# Patient Record
Sex: Female | Born: 1935 | Race: Black or African American | Hispanic: No | Marital: Married | Smoking: Former smoker
Health system: Southern US, Community
[De-identification: ages and names within clinical notes are randomized; demographics above are authoritative.]

## PROBLEM LIST (undated history)

## (undated) DIAGNOSIS — Z9289 Personal history of other medical treatment: Secondary | ICD-10-CM

## (undated) DIAGNOSIS — E119 Type 2 diabetes mellitus without complications: Secondary | ICD-10-CM

## (undated) DIAGNOSIS — T7841XA Arthus phenomenon, initial encounter: Secondary | ICD-10-CM

## (undated) DIAGNOSIS — E039 Hypothyroidism, unspecified: Secondary | ICD-10-CM

## (undated) DIAGNOSIS — H9190 Unspecified hearing loss, unspecified ear: Secondary | ICD-10-CM

## (undated) DIAGNOSIS — M179 Osteoarthritis of knee, unspecified: Secondary | ICD-10-CM

## (undated) DIAGNOSIS — J45909 Unspecified asthma, uncomplicated: Secondary | ICD-10-CM

## (undated) DIAGNOSIS — I10 Essential (primary) hypertension: Secondary | ICD-10-CM

## (undated) DIAGNOSIS — I8390 Asymptomatic varicose veins of unspecified lower extremity: Secondary | ICD-10-CM

## (undated) DIAGNOSIS — M1990 Unspecified osteoarthritis, unspecified site: Secondary | ICD-10-CM

## (undated) DIAGNOSIS — N200 Calculus of kidney: Secondary | ICD-10-CM

## (undated) DIAGNOSIS — M1710 Unilateral primary osteoarthritis, unspecified knee: Secondary | ICD-10-CM

## (undated) DIAGNOSIS — E785 Hyperlipidemia, unspecified: Secondary | ICD-10-CM

## (undated) DIAGNOSIS — J309 Allergic rhinitis, unspecified: Secondary | ICD-10-CM

## (undated) HISTORY — PX: WISDOM TOOTH EXTRACTION: SHX21

## (undated) HISTORY — PX: CATARACT EXTRACTION: SUR2

## (undated) HISTORY — DX: Hypothyroidism, unspecified: E03.9

## (undated) HISTORY — PX: KIDNEY STONE SURGERY: SHX686

## (undated) HISTORY — DX: Hyperlipidemia, unspecified: E78.5

## (undated) HISTORY — DX: Unilateral primary osteoarthritis, unspecified knee: M17.10

## (undated) HISTORY — PX: COLONOSCOPY: SHX174

## (undated) HISTORY — PX: REPLACEMENT TOTAL KNEE: SUR1224

## (undated) HISTORY — DX: Unspecified asthma, uncomplicated: J45.909

## (undated) HISTORY — PX: TONSILLECTOMY: SUR1361

## (undated) HISTORY — PX: ABDOMINAL HYSTERECTOMY: SHX81

## (undated) HISTORY — DX: Essential (primary) hypertension: I10

## (undated) HISTORY — DX: Unspecified osteoarthritis, unspecified site: M19.90

## (undated) HISTORY — DX: Type 2 diabetes mellitus without complications: E11.9

## (undated) HISTORY — DX: Allergic rhinitis, unspecified: J30.9

## (undated) HISTORY — PX: APPENDECTOMY: SHX54

## (undated) HISTORY — DX: Osteoarthritis of knee, unspecified: M17.9

## (undated) HISTORY — DX: Calculus of kidney: N20.0

## (undated) HISTORY — DX: Arthus phenomenon, initial encounter: T78.41XA

---

## 1999-09-20 ENCOUNTER — Encounter: Payer: Self-pay | Admitting: Emergency Medicine

## 1999-09-20 ENCOUNTER — Emergency Department (HOSPITAL_COMMUNITY): Admission: EM | Admit: 1999-09-20 | Discharge: 1999-09-20 | Payer: Self-pay | Admitting: Emergency Medicine

## 2002-09-24 ENCOUNTER — Other Ambulatory Visit: Admission: RE | Admit: 2002-09-24 | Discharge: 2002-09-24 | Payer: Self-pay | Admitting: Internal Medicine

## 2002-11-05 ENCOUNTER — Ambulatory Visit (HOSPITAL_COMMUNITY): Admission: RE | Admit: 2002-11-05 | Discharge: 2002-11-05 | Payer: Self-pay | Admitting: Internal Medicine

## 2002-11-05 ENCOUNTER — Encounter: Payer: Self-pay | Admitting: Internal Medicine

## 2005-01-11 ENCOUNTER — Ambulatory Visit: Payer: Self-pay | Admitting: Internal Medicine

## 2005-02-26 ENCOUNTER — Ambulatory Visit: Payer: Self-pay | Admitting: Internal Medicine

## 2005-03-04 ENCOUNTER — Ambulatory Visit: Payer: Self-pay | Admitting: Internal Medicine

## 2005-04-01 ENCOUNTER — Ambulatory Visit: Payer: Self-pay | Admitting: Pulmonary Disease

## 2005-04-01 ENCOUNTER — Ambulatory Visit: Payer: Self-pay | Admitting: Internal Medicine

## 2005-05-04 ENCOUNTER — Ambulatory Visit: Payer: Self-pay | Admitting: Physical Medicine & Rehabilitation

## 2005-05-04 ENCOUNTER — Inpatient Hospital Stay (HOSPITAL_COMMUNITY): Admission: RE | Admit: 2005-05-04 | Discharge: 2005-05-07 | Payer: Self-pay | Admitting: Orthopedic Surgery

## 2005-09-17 ENCOUNTER — Ambulatory Visit: Payer: Self-pay | Admitting: Internal Medicine

## 2005-11-06 ENCOUNTER — Ambulatory Visit: Payer: Self-pay | Admitting: Family Medicine

## 2006-01-14 ENCOUNTER — Ambulatory Visit: Payer: Self-pay | Admitting: Internal Medicine

## 2006-01-21 ENCOUNTER — Ambulatory Visit: Payer: Self-pay | Admitting: Internal Medicine

## 2006-04-14 ENCOUNTER — Ambulatory Visit: Payer: Self-pay | Admitting: Internal Medicine

## 2006-07-21 ENCOUNTER — Ambulatory Visit: Payer: Self-pay | Admitting: Internal Medicine

## 2007-02-13 ENCOUNTER — Ambulatory Visit: Payer: Self-pay | Admitting: Internal Medicine

## 2007-02-28 ENCOUNTER — Encounter: Payer: Self-pay | Admitting: *Deleted

## 2007-02-28 DIAGNOSIS — E785 Hyperlipidemia, unspecified: Secondary | ICD-10-CM | POA: Insufficient documentation

## 2007-02-28 DIAGNOSIS — M1710 Unilateral primary osteoarthritis, unspecified knee: Secondary | ICD-10-CM | POA: Insufficient documentation

## 2007-02-28 DIAGNOSIS — E039 Hypothyroidism, unspecified: Secondary | ICD-10-CM | POA: Insufficient documentation

## 2007-02-28 DIAGNOSIS — IMO0002 Reserved for concepts with insufficient information to code with codable children: Secondary | ICD-10-CM | POA: Insufficient documentation

## 2007-02-28 DIAGNOSIS — E119 Type 2 diabetes mellitus without complications: Secondary | ICD-10-CM | POA: Insufficient documentation

## 2007-05-05 ENCOUNTER — Ambulatory Visit: Payer: Self-pay | Admitting: Internal Medicine

## 2007-05-05 DIAGNOSIS — T7841XA Arthus phenomenon, initial encounter: Secondary | ICD-10-CM | POA: Insufficient documentation

## 2007-05-05 DIAGNOSIS — I10 Essential (primary) hypertension: Secondary | ICD-10-CM

## 2007-05-05 LAB — CONVERTED CEMR LAB
ALT: 11 units/L (ref 0–35)
AST: 14 units/L (ref 0–37)
BUN: 21 mg/dL (ref 6–23)
CO2: 28 meq/L (ref 19–32)
Calcium: 9.6 mg/dL (ref 8.4–10.5)
Chloride: 103 meq/L (ref 96–112)
Cholesterol: 163 mg/dL (ref 0–200)
Creatinine, Ser: 0.9 mg/dL (ref 0.4–1.2)
GFR calc Af Amer: 79 mL/min
GFR calc non Af Amer: 65 mL/min
Glucose, Bld: 123 mg/dL — ABNORMAL HIGH (ref 70–99)
HDL: 44.4 mg/dL (ref >39.0)
Hgb A1c MFr Bld: 7 % — ABNORMAL HIGH (ref 4.6–6.0)
LDL Cholesterol: 100 mg/dL — ABNORMAL HIGH (ref 0–99)
Potassium: 4.8 meq/L (ref 3.5–5.1)
Sodium: 140 meq/L (ref 135–145)
TSH: 0.54 microintl units/mL (ref 0.35–5.50)
Total CHOL/HDL Ratio: 3.7
Triglycerides: 92 mg/dL (ref 0–149)
VLDL: 18 mg/dL (ref 0–40)

## 2007-05-08 ENCOUNTER — Encounter: Payer: Self-pay | Admitting: Internal Medicine

## 2008-02-20 ENCOUNTER — Encounter: Payer: Self-pay | Admitting: Internal Medicine

## 2008-02-21 ENCOUNTER — Ambulatory Visit: Payer: Self-pay | Admitting: Internal Medicine

## 2008-02-21 LAB — CONVERTED CEMR LAB
ALT: 9 units/L (ref 0–35)
AST: 16 units/L (ref 0–37)
Albumin: 3.8 g/dL (ref 3.5–5.2)
Alkaline Phosphatase: 62 units/L (ref 39–117)
BUN: 14 mg/dL (ref 6–23)
Bilirubin, Direct: 0.2 mg/dL (ref 0.0–0.3)
CO2: 28 meq/L (ref 19–32)
Calcium: 9.3 mg/dL (ref 8.4–10.5)
Chloride: 110 meq/L (ref 96–112)
Cholesterol: 151 mg/dL (ref 0–200)
Creatinine, Ser: 0.8 mg/dL (ref 0.4–1.2)
GFR calc Af Amer: 90 mL/min
GFR calc non Af Amer: 75 mL/min
Glucose, Bld: 186 mg/dL — ABNORMAL HIGH (ref 70–99)
HDL: 45.2 mg/dL (ref >39.0)
Hgb A1c MFr Bld: 6.9 % — ABNORMAL HIGH (ref 4.6–6.0)
LDL Cholesterol: 88 mg/dL (ref 0–99)
Potassium: 4.7 meq/L (ref 3.5–5.1)
Sodium: 142 meq/L (ref 135–145)
TSH: 0.37 microintl units/mL (ref 0.35–5.50)
Total Bilirubin: 0.8 mg/dL (ref 0.3–1.2)
Total CHOL/HDL Ratio: 3.3
Total Protein: 7.1 g/dL (ref 6.0–8.3)
Triglycerides: 91 mg/dL (ref 0–149)
VLDL: 18 mg/dL (ref 0–40)

## 2008-02-22 ENCOUNTER — Encounter: Payer: Self-pay | Admitting: Internal Medicine

## 2008-05-07 ENCOUNTER — Inpatient Hospital Stay (HOSPITAL_COMMUNITY): Admission: RE | Admit: 2008-05-07 | Discharge: 2008-05-11 | Payer: Self-pay | Admitting: Orthopedic Surgery

## 2009-01-07 ENCOUNTER — Telehealth: Payer: Self-pay | Admitting: Internal Medicine

## 2009-01-09 ENCOUNTER — Ambulatory Visit: Payer: Self-pay | Admitting: Internal Medicine

## 2009-01-09 DIAGNOSIS — H9319 Tinnitus, unspecified ear: Secondary | ICD-10-CM | POA: Insufficient documentation

## 2009-01-10 ENCOUNTER — Telehealth: Payer: Self-pay | Admitting: Internal Medicine

## 2009-01-30 ENCOUNTER — Encounter: Payer: Self-pay | Admitting: Internal Medicine

## 2009-02-25 ENCOUNTER — Ambulatory Visit: Payer: Self-pay | Admitting: Internal Medicine

## 2009-02-25 LAB — CONVERTED CEMR LAB
ALT: 9 units/L (ref 0–35)
AST: 13 units/L (ref 0–37)
Albumin: 3.8 g/dL (ref 3.5–5.2)
Alkaline Phosphatase: 62 units/L (ref 39–117)
BUN: 18 mg/dL (ref 6–23)
Basophils Absolute: 0 10*3/uL (ref 0.0–0.1)
Basophils Relative: 0.4 % (ref 0.0–3.0)
Bilirubin, Direct: 0.1 mg/dL (ref 0.0–0.3)
CO2: 30 meq/L (ref 19–32)
Calcium: 9.3 mg/dL (ref 8.4–10.5)
Chloride: 107 meq/L (ref 96–112)
Cholesterol: 151 mg/dL (ref 0–200)
Creatinine, Ser: 0.8 mg/dL (ref 0.4–1.2)
Eosinophils Absolute: 0.3 10*3/uL (ref 0.0–0.7)
Eosinophils Relative: 3.6 % (ref 0.0–5.0)
GFR calc non Af Amer: 89.92 mL/min (ref >60)
Glucose, Bld: 131 mg/dL — ABNORMAL HIGH (ref 70–99)
HCT: 38.7 % (ref 36.0–46.0)
HDL: 49.9 mg/dL (ref >39.00)
Hemoglobin: 13 g/dL (ref 12.0–15.0)
Hgb A1c MFr Bld: 6.9 % — ABNORMAL HIGH (ref 4.6–6.5)
LDL Cholesterol: 88 mg/dL (ref 0–99)
Lymphocytes Relative: 36.1 % (ref 12.0–46.0)
Lymphs Abs: 2.8 10*3/uL (ref 0.7–4.0)
MCHC: 33.5 g/dL (ref 30.0–36.0)
MCV: 90.1 fL (ref 78.0–100.0)
Monocytes Absolute: 0.4 10*3/uL (ref 0.1–1.0)
Monocytes Relative: 5.6 % (ref 3.0–12.0)
Neutro Abs: 4.3 10*3/uL (ref 1.4–7.7)
Neutrophils Relative %: 54.3 % (ref 43.0–77.0)
Platelets: 279 10*3/uL (ref 150.0–400.0)
Potassium: 4.4 meq/L (ref 3.5–5.1)
RBC: 4.29 M/uL (ref 3.87–5.11)
RDW: 13.2 % (ref 11.5–14.6)
Sodium: 142 meq/L (ref 135–145)
TSH: 0.67 microintl units/mL (ref 0.35–5.50)
Total Bilirubin: 0.8 mg/dL (ref 0.3–1.2)
Total CHOL/HDL Ratio: 3
Total Protein: 7.5 g/dL (ref 6.0–8.3)
Triglycerides: 66 mg/dL (ref 0.0–149.0)
VLDL: 13.2 mg/dL (ref 0.0–40.0)
WBC: 7.8 10*3/uL (ref 4.5–10.5)

## 2009-02-26 ENCOUNTER — Encounter: Payer: Self-pay | Admitting: Internal Medicine

## 2009-05-12 ENCOUNTER — Encounter: Payer: Self-pay | Admitting: Internal Medicine

## 2009-06-02 ENCOUNTER — Encounter: Payer: Self-pay | Admitting: Internal Medicine

## 2009-09-03 ENCOUNTER — Encounter: Payer: Self-pay | Admitting: Internal Medicine

## 2010-03-02 ENCOUNTER — Ambulatory Visit: Payer: Self-pay | Admitting: Internal Medicine

## 2010-03-02 ENCOUNTER — Encounter: Payer: Self-pay | Admitting: Internal Medicine

## 2010-04-07 ENCOUNTER — Telehealth: Payer: Self-pay | Admitting: Internal Medicine

## 2010-04-09 ENCOUNTER — Ambulatory Visit: Payer: Self-pay | Admitting: Internal Medicine

## 2010-06-17 ENCOUNTER — Encounter: Payer: Self-pay | Admitting: Internal Medicine

## 2010-06-23 ENCOUNTER — Telehealth: Payer: Self-pay | Admitting: Internal Medicine

## 2010-06-28 LAB — CONVERTED CEMR LAB
ALT: 8 units/L (ref 0–35)
AST: 16 units/L (ref 0–37)
Albumin: 4.1 g/dL (ref 3.5–5.2)
Alkaline Phosphatase: 69 units/L (ref 39–117)
BUN: 16 mg/dL (ref 6–23)
Bilirubin, Direct: 0.1 mg/dL (ref 0.0–0.3)
CO2: 31 meq/L (ref 19–32)
Calcium: 9.6 mg/dL (ref 8.4–10.5)
Chloride: 101 meq/L (ref 96–112)
Cholesterol: 186 mg/dL (ref 0–200)
Creatinine, Ser: 0.8 mg/dL (ref 0.4–1.2)
GFR calc non Af Amer: 85.95 mL/min (ref >60)
Glucose, Bld: 113 mg/dL — ABNORMAL HIGH (ref 70–99)
HDL: 54.8 mg/dL (ref >39.00)
Hgb A1c MFr Bld: 7.3 % — ABNORMAL HIGH (ref 4.6–6.5)
LDL Cholesterol: 111 mg/dL — ABNORMAL HIGH (ref 0–99)
Potassium: 5.8 meq/L — ABNORMAL HIGH (ref 3.5–5.1)
Sodium: 140 meq/L (ref 135–145)
TSH: 0.39 microintl units/mL (ref 0.35–5.50)
Total Bilirubin: 0.9 mg/dL (ref 0.3–1.2)
Total CHOL/HDL Ratio: 3
Total Protein: 7.3 g/dL (ref 6.0–8.3)
Triglycerides: 100 mg/dL (ref 0.0–149.0)
VLDL: 20 mg/dL (ref 0.0–40.0)

## 2010-07-02 NOTE — Assessment & Plan Note (Signed)
Summary: YEARLY MEDICARE FU / LABS AFTER/NWS  #   Vital Signs:  Patient profile:   77 year old female Height:      66.5 inches Weight:      213 pounds BMI:     33.99 O2 Sat:      97 % on Room air Temp:     97.9 degrees F oral Pulse rate:   72 / minute BP sitting:   144 / 84  (left arm) Cuff size:   regular  Vitals Entered By: Bill Salinas CMA (March 02, 2010 11:31 AM)  O2 Flow:  Room air  Primary Care Provider:  Jacques Navy MD   History of Present Illness: Patient presents for annual wellness exam. she is feeling great with acceptance of the mild aches and pains that come with the territory. Not going to let this slow her down.   She had her mammogram this AM. she is do for labs.   She is very active currently spending a great deal of time with her new great-grandson. She remains totally independent in all ADLs. She manages all her own affairs including check writing and balancing her accounts as well as keeping up with all the grandchildren and two great-grandchildren.   Preventive Screening-Counseling & Management  Alcohol-Tobacco     Alcohol drinks/day: 0     Smoking Status: never  Caffeine-Diet-Exercise     Caffeine use/day: 1 cup per day     Does Patient Exercise: no  Hep-HIV-STD-Contraception     Dental Visit-last 6 months no     Sun Exposure-Excessive: no  Safety-Violence-Falls     Seat Belt Use: yes     Helmet Use: n/a     Firearms in the Home: firearms in the home     Smoke Detectors: yes     Violence in the Home: no risk noted     Sexual Abuse: no     Fall Risk: Low fall risk  Current Medications (verified): 1)  Glyburide 5 Mg  Tabs (Glyburide) .... Once Daily After Meal 2)  Synthroid 100 Mcg  Tabs (Levothyroxine Sodium) .... Once Daily 3)  Glucophage 850 Mg  Tabs (Metformin Hcl) .... Two Times A Day 4)  Pravastatin Sodium 40 Mg  Tabs (Pravastatin Sodium) .... At Bedtime 5)  Tylenol 325 Mg  Tabs (Acetaminophen) .... As Needed 6)  Tums 500 Mg   Chew (Calcium Carbonate Antacid) .... Four Times Daily  Allergies (verified): 1)  ! Pcn  Past History:  Past Medical History: Last updated: March 14, 2008 UNSPECIFIED ESSENTIAL HYPERTENSION (ICD-401.9) ARTHUS PHENOMENON (ICD-995.21) OSTEOARTHRITIS, KNEE (ICD-715.96) HYPERLIPIDEMIA (ICD-272.4) DIABETES MELLITUS, TYPE II (ICD-250.00) HYPOTHYROIDISM (ICD-244.9)      Physician roster:                  Ortho - Dr. 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 marriage in good health end-of-life: No cardiac resuscitation, no mechanical ventilation, no futile or heroic measures.   Risk Factors: Alcohol Use: 0 (03/02/2010) Caffeine Use: 1 cup per day (03/02/2010) Diet: heart healthy diet (02/25/2009) Exercise: no (03/02/2010)  Risk Factors: Smoking Status: never (03/02/2010)  Social History: Caffeine use/day:  1 cup per day Does Patient Exercise:  no Dental Care w/in 6 mos.:  no Sun Exposure-Excessive:  no Seat Belt Use:  yes Fall Risk:  Low fall risk  Review of Systems  The patient denies anorexia, fever, weight loss, weight gain, vision loss, decreased hearing, hoarseness, chest pain, dyspnea on exertion, peripheral edema, prolonged cough, hemoptysis, abdominal pain, hematochezia, severe indigestion/heartburn, incontinence, muscle weakness, difficulty walking, depression, abnormal bleeding, enlarged lymph nodes, and breast masses.    Physical Exam  General:  Overweight AA woman in no distress Head:  Normocephalic and atraumatic without  obvious abnormalities. No apparent alopecia or balding. Eyes:  vision grossly intact, pupils equal, pupils round, and corneas and lenses clear.  Fundiscopic exam deferred to Opthal. Ears:  External ear exam shows no significant lesions or deformities.  Otoscopic examination reveals clear canals, tympanic membranes are intact bilaterally without bulging, retraction, inflammation or discharge. Hearing is grossly normal bilaterally. Nose:  no external deformity and no external erythema.   Mouth:  missing many molars, premolars maxilla. No buccal lesions. Tongue normal Posterior pharynx normal Neck:  supple, full ROM, no thyromegaly, and no carotid bruits.   Chest Wall:  No deformities, masses, or tenderness noted. Breasts:  deferred to todays mammogram Lungs:  Normal respiratory effort, chest expands symmetrically. Lungs are clear to auscultation, no crackles or wheezes. Heart:  Normal rate and regular rhythm. S1 and S2 normal without gallop, murmur, click, rub or other extra sounds. Abdomen:  soft, non-tender, normal bowel sounds, no guarding, and no hepatomegaly.   Genitalia:  deferred Msk:  normal ROM, no joint tenderness, no joint swelling, no joint warmth, and no joint deformities.   Pulses:  2+ radial and DP pulses Extremities:  No clubbing, cyanosis, edema, or deformity noted with normal full range of motion of all joints.   Neurologic:  alert & oriented X3, cranial nerves II-XII intact, strength normal in all extremities, gait normal, and DTRs symmetrical and normal.   Skin:  turgor normal, color normal, no suspicious lesions, and no ulcerations.   Cervical Nodes:  no anterior cervical adenopathy and no posterior cervical adenopathy.   Psych:  Oriented X3, normally interactive, good eye contact, and not anxious appearing.    Diabetes Management Exam:    Eye Exam:       Eye Exam done elsewhere          Date: 07/16/2009          Results: normal          Done by: Dr.  Nelle Don   Impression & Recommendations:  Problem # 1:  UNSPECIFIED ESSENTIAL HYPERTENSION (ICD-401.9) Adequate control. For routine lab today.  Plan - continue preent regimen  BP today: 144/84 Prior BP: 132/72 (02/25/2009  Problem # 2:  HYPERLIPIDEMIA (ICD-272.4) For lab today with recommendations to follow.  Her updated medication list for this problem includes:    Pravastatin Sodium 40 Mg Tabs (Pravastatin sodium) .Marland Kitchen... At bedtime  Orders: TLB-Lipid Panel (80061-LIPID) TLB-Hepatic/Liver Function Pnl (80076-HEPATIC)  addendum - LDL 111, above goal of 100 or less. Plan - better dietary adherence to low fat diet  Problem # 3:  DIABETES MELLITUS, TYPE II (ICD-250.00)  Her updated medication list for this problem includes:    Glyburide 5 Mg Tabs (Glyburide) ..... Once daily after meal    Glucophage 850 Mg Tabs (Metformin hcl) .Marland Kitchen..Marland Kitchen Two times a day  Orders: TLB-BMP (Basic Metabolic Panel-BMET) (80048-METABOL)  TLB-A1C / Hgb A1C (Glycohemoglobin) (83036-A1C)  Labs Reviewed: Creat: 0.8 (02/25/2009)     Last Eye Exam: normal (07/16/2009) Reviewed HgBA1c results: 6.9 (02/25/2009)  6.9 (02/21/2008)  For follow up lab with recommendations to follow. She has been previously well controlled.  addendum - A1C 7.3% - OK   Problem # 4:  OSTEOARTHRITIS, KNEE (ICD-715.96) Has had orthopedic intervention left knee and remains tender. She is not limited in her activities.   The following medications were removed from the medication list:    Celebrex 100 Mg Caps (Celecoxib) .Marland Kitchen... Take one pill once each day Her updated medication list for this problem includes:    Tylenol 325 Mg Tabs (Acetaminophen) .Marland Kitchen... As needed  Problem # 5:  Preventive Health Care (ICD-V70.0)  Unremarkable history. Normal physical exam. Lab      Current with mammography, lost colonoscopy '04. Immunizations - Tetnus '10, Pneumonia vaccine '10, flu today.   Ms. Rudden has no signs of symptoms of depression. She  has normal cognition. She has minimal increased risk of fall due to OA knee but does exercise caution. She is counseled on weight management and continued adherence to medical regimen.  In summary -a very nice woman who is medically stable. She will return in 1 year or as needed.  Orders: Medicare -1st Annual Wellness Visit [phone])  Complete Medication List: 1)  Glyburide 5 Mg Tabs (Glyburide) .... Once daily after meal 2)  Synthroid 100 Mcg Tabs (Levothyroxine sodium) .... Once daily 3)  Glucophage 850 Mg Tabs (Metformin hcl) .... Two times a day 4)  Pravastatin Sodium 40 Mg Tabs (Pravastatin sodium) .... At bedtime 5)  Tylenol 325 Mg Tabs (Acetaminophen) .... As needed 6)  Tums 500 Mg Chew (Calcium carbonate antacid) .... Four times daily  Other Orders: TLB-TSH (Thyroid Stimulating Hormone) (60454-UJW)  Patient: Michelle Morton Note: All result statuses are Final unless otherwise noted.  Tests: (1) Lipid Panel (LIPID)   Cholesterol               186 mg/dL                   1-191     ATP III Classification            Desirable:  < 200 mg/dL                    Borderline High:  200 - 239 mg/dL               High:  > = 240 mg/dL   Triglycerides             100.0 mg/dL                 4.7-829.5     Normal:  <150 mg/dL     Borderline High:  621 - 199 mg/dL   HDL                       30.86 mg/dL                 >57.84   VLDL Cholesterol          20.0 mg/dL                  6.9-62.9   LDL Cholesterol      [H]  528 mg/dL                   4-13  CHO/HDL Ratio:  CHD Risk                             3                    Men          Women     1/2 Average Risk     3.4          3.3     Average Risk          5.0          4.4     2X Average Risk          9.6          7.1     3X Average Risk          15.0          11.0                           Tests: (2) Hepatic/Liver Function Panel (HEPATIC)   Total Bilirubin           0.9 mg/dL                   1.6-1.0   Direct Bilirubin          0.1  mg/dL                   9.6-0.4   Alkaline Phosphatase      69 U/L                      39-117   AST                       16 U/L                      0-37   ALT                       8 U/L                       0-35   Total Protein             7.3 g/dL                    5.4-0.9   Albumin                   4.1 g/dL                    8.1-1.9  Tests: (3) BMP (METABOL)   Sodium                    140 mEq/L                   135-145   Potassium            [H]  5.8 mEq/L                   3.5-5.1   Chloride                  101 mEq/L  96-112   Carbon Dioxide            31 mEq/L                    19-32   Glucose              [H]  113 mg/dL                   16-10   BUN                       16 mg/dL                    9-60   Creatinine                0.8 mg/dL                   4.5-4.0   Calcium                   9.6 mg/dL                   9.8-11.9   GFR                       85.95 mL/min                >60  Tests: (4) Hemoglobin A1C (A1C)   Hemoglobin A1C       [H]  7.3 %                       4.6-6.5     Glycemic Control Guidelines for People with Diabetes:     Non Diabetic:  <6%     Goal of Therapy: <7%     Additional Action Suggested:  >8%   Tests: (5) TSH (TSH)   FastTSH                   0.39 uIU/mL                 0.35-5.50

## 2010-07-02 NOTE — Progress Notes (Signed)
Summary: FOOT INJURY  Phone Note Call from Patient   Summary of Call: 2 wks ago pt hurt middle toe on right foot. C/o swelling and she soaked foot in epsom salts. She went to [facility] when she hit foot again. Xray was normal and they advised pt to continue to soak foot. She is worried b/c she is diabetic. Pt continues to c/o pain. She wants to know if she should see foot specialist? or Dr Debby Bud? Initial call taken by: Lamar Sprinkles, CMA,  April 07, 2010 4:08 PM  Follow-up for Phone Call        OV with me Follow-up by: Jacques Navy MD,  April 07, 2010 5:54 PM  Additional Follow-up for Phone Call Additional follow up Details #1::        appointment scheduled for 04/09/10 9:00am Additional Follow-up by: Brenton Grills CMA Duncan Dull),  April 08, 2010 10:06 AM

## 2010-07-02 NOTE — Assessment & Plan Note (Signed)
Summary: PER Michelle Morton SCHED PER SARAH-DATE--SWOLLEN TOE--STC   Vital Signs:  Patient profile:   77 year old female Height:      66.5 inches Weight:      216 pounds BMI:     34.47 O2 Sat:      97 % on Room air Temp:     97.9 degrees F oral Pulse rate:   90 / minute BP sitting:   130 / 72  (left arm) Cuff size:   regular  Vitals Entered By: Bill Salinas CMA (April 09, 2010 9:13 AM)  O2 Flow:  Room air CC: pt here with c/o soreness after hitting toe on her right foot/ ab   Primary Care Provider:  Jacques Navy MD  CC:  pt here with c/o soreness after hitting toe on her right foot/ ab.  History of Present Illness: Michelle Morton today for pain in her right middle toes after hitting it two times.  She states that about 3 weeks ago she hit her middle right toe when getting up from bed.  She was in severe pain and took some tylenol which eased the pain enough to go to sleep.  In the morning she awoke and noticed her toe was very swollen.  She soaked it in Epsom Salts whiched help decrease some of the swelling.  However, 4 days later she hit the same toe and again was in severe pain.  She went to Urgent Care that day.  X-ray was obtained and showed no break.  SHe was advised to buddy tape her toes and continue pain management with tylenol.  She said the buddy tape was very uncomfortable so she only occasionally did that at night.  Today the pain has almost subsided with tylenol but she still notices swelling and some pain when she walks.  She denies any redness around the toe, fevers, or chills.  She is worried that with her history of diabetes and how long the toe has been swollen that she could lose her foot.  Current Medications (verified): 1)  Glyburide 5 Mg  Tabs (Glyburide) .... Once Daily After Meal 2)  Synthroid 100 Mcg  Tabs (Levothyroxine Sodium) .... Once Daily 3)  Glucophage 850 Mg  Tabs (Metformin Hcl) .... Two Times A Day 4)  Pravastatin Sodium 40 Mg  Tabs (Pravastatin  Sodium) .... At Bedtime 5)  Tylenol 325 Mg  Tabs (Acetaminophen) .... As Needed 6)  Tums 500 Mg  Chew (Calcium Carbonate Antacid) .... Four Times Daily  Allergies (verified): 1)  ! Pcn PMH-FH-SH reviewed-no changes except otherwise noted  Review of Systems  The patient denies fever, chest pain, syncope, headaches, muscle weakness, difficulty walking, abnormal bleeding, and enlarged lymph nodes.    Physical Exam  General:  alert, well-developed, and well-nourished.   Head:  normocephalic and atraumatic.   Lungs:  normal respiratory effort, normal breath sounds, no crackles, and no wheezes.   Heart:  normal rate, regular rhythm, no murmur, no gallop, and no rub.   Msk:  normal ROM, no joint warmth, no redness over joints, no joint deformities, no joint instability, and no crepitation.  Tenderness and swelling around the middle toe on the right foot.  Pain with passive extension of toe. Pulses:  2+ DP and PT pulses bilaterally Extremities:  Swelling of the right middle toe.  Good capilary refill.  Normal sensation.  No open wound noted.  No ulcers. Neurologic:  alert & oriented X3, sensation intact to light touch, sensation intact  to pinprick, and gait normal.   Skin:  turgor normal and color normal.   Psych:  Oriented X3, memory intact for recent and remote, normally interactive, good eye contact, not anxious appearing, and not depressed appearing.     Impression & Recommendations:  Problem # 1:  TOE PAIN (ICD-729.5) With [name]'s history of trauma to the toe and the physical exam it seems as though she just stubbed her toe and has some swelling.  SHe does not have any signs of infection or gangreen that would cause her to have to loose the toe.    Plan:  Continue Tylenol for pain           Ice the toe for to help decrease swelling           Wear Rocker shoe when walking     Complete Medication List: 1)  Glyburide 5 Mg Tabs (Glyburide) .... Once daily after meal 2)   Synthroid 100 Mcg Tabs (Levothyroxine sodium) .... Once daily 3)  Glucophage 850 Mg Tabs (Metformin hcl) .... Two times a day 4)  Pravastatin Sodium 40 Mg Tabs (Pravastatin sodium) .... At bedtime 5)  Tylenol 325 Mg Tabs (Acetaminophen) .... As needed 6)  Tums 500 Mg Chew (Calcium carbonate antacid) .... Four times daily   Orders Added: 1)  Est. Patient Level III [16109]   Immunization History:  Influenza Immunization History:    Influenza:  historical (03/28/2010)   Immunization History:  Influenza Immunization History:    Influenza:  Historical (03/28/2010)

## 2010-07-02 NOTE — Letter (Signed)
Summary: [facility]   Imported By: Sherian Rein 06/25/2010 12:50:05  _____________________________________________________________________  External Attachment:    Type:   Image     Comment:   External Document

## 2010-07-02 NOTE — Letter (Signed)
Summary: [facility]  [facility]   Imported By: Sherian Rein 06/04/2009 14:19:23  _____________________________________________________________________  External Attachment:    Type:   Image     Comment:   External Document

## 2010-07-02 NOTE — Consult Note (Signed)
Summary: [facility]  [facility]   Imported By: Lester [place] 06/09/2009 09:28:57  _____________________________________________________________________  External Attachment:    Type:   Image     Comment:   External Document

## 2010-07-02 NOTE — Progress Notes (Signed)
       Diabetes Management Exam:    Eye Exam:       Eye Exam done elsewhere          Date: 06/17/2010          Results: normal          Done by: Dr Jearld Adjutant

## 2010-07-03 NOTE — Medication Information (Signed)
Summary: Pernell Dupre MD/[facility]  [place] MD/[facility]   Imported By: Lester [place] 09/05/2009 09:26:31  _____________________________________________________________________  External Attachment:    Type:   Image     Comment:   External Document

## 2010-10-13 NOTE — Op Note (Signed)
NAMEALECE, KOPPEL                  ACCOUNT NO.:  [id number]   MEDICAL RECORD NO.:  [id number]          PATIENT TYPE:  INP   LOCATION:  NA                           FACILITY:  [facility]   PHYSICIAN:  Deidre Ala, M.D.    DATE OF BIRTH:  [birth year]   DATE OF PROCEDURE:  05/07/2008  DATE OF DISCHARGE:                               OPERATIVE REPORT   PREOPERATIVE DIAGNOSIS:  End-stage degenerative joint disease, right  knee bone on bone with varus.   POSTOPERATIVE DIAGNOSIS:  End-stage degenerative joint disease, right  knee bone on bone with varus, with intraoperative compromise of the  medial collateral ligament.   PROCEDURE:  1. Right total knee arthroplasty using the DePuy TC3 femoral      component, femoral MBT stem cemented with rotating platform with      post.  2. Repair of medial collateral ligament.   SURGEON:  1. Charlesetta Shanks, M.D.   ASSISTANT:  Phineas Semen, PA-C.   ANESTHESIA:  General with endotracheal and femoral nerve block.   CULTURES:  None.   DRAINS:  2 medium Hemovacs to self suction.   TOURNIQUET TIME:  93 minutes.   ESTIMATED BLOOD LOSS:  Minimal.   COMPLICATIONS:  Intraoperative compromise of the MCL with primary repair  and change to TC3 femoral component to assure stability with result  maximal stability obtained.  TC3 without stem with full extension with  rock stable medial collateral ligament throughout range of motion.   PATHOLOGIC FINDINGS AND HISTORY:  Mirha underwent a successful left  total knee arthroplasty on May 04, 2005, approximately 3 years ago.  She had DePuy components LCS type with a short stem and had no problems  with it.  She is now with bone on bone with varus.  At surgery she had  marked tricompartmental disease.  We tried to match her components with  a standard right femur, a 12.5 rotating platform and #3 tibial tray, but  we did end up going with tibial MBT revision long stem because of her  relative high body mass  index and her varus producing a tendency to fall  into varus on the tibial component.  Granted the other side was doing  well, but the long tibial stem in my hands, has worked better for this.  In any case when checking the flexion gap, we fit a 10 tightly and I  wanted to go to a 12.5 to match the other side, the femur being equal.  When I made this cut, I realized I had come across the medial collateral  ligament.  I did tear repair with a series of #1 Ethibond sutures which  did stabilize it well; however, I felt her rehab would be compromised  and that she would do better getting the restraint of a TC3 for the  collateral ligaments and so went to that on the femoral side and  achieved rock-solid stability with both the repair and the TC3 with full  extension, flexion to 100 degrees with stability of the prosthesis  throughout with a 15-mm tibial component with  post.  I also used a size  4 MBT revision tray, a Sigma RP TC3  size 3 with the 15 mm poly and a 75  x 16 universal cemented stem.  Oval dome patella was three peg 35 mm.   PROCEDURE:  With adequate anesthesia obtained using femoral nerve block  and endotracheal technique, the patient was placed in the supine  position.  Right lower extremity was prepped from the toes to the  tourniquet in standard fashion.  After standard prepping and draping,  Esmarch examination was used.  The tourniquet was let up to 375 mmHg.  Median parapatellar skin incision was followed by median parapatellar  retinacular incision.  Incision was deepened sharply with a knife and  hemostasis obtained using Bovie electrocoagulator.  The patella was  everted, fat pad excised as well as both cruciates and the menisci.  She  was tighter laterally than medially and I did some medial release.  I  then exposed the proximal tibia and amputated the tibial spine.  I then  placed an intermedullary reaming guide down the canal with slight varus  to effect leg valgus  and reamed up to a 16.  I then placed the tibial  cutting jig in place and made the cut on the 6 level.  I then sized the  femur to a standard, placed intermedullary guide, placed the 10-mm C  clamp and set the rotation.  We then moved it up 2.5 mm so as not to  notch, made the anterior and posterior cuts and sized to a 10 which was  a bit tight.  I wanted to go up to 12.5, so I put the tibial cutting jig  in place 0 degrees, 2.5 mm down, made that cut, then trialed the 12.5  and felt the medial side to be loose.  I then looked and felt that the  medial collateral ligament had been compromised.  I placed 4 figure-of-  eight sutures in it and it balanced it back and tightened it for 15.  At  this point, the other side stretching out a bit so I ended up then  deciding to go to with a TC3 for extra stability and for quickness of  rehab instead of letting the MCL heal.  At this point we placed the  intermedullary guide reaming up to 16 for the femoral cutting jig distal  femoral cut, placed that off and cut the minimum distal femoral cut for  5 degrees of valgus.  We then placed the box cut for the anterior-  posterior cuts.  After we had recut the anterior-posterior cuts in  slight different rotation which also aided in flexion to loosen up  lateral and leave the medial posterior cut the same.  We then had made  the anterior-posterior chamfer cuts and the box cut on the femur.  I  then exposed the proximal tibia sized to 4, reamed the proximal reamer  and then the stem reamer and then placed the trial in place with the 15,  placed the femoral component on, placed the 15 mm insert with post and  the knee was in full extension with good varus-valgus stability  throughout the range of motion.  I tried the 12.5 felt it that to be  slightly loose so ended up with the 15 ultimately.  Even with that the  ligament stretched a bit more to have it be full extension at closure.  This was with the 15 mm  post with  insert.  All trial components removed.  While thorough jet lavage was carried out, components were brought on  the field to be checked for sizing.  We then cemented the tibial  component after assembling it, impacted it, removed excess cement.  We  then placed the femoral component, impacted it, removed excess cement.  We had previously with the patellar cutting jig,cut the patella down to  accommodate an 35 mm button with a rotating saw and placed those drill  holes.  We then placed the patella component and the 15 mm post.  The  knee was then articulated through a range of motion.  Excessive cement  was removed, the knee was held in full extension while the cement cured  on the patella,  femur and tibia.  When the cement had cured, the  tourniquet was let down.  Bleeding points were cauterized.  Hemovac  drains were placed in the medial and lateral gutter and brought out the  superior lateral portal.  The wound was then closed in layers with #1  Vicryl on the retinaculum with a running locking oversew of #1 PDS, 0  and 2-0 Vicryl in the subcu and skin staples.  We placed gentamicin in  the knee 40 mg in 20 mL of saline into the Hemovac and then clamped it  to let it sit in the knee until the Hemovac is charged in 2 or 3 hours  in the PACU.  Bulky sterile compressive dressing was applied with Ace.  The skin had  been closed with staples.  The patient having tolerated the procedure  well, was awakened, taken to recovery room in satisfactory condition for  routine postoperative care and CPM.      Deidre Ala, M.D.  Electronically Signed     VEP/MEDQ  D:  05/07/2008  T:  05/07/2008  Job:  [id number]   cc:   Rosalyn Gess. Norins, MD  [address]  [place]  [place] [address]

## 2010-10-13 NOTE — Assessment & Plan Note (Signed)
[facility]                           PRIMARY CARE OFFICE NOTE   NAME:Morton, Michelle LEGER                         MRN:          [id number]  DATE:02/13/2007                            DOB:          April 15, 1932    Michelle Morton is a 77 year old African-American woman who presents for  followup evaluation and exam.  She was last seen in the office July 21, 2006.  Last physical exam was January 21, 2006.   The patient reports she is feeling well with no active complaints at  this time.   PAST MEDICAL HISTORY:  1. Surgical tonsillectomy.  2. Appendectomy.  3. Hysterectomy in the 1980's.  4. Breast biopsy in 1988.  5. Vocal cord nodule excised in the 1970's.  6. Left total knee arthroplasty.   MEDICAL HISTORY:  1. Usual childhood disease.  2. Hayfever.  3. Hypothyroid disease.  4. History of nephrolithiasis with stent placement and passage of      stone.  5. History of breast abscesses treated with incision and drainage and      wound care.  6. Diabetes.  7. Hypothyroid disease.  8. Hyperlipidemia.   CURRENT MEDICATIONS:  1. Glyburide 5 mg daily.  2. Synthroid 100 mcg daily.  3. Glucophage 850 mg b.i.d.  4. Pravastatin 40 mg q.p.m.  5. Etodolac 400 mg b.i.d.  6. Advil p.r.n.   FAMILY HISTORY:  Significant for longevity, but no significant  inheritable disease.   SOCIAL HISTORY:  1. The patient is retired.  2. A public Geneticist, molecular.  3. She remains very active.  4. She is involved in the care of her grandchildren.  5. She exercises on a regular basis.  6. She recently took a cruise with her husband which was a Psychologist, sport and exercise.   CHART REVIEW:  1. Last colonoscopy was December 07, 2002, with a repeat study recommended      in 10 years.  2. Last hospitalization was May 04, 2005, for end-stage joint      disease of the left knee with a left total knee arthroplasty.  3. Last mammogram December 16, 2006, was unremarkable.   REVIEW OF  SYSTEMS:  Negative for any constitutional, cardiovascular,  respiratory, GI or GU complaints.   EXAMINATION:  Temperature was 97.4, blood pressure 148/79, pulse 92,  weight 221.  GENERAL APPEARANCE:  This is a heavyset African-American woman who looks  her stated age, in no acute distress.  HEENT EXAM:  Normocephalic, atraumatic, EACs and TMs were normal.  OROPHARYNX:  Without lesions.  CONJUNCTIVA AND SCLERA:  Clear.  PUPILS:  Equal, round and reactive.  NECK:  Supple without thyromegaly.  No S1 adenopathy was noted in the  cervical or supraclavicular regions.  CHEST:  No CVA tenderness.  LUNGS:  Clear to auscultation and percussion.  BREAST EXAM:  Skin was normal, nipples without discharge.  No fixed mass  lesions or abnormality was noted.  She has mild fibrocystic type  changes.  CARDIOVASCULAR:  Radial pulse 2+, no JVD or carotid bruit.  She had a  quiet precordium with a regular rate and rhythm without murmurs, rubs or  gallops.  ABDOMEN:  Soft, no guarding or rebound.  No organosplenomegaly was  noted.  PELVIC AND RECTAL EXAM:  Deferred to age and hysterectomy.  EXTREMITIES:  Without clubbing, cyanosis, edema or deformity.  NEUROLOGIC EXAM:  Nonfocal.   LABORATORY:  Labs were ordered and pending including a lipid panel, A1c,  metabolic panel, ALT, AST and TSH.   ASSESSMENT AND PLAN:  1. Diabetes.  The patient has generally been well controlled.  Last      hemoglobin A1c dates from 2006 and was 5.7%.  Laboratories ordered      and pending.  Will adjust her medications as indicated.  2. Hyperlipidemia.  Patient's last lipid panel from January 14, 2006,      showed an LDL of 102.  Laboratories were ordered and pending.  Will      adjust her medications as indicated.  3. Hypothyroid disease.  Last TSH was 0.98 January 14, 2006.      Laboratories were ordered and pending.  Will adjust her medications      as indicated.  4. Mild arthritic discomfort.  Patient stable on  etodolac.  5. Health maintenance.  Patient is current and up to date with      colorectal cancer screening and breast cancer screening.   In summary, this is a very pleasant woman who seems to be medically  stable at this time.  She will be notified by a phone tree of her lab  results with adjustment in her medication as indicated.     Rosalyn Gess Norins, MD  Electronically Signed    MEN/MedQ  DD: 02/14/2007  DT: 02/14/2007  Job #: [id number]   cc:   Lezlie Lye

## 2010-10-13 NOTE — Discharge Summary (Signed)
[name], Michelle Morton                  ACCOUNT NO.:  [id number]   MEDICAL RECORD NO.:  [id number]          PATIENT TYPE:  INP   LOCATION:  1608                         FACILITY:  [facility]   PHYSICIAN:  Deidre Ala, M.D.    DATE OF BIRTH:  August 21, 1931   DATE OF ADMISSION:  05/07/2008  DATE OF DISCHARGE:  05/11/2008                               DISCHARGE SUMMARY   FINAL DIAGNOSES:  1. End-stage degenerative joint disease, right knee.  2. Postoperative blood-loss anemia.  3. Diabetes mellitus type 2.  4. Hypothyroidism.  5. Hyperlipidemia.   PROCEDURE:  On 05/07/2008, total right knee arthroplasty.   SURGEON:  1. Charlesetta Shanks, M.D.   HISTORY:  This is a 77 year old African American female, long-time  patient of Dr. Renae Fickle, who has been treating her for her right DJD.  She  has since failed conservative management and is now ready to undergo a  total knee arthroplasty.  Subsequently, she has been admitted to [facility].   HOSPITAL COURSE:  Patient is admitted to [facility] on  May 07, 2008.  At that time, she underwent a total right knee  arthroplasty.  Patient tolerated the procedure well.  No intraoperative  complications occurred.   Postoperatively, the patient did well.  She did suffer from postop blood-  loss anemia and became significant enough to require blood transfusion.  On 05/10/2008, her hemoglobin was 7.3.  Hematocrit was 21.9.  At this  point, she was given 2 units of packed red cells.  Otherwise, no other  problems occurred during her stay.  Her pain was managed well.  She  worked with physical therapy.   By the 12th of December, she was ready to be discharged to home.  At  this time, she was able to get out of bed without assistance.  Her  incision was healing well.  There were no signs of any infections.  There was no sign of effusion.  Her peripheral pulses were intact.  Neuro was grossly intact.  No calf pain was noted.  No [name] sign  noted.   Patient was on Lovenox in the hospital.  She will continue on this as an  outpatient for the next 10 days.   DISCHARGE MEDICATIONS:  1. Lovenox 30 mg 1 subcu q.12h. x10 days.  2. Percocet 5/325 1-2 p.o. q.4-6h. p.r.n. pain.  3. Robaxin 750 1 p.o. q.i.d. p.r.n. spasms.  4. Metformin 850 mg 1 p.o. b.i.d.  5. Glyburide 1 p.o. daily.  6. Synthroid 100 mcg daily.  7. Pravastatin 40 mg nightly.   She did have a noted allergy to PENICILLIN, which caused a rash.   She is subsequently ready for discharge, and she will follow up with Dr.  Renae Fickle in 10 days.  She will receive home health for physical therapy.  She is now discharged in satisfactory and stable condition.      Phineas Semen, Wynonia Hazard, M.D.  Electronically Signed    CL/MEDQ  D:  05/11/2008  T:  05/11/2008  Job:  [id number] 

## 2010-10-16 NOTE — Discharge Summary (Signed)
NAMEROBINA, HAMOR                  ACCOUNT NO.:  [id number]   MEDICAL RECORD NO.:  [id number]          PATIENT TYPE:  INP   LOCATION:  1617                         FACILITY:  [facility]   PHYSICIAN:  Deidre Ala, M.D.    DATE OF BIRTH:  04/15/1932   DATE OF ADMISSION:  05/04/2005  DATE OF DISCHARGE:                                 DISCHARGE SUMMARY   ADMISSION DIAGNOSES:  1.  End-stage osteoarthritis of the left knee.  2.  Diabetes mellitus.  3.  Hypothyroidism.  4.  Hypercholesterolemia.  5.  History of allergies.   DISCHARGE DIAGNOSES:  1.  Osteoarthritis of the left knee, status post left total knee      arthroplasty.  2.  Diabetes mellitus.  3.  Hypothyroidism.  4.  Hypercholesterolemia.  5.  Allergies.  6.  Postoperative hemorrhagic anemia, stable at the time of discharge,      status post 2 units of packed red blood cells.   PROCEDURES:  The patient was admitted to [facility] and taken to  the operating room.  She underwent a left total knee arthroplasty, DePuy LCS  rotating platform, by Dr. Charlesetta Shanks, assisted by Clarene Reamer, PA-C.  Surgery was done under general anesthesia, with a femoral nerve block and  Hemovac drain x1 was placed at the time of surgery.   CONSULTATIONS:  Physical therapy, occupational therapy, social work case  management, physical medicine and rehabilitation with Dr. Thomasena Edis.   BRIEF HISTORY:  The patient is a 77 -year-old female with a history of left  knee pain.  She has previously failed conservative treatment up to this  point.  Radiographs in the office revealed bone-on-bone contact in the  medial compartment.  Upon these findings, Dr. Renae Fickle felt it was best to  proceed with a left total knee arthroplasty.  The patient agreed.  The risks  and benefits of the surgery were discussed with the patient and the patient  wished to proceed.   LABORATORY DATA:  CBC on admission showed at hemoglobin of 13.5, hematocrit  41.5, white blood  cell count 8.1, red blood cell count 4.52.  Serial H&H's  were followed throughout hospital stay.  Hemoglobin and hematocrit did  decline to 8.5 and 25 on May 06, 2005, however, were 11.2 and 33.5  status post 2 units of packed red blood cells and stable at the time of  discharge on December 8.  Coagulation studies on admission were all within  normal limits.  PT and INR at the time of discharge were 18.2 and 1.5  respectively, on Coumadin therapy. Routine chemistry on admission showed  glucose low at 59.  Serial chemistries were followed throughout hospital  stay.  Glucose ranged from a low of 59 on admission to a high of 174.  Creatinine was high at 1.3 on December 6 and returned to within normal range  the following day.  Sodium fell to 134 on December 7, but was back into the  normal range the following day.  Urinalysis on admission showed small amount  of bilirubin.  The  patient's blood type was AB positive, antibody screen  negative.  EKG on admission showed normal sinus rhythm with a normal EKG.  Preop chest x-ray revealed no active lung disease.   HOSPITAL COURSE:  The patient was admitted to [facility] and taken  to the operating room.  She underwent the above-stated procedure without  complications.  The patient tolerated the procedure well, and was taken to  the recovery room and the orthopedic floor for continued postoperative care.  Postoperative day #1, the patient was doing well, resting comfortably.  T-  max was 99.9.  Hemoglobin and hematocrit were 9.7 and 29.5.  She was  neurovascularly intact to the left lower extremity and dressing was clean,  dry and intact.  She was to work with physical therapy and occupational  therapy.  She had low urine output, so Lasix 20 mg IV was given, which  increased her urine output.  Postoperative day #2, the patient continued to  do well, with minimal pain.  T-max was 99.4, hemoglobin and hematocrit 8.5  and 25.  Incision  was clean, dry and intact.  She was neurovascularly intact  to the left lower extremity.  The patient was to continue working with  physical therapy and occupational therapy.  Hemovac, Foley and PCA were  discontinued on this day.  Her IV had come out, which was restarted to  transfuse 2 units of packed red blood cells.  She was to continue working  with physical therapy and then finally, on postoperative day #3, December 8,  patient was doing well, resting comfortably.  Hemoglobin and hematocrit were  11.2 and 33.5 status post 2 units of packed red blood cells.  She was  progressing well in therapy and incision was clean, dry and intact.  She was  neurovascularly intact to the left lower extremity.  She was going to work  with physical therapy again twice today to see how her progress did, for the  possibility of being discharged home today.   DISPOSITION:  The patient was discharged home on either December 8 or  May 08, 2005.   DISCHARGE MEDICATIONS:  1.  Mepergan Fortis 1-2 p.o. q.4-6h p.r.n. pain.  2.  Robaxin 500 mg 1 p.o. q.6-8h p.r.n. spasm.  3.  Coumadin per pharmacy protocol.   DIET:  The patient was to resume her previous diabetic diet.   ACTIVITY:  The patient was weightbearing as tolerated to the left lower  extremity.   WOUND CARE:  The patient is to perform daily dressing changes until no  drainage.  She may shower when no drainage.   FOLLOW UP:  The patient is to follow up with Dr. Renae Fickle 2 weeks from the date  of surgery.  She is to call the office for an appointment at [phone].   CONDITION ON DISCHARGE:  Stable and improved.     ______________________________  Clarene Reamer, P.A.-C.    ______________________________  V. Charlesetta Shanks, M.D.    SW/MEDQ  D:  05/07/2005  T:  05/07/2005  Job:  [id number]

## 2010-10-16 NOTE — Assessment & Plan Note (Signed)
[facility]                             PRIMARY CARE OFFICE NOTE   NAME:Bellot, ALESSIA GONSALEZ                         MRN:          [id number]  DATE:01/26/2006                            DOB:          05-25-1932    TELEPHONE NOTE:  Ms. Fedorchak was seen last week.  She was complaining at that  time of having a stain on her shorts and underwear.  She had a urinalysis at  that time which appeared to be a contaminated UA with multiple epithelial  cells and scant bacteria.   The patient called today to report she is having a greenish stain on the  front of her underwear when she wakes up in the morning.  She feels this is  a vaginal discharge.  She has had no significant odor and no pruritus.   ASSESSMENT AND PLAN:  Probable bacterial vaginosis.  Will treat it with  Flagyl 500 mg b.i.d. for 7 days.  Prescription called to [facility] on [address].                                   Rosalyn Gess Norins, MD   MEN/MedQ  DD:  01/26/2006  DT:  01/27/2006  Job #:  [id number]

## 2010-10-16 NOTE — Assessment & Plan Note (Signed)
[facility]                             PRIMARY CARE OFFICE NOTE   NAME:Lantz, MAKAIYAH SCHWEIGER                         MRN:          [id number]  DATE:01/21/2006                            DOB:          06-Aug-1931    HISTORY:  Ms. Ramstad is a very delightful 77 year old African/American female  who presents today for follow-up evaluation and examination.  She was last  seen in the office on November 06, 2005, by Dr. Arta Silence for shoulder  pain, at which time he increased her Etodolac to 400 mg b.i.d.  Unfortunately she still has some pain in her left shoulder and is worse with  elevation of her shoulder.  At this time she does not want any cortisone  injections nor a surgical consultation.   PAST MEDICAL HISTORY:  Is well-documented in my note of January 11, 2005,  without a change.   CURRENT MEDICATIONS:  1. Glyburide 5 mg daily.  2. Synthroid 100 mcg daily.  3. Glucophage 850 mg b.i.d.  4. Pravastatin 40 mg q.h.s.  5. Etodolac 400 mg b.i.d.  6. Advil p.r.n.  7. Tylenol p.r.n.  8. Tums p.r.n.   FAMILY HISTORY:  Unchanged.  Her sole surviving aunt is now 23 years old.  Will be 100 in January.   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  She is very active in her church and her community.  She does not  miss being at the [facility], although she does go by to see  her friends.   REVIEW OF SYSTEMS:  No fevers, sweats, chills or other constitutional  symptoms.  She saw her eye doctor in January 2007.  No cardiovascular,  respiratory or GI or genitourinary complaints.   PHYSICAL EXAMINATION:  VITAL SIGNS:  Temperature 98.1 degrees, blood  pressure 133/79, pulse 88, weight 222 pounds, height 5 feet 7 inches.  GENERAL:  This is an overweight African/American woman in no acute distress.  HEENT:  Normocephalic and atraumatic.  EAC's and tympanic membranes were  normal.  The patient has very poor  dentition with exposed roots, missing  many teeth, question of some mild gingivitis.  Posterior pharynx clear.  Conjunctivae and sclerae clear.  Pupils equal, round, reactive to light and  accommodation.  EOMI.  Funduscopic exam:  Suboptimal with a hand-held  instrument but she is seeing her ophthalmologist.  NECK:  Supple without organomegaly.  NODES:  No adenopathy was noted in the cervical or supraclavicular regions.  CHEST:  No CVA tenderness.  LUNGS:  Clear to auscultation and percussion.  BREASTS:  Pendulous with normal skin and nipples without discharge.  Has  fibrocystic-type changes.  No mixed mass, lesion or abnormalities.  No  axillary adenopathy.  CARDIOVASCULAR:  With 2+ radial pulses.  No jugular venous distention or  carotid bruits.  She had a quiet precordium with a regular rate and rhythm,  without murmurs, rubs or gallops.  ABDOMEN:  Soft, no guarding or rebound.  No organosplenomegaly was noted but  the exam was hindered  by her size.  PELVIC:  Deferred.  RECTAL:  Deferred.  EXTREMITIES:  Without clubbing, cyanosis or edema or deformity.  NEUROLOGIC:  The patient is awake, alert, oriented to person, place, time  and context.  Sensation was well-preserved to light touch and pinprick in  both feet.   DATA BASE:  A 12-lead electrocardiogram revealed a normal sinus rhythm with  no signs of injury or ischemia.   ASSESSMENT/PLAN:  1. Diabetes:  The patient's last hemoglobin A1c showed good control.  An      A1c was added to today's labs and she will be notified by phone.  The      patient is to continue on Glyburide and Glucophage as listed.  2. Hypothyroid disease:  The patient is stable with an adequate TSH of      0.98.  The plan is to continue her present medications.  3. Lipids:  The patient is almost at goal with an LDL of 102.  Will have      her continue on her present medications of Pravastatin 40 mg daily.      The patient needs to work harder on diet  management.  4. Health maintenance:  The patient did have a colonoscopy in 2004.  Is      scheduled for 2114.  The patient did have a mammogram in June 2007, and      is good for one year.  The patient is advised that she does need to see      her dentist and take care of her teeth, because of increased risk of      infection.   In summary, a very pleasant patient who seems to be medically stable at this  time.  She will be notified by phone of her hemoglobin A1c.  She does need  to see her dentist.                                   Rosalyn Gess. Norins, MD   MEN/MedQ  DD:  01/21/2006  DT:  01/21/2006  Job #:  [id number]   cc:   Patient

## 2010-10-16 NOTE — Op Note (Signed)
NAMEMEKIYAH, GLADWELL                  ACCOUNT NO.:  [id number]   MEDICAL RECORD NO.:  [id number]          PATIENT TYPE:  INP   LOCATION:  1617                         FACILITY:  [facility]   PHYSICIAN:  Deidre Ala, M.D.    DATE OF BIRTH:  Feb 28, 1932   DATE OF PROCEDURE:  05/04/2005  DATE OF DISCHARGE:                                 OPERATIVE REPORT   PREOPERATIVE DIAGNOSIS:  End-stage degenerative joint disease, left knee.   POSTOPERATIVE DIAGNOSIS:  End-stage degenerative joint disease, left knee.   PROCEDURE:  Left total knee arthroplasty using cemented DePuy components,  LCS type with rotating platform.   SURGEON:  1.  Charlesetta Shanks, M.D.   ASSISTANT:  Clarene Reamer, P.A.-C.   ANESTHESIA:  General endotracheal with femoral nerve block.   CULTURES:  None.   DRAINS:  Two medium Hemovacs to automatic.   ESTIMATED BLOOD LOSS:  100 cc.   REPLACEMENT:  Without.   TOURNIQUET TIME:  One hour, 11 minutes.   PATHOLOGIC FINDINGS/HISTORY:  Crickett is a 77 year old female patient of Dr.  Illene Regulus who has had persistent knee pain.  X-rays showed bone-on-bone  in the medial compartment.  We discussed various options, and she wanted to  proceed with surgical intervention.  We first saw her on January 13, 2005.  She had an acute flare-up on April 02, 2005.  We gave her a cortisone  shot, and she wanted to proceed with surgery thereafter.  At surgery today,  tricompartmental disease was noted.  She had excellent flexion/extension  stability and equivalency with flexion gap with a 12.5 rotating platform.  We used a standard left femur, a #3 tibial keel tray, and a 35 mm oval dome  patella, all cemented with two batches of cement, 750 mg of Zinacef per  batch.  We had full flexion and extension to 110, stable to varus and valgus  stressing.  We did do a lateral retinacular release for improved patellar  tilt and track.   With adequate anesthesia obtained using an endotracheal  technique, after  femoral nerve block, the patient was placed in the supine position.  On the  operating room table, the left lower extremity was prepped from the toes to  the tourniquet in a standard fashion.  After standard prepping and draping,  esmarch exsanguination was used.  The tourniquet was let up to 350 mmHg.  A  medium parapatellar skin incision was followed by a medium parapatellar  retinacular incision.  The incision was deep and sharp with a knife.  Hemostasis obtained using the Bovie electrocoagulator.  The patella was  everted.  Fat pad excised as well as the menisci and the cruciates.  Osteophytes were removed.  A slight medial release was carried out.  We then  exposed the proximal tibia and amputated the tibial spine.  The tibial  cutting jig was put in place, and we made our cut.  Then we sized to a  standard femur, placed the cutting jig in place.  Placed a 10 C clamp in  place, pinned it.  Made our anterior  and posterior cuts.  We then placed a 4  degrees valgus cutting jig in place.  Made that cut 5 mm less because her  bone was fairly spongy, her ligaments were stretching a bit.  She fit the 10  in flexion and extension at that point.  We then applied the  anterior/posterior chamfer and far-posterior cutting jig in place on the  femur.  Made those cuts.  Exposed the proximal tibia size to a 3.  Made the  central peg hole and broached for the keel.  We then placed a trial tibial  tray in place, a 10 placed on the femoral component and articulated the knee  through a range of motion.  At this point, a 12.5 was felt to be better with  coming to neutral in extension and flexion, so we switched to a 12.5.  The  patella was then cut down to a 14 from a 22.  We placed the 3 peg drill  guide, placed the 35 mm patella, and articulated the knee through a range of  motion.  We did do a lateral release.  All trial components were removed  while the knee was thoroughly  jet-lavaged, and the cement and components  were checked for sizing as they came onto the field.  The cement was mixed  with Zinacef in a cement gun.  We then exposed the proximal tibial, cemented  on the tibial component, packed it and removed excess cement.  We then  placed a rotating platform.  We then cemented on the femoral component,  impacted it, and removed excess cement.  Held the knee in full extension,  removed excess cement, then held the knee in 35 degrees of flexion while the  cement cured.  The patella was then put in place, clamped on, and held with  a clamp.  Excessive cement removed until the cement cured.  An additional  jet lavage was carried out.  (Note:  We did not use Ancef, we used  vancomycin IV prophylaxis due to a PENICILLIN allergy.  We did use Zinacef  in the cement).  We then articulated the knee through a range of motion.  Felt it to be appropriately stable.  The tourniquet was let down.  Bleeding  points were cauterized.  Hemovac drains were placed in the medial and  lateral portal and brought out the superolateral portal.  The Hemovac was  later hooked up to Autovac.  The wound was then closed in layers with #1  Vicryl interrupted on the retinaculum, #1 PDS running locking.  On the  retinaculum over-sew 0 and 2-0 Vicryl on the subcu, and skin staples.  A  bulky sterile compressive dressing is applied.  Hemovac hooked up to  Autovac.  Knee immobilizer placed.  Patient tolerated the procedure well and  was awakened, taken to the recovery room in satisfactory condition.  She  will be admitted for routine postoperative care and CPM and postoperative  analgesia.           ______________________________  V. Charlesetta Shanks, M.D.     VEP/MEDQ  D:  05/04/2005  T:  05/04/2005  Job:  [id number]   cc:   Rosalyn Gess. Norins, M.D. [facility]  [address]  [place]  [place] [address]

## 2010-12-30 ENCOUNTER — Other Ambulatory Visit: Payer: Self-pay | Admitting: Internal Medicine

## 2010-12-30 NOTE — Telephone Encounter (Signed)
Rx[s] Done. 

## 2011-01-28 ENCOUNTER — Other Ambulatory Visit: Payer: Self-pay | Admitting: Internal Medicine

## 2011-03-05 LAB — GLUCOSE, CAPILLARY
Glucose-Capillary: 112 mg/dL — ABNORMAL HIGH (ref 70–99)
Glucose-Capillary: 113 mg/dL — ABNORMAL HIGH (ref 70–99)
Glucose-Capillary: 120 mg/dL — ABNORMAL HIGH (ref 70–99)
Glucose-Capillary: 135 mg/dL — ABNORMAL HIGH (ref 70–99)
Glucose-Capillary: 144 mg/dL — ABNORMAL HIGH (ref 70–99)
Glucose-Capillary: 155 mg/dL — ABNORMAL HIGH (ref 70–99)
Glucose-Capillary: 236 mg/dL — ABNORMAL HIGH (ref 70–99)
Glucose-Capillary: 94 mg/dL (ref 70–99)

## 2011-03-05 LAB — URINE CULTURE
Colony Count: 80000
Special Requests: NEGATIVE

## 2011-03-05 LAB — BASIC METABOLIC PANEL
BUN: 16 mg/dL (ref 6–23)
CO2: 25 mEq/L (ref 19–32)
CO2: 27 mEq/L (ref 19–32)
Calcium: 8 mg/dL — ABNORMAL LOW (ref 8.4–10.5)
Calcium: 8.2 mg/dL — ABNORMAL LOW (ref 8.4–10.5)
Chloride: 103 mEq/L (ref 96–112)
Creatinine, Ser: 0.82 mg/dL (ref 0.4–1.2)
GFR calc Af Amer: >60 mL/min (ref >60)
Glucose, Bld: 231 mg/dL — ABNORMAL HIGH (ref 70–99)
Potassium: 4.5 mEq/L (ref 3.5–5.1)
Sodium: 131 mEq/L — ABNORMAL LOW (ref 135–145)

## 2011-03-05 LAB — PROTIME-INR: INR: 1 (ref 0.00–1.49)

## 2011-03-05 LAB — COMPREHENSIVE METABOLIC PANEL
Albumin: 3.7 g/dL (ref 3.5–5.2)
BUN: 12 mg/dL (ref 6–23)
Calcium: 9.4 mg/dL (ref 8.4–10.5)
Creatinine, Ser: 0.72 mg/dL (ref 0.4–1.2)
GFR calc Af Amer: >60 mL/min (ref >60)
Glucose, Bld: 114 mg/dL — ABNORMAL HIGH (ref 70–99)
Sodium: 138 mEq/L (ref 135–145)
Total Protein: 7.1 g/dL (ref 6.0–8.3)

## 2011-03-05 LAB — DIFFERENTIAL
Lymphocytes Relative: 29 % (ref 12–46)
Monocytes Absolute: 0.4 10*3/uL (ref 0.1–1.0)
Monocytes Relative: 6 % (ref 3–12)
Neutro Abs: 4.1 10*3/uL (ref 1.7–7.7)
Neutrophils Relative %: 57 % (ref 43–77)

## 2011-03-05 LAB — URINALYSIS, ROUTINE W REFLEX MICROSCOPIC
Bilirubin Urine: NEGATIVE
Glucose, UA: NEGATIVE mg/dL
Hgb urine dipstick: NEGATIVE
Protein, ur: NEGATIVE mg/dL
Specific Gravity, Urine: 1.015 (ref 1.005–1.030)
Urobilinogen, UA: 0.2 mg/dL (ref 0.0–1.0)

## 2011-03-05 LAB — CBC
HCT: 21.9 % — ABNORMAL LOW (ref 36.0–46.0)
HCT: 27.4 % — ABNORMAL LOW (ref 36.0–46.0)
Hemoglobin: 8.8 g/dL — ABNORMAL LOW (ref 12.0–15.0)
MCHC: 32.1 g/dL (ref 30.0–36.0)
MCHC: 32.2 g/dL (ref 30.0–36.0)
MCHC: 32.9 g/dL (ref 30.0–36.0)
MCV: 92.6 fL (ref 78.0–100.0)
MCV: 93.2 fL (ref 78.0–100.0)
Platelets: 200 10*3/uL (ref 150–400)
Platelets: 211 10*3/uL (ref 150–400)
RBC: 2.67 MIL/uL — ABNORMAL LOW (ref 3.87–5.11)
RBC: 2.92 MIL/uL — ABNORMAL LOW (ref 3.87–5.11)
RBC: 4.35 MIL/uL (ref 3.87–5.11)
RDW: 13.6 % (ref 11.5–15.5)
RDW: 14.2 % (ref 11.5–15.5)
WBC: 7.3 10*3/uL (ref 4.0–10.5)

## 2011-03-05 LAB — TYPE AND SCREEN: Antibody Screen: NEGATIVE

## 2011-03-05 LAB — HEMOGLOBIN AND HEMATOCRIT, BLOOD
HCT: 29.9 % — ABNORMAL LOW (ref 36.0–46.0)
Hemoglobin: 9.8 g/dL — ABNORMAL LOW (ref 12.0–15.0)

## 2011-03-05 LAB — APTT: aPTT: 37 seconds (ref 24–37)

## 2011-03-18 ENCOUNTER — Encounter: Payer: Self-pay | Admitting: Internal Medicine

## 2011-03-24 ENCOUNTER — Encounter: Payer: Self-pay | Admitting: Internal Medicine

## 2011-05-01 ENCOUNTER — Other Ambulatory Visit: Payer: Self-pay | Admitting: Internal Medicine

## 2011-05-10 ENCOUNTER — Encounter: Payer: Self-pay | Admitting: Internal Medicine

## 2011-05-11 ENCOUNTER — Ambulatory Visit (INDEPENDENT_AMBULATORY_CARE_PROVIDER_SITE_OTHER): Payer: 59 | Admitting: Internal Medicine

## 2011-05-11 ENCOUNTER — Encounter: Payer: Self-pay | Admitting: Internal Medicine

## 2011-05-11 ENCOUNTER — Other Ambulatory Visit (INDEPENDENT_AMBULATORY_CARE_PROVIDER_SITE_OTHER): Payer: 59

## 2011-05-11 VITALS — BP 140/80 | HR 86 | Temp 98.0°F | Resp 14 | Ht 64.0 in | Wt 208.2 lb

## 2011-05-11 DIAGNOSIS — E119 Type 2 diabetes mellitus without complications: Secondary | ICD-10-CM

## 2011-05-11 DIAGNOSIS — E785 Hyperlipidemia, unspecified: Secondary | ICD-10-CM

## 2011-05-11 DIAGNOSIS — E039 Hypothyroidism, unspecified: Secondary | ICD-10-CM

## 2011-05-11 DIAGNOSIS — Z23 Encounter for immunization: Secondary | ICD-10-CM

## 2011-05-11 DIAGNOSIS — I10 Essential (primary) hypertension: Secondary | ICD-10-CM

## 2011-05-11 DIAGNOSIS — M1710 Unilateral primary osteoarthritis, unspecified knee: Secondary | ICD-10-CM

## 2011-05-11 DIAGNOSIS — Z0000 Encounter for general adult medical examination without abnormal findings: Secondary | ICD-10-CM

## 2011-05-11 DIAGNOSIS — E669 Obesity, unspecified: Secondary | ICD-10-CM

## 2011-05-11 LAB — CBC WITH DIFFERENTIAL/PLATELET
Basophils Absolute: 0 10*3/uL (ref 0.0–0.1)
Hemoglobin: 13.1 g/dL (ref 12.0–15.0)
Lymphocytes Relative: 34.6 % (ref 12.0–46.0)
Monocytes Relative: 5.8 % (ref 3.0–12.0)
Neutro Abs: 4.7 10*3/uL (ref 1.4–7.7)
RDW: 13.9 % (ref 11.5–14.6)

## 2011-05-11 LAB — HEPATIC FUNCTION PANEL
Albumin: 3.9 g/dL (ref 3.5–5.2)
Alkaline Phosphatase: 63 U/L (ref 39–117)
Bilirubin, Direct: 0.1 mg/dL (ref 0.0–0.3)
Total Bilirubin: 0.6 mg/dL (ref 0.3–1.2)

## 2011-05-11 LAB — LIPID PANEL
LDL Cholesterol: 90 mg/dL (ref 0–99)
Total CHOL/HDL Ratio: 3

## 2011-05-11 LAB — COMPREHENSIVE METABOLIC PANEL
ALT: 10 U/L (ref 0–35)
BUN: 21 mg/dL (ref 6–23)
CO2: 27 mEq/L (ref 19–32)
Calcium: 9.2 mg/dL (ref 8.4–10.5)
Chloride: 105 mEq/L (ref 96–112)
Creatinine, Ser: 1 mg/dL (ref 0.4–1.2)
GFR: 70.73 mL/min (ref >60.00)

## 2011-05-11 LAB — HEMOGLOBIN A1C: Hgb A1c MFr Bld: 7.2 % — ABNORMAL HIGH (ref 4.6–6.5)

## 2011-05-11 NOTE — Progress Notes (Signed)
Subjective:    Patient ID: Michelle Morton, female    DOB: 03/27/1932, 77 y.o.   MRN: [id number]  HPI The patient is here for annual Medicare wellness examination and management of other chronic and acute problems.  She has been stressed- her husband had a CVA - 2 months in hospital or rehab. Her health has been fine. She does have occasional palpitation   The risk factors are reflected in the social history.  The roster of all physicians providing medical care to patient - is listed in the Snapshot section of the chart.  Activities of daily living:  The patient is 100% inedpendent in all ADLs: dressing, toileting, feeding as well as independent mobility  Home safety : The patient has smoke detectors in the home. They wear seatbelts. No firearms at home. There is no violence in the home.   There is no risks for hepatitis, STDs or HIV. There is no   history of blood transfusion. They have no travel history to infectious disease endemic areas of the world.  The patient has seen their dentist in the last six month. They have seen their eye doctor in the last year. They admit to any hearing difficulty, tinnitis, and have not had repeat audiologic testing in the last year.  They do not  have excessive sun exposure. Discussed the need for sun protection: hats, long sleeves and use of sunscreen if there is significant sun exposure. No falls or fall risk.  Diet: the importance of a healthy diet is discussed. They do have a healthy diet most of the time.  The patient has a regular exercise program: exercise class , 45 duration, 2 per week.  The benefits of regular aerobic exercise were discussed.  Depression screen: there are mild signs or vegative symptoms of depression- irritability, change in appetite, anhedonia, sadness/tearfullness. She has been able to work this out through the help of her church.  Cognitive assessment: the patient manages all their financial and personal affairs and is actively  engaged.   The following portions of the patient's history were reviewed and updated as appropriate: allergies, current medications, past family history, past medical history,  past surgical history, past social history  and problem list.  Vision, hearing, body mass index were assessed and reviewed.   During the course of the visit the patient was educated and counseled about appropriate screening and preventive services including : fall prevention , diabetes screening, nutrition counseling, colorectal cancer screening, and recommended immunizations.  Past Medical History  Diagnosis Date  . Hypertension   . Arthus phenomenon   . Osteoarthritis, knee   . Hyperlipidemia   . Diabetes mellitus, type 2   . Hypothyroidism    Past Surgical History  Procedure Date  . Abdominal hysterectomy   . Replacement total knee     left  knee '06  . Knee arthroplasty     left   Family History  Problem Relation Age of Onset  . Diabetes Father   . Hypertension Father   . Cancer Daughter     breast cancer survior   History   Social History  . Marital Status: Married    Spouse Name: N/A    Number of Children: N/A  . Years of Education: N/A   Occupational History  . Not on file.   Social History Main Topics  . Smoking status: Former Smoker    Quit date: 05/10/1992  . Smokeless tobacco: Never Used  . Alcohol Use: Not on file  .  Drug Use: Not on file  . Sexually Active: Not on file   Other Topics Concern  . Not on file   Social History Narrative   HSG, [employer]- 1 year nursing schoolMarried '562 sons, '64, '68, 2 daughters- '57, '59; grandchildren 5Retired [employer] 30 yearsMarriage in good healthEnd of life: no cardiac resuscitation, no mechanical ventilation, no futile or heroic measures.       Review of Systems Constitutional:  Negative for fever, chills, activity change and unexpected weight change.  HEENT:  Negative for hearing loss, ear pain, congestion, neck stiffness and postnasal  drip. Negative for sore throat or swallowing problems. Negative for dental complaints.   Eyes: Negative for vision loss or change in visual acuity.  Respiratory: Negative for chest tightness and wheezing. Negative for DOE.   Cardiovascular: Negative for chest pain or palpitations. No decreased exercise tolerance Gastrointestinal: No change in bowel habit. No bloating or gas. No reflux or indigestion Genitourinary: Negative for urgency, frequency, flank pain and difficulty urinating.  Musculoskeletal: Negative for myalgias, back pain, arthralgias and gait problem. Knee pain responds to NSAIDs Neurological: Negative for dizziness, tremors, weakness and headaches.  Hematological: Negative for adenopathy.  Psychiatric/Behavioral: Negative for behavioral problems and dysphoric mood.   Current outpatient prescriptions:ibuprofen (ADVIL,MOTRIN) 200 MG tablet, Take 200 mg by mouth every 6 (six) hours as needed.  , Disp: , Rfl: ;  naproxen sodium (ANAPROX) 220 MG tablet, Take 220 mg by mouth as needed.  , Disp: , Rfl: ;  glyBURIDE (DIABETA) 5 MG tablet, TAKE 1 TABLET ONCE DAILY AFTER A MEAL, Disp: 30 tablet, Rfl: 6;  levothyroxine (SYNTHROID, LEVOTHROID) 100 MCG tablet, TAKE ONE TABLET BY MOUTH EVERY DAY, Disp: 30 tablet, Rfl: 1 lisinopril (PRINIVIL,ZESTRIL) 10 MG tablet, Take 1 tablet (10 mg total) by mouth daily., Disp: 30 tablet, Rfl: 11;  metFORMIN (GLUCOPHAGE) 850 MG tablet, TAKE ONE TABLET BY MOUTH TWICE DAILY, Disp: 60 tablet, Rfl: 6;  pravastatin (PRAVACHOL) 40 MG tablet, TAKE ONE TABLET BY MOUTH AT BEDTIME, Disp: 30 tablet, Rfl: 1      Objective:   Physical Exam Vitals reviewed - BP stable, afebrile, BMI 35.75 Gen'l- obese AA woman in no distress HEENT - C&S clear, EACs/TMs normal, oropharynx w/o lesions Neck- supple, no thyromegaly Chest- w/o deformity Pulm - normal respirations, no rales or wheezes Breast exam- deferred to recent mammogram Cor- 2+ radial pulse, RRR w/o m/r/g Abd - obese,  BS+, size hindered palpation Pelvic/rectal - deferred  Ext- no deformity, no synovial thickening small joints, nl ROM medium and large joints. Neuro - A&O x 3, CN II-XII normal, gait normal Derm- clear head,face,neck, arms.  Lab Results  Component Value Date   WBC 8.6 05/11/2011   HGB 13.1 05/11/2011   HCT 39.8 05/11/2011   PLT 302.0 05/11/2011   GLUCOSE 63* 05/11/2011   CHOL 159 05/11/2011   TRIG 65.0 05/11/2011   HDL 56.30 05/11/2011   LDLCALC 90 05/11/2011   ALT 10 05/11/2011   ALT 10 05/11/2011   AST 18 05/11/2011   AST 18 05/11/2011   NA 140 05/11/2011   K 4.7 05/11/2011   CL 105 05/11/2011   CREATININE 1.0 05/11/2011   BUN 21 05/11/2011   CO2 27 05/11/2011   TSH 0.27* 05/11/2011   INR 1.0 05/03/2008   HGBA1C 7.2* 05/11/2011          Assessment & Plan:

## 2011-05-12 DIAGNOSIS — Z0000 Encounter for general adult medical examination without abnormal findings: Secondary | ICD-10-CM | POA: Insufficient documentation

## 2011-05-12 DIAGNOSIS — E669 Obesity, unspecified: Secondary | ICD-10-CM | POA: Insufficient documentation

## 2011-05-12 MED ORDER — LISINOPRIL 10 MG PO TABS: 10.0000 mg | ORAL_TABLET | Freq: Every day | ORAL | Status: DC

## 2011-05-12 NOTE — Assessment & Plan Note (Signed)
Interval history is unremarkable for acute medical illness or injury. Chronic knee pain is an issue. Physical exam, sans pelvice and breast, is remarkable for weight, knee tenderness. Lab results are great. She is current with breast cancer screening with mammography. She reports she has had colonoscopy-will review paper chart. Immunizations - current.  In general - a very nice woman who is medically stable. The main health improvement will be weight management and she is encouraged in this effort. She will return in 1 month for BP check and lab follow-up.

## 2011-05-12 NOTE — Assessment & Plan Note (Addendum)
BP Readings from Last 3 Encounters:  05/11/11 140/80  04/09/10 130/72  03/02/10 144/84   Acceptable but lower will be better. As a diabetic there will be multiple advantages to ACE/ARB treatment  Plan - lisinopril 10 mg daily           Weight loss           F/u Bmet in 1 month

## 2011-05-12 NOTE — Assessment & Plan Note (Signed)
Continued pain in both knees - a limiting factor with walking.  Plan - weight loss           NSAIDs           F/u with ortho

## 2011-05-12 NOTE — Assessment & Plan Note (Signed)
Lab reveals good control w/ LDL better than goal of 100 or less. Liver functions normal  Plan - continue pravachol

## 2011-05-12 NOTE — Assessment & Plan Note (Signed)
Major factor in metabolic syndrome - affecting DM, lipids, HTN  Plan - weight management through smart food choices, PORTION SIZE CONTROL, regular non-weight bearing exercise.           Target weight 175 lbs, goal is to loose 1-2 lbs per month

## 2011-05-12 NOTE — Assessment & Plan Note (Signed)
A1C close to goal of 7% or less.  Plan continue glyburide and metformin         Continued dietary management with no sugar, low carb diet.         Increase exercise - goal is daily exercise, e.g. Walking, exercise class, etc.

## 2011-05-12 NOTE — Assessment & Plan Note (Signed)
TSH slightly low but acceptable   Plan - continue present dose of replacement hormone

## 2011-05-16 ENCOUNTER — Encounter: Payer: Self-pay | Admitting: Internal Medicine

## 2011-05-18 ENCOUNTER — Telehealth: Payer: Self-pay | Admitting: *Deleted

## 2011-05-18 NOTE — Telephone Encounter (Signed)
Typo noted. Record locked. Will change at next visit.

## 2011-05-18 NOTE — Telephone Encounter (Signed)
Patient states that on her narrative mailed to her after last OV, it stated that she attended [employer], she would like that corrected to [employer]. Please & Thank you.

## 2011-07-04 ENCOUNTER — Other Ambulatory Visit: Payer: Self-pay | Admitting: Internal Medicine

## 2011-08-30 ENCOUNTER — Other Ambulatory Visit: Payer: Self-pay | Admitting: Internal Medicine

## 2012-02-28 ENCOUNTER — Other Ambulatory Visit: Payer: Self-pay | Admitting: Internal Medicine

## 2012-03-01 ENCOUNTER — Other Ambulatory Visit: Payer: Self-pay | Admitting: Internal Medicine

## 2012-03-22 ENCOUNTER — Ambulatory Visit (INDEPENDENT_AMBULATORY_CARE_PROVIDER_SITE_OTHER): Payer: Medicare Other

## 2012-03-22 DIAGNOSIS — Z23 Encounter for immunization: Secondary | ICD-10-CM

## 2012-03-23 ENCOUNTER — Ambulatory Visit (INDEPENDENT_AMBULATORY_CARE_PROVIDER_SITE_OTHER): Payer: Medicare Other | Admitting: Internal Medicine

## 2012-03-23 ENCOUNTER — Encounter: Payer: Self-pay | Admitting: Internal Medicine

## 2012-03-23 VITALS — BP 152/88 | HR 96 | Temp 97.5°F | Resp 16 | Wt 195.0 lb

## 2012-03-23 DIAGNOSIS — M25512 Pain in left shoulder: Secondary | ICD-10-CM

## 2012-03-23 DIAGNOSIS — M25519 Pain in unspecified shoulder: Secondary | ICD-10-CM

## 2012-03-23 NOTE — Progress Notes (Signed)
Subjective:    Patient ID: Michelle Morton, female    DOB: 05-24-1932, 78 y.o.   MRN: [id number]  HPI Michelle Morton presents after a fall on Tuesday. She had skipped breakfast and then went to exercise class. She was doing a line dance and she fell, landing on her left arm. She has continued to have pain. No swelling. She does have decreased ROM with adduction or rotation. No loss of sensation and no weakness.  She has lost 13 lbs since December.  Past Medical History  Diagnosis Date  . Hypertension   . Arthus phenomenon   . Osteoarthritis, knee   . Hyperlipidemia   . Diabetes mellitus, type 2   . Hypothyroidism    Past Surgical History  Procedure Date  . Abdominal hysterectomy   . Replacement total knee     left  knee '06  . Knee arthroplasty     left   Family History  Problem Relation Age of Onset  . Diabetes Father   . Hypertension Father   . Cancer Daughter     breast cancer survior   History   Social History  . Marital Status: Married    Spouse Name: N/A    Number of Children: N/A  . Years of Education: N/A   Occupational History  . Not on file.   Social History Main Topics  . Smoking status: Former Smoker    Quit date: 05/10/1992  . Smokeless tobacco: Never Used  . Alcohol Use: Not on file  . Drug Use: Not on file  . Sexually Active: Not on file   Other Topics Concern  . Not on file   Social History Narrative   HSG, [employer]- 1 year nursing schoolMarried '562 sons, '64, '68, 2 daughters- '57, '59; grandchildren 5Retired [employer] 30 yearsMarriage in good healthEnd of life: no cardiac resuscitation, no mechanical ventilation, no futile or heroic measures.    Current Outpatient Prescriptions on File Prior to Visit  Medication Sig Dispense Refill  . glyBURIDE (DIABETA) 5 MG tablet TAKE ONE TABLET BY MOUTH EVERY DAY AFTER A MEAL  30 tablet  6  . ibuprofen (ADVIL,MOTRIN) 200 MG tablet Take 200 mg by mouth every 6 (six) hours as needed.        Marland Kitchen levothyroxine  (SYNTHROID, LEVOTHROID) 100 MCG tablet TAKE ONE TABLET BY MOUTH EVERY DAY  30 tablet  11  . lisinopril (PRINIVIL,ZESTRIL) 10 MG tablet Take 1 tablet (10 mg total) by mouth daily.  30 tablet  11  . metFORMIN (GLUCOPHAGE) 850 MG tablet TAKE ONE TABLET BY MOUTH TWICE DAILY  60 tablet  4  . naproxen sodium (ANAPROX) 220 MG tablet Take 220 mg by mouth as needed.        . pravastatin (PRAVACHOL) 40 MG tablet TAKE ONE TABLET BY MOUTH AT BEDTIME  30 tablet  11      Review of Systems System review is negative for any constitutional, cardiac, pulmonary, GI or neuro symptoms or complaints other than as described in the HPI.     Objective:   Physical Exam Filed Vitals:   03/23/12 1521  BP: 152/88  Pulse: 96  Temp: 97.5 F (36.4 C)  Resp: 16   Gen'l - overweight AA woman in no distress Cor - RRR PUlm normal respirations MSK - pain with adduction of the shoulder. Full passive ROM/. No click in the shoulder. Able to reach to bra line, able to reach behind her neck to the other shoulder with some difficulty,  can do arm circles.       Assessment & Plan:  Shoulder- pain with adduction of the shoulder. Full passive ROM/. No click in the shoulder. Able to reach to bra line, able to reach behind her neck to the other shoulder with some difficulty, can do arm circles. Most likely bruised and some mild bursitis.  Plan  5-10 minutes of range of motion exercise twice a day: flex foreward  and back; reach behind your back and across to opposite shoulder; arm circles forward and back; wall crawl.  For persistent pain and stiffness or loss of range of motion call for referral to a sports medicine doctor.  Resume your exercise class as soon as you feel comfortable.

## 2012-03-23 NOTE — Patient Instructions (Addendum)
Shoulder- pain with adduction of the shoulder. Full passive ROM/. No click in the shoulder. Able to reach to bra line, able to reach behind her neck to the other shoulder with some difficulty, can do arm circles. Most likely bruised and some mild bursitis.  Plan  5-10 minutes of range of motion exercise twice a day: flex foreward  and back; reach behind your back and across to opposite shoulder; arm circles forward and back; wall crawl.  For persistent pain and stiffness or loss of range of motion call for referral to a sports medicine doctor.  Resume your exercise class as soon as you feel comfortable.

## 2012-03-30 ENCOUNTER — Encounter: Payer: Self-pay | Admitting: Internal Medicine

## 2012-05-17 ENCOUNTER — Encounter: Payer: Self-pay | Admitting: Internal Medicine

## 2012-05-17 ENCOUNTER — Ambulatory Visit (INDEPENDENT_AMBULATORY_CARE_PROVIDER_SITE_OTHER): Payer: Medicare Other | Admitting: Internal Medicine

## 2012-05-17 ENCOUNTER — Other Ambulatory Visit (INDEPENDENT_AMBULATORY_CARE_PROVIDER_SITE_OTHER): Payer: Medicare Other

## 2012-05-17 VITALS — BP 130/80 | HR 93 | Temp 97.1°F | Resp 10 | Ht 64.5 in | Wt 191.1 lb

## 2012-05-17 DIAGNOSIS — I10 Essential (primary) hypertension: Secondary | ICD-10-CM

## 2012-05-17 DIAGNOSIS — E119 Type 2 diabetes mellitus without complications: Secondary | ICD-10-CM

## 2012-05-17 DIAGNOSIS — E785 Hyperlipidemia, unspecified: Secondary | ICD-10-CM

## 2012-05-17 DIAGNOSIS — Z0000 Encounter for general adult medical examination without abnormal findings: Secondary | ICD-10-CM

## 2012-05-17 DIAGNOSIS — E039 Hypothyroidism, unspecified: Secondary | ICD-10-CM

## 2012-05-17 DIAGNOSIS — H9319 Tinnitus, unspecified ear: Secondary | ICD-10-CM

## 2012-05-17 DIAGNOSIS — E669 Obesity, unspecified: Secondary | ICD-10-CM

## 2012-05-17 LAB — COMPREHENSIVE METABOLIC PANEL
BUN: 18 mg/dL (ref 6–23)
CO2: 27 mEq/L (ref 19–32)
Calcium: 9 mg/dL (ref 8.4–10.5)
Chloride: 105 mEq/L (ref 96–112)
Creatinine, Ser: 1 mg/dL (ref 0.4–1.2)
GFR: 72.24 mL/min (ref >60.00)
Glucose, Bld: 114 mg/dL — ABNORMAL HIGH (ref 70–99)

## 2012-05-17 LAB — HEPATIC FUNCTION PANEL
Albumin: 3.8 g/dL (ref 3.5–5.2)
Bilirubin, Direct: 0.1 mg/dL (ref 0.0–0.3)
Total Protein: 7.3 g/dL (ref 6.0–8.3)

## 2012-05-17 LAB — T4, FREE: Free T4: 1.48 ng/dL (ref 0.60–1.60)

## 2012-05-17 LAB — LIPID PANEL
Cholesterol: 146 mg/dL (ref 0–200)
LDL Cholesterol: 84 mg/dL (ref 0–99)
Triglycerides: 66 mg/dL (ref 0.0–149.0)

## 2012-05-17 LAB — TSH: TSH: 0.17 u[IU]/mL — ABNORMAL LOW (ref 0.35–5.50)

## 2012-05-17 NOTE — Progress Notes (Signed)
Subjective:    Patient ID: Michelle Morton, female    DOB: 04-07-1932, 78 y.o.   MRN: [id number]  HPI The patient is here for annual Medicare wellness examination and management of other chronic and acute problems. Has had a good year personally but her husband is developing dementia.   The risk factors are reflected in the social history.  The roster of all physicians providing medical care to patient - is listed in the Snapshot section of the chart.  Activities of daily living:  The patient is 100% inedpendent in all ADLs: dressing, toileting, feeding as well as independent mobility  Home safety : The patient has smoke detectors in the home. Falls - had a fall at exercise class. Home is fall safe. They wear seatbelts. No firearms at home   There is no risks for hepatitis, STDs or HIV. There is history of blood transfusion '09. They have no travel history to infectious disease endemic areas of the world.  The patient has seen their dentist in the last six month. They have seen their eye doctor in the last year. They deny any hearing difficulty except for tinnitus and have not had audiologic testing in the last year.  She does have mild labyrinthitis.   They do not  have excessive sun exposure. Discussed the need for sun protection: hats, long sleeves and use of sunscreen if there is significant sun exposure.   Diet: the importance of a healthy diet is discussed. They do have a healthy diet.  The patient has no regular exercise program.  The benefits of regular aerobic exercise were discussed. She plans to resume her exercise class in January.   Depression screen: there are no signs or vegative symptoms of depression- irritability, change in appetite, anhedonia, sadness/tearfullness.  Cognitive assessment: the patient manages all their financial and personal affairs and is actively engaged.   Past Medical History  Diagnosis Date  . Hypertension   . Arthus phenomenon   . Osteoarthritis,  knee   . Hyperlipidemia   . Diabetes mellitus, type 2   . Hypothyroidism    Past Surgical History  Procedure Date  . Abdominal hysterectomy   . Replacement total knee     left  knee '06  . Knee arthroplasty     left   Family History  Problem Relation Age of Onset  . Diabetes Father   . Hypertension Father   . Cancer Daughter     breast cancer survior   History   Social History  . Marital Status: Married    Spouse Name: N/A    Number of Children: N/A  . Years of Education: N/A   Occupational History  . Not on file.   Social History Main Topics  . Smoking status: Former Smoker    Quit date: 05/10/1992  . Smokeless tobacco: Never Used  . Alcohol Use: Not on file  . Drug Use: Not on file  . Sexually Active: Not on file   Other Topics Concern  . Not on file   Social History Narrative   [employer], [employer]- 1 year nursing schoolMarried '562 sons, '64, '68, 2 daughters- '57, '59; grandchildren 5Retired [employer] 30 yearsMarriage in good healthEnd of life: no cardiac resuscitation, no mechanical ventilation, no futile or heroic measures.    Current Outpatient Prescriptions on File Prior to Visit  Medication Sig Dispense Refill  . DiphenhydrAMINE HCl (BENADRYL ALLERGY PO) Take 1 tablet by mouth as needed.      . glyBURIDE (DIABETA)  5 MG tablet TAKE ONE TABLET BY MOUTH EVERY DAY AFTER A MEAL  30 tablet  6  . ibuprofen (ADVIL,MOTRIN) 200 MG tablet Take 200 mg by mouth every 6 (six) hours as needed.        Marland Kitchen levothyroxine (SYNTHROID, LEVOTHROID) 100 MCG tablet TAKE ONE TABLET BY MOUTH EVERY DAY  30 tablet  11  . lisinopril (PRINIVIL,ZESTRIL) 10 MG tablet Take 10 mg by mouth daily.      . metFORMIN (GLUCOPHAGE) 850 MG tablet TAKE ONE TABLET BY MOUTH TWICE DAILY  60 tablet  4  . naproxen sodium (ANAPROX) 220 MG tablet Take 220 mg by mouth as needed.        . pravastatin (PRAVACHOL) 40 MG tablet TAKE ONE TABLET BY MOUTH AT BEDTIME  30 tablet  11     During the course of the visit the  patient was educated and counseled about appropriate screening and preventive services including : fall prevention , diabetes screening, nutrition counseling, colorectal cancer screening, and recommended immunizations.    Review of Systems Constitutional:  Negative for fever, chills, activity change and unexpected weight change.  HEENT:  Negative for hearing loss, ear pain, congestion, neck stiffness and postnasal drip. Negative for sore throat or swallowing problems. Negative for dental complaints.   Eyes: Negative for vision loss or change in visual acuity.  Respiratory: Negative for chest tightness and wheezing. Negative for DOE.   Cardiovascular: Negative for chest pain or palpitations. No decreased exercise tolerance Gastrointestinal: No change in bowel habit. No bloating or gas. No reflux or indigestion Genitourinary: Positive for stress incontinence. Negative for urgency, frequency, flank pain and difficulty urinating.  Musculoskeletal: Negative for myalgias, back pain, arthralgias and gait problem.  Neurological: Negative for dizziness, tremors, weakness and headaches.  Hematological: Negative for adenopathy.  Psychiatric/Behavioral: Negative for behavioral problems and dysphoric mood.      Objective:   Physical Exam Filed Vitals:   05/17/12 0859  BP: 130/80  Pulse: 93  Temp: 97.1 F (36.2 C)  Resp: 10   Wt Readings from Last 3 Encounters:  05/17/12 191 lb 1.3 oz (86.673 kg)  03/23/12 195 lb (88.451 kg)  05/11/11 208 lb 4 oz (94.462 kg)   Gen'l: well nourished, well developed overweight AA Woman in no distress HEENT - [place]/AT, EACs/TMs normal, oropharynx with native dentition in good condition with upper and lower partial denture, no buccal or palatal lesions, posterior pharynx clear, mucous membranes moist. C&S clear, PERRLA. Neck - supple, no thyromegaly Nodes- negative submental, cervical, supraclavicular regions Chest - no deformity, no CVAT Lungs - clear without  rales, wheezes. No increased work of breathing Breast - deferred to recent mammogram Cardiovascular - regular rate and rhythm, quiet precordium, no murmurs, rubs or gallops, 2+ radial, DP and PT pulses Abdomen - BS+ x 4, no HSM, no guarding or rebound or tenderness Pelvic - deferred - aged out for PAP Rectal - deferred  Extremities - no clubbing, cyanosis, edema or deformity.  Neuro - A&O x 3, CN II-XII normal, motor strength normal and equal, DTRs 2+ and symmetrical biceps, radial, and patellar tendons. Cerebellar - no tremor, no rigidity, fluid movement and normal gait. Derm - Head, neck, back, abdomen and extremities without suspicious lesions  Lab Results  Component Value Date   WBC 8.6 05/11/2011   HGB 13.1 05/11/2011   HCT 39.8 05/11/2011   PLT 302.0 05/11/2011   GLUCOSE 114* 05/17/2012   CHOL 146 05/17/2012   TRIG 66.0 05/17/2012  HDL 48.80 05/17/2012   LDLCALC 84 05/17/2012        ALT 8 05/17/2012   AST 15 05/17/2012        NA 137 05/17/2012   K 4.4 05/17/2012   CL 105 05/17/2012   CREATININE 1.0 05/17/2012   BUN 18 05/17/2012   CO2 27 05/17/2012   TSH 0.17* 05/17/2012   INR 1.0 05/03/2008   HGBA1C 6.8* 05/17/2012         Assessment & Plan:

## 2012-05-17 NOTE — Patient Instructions (Addendum)
Thanks for coming to see me.  Your exam is normal - you have done a great job with your weight.  You will receive a full report including lab and you may also sign up for MyChart  You should read "the 24 Hour Day" for caregivers of patients with dementia. Check on line for this or at any major bookstore.  Have a Blessed Christmas.

## 2012-05-18 MED ORDER — LEVOTHYROXINE SODIUM 88 MCG PO TABS: 88.0000 ug | ORAL_TABLET | Freq: Every day | ORAL | Status: DC

## 2012-05-18 NOTE — Assessment & Plan Note (Signed)
Discussed the chronic nature of this problem.  Plan recommned a "white noise" generator to help rest at night.

## 2012-05-18 NOTE — Assessment & Plan Note (Signed)
Vitals - 1 value per visit 05/17/2012 03/23/2012 05/11/2011 04/09/2010  Weight (lb) 191.08 195 208.25 216  HEIGHT 5' 4.5"  5\' 4"  5' 6.5"  BMI 32.3 33.46 35.73 34.35   Very good job of bringing down weight: 25 lbs since Dec '12!!!  Plan Keep up the good work!!!

## 2012-05-18 NOTE — Assessment & Plan Note (Signed)
BP Readings from Last 3 Encounters:  05/17/12 130/80  03/23/12 152/88  05/11/11 140/80   Good control today, due in large measure to weight loss.  Plan  Continue present medication.

## 2012-05-18 NOTE — Assessment & Plan Note (Signed)
Interval medical history benign with a great job done on weight management. Physical exam is normal. Lab results are in normal range. She is current with colorectal and cancer screening. Immunizations are up to date except for the new recommendation for shingles vaccine.  In summary - a very pleasant woman who is medically stable. She is managing her husband's decline well and she is encouraged to read "the 24 Hour day," and to join a support group. She will return as needed or in 1 year. She will return for repeat  TSH on new medication dose 4 weeks after making the change in dose.

## 2012-05-18 NOTE — Assessment & Plan Note (Signed)
Excellent control with diet and medication: LDL better than goal of 100 or less. Liver functions are normal.  Plan Continue present regimen

## 2012-05-18 NOTE — Assessment & Plan Note (Signed)
Lab Results  Component Value Date   TSH 0.17* 05/17/2012   May be slightly over corrected.  Plan - reduce levothyroxine to 88 mcg at next refill  Follow u TSH in 6- weeks

## 2012-05-18 NOTE — Assessment & Plan Note (Signed)
Lab Results  Component Value Date   HGBA1C 6.8* 05/17/2012   Good control - better than goal of 7% or less.  Plan Continue lifestyle management: no sugar, low carb diet and exercise  Continue present medications

## 2012-05-19 ENCOUNTER — Encounter: Payer: Self-pay | Admitting: Internal Medicine

## 2012-05-29 ENCOUNTER — Other Ambulatory Visit: Payer: Self-pay | Admitting: Internal Medicine

## 2012-06-02 ENCOUNTER — Encounter: Payer: Self-pay | Admitting: Internal Medicine

## 2012-06-27 ENCOUNTER — Other Ambulatory Visit: Payer: Self-pay | Admitting: Internal Medicine

## 2012-06-29 ENCOUNTER — Other Ambulatory Visit: Payer: Self-pay | Admitting: Internal Medicine

## 2012-07-27 ENCOUNTER — Telehealth: Payer: Self-pay | Admitting: Internal Medicine

## 2012-07-27 ENCOUNTER — Other Ambulatory Visit: Payer: Self-pay | Admitting: Internal Medicine

## 2012-07-27 NOTE — Telephone Encounter (Signed)
Patient Information:  Caller Name: [facility]  Phone: [phone]  Patient: Michelle Morton, Michelle Morton  Gender: Female  DOB: [birth year]  Age: 79 Years  PCP: Illene Regulus (Adults only)  Office Follow Up:  Does the office need to follow up with this patient?: Yes  Instructions For The Office: Requesting refill of Metformin be called in to [facility] on [address] - [phone]. States [facility] has faxed office. Will be out of Metformin on 07/28/12  RN Note:  Advised to be seen in  UC or ED today - 07/27/12. Also, requesting refill of Metformin be called in to [facility] [phone]. [facility] has faxed office. [facility] will be out of Metformin on 2/28.  Symptoms  Reason For Call & Symptoms: Latrecia states she had onset of headache, post nasal drainage, dry  cough and watery eyes on 07/26/12. Did not sleep well. Took Benadryl. STates ore thorat is her main discomfort.  Reviewed Health History In EMR: Yes  Reviewed Medications In EMR: Yes  Reviewed Allergies In EMR: Yes  Reviewed Surgeries / Procedures: Yes  Date of Onset of Symptoms: 07/26/2012  Treatments Tried: Benadryl  Treatments Tried Worked: No  Guideline(s) Used:  Sore Throat  Disposition Per Guideline:   See Today in Office  Reason For Disposition Reached:   Diabetes mellitus or weak immune system (e.g., HIV positive, cancer chemo, splenectomy, organ transplant, chronic steroids)  Advice Given:  Fever Medicines:  For fevers above 101 F (38.3 C) take either acetaminophen or ibuprofen.  They are over-the-counter (OTC) drugs that help treat both fever and pain. You can buy them at the drugstore.  Soft Diet:   Cold drinks and milk shakes are especially good (Reason: swollen tonsils can make some foods hard to swallow).  Liquids:  Adequate liquid intake is important to prevent dehydration. Drink 6-8 glasses of water per day.  Contagiousness:   You can return to work or school after the fever is gone and you feel well enough to participate  in normal activities. If your doctor determines that you have Strep throat, then you will need to take an antibiotic for 24 hours before you can return.  Expected Course:  Sore throats with viral illnesses usually last 3 or 4 days.  Call Back If:  Sore throat is the main symptom and it lasts longer than 24 hours  Sore throat is mild but lasts longer than 4 days  Fever lasts longer than 3 days  You become worse.

## 2012-09-26 ENCOUNTER — Other Ambulatory Visit: Payer: Self-pay | Admitting: Internal Medicine

## 2012-10-25 ENCOUNTER — Other Ambulatory Visit: Payer: Self-pay | Admitting: Internal Medicine

## 2012-12-26 ENCOUNTER — Other Ambulatory Visit: Payer: Self-pay | Admitting: Internal Medicine

## 2013-02-14 ENCOUNTER — Ambulatory Visit (INDEPENDENT_AMBULATORY_CARE_PROVIDER_SITE_OTHER): Payer: Medicare (Managed Care) | Admitting: Internal Medicine

## 2013-02-14 ENCOUNTER — Encounter: Payer: Self-pay | Admitting: Internal Medicine

## 2013-02-14 VITALS — BP 140/80 | HR 104 | Temp 97.0°F | Ht 66.0 in | Wt 184.4 lb

## 2013-02-14 DIAGNOSIS — I10 Essential (primary) hypertension: Secondary | ICD-10-CM

## 2013-02-14 DIAGNOSIS — J309 Allergic rhinitis, unspecified: Secondary | ICD-10-CM

## 2013-02-14 DIAGNOSIS — R609 Edema, unspecified: Secondary | ICD-10-CM

## 2013-02-14 HISTORY — DX: Allergic rhinitis, unspecified: J30.9

## 2013-02-14 MED ORDER — LOSARTAN POTASSIUM-HCTZ 50-12.5 MG PO TABS: 1.0000 | ORAL_TABLET | Freq: Every day | ORAL | Status: DC

## 2013-02-14 NOTE — Assessment & Plan Note (Signed)
Most likely related to increased oral fluids, in the face of venous insufficiency, urged to elevate legs, low salt diet, re-start compression stockings, cont wt loss efforts , and will add the hct 12.5 with the hyzaar as above

## 2013-02-14 NOTE — Assessment & Plan Note (Signed)
Seasonal usually worse in the fall, pt declines rx for meds though benadryl does make sleepy, advised to consider taking OTC allegra/Nasacort if needed, or even zyrtec otc if does not cause sedation

## 2013-02-14 NOTE — Patient Instructions (Signed)
OK to stop the lisinopril Please take all new medication as prescribed - the losartan/HCT (part blood pressure, part fluid pill)  - sent to your pharmacy Please continue all other medications as before Please wear your compression stockings as you can Please keep your legs elevated, try to follow low salt diet, and dont feel that you Have to drink so much fluids every day, as this can lead to more swelling in the legs  Please remember to sign up for My Chart if you have not done so, as this will be important to you in the future with finding out test results, communicating by private email, and scheduling acute appointments online when needed.  Please keep your appointment with Dr Debby Bud as you have planned for a few months, as you will need blood work to follow up on the change in the medication

## 2013-02-14 NOTE — Assessment & Plan Note (Signed)
Not clear to me her cough and hoarseness is from the lisinopril despite her reading this on the side effect profile; as she may have allergy and post nasal gttt component or other; ok though to change the ACEI to losartan HCT 50/12.5 and f/u with PCP as planned

## 2013-02-14 NOTE — Progress Notes (Signed)
Subjective:    Patient ID: Michelle Morton, female    DOB: Aug 13, 1931, 79 y.o.   MRN: [id number]  HPI  Here to f/u as not able to see Dr Debby Bud today, but has ongoing non prod cough and hoarseness for at least the last month, but may have been longer and her sister had her lisinopril changed to losartan and her cough resolved.  Does have several wks ongoing nasal allergy symptoms with clearish congestion, itch and sneezing, without fever, pain, ST, tongue swelling or wheezing. Pt denies chest pain, increased sob or doe, wheezing, orthopnea, PND, palpitations, dizziness or syncope. But has had increased ankle edema that is gone at night, returns but later in the day, and seemed to start about the same time in the last few wks she has been purposefully drinking more fluids to try to lose wt, and her dietician told her to drink plenty of fluids.  Has compresion stockings and long hx of large LE varicosities but doesn't wear them.  Mentions benadryl makes her sleepy, really doesn't work out well with her ongoing stress over taking care of her husband with dementia.  No hx of chf, ckd Past Medical History  Diagnosis Date  . Hypertension   . Arthus phenomenon   . Osteoarthritis, knee   . Hyperlipidemia   . Diabetes mellitus, type 2   . Hypothyroidism    Past Surgical History  Procedure Laterality Date  . Abdominal hysterectomy    . Replacement total knee      left  knee '06  . Knee arthroplasty      left    reports that she quit smoking about 20 years ago. She has never used smokeless tobacco. Her alcohol and drug histories are not on file. family history includes Cancer in her daughter; Diabetes in her father; Hypertension in her father. Allergies  Allergen Reactions  . Penicillins    Current Outpatient Prescriptions on File Prior to Visit  Medication Sig Dispense Refill  . DiphenhydrAMINE HCl (BENADRYL ALLERGY PO) Take 1 tablet by mouth as needed.      . glyBURIDE (DIABETA) 5 MG tablet TAKE  ONE TABLET BY MOUTH EVERY DAY AFTER A MEAL  30 tablet  5  . ibuprofen (ADVIL,MOTRIN) 200 MG tablet Take 200 mg by mouth every 6 (six) hours as needed.        Marland Kitchen levothyroxine (SYNTHROID, LEVOTHROID) 100 MCG tablet TAKE ONE TABLET BY MOUTH EVERY DAY  30 tablet  10  . levothyroxine (SYNTHROID, LEVOTHROID) 88 MCG tablet Take 1 tablet (88 mcg total) by mouth daily.  30 tablet  11  . metFORMIN (GLUCOPHAGE) 850 MG tablet TAKE ONE TABLET BY MOUTH TWICE DAILY  180 tablet  0  . naproxen sodium (ANAPROX) 220 MG tablet Take 220 mg by mouth as needed.        . pravastatin (PRAVACHOL) 40 MG tablet TAKE ONE TABLET BY MOUTH AT BEDTIME  30 tablet  10   No current facility-administered medications on file prior to visit.   Review of Systems  Constitutional: Negative for unexpected weight change, or unusual diaphoresis  HENT: Negative for tinnitus.   Eyes: Negative for photophobia and visual disturbance.  Respiratory: Negative for choking and stridor.   Gastrointestinal: Negative for vomiting and blood in stool.  Genitourinary: Negative for hematuria and decreased urine volume.  Musculoskeletal: Negative for acute joint swelling Skin: Negative for color change and wound.  Neurological: Negative for tremors and numbness other than noted  Psychiatric/Behavioral: Negative for  decreased concentration or  hyperactivity.       Objective:   Physical Exam BP 140/80  Pulse 104  Temp(Src) 97 F (36.1 C) (Oral)  Ht 5\' 6"  (1.676 m)  Wt 184 lb 6 oz (83.632 kg)  BMI 29.77 kg/m2  SpO2 99% VS noted, not ill appearing Constitutional: Pt appears well-developed and well-nourished.  HENT: Head: NCAT.  Right Ear: External ear normal.  Left Ear: External ear normal. Bilat tm's with mild erythema.  Max sinus areas non tender.  Pharynx with mild erythema, no exudate  Eyes: Conjunctivae and EOM are normal. Pupils are equal, round, and reactive to light.  Neck: Normal range of motion. Neck supple.  Cardiovascular:  Normal rate and regular rhythm.   Pulmonary/Chest: Effort normal and breath sounds normal.  Neurological: Pt is alert. Not confused  Skin: Skin is warm. No erythema. large LE varicosities noted, NT, has trace to 1+ ankle edema Psychiatric: Pt behavior is normal. Thought content normal. mild nervous    Assessment & Plan:

## 2013-02-26 ENCOUNTER — Other Ambulatory Visit: Payer: Self-pay

## 2013-02-26 MED ORDER — METFORMIN HCL 850 MG PO TABS: 850.0000 mg | ORAL_TABLET | Freq: Two times a day (BID) | ORAL | Status: DC

## 2013-03-28 ENCOUNTER — Other Ambulatory Visit: Payer: Self-pay | Admitting: Internal Medicine

## 2013-03-28 ENCOUNTER — Ambulatory Visit (INDEPENDENT_AMBULATORY_CARE_PROVIDER_SITE_OTHER): Payer: Medicare (Managed Care)

## 2013-03-28 DIAGNOSIS — Z23 Encounter for immunization: Secondary | ICD-10-CM

## 2013-05-03 ENCOUNTER — Encounter: Payer: Self-pay | Admitting: Internal Medicine

## 2013-05-22 LAB — HM MAMMOGRAPHY

## 2013-05-28 ENCOUNTER — Other Ambulatory Visit: Payer: Self-pay | Admitting: Internal Medicine

## 2013-05-29 ENCOUNTER — Encounter: Payer: Self-pay | Admitting: Internal Medicine

## 2013-05-29 ENCOUNTER — Telehealth: Payer: Self-pay | Admitting: *Deleted

## 2013-05-29 ENCOUNTER — Ambulatory Visit (INDEPENDENT_AMBULATORY_CARE_PROVIDER_SITE_OTHER): Payer: Medicare (Managed Care) | Admitting: Internal Medicine

## 2013-05-29 ENCOUNTER — Other Ambulatory Visit (INDEPENDENT_AMBULATORY_CARE_PROVIDER_SITE_OTHER): Payer: Medicare (Managed Care)

## 2013-05-29 VITALS — BP 132/70 | HR 92 | Temp 97.0°F | Ht 66.0 in | Wt 178.4 lb

## 2013-05-29 DIAGNOSIS — Z23 Encounter for immunization: Secondary | ICD-10-CM

## 2013-05-29 DIAGNOSIS — I10 Essential (primary) hypertension: Secondary | ICD-10-CM

## 2013-05-29 DIAGNOSIS — E785 Hyperlipidemia, unspecified: Secondary | ICD-10-CM

## 2013-05-29 DIAGNOSIS — E119 Type 2 diabetes mellitus without complications: Secondary | ICD-10-CM

## 2013-05-29 DIAGNOSIS — E66812 Obesity, class 2: Secondary | ICD-10-CM

## 2013-05-29 DIAGNOSIS — Z0000 Encounter for general adult medical examination without abnormal findings: Secondary | ICD-10-CM

## 2013-05-29 DIAGNOSIS — E669 Obesity, unspecified: Secondary | ICD-10-CM

## 2013-05-29 DIAGNOSIS — E039 Hypothyroidism, unspecified: Secondary | ICD-10-CM

## 2013-05-29 LAB — HEPATIC FUNCTION PANEL
ALT: 11 U/L (ref 0–35)
AST: 16 U/L (ref 0–37)
Bilirubin, Direct: 0.1 mg/dL (ref 0.0–0.3)
Total Bilirubin: 0.8 mg/dL (ref 0.3–1.2)
Total Protein: 7.7 g/dL (ref 6.0–8.3)

## 2013-05-29 LAB — LIPID PANEL
Cholesterol: 159 mg/dL (ref 0–200)
HDL: 53.7 mg/dL (ref >39.00)
Triglycerides: 72 mg/dL (ref 0.0–149.0)
VLDL: 14.4 mg/dL (ref 0.0–40.0)

## 2013-05-29 LAB — TSH: TSH: 0.37 u[IU]/mL (ref 0.35–5.50)

## 2013-05-29 LAB — COMPREHENSIVE METABOLIC PANEL
BUN: 29 mg/dL — ABNORMAL HIGH (ref 6–23)
CO2: 26 mEq/L (ref 19–32)
Chloride: 105 mEq/L (ref 96–112)
Creatinine, Ser: 1.1 mg/dL (ref 0.4–1.2)
GFR: 59.09 mL/min — ABNORMAL LOW (ref >60.00)
Glucose, Bld: 120 mg/dL — ABNORMAL HIGH (ref 70–99)
Total Bilirubin: 0.8 mg/dL (ref 0.3–1.2)

## 2013-05-29 NOTE — Patient Instructions (Signed)
It is very good to see you.  Your exam is fine.  Routine lab work today and the result will be mailed to you. If your A1C is low we will stop the Glyburide.  Your last colonoscopy was in 2004 and if you are doing well with no change in Bowel habit we can stop screening.   You may discontinue mammograms per the [place] preventive health care task force or continue every other year, it is up to you.  Immunizations: will give Prevnar today to complete your pneumonia protection.  Have a Happy and Health New Year.

## 2013-05-29 NOTE — Telephone Encounter (Signed)
Pt called states she was instructed to verify what dose Synthroid she takes.  Pt states she takes 100 mcg.

## 2013-05-29 NOTE — Progress Notes (Signed)
Subjective:    Patient ID: Michelle Morton, female    DOB: [birth year], 79 y.o.   MRN: [id number]  HPI The patient is here for annual Medicare wellness examination and management of other chronic and acute problems.  Interval history: she has lost weight down to 178 with a goal of 175. She has recurrent peripheral edema that is worse when being up and walking. She has normal BMs but there is a lot of noise. Bruxism - she grinds her teeth at night. She is concerned about DM management and whether she can come off medications.    The risk factors are reflected in the social history.  The roster of all physicians providing medical care to patient - is listed in the Snapshot section of the chart.  Activities of daily living:  The patient is 100% inedpendent in all ADLs: dressing, toileting, feeding as well as independent mobility  Home safety : The patient has smoke detectors in the home. Falls - no falls. They wear seatbelts. No firearms at home. There is no violence in the home.   There is no risks for hepatitis, STDs or HIV. There is no   history of blood transfusion. They have no travel history to infectious disease endemic areas of the world.  The patient has seen their dentist in the last six month. They have seen their eye doctor in the last year. They admit to hearing difficulty right ear when using the telephone.and have not had audiologic testing in the last year.    They do not  have excessive sun exposure. Discussed the need for sun protection: hats, long sleeves and use of sunscreen if there is significant sun exposure.   Diet: the importance of a healthy diet is discussed. They do have a healthy diet.  The patient has a regular exercise program: walking ,20 min duration, 3 per week.  The benefits of regular aerobic exercise were discussed.  Depression screen: there are no signs or vegative symptoms of depression- irritability, change in appetite, anhedonia,  sadness/tearfullness.  Cognitive assessment: the patient manages all their financial and personal affairs and is actively engaged.   The following portions of the patient's history were reviewed and updated as appropriate: allergies, current medications, past family history, past medical history,  past surgical history, past social history  and problem list.  Vision, hearing, body mass index were assessed and reviewed.   During the course of the visit the patient was educated and counseled about appropriate screening and preventive services including : fall prevention , diabetes screening, nutrition counseling, colorectal cancer screening, and recommended immunizations.  Past Medical History  Diagnosis Date  . Hypertension   . Arthus phenomenon   . Osteoarthritis, knee   . Hyperlipidemia   . Diabetes mellitus, type 2   . Hypothyroidism   . Allergic rhinitis, cause unspecified 02/14/2013   Past Surgical History  Procedure Laterality Date  . Abdominal hysterectomy    . Replacement total knee      left  knee '06  . Knee arthroplasty      left   Family History  Problem Relation Age of Onset  . Diabetes Father   . Hypertension Father   . Cancer Daughter     breast cancer survior   History   Social History  . Marital Status: Married    Spouse Name: N/A    Number of Children: N/A  . Years of Education: N/A   Occupational History  . Not on file.  Social History Main Topics  . Smoking status: Former Smoker    Quit date: 05/10/1992  . Smokeless tobacco: Never Used  . Alcohol Use: No  . Drug Use: No  . Sexual Activity: Not on file   Other Topics Concern  . 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    Current  Outpatient Prescriptions on File Prior to Visit  Medication Sig Dispense Refill  . DiphenhydrAMINE HCl (BENADRYL ALLERGY PO) Take 1 tablet by mouth as needed.      . glyBURIDE (DIABETA) 5 MG tablet TAKE ONE TABLET BY MOUTH ONCE DAILY AFTER A MEAL  30 tablet  5  . ibuprofen (ADVIL,MOTRIN) 200 MG tablet Take 200 mg by mouth every 6 (six) hours as needed.        Marland Kitchen losartan-hydrochlorothiazide (HYZAAR) 50-12.5 MG per tablet Take 1 tablet by mouth daily.  30 tablet  11  . metFORMIN (GLUCOPHAGE) 850 MG tablet Take 1 tablet (850 mg total) by mouth 2 (two) times daily with a meal.  180 tablet  3  . naproxen sodium (ANAPROX) 220 MG tablet Take 220 mg by mouth as needed.        . pravastatin (PRAVACHOL) 40 MG tablet TAKE ONE TABLET BY MOUTH EVERY DAY AT BEDTIME  30 tablet  5  . levothyroxine (SYNTHROID, LEVOTHROID) 100 MCG tablet TAKE ONE TABLET BY MOUTH EVERY DAY  30 tablet  10  . levothyroxine (SYNTHROID, LEVOTHROID) 88 MCG tablet Take 1 tablet (88 mcg total) by mouth daily.  30 tablet  11   No current facility-administered medications on file prior to visit.       Review of Systems Constitutional:  Negative for fever, chills, activity change and unexpected weight change.  HEENT:  Negative for hearing loss, ear pain, congestion, neck stiffness and postnasal drip. Negative for sore throat or swallowing problems. Negative for dental complaints.   Eyes: Negative for vision loss or change in visual acuity.  Respiratory: Negative for chest tightness and wheezing. Negative for DOE.   Cardiovascular: Negative for chest pain or palpitations. No decreased exercise tolerance Gastrointestinal: No change in bowel habit. No bloating or gas. No reflux or indigestion Genitourinary: Negative for urgency, frequency, flank pain and difficulty urinating.  Musculoskeletal: Negative for myalgias, back pain, arthralgias and gait problem.  Neurological: Negative for dizziness, tremors, weakness and headaches.   Hematological: Negative for adenopathy.  Psychiatric/Behavioral: Negative for behavioral problems and dysphoric mood.        Objective:   Physical Exam Filed Vitals:   05/29/13 0858  BP: 132/70  Pulse: 92  Temp: 97 F (36.1 C)   Wt Readings from Last 3 Encounters:  05/29/13 178 lb 6.4 oz (80.922 kg)  02/14/13 184 lb 6 oz (83.632 kg)  05/17/12 191 lb 1.3 oz (86.673 kg)   Gen'l: well nourished, well developed  Woman in no distress HEENT - Randall/AT, EACs/TMs normal, oropharynx with native dentition in good condition, no buccal or palatal lesions, posterior pharynx clear, mucous membranes moist. C&S clear, PERRLA, fundi - normal Neck - supple, no thyromegaly Nodes- negative submental, cervical, supraclavicular regions Chest - no deformity, no CVAT Lungs - clear without rales, wheezes. No increased work of  breathing Breast - deferred to mammography Cardiovascular - regular rate and rhythm, quiet precordium, no murmurs, rubs or gallops, 2+ radial, DP and PT pulses Abdomen - BS+ x 4, no HSM, no guarding or rebound or tenderness Pelvic - deferred to gyn Rectal - deferred to gyn Extremities - no clubbing, cyanosis, edema or deformity.  Neuro - A&O x 3, CN II-XII normal, motor strength normal and equal, DTRs 2+ and symmetrical biceps, radial, and patellar tendons. Cerebellar - no tremor, no rigidity, fluid movement and normal gait. Derm - Head, neck, back, abdomen and extremities without suspicious lesions  Recent Results (from the past 2160 hour(s))  HEPATIC FUNCTION PANEL     Status: None   Collection Time    05/29/13 10:00 AM      Result Value Range   Total Bilirubin 0.8  0.3 - 1.2 mg/dL   Bilirubin, Direct 0.1  0.0 - 0.3 mg/dL   Alkaline Phosphatase 53  39 - 117 U/L   AST 16  0 - 37 U/L   ALT 11  0 - 35 U/L   Total Protein 7.7  6.0 - 8.3 g/dL   Albumin 4.1  3.5 - 5.2 g/dL  TSH     Status: None   Collection Time    05/29/13 10:00 AM      Result Value Range   TSH 0.37  0.35 -  5.50 uIU/mL  COMPREHENSIVE METABOLIC PANEL     Status: Abnormal   Collection Time    05/29/13 10:00 AM      Result Value Range   Sodium 140  135 - 145 mEq/L   Potassium 4.0  3.5 - 5.1 mEq/L   Chloride 105  96 - 112 mEq/L   CO2 26  19 - 32 mEq/L   Glucose, Bld 120 (*) 70 - 99 mg/dL   BUN 29 (*) 6 - 23 mg/dL   Creatinine, Ser 1.1  0.4 - 1.2 mg/dL   Total Bilirubin 0.8  0.3 - 1.2 mg/dL   Alkaline Phosphatase 53  39 - 117 U/L   AST 16  0 - 37 U/L   ALT 11  0 - 35 U/L   Total Protein 7.7  6.0 - 8.3 g/dL   Albumin 4.1  3.5 - 5.2 g/dL   Calcium 9.5  8.4 - 09.8 mg/dL   GFR 11.91 (*) >47.82 mL/min  LIPID PANEL     Status: None   Collection Time    05/29/13 10:00 AM      Result Value Range   Cholesterol 159  0 - 200 mg/dL   Comment: ATP III Classification       Desirable:  < 200 mg/dL               Borderline High:  200 - 239 mg/dL          High:  > = 956 mg/dL   Triglycerides 21.3  0.0 - 149.0 mg/dL   Comment: Normal:  <086 mg/dLBorderline High:  150 - 199 mg/dL   HDL 57.84  >69.62 mg/dL   VLDL 95.2  0.0 - 84.1 mg/dL   LDL Cholesterol 91  0 - 99 mg/dL   Total CHOL/HDL Ratio 3     Comment:                Men          Women1/2 Average Risk     3.4          3.3Average Risk  5.0          4.42X Average Risk          9.6          7.13X Average Risk          15.0          11.0                      HEMOGLOBIN A1C     Status: Abnormal   Collection Time    05/29/13 10:00 AM      Result Value Range   Hemoglobin A1C 6.7 (*) 4.6 - 6.5 %   Comment: Glycemic Control Guidelines for People with Diabetes:Non Diabetic:  <6%Goal of Therapy: <7%Additional Action Suggested:  >8%          Assessment & Plan:

## 2013-05-29 NOTE — Progress Notes (Signed)
Pre visit review using our clinic review tool, if applicable. No additional management support is needed unless otherwise documented below in the visit note. 

## 2013-05-29 NOTE — Telephone Encounter (Signed)
This has been up dated on her medication list -

## 2013-05-30 NOTE — Assessment & Plan Note (Signed)
Doing well - has lost weight. Reports CBGs at home with good control. Would like to reduce medications  Plan A1C with recommendations to follow.  Addendum A1C 6.7%  Plan Continue present medications

## 2013-05-30 NOTE — Assessment & Plan Note (Signed)
Lab Results  Component Value Date   TSH 0.37 05/29/2013   Good control on present medication

## 2013-05-30 NOTE — Assessment & Plan Note (Signed)
Interval history notable for continued weight loss. Physical exam is ok. Lab results reviewed - in normal range. She is current with colorectal and breast cancer screening. Immunizations are up to date including Prevnar.  In summary A very nice woman who is medically stable and doing well.

## 2013-05-30 NOTE — Assessment & Plan Note (Signed)
Body mass index is 28.81 kg/(m^2). Doing a great job of weight reduction - now down to 178 lbs ! (38 lbs off)  Plan  Continue present regimen with a target weight of 175.

## 2013-05-30 NOTE — Assessment & Plan Note (Signed)
Taking and tolerating  "Statin" therapy.   Plan  Lab f/u  Addendum  LDL better than goal of 100 or less. Liver functions normal.

## 2013-05-30 NOTE — Assessment & Plan Note (Signed)
BP Readings from Last 3 Encounters:  05/29/13 132/70  02/14/13 140/80  05/17/12 130/80   Good control on present medications.

## 2013-06-02 ENCOUNTER — Encounter: Payer: Self-pay | Admitting: Internal Medicine

## 2013-06-15 ENCOUNTER — Encounter: Payer: Self-pay | Admitting: Internal Medicine

## 2013-06-26 ENCOUNTER — Other Ambulatory Visit: Payer: Self-pay | Admitting: Internal Medicine

## 2013-07-02 ENCOUNTER — Telehealth: Payer: Self-pay | Admitting: *Deleted

## 2013-07-02 NOTE — Telephone Encounter (Signed)
Patient phoned stating that her insurance company has since d/c'ed coverage of her presently prescribed glyburide.  Please advise.   CB# [phone]

## 2013-07-02 NOTE — Telephone Encounter (Signed)
Does she know what her formulary covers? Can order amaryl 2 mg once a day if covered.

## 2013-07-03 NOTE — Telephone Encounter (Signed)
Notified patient of MD's response.  She is going to contact insurance company to see if they'll cover amaryl & if they will not cover it, ask what they WILL cover in place of the glyburide.

## 2013-07-04 ENCOUNTER — Telehealth: Payer: Self-pay | Admitting: *Deleted

## 2013-07-04 MED ORDER — GLIMEPIRIDE 2 MG PO TABS: 2.0000 mg | ORAL_TABLET | Freq: Every day | ORAL | Status: DC

## 2013-07-04 NOTE — Telephone Encounter (Signed)
Rx done and eScribed

## 2013-07-04 NOTE — Telephone Encounter (Signed)
Patient phoned back stating her insurance company will approve amaryl 2mg  once daily.  Please advise.  States she has enough until the end of the month of glyburide.  CB# [phone]

## 2013-07-05 NOTE — Telephone Encounter (Signed)
Phoned and left voicemail message notifying patient of MD's response and prescription.

## 2013-08-23 ENCOUNTER — Telehealth: Payer: Self-pay | Admitting: Internal Medicine

## 2013-08-23 NOTE — Telephone Encounter (Signed)
Called patient: symptoms as described. Also - night sweats. May be hypoglycemia related to amaryl in a setting of a GI bug.  Plan Stop amaryl  Eat  If not better by early afternoon call the office to be put on the Saturday schedule.

## 2013-08-23 NOTE — Telephone Encounter (Signed)
Patient Information:  [facility]  Phone: [phone]  Patient: Michelle KnudsenHunt, Philana W  Gender: Female  DOB: May 19, 1932  Age: 80 Years  PCP: Illene RegulusNorins, Michael (Adults only)  Office Follow Up:  Does the office need to follow up with this patient?: Yes  Instructions For The Office: Please contact patient regarding medication and symptoms.  Care advice provided.  RN Note:  Please contact patient regarding medication and symptoms.  Care advice provided.  Symptoms  Reason For Call & Symptoms: Patient was placed on Amaryl 2mg  and began taking 07/28/13 .  (She was switched from glyburide due insurance ).  She states that she feels "dizzy headed like with nausea"  and having vomiting episodes since  Wednesday night 08/21/13.  Emesis x2  yesterday and once today.   She is also taking Hyzzar. 50-12.5mg .   She feels like this is medication related to the Amaryl.  Reviewed Health History In EMR: Yes  Reviewed Medications In EMR: Yes  Reviewed Allergies In EMR: Yes  Reviewed Surgeries / Procedures: Yes  Date of Onset of Symptoms: 08/21/2013  Treatments Tried: Left off the Amaryl today.  Treatments Tried Worked: No  Guideline(s) Used:  Weakness (Generalized) and Fatigue  Dizziness  Disposition Per Guideline:   Discuss with PCP and Callback by Nurse Today  Reason For Disposition Reached:   Taking a medicine that could cause dizziness (e.g., blood pressure medications, diuretics)  Advice Given:  Drink Fluids:  Drink several glasses of fruit juice, other clear fluids, or water. This will improve hydration and blood glucose. If you have a fever or have had heat exposure, make sure the fluids are cold.  Cool Off:  If the weather is hot, apply a cold compress to the forehead or take a cool shower or bath.  Rest for 1-2 Hours:  Lie down with feet elevated for 1 hour. This will improve blood flow and increase blood flow to the brain.  Call Back If:  Still feel dizzy after 2 hours of rest and  fluids  Passes out (faints)  You become worse.  RN Overrode Recommendation:  Document Patient  Please contact patient regarding medication and symptoms.  Care advice provided.

## 2013-08-25 ENCOUNTER — Ambulatory Visit (INDEPENDENT_AMBULATORY_CARE_PROVIDER_SITE_OTHER): Payer: Medicare (Managed Care) | Admitting: Family Medicine

## 2013-08-25 ENCOUNTER — Encounter: Payer: Self-pay | Admitting: Family Medicine

## 2013-08-25 VITALS — BP 148/70 | HR 96 | Temp 97.0°F | Resp 20 | Ht 66.0 in | Wt 180.0 lb

## 2013-08-25 DIAGNOSIS — E162 Hypoglycemia, unspecified: Secondary | ICD-10-CM

## 2013-08-25 NOTE — Progress Notes (Signed)
  Subjective:     Michelle KnudsenMamie W Colomb is a 80 y.o. female who presents for evaluation of nausea and vomiting. Onset of symptoms was 4 days ago. Patient describes nausea as moderate. Vomiting has occurred 6 times over the past 4 days. Vomitus is described as normal gastric contents. Symptoms have been associated with low blood sugar on amaryl--amaryl stopped Thursday. Patient denies alcohol overuse, fever, hematemesis and melena. Symptoms have stabilized. Evaluation to date has been none. Treatment to date has been none. --- except stopping the amaryl.  The following portions of the patient's history were reviewed and updated as appropriate: allergies, current medications, past family history, past medical history, past social history, past surgical history and problem list.  Review of Systems Pertinent items are noted in HPI.   Objective:    BP 148/70  Pulse 96  Temp(Src) 97 F (36.1 C) (Oral)  Resp 20  Ht 5\' 6"  (1.676 m)  Wt 180 lb (81.647 kg)  BMI 29.07 kg/m2  SpO2 99% General appearance: alert, cooperative, appears stated age and no distress Eyes: negative findings: lids and lashes normal, conjunctivae and sclerae normal and pupils equal, round, reactive to light and accomodation Ears: normal TM's and external ear canals both ears Nose: Nares normal. Septum midline. Mucosa normal. No drainage or sinus tenderness. Throat: lips, mucosa, and tongue normal; teeth and gums normal Neck: no adenopathy, supple, symmetrical, trachea midline and thyroid not enlarged, symmetric, no tenderness/mass/nodules Lungs: clear to auscultation bilaterally Heart: S1, S2 normal   Assessment:    Nausea without vomiting Dizziness with hypoglycemia   ---   Plan:  Symptoms improving  stop amaryl and f/u pcp next week  If symptoms worsen go to ER

## 2013-08-25 NOTE — Patient Instructions (Signed)

## 2013-08-25 NOTE — Progress Notes (Signed)
Pre-visit discussion using our clinic review tool. No additional management support is needed unless otherwise documented below in the visit note.  

## 2013-08-30 ENCOUNTER — Encounter: Payer: Self-pay | Admitting: Internal Medicine

## 2013-08-30 ENCOUNTER — Ambulatory Visit (INDEPENDENT_AMBULATORY_CARE_PROVIDER_SITE_OTHER): Payer: Medicare (Managed Care) | Admitting: Internal Medicine

## 2013-08-30 ENCOUNTER — Other Ambulatory Visit (INDEPENDENT_AMBULATORY_CARE_PROVIDER_SITE_OTHER): Payer: Medicare (Managed Care)

## 2013-08-30 ENCOUNTER — Other Ambulatory Visit: Payer: Self-pay | Admitting: Internal Medicine

## 2013-08-30 VITALS — BP 122/80 | HR 91 | Temp 97.1°F | Ht 66.0 in | Wt 182.0 lb

## 2013-08-30 DIAGNOSIS — E119 Type 2 diabetes mellitus without complications: Secondary | ICD-10-CM

## 2013-08-30 DIAGNOSIS — E538 Deficiency of other specified B group vitamins: Secondary | ICD-10-CM

## 2013-08-30 DIAGNOSIS — R5381 Other malaise: Secondary | ICD-10-CM | POA: Insufficient documentation

## 2013-08-30 DIAGNOSIS — R5383 Other fatigue: Secondary | ICD-10-CM

## 2013-08-30 DIAGNOSIS — E785 Hyperlipidemia, unspecified: Secondary | ICD-10-CM

## 2013-08-30 DIAGNOSIS — R609 Edema, unspecified: Secondary | ICD-10-CM

## 2013-08-30 DIAGNOSIS — R42 Dizziness and giddiness: Secondary | ICD-10-CM | POA: Insufficient documentation

## 2013-08-30 DIAGNOSIS — I10 Essential (primary) hypertension: Secondary | ICD-10-CM

## 2013-08-30 DIAGNOSIS — E039 Hypothyroidism, unspecified: Secondary | ICD-10-CM

## 2013-08-30 DIAGNOSIS — N814 Uterovaginal prolapse, unspecified: Secondary | ICD-10-CM

## 2013-08-30 LAB — BASIC METABOLIC PANEL
BUN: 33 mg/dL — AB (ref 6–23)
CALCIUM: 9.5 mg/dL (ref 8.4–10.5)
CO2: 26 mEq/L (ref 19–32)
Chloride: 96 mEq/L (ref 96–112)
Creatinine, Ser: 1.2 mg/dL (ref 0.4–1.2)
GFR: 57.88 mL/min — AB (ref >60.00)
GLUCOSE: 137 mg/dL — AB (ref 70–99)
Potassium: 4.3 mEq/L (ref 3.5–5.1)
Sodium: 136 mEq/L (ref 135–145)

## 2013-08-30 LAB — LIPID PANEL
CHOLESTEROL: 155 mg/dL (ref 0–200)
HDL: 57.6 mg/dL (ref >39.00)
LDL Cholesterol: 86 mg/dL (ref 0–99)
Total CHOL/HDL Ratio: 3
Triglycerides: 59 mg/dL (ref 0.0–149.0)
VLDL: 11.8 mg/dL (ref 0.0–40.0)

## 2013-08-30 LAB — CBC WITH DIFFERENTIAL/PLATELET
Basophils Absolute: 0 10*3/uL (ref 0.0–0.1)
Basophils Relative: 0.4 % (ref 0.0–3.0)
Eosinophils Absolute: 0.3 10*3/uL (ref 0.0–0.7)
Eosinophils Relative: 3.3 % (ref 0.0–5.0)
HEMATOCRIT: 41.6 % (ref 36.0–46.0)
HEMOGLOBIN: 13.4 g/dL (ref 12.0–15.0)
LYMPHS ABS: 3.1 10*3/uL (ref 0.7–4.0)
Lymphocytes Relative: 40.2 % (ref 12.0–46.0)
MCHC: 32.3 g/dL (ref 30.0–36.0)
MCV: 91.8 fl (ref 78.0–100.0)
MONOS PCT: 4.9 % (ref 3.0–12.0)
Monocytes Absolute: 0.4 10*3/uL (ref 0.1–1.0)
NEUTROS ABS: 4 10*3/uL (ref 1.4–7.7)
Neutrophils Relative %: 51.2 % (ref 43.0–77.0)
Platelets: 330 10*3/uL (ref 150.0–400.0)
RBC: 4.53 Mil/uL (ref 3.87–5.11)
RDW: 14.1 % (ref 11.5–14.6)
WBC: 7.8 10*3/uL (ref 4.5–10.5)

## 2013-08-30 LAB — URINALYSIS, ROUTINE W REFLEX MICROSCOPIC
Bilirubin Urine: NEGATIVE
KETONES UR: NEGATIVE
Nitrite: NEGATIVE
SPECIFIC GRAVITY, URINE: 1.015 (ref 1.000–1.030)
URINE GLUCOSE: NEGATIVE
Urobilinogen, UA: 0.2 (ref 0.0–1.0)
pH: 6 (ref 5.0–8.0)

## 2013-08-30 LAB — HEPATIC FUNCTION PANEL
ALT: 13 U/L (ref 0–35)
AST: 17 U/L (ref 0–37)
Albumin: 4.2 g/dL (ref 3.5–5.2)
Alkaline Phosphatase: 57 U/L (ref 39–117)
Bilirubin, Direct: 0.1 mg/dL (ref 0.0–0.3)
Total Bilirubin: 0.6 mg/dL (ref 0.3–1.2)
Total Protein: 7.9 g/dL (ref 6.0–8.3)

## 2013-08-30 LAB — TSH: TSH: 0.38 u[IU]/mL (ref 0.35–5.50)

## 2013-08-30 LAB — HEMOGLOBIN A1C: HEMOGLOBIN A1C: 6.6 % — AB (ref 4.6–6.5)

## 2013-08-30 LAB — VITAMIN B12: Vitamin B-12: 295 pg/mL (ref 211–911)

## 2013-08-30 MED ORDER — CIPROFLOXACIN HCL 500 MG PO TABS: 500.0000 mg | ORAL_TABLET | Freq: Two times a day (BID) | ORAL | Status: DC

## 2013-08-30 MED ORDER — GLUCOSE BLOOD VI STRP: ORAL_STRIP | Status: DC

## 2013-08-30 MED ORDER — LANCETS MISC: Status: DC

## 2013-08-30 NOTE — Patient Instructions (Signed)
OK to stay off the amaryl (glimeparide)  Please continue all other medications as before, and refills have been done if requested. Please have the pharmacy call with any other refills you may need.  You are given the glucometer and rx for supplies today  You are given the rx for the compression stockings as well  You will be contacted regarding the referral for: GYN  Please go to the LAB in the Basement (turn left off the elevator) for the tests to be done today You will be contacted by phone if any changes need to be made immediately.  Otherwise, you will receive a letter about your results with an explanation, but please check with MyChart first.  Please return in 3 months, or sooner if needed

## 2013-08-30 NOTE — Progress Notes (Signed)
Subjective:    Patient ID: Michelle Morton, female    DOB: Sep 04, 1931, 80 y.o.   MRN: [id number]  HPI  Here to f/u recent hypoglycemia episode persistent, required hospn, now off sulfonyurea, Here to f/u; overall doing ok,  Pt denies chest pain, increased sob or doe, wheezing, orthopnea, PND, increased LE swelling (though still has some chronic venous insuff), palpitations, or syncope.  Has recurring dizziness for several months, no falls.  Pt denies polydipsia, polyuria, or low sugar symptoms such as weakness or confusion improved with po intake.  Pt denies new neurological symptoms such as new headache, or facial or extremity weakness or numbness.   Pt states overall good compliance with meds, has been trying to follow lower cholesterol, diabetic diet, with wt overall stable, needs new glucometer.  Does c/o ongoing fatigue, but denies signficant daytime hypersomnolence.  ALso mentions sensation pelvic suggestive of uterine prolapse, asks for GYN referral.  Has been under more stressors lately.Denies worsening depressive symptoms, suicidal ideation, or panic Past Medical History  Diagnosis Date  . Hypertension   . Arthus phenomenon   . Osteoarthritis, knee   . Hyperlipidemia   . Diabetes mellitus, type 2   . Hypothyroidism   . Allergic rhinitis, cause unspecified 02/14/2013   Past Surgical History  Procedure Laterality Date  . Abdominal hysterectomy    . Replacement total knee      left  knee '06  . Knee arthroplasty      left    reports that she quit smoking about 21 years ago. She has never used smokeless tobacco. She reports that she does not drink alcohol or use illicit drugs. family history includes Cancer in her daughter; Diabetes in her father; Hypertension in her father. Allergies  Allergen Reactions  . Penicillins    Current Outpatient Prescriptions on File Prior to Visit  Medication Sig Dispense Refill  . cetirizine (ZYRTEC) 10 MG tablet Take 10 mg by mouth daily.      Marland Kitchen.  ibuprofen (ADVIL,MOTRIN) 200 MG tablet Take 200 mg by mouth every 6 (six) hours as needed.        Marland Kitchen. levothyroxine (SYNTHROID, LEVOTHROID) 100 MCG tablet TAKE ONE TABLET BY MOUTH ONCE DAILY  30 tablet  5  . losartan-hydrochlorothiazide (HYZAAR) 50-12.5 MG per tablet Take 1 tablet by mouth daily.  30 tablet  11  . metFORMIN (GLUCOPHAGE) 850 MG tablet Take 1 tablet (850 mg total) by mouth 2 (two) times daily with a meal.  180 tablet  3  . naproxen sodium (ANAPROX) 220 MG tablet Take 220 mg by mouth as needed.        . pravastatin (PRAVACHOL) 40 MG tablet TAKE ONE TABLET BY MOUTH EVERY DAY AT BEDTIME  30 tablet  5   No current facility-administered medications on file prior to visit.   Review of Systems  Constitutional: Negative for unexpected weight change, or unusual diaphoresis  HENT: Negative for tinnitus.   Eyes: Negative for photophobia and visual disturbance.  Respiratory: Negative for choking and stridor.   Gastrointestinal: Negative for vomiting and blood in stool.  Genitourinary: Negative for hematuria and decreased urine volume.  Musculoskeletal: Negative for acute joint swelling Skin: Negative for color change and wound.  Neurological: Negative for tremors and numbness other than noted  Psychiatric/Behavioral: Negative for decreased concentration or  hyperactivity.       Objective:   Physical Exam BP 122/80  Pulse 91  Temp(Src) 97.1 F (36.2 C) (Oral)  Ht 5\' 6"  (1.676  m)  Wt 182 lb (82.555 kg)  BMI 29.39 kg/m2  SpO2 98% VS noted,  Not ill appaering Constitutional: Pt appears well-developed and well-nourished.  HENT: Head: NCAT.  Right Ear: External ear normal.  Left Ear: External ear normal.  Eyes: Conjunctivae and EOM are normal. Pupils are equal, round, and reactive to light.  Neck: Normal range of motion. Neck supple.  Cardiovascular: Normal rate and regular rhythm.   Pulmonary/Chest: Effort normal and breath sounds normal.  Abd:  Soft, NT, non-distended, +  BS Neurological: Pt is alert. Motor 5/5 Skin: Skin is warm. No erythema.  Psychiatric: Pt behavior is normal.    Assessment & Plan:

## 2013-08-30 NOTE — Progress Notes (Signed)
Pre visit review using our clinic review tool, if applicable. No additional management support is needed unless otherwise documented below in the visit note. 

## 2013-09-01 DIAGNOSIS — N814 Uterovaginal prolapse, unspecified: Secondary | ICD-10-CM | POA: Insufficient documentation

## 2013-09-01 NOTE — Assessment & Plan Note (Addendum)
Etiology unclear, Exam otherwise benign, to check labs as documented, follow with expectant management, for UA, cbc

## 2013-09-01 NOTE — Assessment & Plan Note (Signed)
stable overall by history and exam, recent data reviewed with pt, and pt to continue medical treatment as before,  to f/u any worsening symptoms or concerns Lab Results  Component Value Date   LDLCALC 86 08/30/2013    

## 2013-09-01 NOTE — Assessment & Plan Note (Signed)
C/w venous insuff - for compression stockings

## 2013-09-01 NOTE — Assessment & Plan Note (Signed)
Possible, for GYN referral

## 2013-09-01 NOTE — Assessment & Plan Note (Addendum)
Ok to stay off amaryl, for f/u at 3 mo with a1c  Note:  Total time for pt hx, exam, review of record with pt in the room, determination of diagnoses and plan for further eval and tx is > 40 min, with over 50% spent in coordination and counseling of patient

## 2013-09-01 NOTE — Assessment & Plan Note (Signed)
stable overall by history and exam, recent data reviewed with pt, and pt to continue medical treatment as before,  to f/u any worsening symptoms or concerns Lab Results  Component Value Date   TSH 0.38 08/30/2013

## 2013-09-01 NOTE — Assessment & Plan Note (Signed)
Unclear etiology, ? Related to lower volume with elev glucose?

## 2013-09-01 NOTE — Assessment & Plan Note (Signed)
stable overall by history and exam, recent data reviewed with pt, and pt to continue medical treatment as before,  to f/u any worsening symptoms or concerns BP Readings from Last 3 Encounters:  08/30/13 122/80  08/25/13 148/70  05/29/13 132/70

## 2013-09-03 ENCOUNTER — Telehealth: Payer: Self-pay | Admitting: Internal Medicine

## 2013-09-03 NOTE — Telephone Encounter (Signed)
Relevant patient education mailed to patient.  

## 2013-10-30 ENCOUNTER — Telehealth: Payer: Self-pay | Admitting: *Deleted

## 2013-10-30 NOTE — Telephone Encounter (Signed)
Pt called states she has severe chapped lips and hoarseness.  She believes this may be due to her "Statin" use.  Please advise

## 2013-10-30 NOTE — Telephone Encounter (Signed)
This is VERY unlikely due to statin use; more likely due to dry skin, dehydration, mouth breathing, or other skin condition, ok to use chap stick or vaseline , consider derm if not improved next few days

## 2013-10-30 NOTE — Telephone Encounter (Signed)
Called the patient informed of MD instructions. 

## 2013-11-28 ENCOUNTER — Other Ambulatory Visit: Payer: Self-pay

## 2013-11-28 MED ORDER — PRAVASTATIN SODIUM 40 MG PO TABS
40.0000 mg | ORAL_TABLET | Freq: Every day | ORAL | Status: DC
Start: 2013-11-28 — End: 2014-09-02

## 2013-11-29 ENCOUNTER — Ambulatory Visit (INDEPENDENT_AMBULATORY_CARE_PROVIDER_SITE_OTHER): Payer: Medicare (Managed Care) | Admitting: Internal Medicine

## 2013-11-29 ENCOUNTER — Encounter: Payer: Self-pay | Admitting: Internal Medicine

## 2013-11-29 ENCOUNTER — Telehealth: Payer: Self-pay

## 2013-11-29 ENCOUNTER — Ambulatory Visit (INDEPENDENT_AMBULATORY_CARE_PROVIDER_SITE_OTHER): Payer: Medicare (Managed Care)

## 2013-11-29 VITALS — BP 128/66 | HR 91 | Temp 98.2°F | Wt 177.5 lb

## 2013-11-29 DIAGNOSIS — Z0000 Encounter for general adult medical examination without abnormal findings: Secondary | ICD-10-CM

## 2013-11-29 DIAGNOSIS — E119 Type 2 diabetes mellitus without complications: Secondary | ICD-10-CM

## 2013-11-29 DIAGNOSIS — I10 Essential (primary) hypertension: Secondary | ICD-10-CM

## 2013-11-29 DIAGNOSIS — R609 Edema, unspecified: Secondary | ICD-10-CM

## 2013-11-29 DIAGNOSIS — E785 Hyperlipidemia, unspecified: Secondary | ICD-10-CM

## 2013-11-29 LAB — HEMOGLOBIN A1C: HEMOGLOBIN A1C: 6.8 % — AB (ref 4.6–6.5)

## 2013-11-29 NOTE — Patient Instructions (Signed)

## 2013-11-29 NOTE — Progress Notes (Signed)
Pre visit review using our clinic review tool, if applicable. No additional management support is needed unless otherwise documented below in the visit note. 

## 2013-11-29 NOTE — Telephone Encounter (Signed)
Labs entered.

## 2013-11-29 NOTE — Progress Notes (Signed)
Subjective:    Patient ID: Michelle Morton, female    DOB: [birth year], 80 y.o.   MRN: [id number]  HPI  Here to f/u; overall doing ok,  Pt denies chest pain, increased sob or doe, wheezing, orthopnea, PND, increased LE swelling, palpitations, dizziness or syncope.  Pt denies polydipsia, polyuria, or low sugar symptoms such as weakness or confusion improved with po intake.  Pt denies new neurological symptoms such as new headache, or facial or extremity weakness or numbness.   Pt states overall good compliance with meds, has been trying to follow lower cholesterol, diabetic diet, with wt overall stable,  but little exercise however.  No low sugars or polys off the glimeparide  Did not see GYN as symptoms improved.  Swelling better to ankles/legs as wears compression stockings. Past Medical History  Diagnosis Date  . Hypertension   . Arthus phenomenon   . Osteoarthritis, knee   . Hyperlipidemia   . Diabetes mellitus, type 2   . Hypothyroidism   . Allergic rhinitis, cause unspecified 02/14/2013   Past Surgical History  Procedure Laterality Date  . Abdominal hysterectomy    . Replacement total knee      left  knee '06  . Knee arthroplasty      left    reports that she quit smoking about 21 years ago. She has never used smokeless tobacco. She reports that she does not drink alcohol or use illicit drugs. family history includes Cancer in her daughter; Diabetes in her father; Hypertension in her father. Allergies  Allergen Reactions  . Penicillins    Current Outpatient Prescriptions on File Prior to Visit  Medication Sig Dispense Refill  . cetirizine (ZYRTEC) 10 MG tablet Take 10 mg by mouth daily.      Marland Kitchen. glucose blood test strip Use as instructed  250.02  100 each  12  . ibuprofen (ADVIL,MOTRIN) 200 MG tablet Take 200 mg by mouth every 6 (six) hours as needed.        . Lancets MISC Use as directed 1 per day  100 each  12  . levothyroxine (SYNTHROID, LEVOTHROID) 100 MCG tablet TAKE ONE  TABLET BY MOUTH ONCE DAILY  30 tablet  5  . losartan-hydrochlorothiazide (HYZAAR) 50-12.5 MG per tablet Take 1 tablet by mouth daily.  30 tablet  11  . metFORMIN (GLUCOPHAGE) 850 MG tablet Take 1 tablet (850 mg total) by mouth 2 (two) times daily with a meal.  180 tablet  3  . naproxen sodium (ANAPROX) 220 MG tablet Take 220 mg by mouth as needed.        . pravastatin (PRAVACHOL) 40 MG tablet Take 1 tablet (40 mg total) by mouth daily.  30 tablet  8   No current facility-administered medications on file prior to visit.    Review of Systems  Constitutional: Negative for unusual diaphoresis or other sweats  HENT: Negative for ringing in ear Eyes: Negative for double vision or worsening visual disturbance.  Respiratory: Negative for choking and stridor.   Gastrointestinal: Negative for vomiting or other signifcant bowel change Genitourinary: Negative for hematuria or decreased urine volume.  Musculoskeletal: Negative for other MSK pain or swelling Skin: Negative for color change and worsening wound.  Neurological: Negative for tremors and numbness other than noted  Psychiatric/Behavioral: Negative for decreased concentration or agitation other than above       Objective:   Physical Exam BP 128/66  Pulse 91  Temp(Src) 98.2 F (36.8 C) (Oral)  Wt 177  lb 8 oz (80.513 kg)  SpO2 98% VS noted,  Constitutional: Pt appears well-developed, well-nourished.  HENT: Head: NCAT.  Right Ear: External ear normal.  Left Ear: External ear normal.  Eyes: . Pupils are equal, round, and reactive to light. Conjunctivae and EOM are normal Neck: Normal range of motion. Neck supple.  Cardiovascular: Normal rate and regular rhythm.   Pulmonary/Chest: Effort normal and breath sounds normal.  Neurological: Pt is alert. Not confused , motor grossly intact Skin: Skin is warm. No rash Psychiatric: Pt behavior is normal. No agitation.     Assessment & Plan:

## 2013-12-01 NOTE — Assessment & Plan Note (Signed)
Improved, cont compression stocking, leg elevation

## 2013-12-01 NOTE — Assessment & Plan Note (Signed)
stable overall by history and exam, recent data reviewed with pt, and pt to continue medical treatment as before,  to f/u any worsening symptoms or concerns Lab Results  Component Value Date   HGBA1C 6.8* 11/29/2013

## 2013-12-01 NOTE — Assessment & Plan Note (Signed)
stable overall by history and exam, recent data reviewed with pt, and pt to continue medical treatment as before,  to f/u any worsening symptoms or concerns Lab Results  Component Value Date   LDLCALC 86 08/30/2013

## 2013-12-01 NOTE — Assessment & Plan Note (Signed)
stable overall by history and exam, recent data reviewed with pt, and pt to continue medical treatment as before,  to f/u any worsening symptoms or concerns BP Readings from Last 3 Encounters:  11/29/13 128/66  08/30/13 122/80  08/25/13 148/70

## 2013-12-28 ENCOUNTER — Other Ambulatory Visit: Payer: Self-pay

## 2013-12-28 MED ORDER — LEVOTHYROXINE SODIUM 100 MCG PO TABS: 100.0000 ug | ORAL_TABLET | Freq: Every day | ORAL | Status: DC

## 2014-02-07 ENCOUNTER — Other Ambulatory Visit: Payer: Self-pay | Admitting: Internal Medicine

## 2014-04-03 ENCOUNTER — Ambulatory Visit (INDEPENDENT_AMBULATORY_CARE_PROVIDER_SITE_OTHER): Payer: Medicare (Managed Care) | Admitting: Internal Medicine

## 2014-04-03 ENCOUNTER — Encounter: Payer: Self-pay | Admitting: Internal Medicine

## 2014-04-03 ENCOUNTER — Other Ambulatory Visit (INDEPENDENT_AMBULATORY_CARE_PROVIDER_SITE_OTHER): Payer: Medicare (Managed Care)

## 2014-04-03 VITALS — BP 122/80 | HR 98 | Temp 97.9°F | Wt 179.8 lb

## 2014-04-03 DIAGNOSIS — Z23 Encounter for immunization: Secondary | ICD-10-CM

## 2014-04-03 DIAGNOSIS — E785 Hyperlipidemia, unspecified: Secondary | ICD-10-CM

## 2014-04-03 DIAGNOSIS — Z01818 Encounter for other preprocedural examination: Secondary | ICD-10-CM

## 2014-04-03 DIAGNOSIS — E08329 Diabetes mellitus due to underlying condition with mild nonproliferative diabetic retinopathy without macular edema: Secondary | ICD-10-CM

## 2014-04-03 DIAGNOSIS — E083299 Diabetes mellitus due to underlying condition with mild nonproliferative diabetic retinopathy without macular edema, unspecified eye: Secondary | ICD-10-CM

## 2014-04-03 DIAGNOSIS — J3089 Other allergic rhinitis: Secondary | ICD-10-CM

## 2014-04-03 LAB — URINALYSIS, ROUTINE W REFLEX MICROSCOPIC
Bilirubin Urine: NEGATIVE
HGB URINE DIPSTICK: NEGATIVE
Ketones, ur: NEGATIVE
Nitrite: NEGATIVE
RBC / HPF: NONE SEEN (ref 0–?)
SPECIFIC GRAVITY, URINE: 1.01 (ref 1.000–1.030)
Total Protein, Urine: NEGATIVE
UROBILINOGEN UA: 0.2 (ref 0.0–1.0)
Urine Glucose: NEGATIVE
pH: 6 (ref 5.0–8.0)

## 2014-04-03 LAB — BASIC METABOLIC PANEL
BUN: 25 mg/dL — AB (ref 6–23)
CHLORIDE: 99 meq/L (ref 96–112)
CO2: 23 meq/L (ref 19–32)
CREATININE: 1.2 mg/dL (ref 0.4–1.2)
Calcium: 8.8 mg/dL (ref 8.4–10.5)
GFR: 57.22 mL/min — ABNORMAL LOW (ref >60.00)
GLUCOSE: 94 mg/dL (ref 70–99)
Potassium: 3.8 mEq/L (ref 3.5–5.1)
Sodium: 134 mEq/L — ABNORMAL LOW (ref 135–145)

## 2014-04-03 LAB — CBC WITH DIFFERENTIAL/PLATELET
BASOS PCT: 0.4 % (ref 0.0–3.0)
Basophils Absolute: 0 10*3/uL (ref 0.0–0.1)
EOS ABS: 0.5 10*3/uL (ref 0.0–0.7)
Eosinophils Relative: 6.3 % — ABNORMAL HIGH (ref 0.0–5.0)
HCT: 38.3 % (ref 36.0–46.0)
Hemoglobin: 12.3 g/dL (ref 12.0–15.0)
LYMPHS PCT: 40.3 % (ref 12.0–46.0)
Lymphs Abs: 3.4 10*3/uL (ref 0.7–4.0)
MCHC: 32 g/dL (ref 30.0–36.0)
MCV: 92.3 fl (ref 78.0–100.0)
MONOS PCT: 5.2 % (ref 3.0–12.0)
Monocytes Absolute: 0.4 10*3/uL (ref 0.1–1.0)
Neutro Abs: 4 10*3/uL (ref 1.4–7.7)
Neutrophils Relative %: 47.8 % (ref 43.0–77.0)
Platelets: 303 10*3/uL (ref 150.0–400.0)
RBC: 4.16 Mil/uL (ref 3.87–5.11)
RDW: 13.8 % (ref 11.5–15.5)
WBC: 8.3 10*3/uL (ref 4.0–10.5)

## 2014-04-03 LAB — HEPATIC FUNCTION PANEL
ALBUMIN: 3.4 g/dL — AB (ref 3.5–5.2)
ALT: 9 U/L (ref 0–35)
AST: 18 U/L (ref 0–37)
Alkaline Phosphatase: 45 U/L (ref 39–117)
BILIRUBIN DIRECT: 0.1 mg/dL (ref 0.0–0.3)
TOTAL PROTEIN: 7.3 g/dL (ref 6.0–8.3)
Total Bilirubin: 0.7 mg/dL (ref 0.2–1.2)

## 2014-04-03 LAB — LIPID PANEL
CHOL/HDL RATIO: 3
Cholesterol: 143 mg/dL (ref 0–200)
HDL: 52.3 mg/dL (ref >39.00)
LDL CALC: 79 mg/dL (ref 0–99)
NonHDL: 90.7
TRIGLYCERIDES: 58 mg/dL (ref 0.0–149.0)
VLDL: 11.6 mg/dL (ref 0.0–40.0)

## 2014-04-03 LAB — HEMOGLOBIN A1C: HEMOGLOBIN A1C: 6.7 % — AB (ref 4.6–6.5)

## 2014-04-03 NOTE — Assessment & Plan Note (Signed)
Ok for [employer] allegra prn if zyrtec too sedating, consider flonase and/or allergy referral

## 2014-04-03 NOTE — Assessment & Plan Note (Signed)
stable overall by history and exam, recent data reviewed with pt, and pt to continue medical treatment as before,  to f/u any worsening symptoms or concerns Lab Results  Component Value Date   LDLCALC 86 08/30/2013   For f/u lab

## 2014-04-03 NOTE — Assessment & Plan Note (Signed)
Hx, exam and data reviewed, labs ordered, ECG reviewed as per emr; pt ok for surgury pending labs

## 2014-04-03 NOTE — Progress Notes (Signed)
Subjective:    Patient ID: Michelle Morton, female    DOB: Feb 13, 1932, 80 y.o.   MRN: [id number]  HPI  Here to f/u;  Has bladder surgury planned nov 18 per Dr Meisinhemier/gyn;   Here for yearly f/u as well;  Overall doing ok;  Pt denies CP, worsening SOB, DOE, wheezing, orthopnea, PND, worsening LE edema, palpitations, dizziness or syncope.  Pt denies neurological change such as new headache, facial or extremity weakness.  Pt denies polydipsia, polyuria, or low sugar symptoms. Pt states overall good compliance with treatment and medications, good tolerability, and has been trying to follow lower cholesterol diet.  Pt denies worsening depressive symptoms, suicidal ideation or panic. No fever, night sweats, wt loss, loss of appetite, or other constitutional symptoms.  Pt states good ability with ADL's, has low fall risk, home safety reviewed and adequate, no other significant changes in hearing or vision, and only occasionally active with exercise, states lack of energy most days. 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 Has some mild sedation with zyrtec - Does have several wks ongoing nasal allergy symptoms with clearish congestion, itch and sneezing, without fever, pain, ST, cough, swelling or wheezing. Past Medical History  Diagnosis Date  . Hypertension   . Arthus phenomenon   . Osteoarthritis, knee   . Hyperlipidemia   . Diabetes mellitus, type 2   . Hypothyroidism   . Allergic rhinitis, cause unspecified 02/14/2013   Past Surgical History  Procedure Laterality Date  . Abdominal hysterectomy    . Replacement total knee      left  knee '06  . Knee arthroplasty      left    reports that she quit smoking about 21 years ago. She has never used smokeless tobacco. She reports that she does not drink alcohol or use illicit drugs. family history includes Cancer in her daughter; Diabetes in her father; Hypertension in her father. Allergies  Allergen Reactions  . Penicillins      Current Outpatient Prescriptions on File Prior to Visit  Medication Sig Dispense Refill  . cetirizine (ZYRTEC) 10 MG tablet Take 10 mg by mouth daily.    Marland Kitchen. glucose blood test strip Use as instructed  250.02 100 each 12  . ibuprofen (ADVIL,MOTRIN) 200 MG tablet Take 200 mg by mouth every 6 (six) hours as needed.      . Lancets MISC Use as directed 1 per day 100 each 12  . levothyroxine (SYNTHROID, LEVOTHROID) 100 MCG tablet Take 1 tablet (100 mcg total) by mouth daily. 30 tablet 11  . losartan-hydrochlorothiazide (HYZAAR) 50-12.5 MG per tablet TAKE ONE TABLET BY MOUTH ONCE DAILY 30 tablet 11  . metFORMIN (GLUCOPHAGE) 850 MG tablet Take 1 tablet (850 mg total) by mouth 2 (two) times daily with a meal. 180 tablet 3  . naproxen sodium (ANAPROX) 220 MG tablet Take 220 mg by mouth as needed.      . pravastatin (PRAVACHOL) 40 MG tablet Take 1 tablet (40 mg total) by mouth daily. 30 tablet 8   No current facility-administered medications on file prior to visit.   Review of Systems Constitutional: Negative for increased diaphoresis, other activity, appetite or other siginficant weight change  HENT: Negative for worsening hearing loss, ear pain, facial swelling, mouth sores and neck stiffness.   Eyes: Negative for other worsening pain, redness or visual disturbance.  Respiratory: Negative for shortness of breath and wheezing.   Cardiovascular: Negative for chest pain and palpitations.  Gastrointestinal: Negative  for diarrhea, blood in stool, abdominal distention or other pain Genitourinary: Negative for hematuria, flank pain or change in urine volume.  Musculoskeletal: Negative for myalgias or other joint complaints.  Skin: Negative for color change and wound.  Neurological: Negative for syncope and numbness. other than noted Hematological: Negative for adenopathy. or other swelling Psychiatric/Behavioral: Negative for hallucinations, self-injury, decreased concentration or other worsening  agitation.      Objective:   Physical Exam BP 122/80 mmHg  Pulse 98  Temp(Src) 97.9 F (36.6 C) (Oral)  Wt 179 lb 12 oz (81.534 kg)  SpO2 96% VS noted,  Constitutional: Pt is oriented to person, place, and time. Appears well-developed and well-nourished.  Head: Normocephalic and atraumatic.  Right Ear: External ear normal.  Left Ear: External ear normal.  Nose: Nose normal.  Mouth/Throat: Oropharynx is clear and moist.  Eyes: Conjunctivae and EOM are normal. Pupils are equal, round, and reactive to light.  Neck: Normal range of motion. Neck supple. No JVD present. No tracheal deviation present.  Cardiovascular: Normal rate, regular rhythm, normal heart sounds and intact distal pulses.   Pulmonary/Chest: Effort normal and breath sounds without rales or wheezing  Abdominal: Soft. Bowel sounds are normal. NT. No HSM  Musculoskeletal: Normal range of motion. Exhibits chronic 1-2+ edema bilat   Lymphadenopathy:  Has no cervical adenopathy.  Neurological: Pt is alert and oriented to person, place, and time. Pt has normal reflexes. No cranial nerve deficit. Motor grossly intact Skin: Skin is warm and dry. No rash noted.  Psychiatric:  Has normal mood and affect except mild stressed. Behavior is normal.     Assessment & Plan:

## 2014-04-03 NOTE — Patient Instructions (Addendum)
Your EKG was OK today  You had the flu shot today  You can also take the Allegra OTC for allergies if you have sedation with the Zyrtec  Please continue all other medications as before, and refills have been done if requested.  Please have the pharmacy call with any other refills you may need.  Please continue your efforts at being more active, low cholesterol diet, and weight control.  You are otherwise up to date with prevention measures today.  Please keep your appointments with your specialists as you may have planned  Please go to the LAB in the Basement (turn left off the elevator) for the tests to be done today  You will be contacted by phone if any changes need to be made immediately.  Otherwise, you will receive a letter about your results with an explanation, but please check with MyChart first.  Please remember to sign up for MyChart if you have not done so, as this will be important to you in the future with finding out test results, communicating by private email, and scheduling acute appointments online when needed.  Please return in 6 months, or sooner if needed, with Lab testing done 3-5 days before   [facility]

## 2014-04-03 NOTE — Assessment & Plan Note (Signed)
stable overall by history and exam, recent data reviewed with pt, and pt to continue medical treatment as before,  to f/u any worsening symptoms or concerns Lab Results  Component Value Date   HGBA1C 6.8* 11/29/2013   For f/u lab, cont diet and wt control

## 2014-04-03 NOTE — Progress Notes (Signed)
Pre visit review using our clinic review tool, if applicable. No additional management support is needed unless otherwise documented below in the visit note. 

## 2014-04-04 LAB — MICROALBUMIN / CREATININE URINE RATIO
Creatinine,U: 70.4 mg/dL
Microalb Creat Ratio: 1.7 mg/g (ref 0.0–30.0)
Microalb, Ur: 1.2 mg/dL (ref 0.0–1.9)

## 2014-04-04 LAB — TSH: TSH: 0.25 u[IU]/mL — ABNORMAL LOW (ref 0.35–4.50)

## 2014-04-08 NOTE — Patient Instructions (Addendum)
   Your procedure is scheduled on: Wednesday, Nov 18  [facility] at: 7 AM Pick up the phone at the desk and dial [phone] and inform us of your arrival.  Please call this number if you have any problems the morning of surgery: [phone]  Remember: Do not eat or drink after midnight: Tuesday Take these medicines the morning of surgery with a SIP OF WATER: levothyroxine and losartan-hctz.  Patient instructed to only take Metformin on Tuesday Morning.  Patient instructed to withhold Tuesday night dose and Wednesday morning, day of surgery dose of Metformin.  We will check your sugar upon arrival to Short Stay Dept.  Do not wear jewelry, make-up, or FINGER nail polish No metal in your hair or on your body. Do not wear lotions, powders, perfumes.  You may wear deodorant.  Do not bring valuables to the hospital. Contacts, dentures or bridgework may not be worn into surgery.  Leave suitcase in the car. After Surgery it may be brought to your room. For patients being admitted to the hospital, checkout time is 11:00am the day of discharge.  Home with daughter Prudencio PairCheryl [name] Lukas cell [phone]

## 2014-04-09 ENCOUNTER — Encounter (HOSPITAL_COMMUNITY)
Admission: RE | Admit: 2014-04-09 | Discharge: 2014-04-09 | Disposition: A | Discharge status: Home / Self Care | Payer: Medicare Other | Source: Ambulatory Visit | Attending: Obstetrics and Gynecology | Admitting: Obstetrics and Gynecology

## 2014-04-09 ENCOUNTER — Telehealth: Payer: Self-pay | Admitting: Internal Medicine

## 2014-04-09 ENCOUNTER — Encounter (HOSPITAL_COMMUNITY): Payer: Self-pay

## 2014-04-09 DIAGNOSIS — Z01818 Encounter for other preprocedural examination: Secondary | ICD-10-CM | POA: Diagnosis not present

## 2014-04-09 HISTORY — DX: Asymptomatic varicose veins of unspecified lower extremity: I83.90

## 2014-04-09 HISTORY — DX: Unspecified hearing loss, unspecified ear: H91.90

## 2014-04-09 HISTORY — DX: Personal history of other medical treatment: Z92.89

## 2014-04-09 NOTE — Pre-Procedure Instructions (Signed)
SDS BB History Log given to Lab for patient's history of blood transfusion. 

## 2014-04-09 NOTE — Telephone Encounter (Signed)
Patient states [name] is suppose to write a surgery clearance letter to [facility]  She is scheduled to have surgery 11/18.

## 2014-04-11 ENCOUNTER — Encounter: Payer: Self-pay | Admitting: Internal Medicine

## 2014-04-11 NOTE — Telephone Encounter (Signed)
Done hardcopy to robin - to fax please

## 2014-04-12 NOTE — Telephone Encounter (Signed)
Faxed to [facility] as instructed.

## 2014-04-16 NOTE — H&P (Signed)
Michelle Morton is an 80 y.o. female. She was seen in August as a new patient for a vaginal bulge.  She had a TAH in 1990.  On exam she was found to have a Gr III cystocele with some prolapse of vaginal vault, minimal rectocele.  She was fitted for a ring pessary with support which she tolerated well.  However, she is not happy that the pessary does not fix the problem, and she would like surgical repair.    Pertinent Gynecological History: OB History: G4, P4004 SVD at term x 4   Menstrual History: No LMP recorded. Patient is postmenopausal.    Past Medical History  Diagnosis Date  . Hypertension   . Arthus phenomenon   . Hyperlipidemia   . Diabetes mellitus, type 2   . Hypothyroidism   . Allergic rhinitis, cause unspecified 02/14/2013  . SVD (spontaneous vaginal delivery)     x 4  . Varicose veins     lower legs  . Osteoarthritis, knee     knees - otc med prn  . History of blood transfusion     [facility] - unsure number of units transfused  . Hearing loss     right ear, no hearing aid    Past Surgical History  Procedure Laterality Date  . Abdominal hysterectomy    . Replacement total knee      left  knee '06  . Knee arthroplasty      left  . Right knee replacement    . Tonsillectomy    . Wisdom tooth extraction    . Eye surgery      bilateral cataract eye surgery  . Appendectomy    . Joint replacement      right and left knee  . Colonoscopy    . Kidney stone surgery      removal of stone    Family History  Problem Relation Age of Onset  . Diabetes Father   . Hypertension Father   . Cancer Daughter     breast cancer survior    Social History:  reports that she quit smoking about 21 years ago. Her smoking use included Cigarettes. She has a 16 pack-year smoking history. She has never used smokeless tobacco. She reports that she does not drink alcohol or use illicit drugs.  Allergies:  Allergies  Allergen Reactions  . Penicillins Rash    Allergic to IV  penicillin but tolerates the oral form    No prescriptions prior to admission    Review of Systems  Respiratory: Negative.   Cardiovascular: Negative.   Gastrointestinal: Negative.   Genitourinary: Negative.     There were no vitals taken for this visit. Physical Exam  Constitutional: She appears well-developed and well-nourished.  Neck: Neck supple. No thyromegaly present.  Cardiovascular: Normal rate, regular rhythm and normal heart sounds.   No murmur heard. Respiratory: Effort normal and breath sounds normal.  GI: Soft. She exhibits no distension and no mass. There is no tenderness.  Genitourinary:  Gr III cystocele with some prolapse of vaginal vault as well Absent uterus and cervix No pelvic or adnexal mass    No results found for this or any previous visit (from the past 24 hour(s)).  No results found.  Assessment/Plan: Symptomatic cystocele with some vaginal vault prolapse.  She tolerated a pessary, but would like surgical repair.  Surgical procedure, risks and chances of fixing her problem have all been reviewed.  She was initially scheduled for A&P repair  and colpocleiesis, but she now only wants A&P repair.  Will admit for surgery.  She has medical clearance from Dr. Oliver BarreJames [name] to proceed with surgery.  Rebecca Motta D 04/16/2014, 7:11 PM

## 2014-04-17 ENCOUNTER — Ambulatory Visit (HOSPITAL_COMMUNITY): Payer: Medicare Other | Admitting: Anesthesiology

## 2014-04-17 ENCOUNTER — Encounter (HOSPITAL_COMMUNITY)
Admission: RE | Disposition: A | Discharge status: Home / Self Care | Payer: Self-pay | Source: Ambulatory Visit | Attending: Obstetrics and Gynecology

## 2014-04-17 ENCOUNTER — Encounter (HOSPITAL_COMMUNITY): Payer: Self-pay | Admitting: Anesthesiology

## 2014-04-17 ENCOUNTER — Ambulatory Visit (HOSPITAL_COMMUNITY)
Admission: RE | Admit: 2014-04-17 | Discharge: 2014-04-18 | Disposition: A | Discharge status: Home / Self Care | Payer: Medicare Other | Source: Ambulatory Visit | Attending: Obstetrics and Gynecology | Admitting: Obstetrics and Gynecology

## 2014-04-17 DIAGNOSIS — E039 Hypothyroidism, unspecified: Secondary | ICD-10-CM | POA: Insufficient documentation

## 2014-04-17 DIAGNOSIS — I10 Essential (primary) hypertension: Secondary | ICD-10-CM | POA: Diagnosis not present

## 2014-04-17 DIAGNOSIS — H9190 Unspecified hearing loss, unspecified ear: Secondary | ICD-10-CM | POA: Insufficient documentation

## 2014-04-17 DIAGNOSIS — M179 Osteoarthritis of knee, unspecified: Secondary | ICD-10-CM | POA: Diagnosis not present

## 2014-04-17 DIAGNOSIS — Z880 Allergy status to penicillin: Secondary | ICD-10-CM | POA: Diagnosis not present

## 2014-04-17 DIAGNOSIS — I8390 Asymptomatic varicose veins of unspecified lower extremity: Secondary | ICD-10-CM | POA: Insufficient documentation

## 2014-04-17 DIAGNOSIS — Z87891 Personal history of nicotine dependence: Secondary | ICD-10-CM | POA: Diagnosis not present

## 2014-04-17 DIAGNOSIS — E785 Hyperlipidemia, unspecified: Secondary | ICD-10-CM | POA: Insufficient documentation

## 2014-04-17 DIAGNOSIS — N8189 Other female genital prolapse: Secondary | ICD-10-CM | POA: Diagnosis present

## 2014-04-17 DIAGNOSIS — N814 Uterovaginal prolapse, unspecified: Secondary | ICD-10-CM | POA: Diagnosis not present

## 2014-04-17 DIAGNOSIS — E119 Type 2 diabetes mellitus without complications: Secondary | ICD-10-CM | POA: Insufficient documentation

## 2014-04-17 DIAGNOSIS — N8110 Cystocele, unspecified: Secondary | ICD-10-CM | POA: Diagnosis present

## 2014-04-17 HISTORY — PX: ANTERIOR AND POSTERIOR REPAIR: SHX5121

## 2014-04-17 LAB — GLUCOSE, CAPILLARY
GLUCOSE-CAPILLARY: 110 mg/dL — AB (ref 70–99)
GLUCOSE-CAPILLARY: 147 mg/dL — AB (ref 70–99)
Glucose-Capillary: 127 mg/dL — ABNORMAL HIGH (ref 70–99)
Glucose-Capillary: 112 mg/dL — ABNORMAL HIGH (ref 70–99)
Glucose-Capillary: 122 mg/dL — ABNORMAL HIGH (ref 70–99)

## 2014-04-17 SURGERY — ANTERIOR (CYSTOCELE) AND POSTERIOR REPAIR (RECTOCELE)
Anesthesia: General

## 2014-04-17 MED ORDER — ONDANSETRON HCL 4 MG/2ML IJ SOLN: 4.0000 mg | Freq: Once | INTRAMUSCULAR | Status: DC | PRN

## 2014-04-17 MED ORDER — ALUM & MAG HYDROXIDE-SIMETH 200-200-20 MG/5ML PO SUSP: 30.0000 mL | ORAL | Status: DC | PRN

## 2014-04-17 MED ORDER — LACTATED RINGERS IV SOLN
INTRAVENOUS | Status: DC
  Administered 2014-04-17 (×3): via INTRAVENOUS

## 2014-04-17 MED ORDER — LABETALOL HCL 5 MG/ML IV SOLN
INTRAVENOUS | Status: AC
  Filled 2014-04-17: qty 4

## 2014-04-17 MED ORDER — ONDANSETRON HCL 4 MG PO TABS: 4.0000 mg | ORAL_TABLET | Freq: Four times a day (QID) | ORAL | Status: DC | PRN

## 2014-04-17 MED ORDER — PRAVASTATIN SODIUM 40 MG PO TABS
40.0000 mg | ORAL_TABLET | Freq: Every day | ORAL | Status: DC
  Filled 2014-04-17 (×2): qty 1

## 2014-04-17 MED ORDER — METFORMIN HCL 850 MG PO TABS
850.0000 mg | ORAL_TABLET | Freq: Two times a day (BID) | ORAL | Status: DC
  Administered 2014-04-17 – 2014-04-18 (×2): 850 mg via ORAL
  Filled 2014-04-17 (×2): qty 1

## 2014-04-17 MED ORDER — SIMETHICONE 80 MG PO CHEW: 80.0000 mg | CHEWABLE_TABLET | Freq: Four times a day (QID) | ORAL | Status: DC | PRN

## 2014-04-17 MED ORDER — KETOROLAC TROMETHAMINE 30 MG/ML IJ SOLN: 15.0000 mg | Freq: Four times a day (QID) | INTRAMUSCULAR | Status: DC

## 2014-04-17 MED ORDER — LOSARTAN POTASSIUM 50 MG PO TABS
50.0000 mg | ORAL_TABLET | Freq: Every day | ORAL | Status: DC
  Filled 2014-04-17: qty 1

## 2014-04-17 MED ORDER — FENTANYL CITRATE 0.05 MG/ML IJ SOLN
INTRAMUSCULAR | Status: AC
  Filled 2014-04-17: qty 2

## 2014-04-17 MED ORDER — KETOROLAC TROMETHAMINE 30 MG/ML IJ SOLN
15.0000 mg | Freq: Four times a day (QID) | INTRAMUSCULAR | Status: DC
  Administered 2014-04-17: 15 mg via INTRAVENOUS
  Filled 2014-04-17 (×2): qty 1

## 2014-04-17 MED ORDER — CEFAZOLIN SODIUM-DEXTROSE 2-3 GM-% IV SOLR
INTRAVENOUS | Status: AC
  Filled 2014-04-17: qty 50

## 2014-04-17 MED ORDER — ESTRADIOL 0.1 MG/GM VA CREA
TOPICAL_CREAM | VAGINAL | Status: AC
  Filled 2014-04-17: qty 42.5

## 2014-04-17 MED ORDER — KETOROLAC TROMETHAMINE 30 MG/ML IJ SOLN: 30.0000 mg | Freq: Four times a day (QID) | INTRAMUSCULAR | Status: DC

## 2014-04-17 MED ORDER — LOSARTAN POTASSIUM-HCTZ 50-12.5 MG PO TABS: 1.0000 | ORAL_TABLET | Freq: Every day | ORAL | Status: DC

## 2014-04-17 MED ORDER — DIPHENHYDRAMINE HCL 50 MG/ML IJ SOLN
INTRAMUSCULAR | Status: AC
  Filled 2014-04-17: qty 1

## 2014-04-17 MED ORDER — ONDANSETRON HCL 4 MG/2ML IJ SOLN
INTRAMUSCULAR | Status: DC | PRN
  Administered 2014-04-17: 4 mg via INTRAVENOUS

## 2014-04-17 MED ORDER — LIDOCAINE HCL (CARDIAC) 20 MG/ML IV SOLN
INTRAVENOUS | Status: DC | PRN
  Administered 2014-04-17: 30 mg via INTRAVENOUS

## 2014-04-17 MED ORDER — MEPERIDINE HCL 25 MG/ML IJ SOLN: 6.2500 mg | INTRAMUSCULAR | Status: DC | PRN

## 2014-04-17 MED ORDER — KETOROLAC TROMETHAMINE 30 MG/ML IJ SOLN: 30.0000 mg | Freq: Once | INTRAMUSCULAR | Status: DC

## 2014-04-17 MED ORDER — MENTHOL 3 MG MT LOZG: 1.0000 | LOZENGE | OROMUCOSAL | Status: DC | PRN

## 2014-04-17 MED ORDER — LABETALOL HCL 5 MG/ML IV SOLN
INTRAVENOUS | Status: DC | PRN
  Administered 2014-04-17: 5 mg via INTRAVENOUS

## 2014-04-17 MED ORDER — FENTANYL CITRATE 0.05 MG/ML IJ SOLN
INTRAMUSCULAR | Status: DC | PRN
  Administered 2014-04-17 (×4): 25 ug via INTRAVENOUS

## 2014-04-17 MED ORDER — LEVOTHYROXINE SODIUM 100 MCG PO TABS
100.0000 ug | ORAL_TABLET | Freq: Every day | ORAL | Status: DC
  Administered 2014-04-18: 100 ug via ORAL
  Filled 2014-04-17: qty 1

## 2014-04-17 MED ORDER — HYDROCHLOROTHIAZIDE 12.5 MG PO CAPS
12.5000 mg | ORAL_CAPSULE | Freq: Every day | ORAL | Status: DC
  Filled 2014-04-17: qty 1

## 2014-04-17 MED ORDER — DEXTROSE-NACL 5-0.45 % IV SOLN
INTRAVENOUS | Status: DC
  Administered 2014-04-17 – 2014-04-18 (×2): via INTRAVENOUS

## 2014-04-17 MED ORDER — PROPOFOL 10 MG/ML IV EMUL
INTRAVENOUS | Status: AC
  Filled 2014-04-17: qty 20

## 2014-04-17 MED ORDER — CEFAZOLIN SODIUM-DEXTROSE 2-3 GM-% IV SOLR: 2.0000 g | INTRAVENOUS | Status: DC

## 2014-04-17 MED ORDER — FENTANYL CITRATE 0.05 MG/ML IJ SOLN
25.0000 ug | INTRAMUSCULAR | Status: DC | PRN
  Administered 2014-04-17 (×2): 25 ug via INTRAVENOUS

## 2014-04-17 MED ORDER — IBUPROFEN 600 MG PO TABS
600.0000 mg | ORAL_TABLET | Freq: Four times a day (QID) | ORAL | Status: DC | PRN
  Administered 2014-04-17 – 2014-04-18 (×2): 600 mg via ORAL
  Filled 2014-04-17 (×2): qty 1

## 2014-04-17 MED ORDER — INSULIN ASPART 100 UNIT/ML ~~LOC~~ SOLN
0.0000 [IU] | SUBCUTANEOUS | Status: DC
  Administered 2014-04-17 – 2014-04-18 (×2): 2 [IU] via SUBCUTANEOUS

## 2014-04-17 MED ORDER — ONDANSETRON HCL 4 MG/2ML IJ SOLN: 4.0000 mg | Freq: Four times a day (QID) | INTRAMUSCULAR | Status: DC | PRN

## 2014-04-17 MED ORDER — LORATADINE 10 MG PO TABS
10.0000 mg | ORAL_TABLET | Freq: Every day | ORAL | Status: DC
  Filled 2014-04-17 (×2): qty 1

## 2014-04-17 MED ORDER — ONDANSETRON HCL 4 MG/2ML IJ SOLN
INTRAMUSCULAR | Status: AC
  Filled 2014-04-17: qty 2

## 2014-04-17 MED ORDER — LIDOCAINE HCL (CARDIAC) 20 MG/ML IV SOLN
INTRAVENOUS | Status: AC
  Filled 2014-04-17: qty 5

## 2014-04-17 MED ORDER — ESTRADIOL 0.1 MG/GM VA CREA
TOPICAL_CREAM | VAGINAL | Status: DC | PRN
  Administered 2014-04-17: 1 via VAGINAL

## 2014-04-17 MED ORDER — OXYCODONE-ACETAMINOPHEN 5-325 MG PO TABS: 1.0000 | ORAL_TABLET | ORAL | Status: DC | PRN

## 2014-04-17 SURGICAL SUPPLY — 31 items
BLADE SURG 15 STRL LF C SS BP (BLADE) ×1 IMPLANT
BLADE SURG 15 STRL SS (BLADE) ×1
CLOTH BEACON ORANGE TIMEOUT ST (SAFETY) ×2 IMPLANT
CONT PATH 16OZ SNAP LID 3702 (MISCELLANEOUS) IMPLANT
DECANTER SPIKE VIAL GLASS SM (MISCELLANEOUS) IMPLANT
DRAPE HYSTEROSCOPY (DRAPE) ×2 IMPLANT
GAUZE PACKING 2X5 YD STRL (GAUZE/BANDAGES/DRESSINGS) ×2 IMPLANT
GLOVE BIO SURGEON STRL SZ7 (GLOVE) ×4 IMPLANT
GLOVE BIO SURGEON STRL SZ8 (GLOVE) ×2 IMPLANT
GLOVE BIOGEL M 6.5 STRL (GLOVE) ×2 IMPLANT
GLOVE BIOGEL PI IND STRL 6.5 (GLOVE) ×1 IMPLANT
GLOVE BIOGEL PI IND STRL 7.0 (GLOVE) ×3 IMPLANT
GLOVE BIOGEL PI INDICATOR 6.5 (GLOVE) ×1
GLOVE BIOGEL PI INDICATOR 7.0 (GLOVE) ×3
GLOVE ORTHO TXT STRL SZ7.5 (GLOVE) ×2 IMPLANT
GOWN STRL REUS W/TWL LRG LVL3 (GOWN DISPOSABLE) ×8 IMPLANT
NEEDLE HYPO 22GX1.5 SAFETY (NEEDLE) IMPLANT
NEEDLE MAYO .5 CIRCLE (NEEDLE) IMPLANT
NS IRRIG 1000ML POUR BTL (IV SOLUTION) ×2 IMPLANT
PACK VAGINAL WOMENS (CUSTOM PROCEDURE TRAY) ×2 IMPLANT
SUT PROLENE 1 CTX 30  8455H (SUTURE)
SUT PROLENE 1 CTX 30 8455H (SUTURE) IMPLANT
SUT SILK 0 SH 30 (SUTURE) ×2 IMPLANT
SUT VIC AB 2-0 CT2 27 (SUTURE) ×10 IMPLANT
SUT VIC AB 3-0 CT1 27 (SUTURE)
SUT VIC AB 3-0 CT1 TAPERPNT 27 (SUTURE) IMPLANT
SUT VIC AB 3-0 SH 27 (SUTURE)
SUT VIC AB 3-0 SH 27X BRD (SUTURE) IMPLANT
SUT VICRYL 0 UR6 27IN ABS (SUTURE) IMPLANT
TOWEL OR 17X24 6PK STRL BLUE (TOWEL DISPOSABLE) ×4 IMPLANT
TRAY FOLEY CATH 14FR (SET/KITS/TRAYS/PACK) ×2 IMPLANT

## 2014-04-17 NOTE — Anesthesia Preprocedure Evaluation (Signed)
Anesthesia Evaluation  Patient identified by MRN, date of birth, ID band Patient awake    Reviewed: Allergy & Precautions, H&P , Patient's Chart, lab work & pertinent test results, reviewed documented beta blocker date and time   Airway Mallampati: II  TM Distance: >3 FB Neck ROM: full    Dental no notable dental hx.    Pulmonary former smoker,  breath sounds clear to auscultation  Pulmonary exam normal       Cardiovascular hypertension, On Medications Rhythm:regular Rate:Normal     Neuro/Psych    GI/Hepatic   Endo/Other  diabetes, Type 2  Renal/GU      Musculoskeletal   Abdominal   Peds  Hematology   Anesthesia Other Findings EKG okay, pt denies sx of CAD  recently well.  Offered Regional- pt refuses Am BS - 110  Reproductive/Obstetrics                             Anesthesia Physical Anesthesia Plan  ASA: II  Anesthesia Plan: General   Post-op Pain Management:    Induction: Intravenous  Airway Management Planned: Oral ETT and LMA  Additional Equipment:   Intra-op Plan:   Post-operative Plan: Extubation in OR  Informed Consent: I have reviewed the patients History and Physical, chart, labs and discussed the procedure including the risks, benefits and alternatives for the proposed anesthesia with the patient or authorized representative who has indicated his/her understanding and acceptance.   Dental Advisory Given and Dental advisory given  Plan Discussed with: CRNA and Surgeon  Anesthesia Plan Comments: (  Discussed general anesthesia, including possible nausea, instrumentation of airway, sore throat,pulmonary aspiration, etc. I asked if the were any outstanding questions, or  concerns before we proceeded. )        Anesthesia Quick Evaluation

## 2014-04-17 NOTE — Anesthesia Postprocedure Evaluation (Signed)
  Anesthesia Post-op Note  Patient: Michelle Morton  Procedure(s) Performed: Procedure(s): ANTERIOR (CYSTOCELE) AND POSTERIOR REPAIR (RECTOCELE) (N/A)  Patient Location: PACU and Women's Unit  Anesthesia Type:General  Level of Consciousness: awake, alert  and oriented  Airway and Oxygen Therapy: Patient Spontanous Breathing  Post-op Pain: mild  Post-op Assessment: Post-op Vital signs reviewed, Patient's Cardiovascular Status Stable, Respiratory Function Stable, Patent Airway, No signs of Nausea or vomiting, Adequate PO intake and Pain level controlled  Post-op Vital Signs: Reviewed and stable  Last Vitals:  Filed Vitals:   04/17/14 1156  BP: 142/61  Pulse: 78  Temp: 36.4 C  Resp: 12    Complications: No apparent anesthesia complications

## 2014-04-17 NOTE — Transfer of Care (Signed)
Immediate Anesthesia Transfer of Care Note  Patient: Michelle Morton  Procedure(s) Performed: Procedure(s): ANTERIOR (CYSTOCELE) AND POSTERIOR REPAIR (RECTOCELE) (N/A)  Patient Location: PACU  Anesthesia Type:General  Level of Consciousness: awake, sedated and patient cooperative  Airway & Oxygen Therapy: Patient Spontanous Breathing and Patient connected to nasal cannula oxygen  Post-op Assessment: Report given to PACU RN and Post -op Vital signs reviewed and stable  Post vital signs: Reviewed and stable  Complications: No apparent anesthesia complications

## 2014-04-17 NOTE — Op Note (Signed)
Preoperative diagnosis: Symptomatic cystocele, small rectocele, some vaginal vault prolapse Postoperative diagnosis:  Same Procedure: Anterior and posterior repair Surgeon: Lavina Hammanodd Paullette Mckain M.D. Asst.: Sherron MondayJody Bovard, MD Anesthesia: Gen with LMA Findings: She had a large cystocele and minimal rectocele.  estimated blood loss: 50 cc Complications: None Specimens: None  Procedure in detail:  The patient was taken to the operating room and placed in the sitting position. General anesthesia was induced, and she was then placed in mobile stirrups. Perineum and vagina were then prepped and draped in usual sterile fashion and bladder drained with a Robinson catheter. A weighted speculum was placed in the vagina. The vaginal cuff was grasped with 2 Allis clamps and a small horizontal piece of vaginal tissue was removed. The cystocele was then mobilized by dissecting anteriorly in the midline from the vaginal cuff to within about 2 cm of the urethral meatus in the midline. Cystocele was mobilized laterally bluntly and sharply. Cystocele was then reduced with several interrupted sutures of 2-0 Vicryl with adequate reduction. A large amount of excess vaginal tissue was removed and the incision was closed with running locking 2-0 Vicryl with adequate closure and adequate hemostasis.  Attention was now turned to the rectocele repair. The hymenal ring was grasped with 2 Allis clamps at a distance that when brought together would still allow two fingers to  easily pass into the vagina. A horizontal piece of tissue was removed. The posterior vaginal mucosa was then dissected in the midline from the hymenal ring to the vaginal apex. Rectocele was then mobilized bilaterally sharply and bluntly. The rectocele was then reduced with interrupted sutures of 2-0 Vicryl. This achieved good reduction. Excess vaginal mucosa was then removed sharply. Vagina was closed from the vaginal apex to the hymenal ring with running locking 2-0  Vicryl.  This achieved adequate closure and adequate hemostasis.  A rectal exam confirmed good reduction of the rectocele.  The vagina was then packed with 2 inch gauze coated with Estrace cream. A Foley catheter was also placed. The patient tolerated the procedure well. She was awakened and taken to the recovery in stable condition. Counts were correct, she had PAS hose on throughout the procedure. She received Ancef 2 g for surgical prophylaxis.

## 2014-04-17 NOTE — Addendum Note (Signed)
Addendum  created 04/17/14 1345 by Elbert Ewingsolleen S Abdulrahim Siddiqi, CRNA   Modules edited: Notes Section   Notes Section:  File: [id number]

## 2014-04-17 NOTE — Anesthesia Postprocedure Evaluation (Signed)
Anesthesia Post Note  Patient: Michelle KnudsenMamie W Pate  Procedure(s) Performed: Procedure(s) (LRB): ANTERIOR (CYSTOCELE) AND POSTERIOR REPAIR (RECTOCELE) (N/A)  Anesthesia type: General  Patient location: PACU  Post pain: Pain level controlled  Post assessment: Post-op Vital signs reviewed  Last Vitals:  Filed Vitals:   04/17/14 1015  BP:   Pulse:   Temp:   Resp: 8    Post vital signs: Reviewed  Level of consciousness: sedated  Complications: No apparent anesthesia complications

## 2014-04-17 NOTE — Progress Notes (Signed)
Day of Surgery Procedure(s) (LRB): ANTERIOR (CYSTOCELE) AND POSTERIOR REPAIR (RECTOCELE) (N/A)  Subjective: Pt looks fabulous.  Up in chair reading paper most of afternoon and ambulating well.  Good UOP with foley and vaginal packing in place. Tolerating po fine.  Only pain is her typical knee pain that is chronic  Objective: I have reviewed patient's vital signs and intake and output.  BS all WNL  General: alert and cooperative  Assessment: s/p Procedure(s): ANTERIOR (CYSTOCELE) AND POSTERIOR REPAIR (RECTOCELE) (N/A): progressing well and tolerating diet   Plan: Per RN Dr. Jackelyn KnifeMeisinger plans to d/c packing and foley in AM, then probable d/c home. Doing very well.  LOS: 0 days    Jahi Roza W 04/17/2014, 6:27 PM

## 2014-04-17 NOTE — Interval H&P Note (Signed)
History and Physical Interval Note:  04/17/2014 8:09 AM  Michelle Morton  has presented today for surgery, with the diagnosis of Cystocele and Vaginal Vault Prolapse, [id number]  The various methods of treatment have been discussed with the patient and family. After consideration of risks, benefits and other options for treatment, the patient has consented to  Procedure(s) with comments: ANTERIOR (CYSTOCELE) AND POSTERIOR REPAIR (RECTOCELE) with COLPOLEIESIS (N/A) - 1 1/2hrs OR time  as a surgical intervention .  The patient's history has been reviewed, patient examined, no change in status, stable for surgery.  I have reviewed the patient's chart and labs.  Questions were answered to the patient's satisfaction.    She does not want colpocleiesis.   Alahni Varone D

## 2014-04-18 ENCOUNTER — Encounter (HOSPITAL_COMMUNITY): Payer: Self-pay | Admitting: Obstetrics and Gynecology

## 2014-04-18 DIAGNOSIS — N814 Uterovaginal prolapse, unspecified: Secondary | ICD-10-CM | POA: Diagnosis not present

## 2014-04-18 LAB — CBC
HEMATOCRIT: 32.2 % — AB (ref 36.0–46.0)
Hemoglobin: 10.5 g/dL — ABNORMAL LOW (ref 12.0–15.0)
MCH: 30 pg (ref 26.0–34.0)
MCHC: 32.6 g/dL (ref 30.0–36.0)
MCV: 92 fL (ref 78.0–100.0)
Platelets: 282 10*3/uL (ref 150–400)
RBC: 3.5 MIL/uL — AB (ref 3.87–5.11)
RDW: 13.2 % (ref 11.5–15.5)
WBC: 8.4 10*3/uL (ref 4.0–10.5)

## 2014-04-18 LAB — GLUCOSE, CAPILLARY
GLUCOSE-CAPILLARY: 113 mg/dL — AB (ref 70–99)
GLUCOSE-CAPILLARY: 73 mg/dL (ref 70–99)
Glucose-Capillary: 130 mg/dL — ABNORMAL HIGH (ref 70–99)

## 2014-04-18 MED ORDER — IBUPROFEN 600 MG PO TABS: 600.0000 mg | ORAL_TABLET | Freq: Four times a day (QID) | ORAL | Status: DC | PRN

## 2014-04-18 NOTE — Discharge Instructions (Signed)
Call for heavy bleeding, increasing pain, difficulty voiding, or temp >100

## 2014-04-18 NOTE — Progress Notes (Signed)
1 Day Post-Op Procedure(s) (LRB): ANTERIOR (CYSTOCELE) AND POSTERIOR REPAIR (RECTOCELE) (N/A)  Subjective: Patient reports tolerating PO.  Some pain in left knee, not much pain from surgery.  Objective: I have reviewed patient's vital signs, intake and output and labs.  General: alert Vaginal Bleeding: minimal, foley and vaginal packing removed  Assessment: s/p Procedure(s): ANTERIOR (CYSTOCELE) AND POSTERIOR REPAIR (RECTOCELE) (N/A): stable and progressing well  Plan: Advance diet Encourage ambulation Discontinue IV fluids Discharge home  LOS: 1 day    Lejla Moeser D 04/18/2014, 5:34 AM

## 2014-04-18 NOTE — Plan of Care (Signed)
Problem: Phase I Progression Outcomes Goal: IS, TCDB as ordered Outcome: Completed/Met Date Met:  04/18/14

## 2014-04-18 NOTE — Progress Notes (Signed)
Pt verbalizes understanding of d/c instructions, medications, follow up appt, and when to seek medical care. Instructions reviewed with patient and her daughter. Reviewed belongings policy and encouraged pt to check room thoroughly. No questions at this time. IV d/c without complications. Pt d/c via wheelchair accompanied by NT to main entrance, pts daughter is driving her home. [name], Michelle Morton

## 2014-04-18 NOTE — Plan of Care (Signed)
Problem: Phase I Progression Outcomes Goal: Pain controlled with appropriate interventions Outcome: Completed/Met Date Met:  04/18/14 Goal: Admission history reviewed Outcome: Completed/Met Date Met:  04/18/14 Goal: Dangle/OOB as tolerated per MD order Outcome: Completed/Met Date Met:  04/18/14 Goal: VS, stable, temp < 100.4 degrees F Outcome: Completed/Met Date Met:  04/18/14 Goal: I & O every 4 hrs or as ordered Outcome: Completed/Met Date Met:  04/18/14 Goal: Other Phase I Outcomes/Goals Outcome: Not Applicable Date Met:  78/47/84

## 2014-04-22 NOTE — Discharge Summary (Signed)
Physician Discharge Summary  Patient ID: Michelle KnudsenMamie W Abplanalp MRN: [id number] DOB/AGE: [birth year] 78 y.o.  Admit date: 04/17/2014 Discharge date: 04/18/2014  Admission Diagnoses:  Symptomatic cystocele and rectocele  Discharge Diagnoses: Same  Active Problems:   Pelvic relaxation   Discharged Condition: good  Hospital Course: Admitted for A&P repair with general anesthesia.  Surgery went well, no post-op complications.     Discharge Exam: Blood pressure 140/72, pulse 79, temperature 97.8 F (36.6 C), temperature source Oral, resp. rate 18, SpO2 100 %. General appearance: alert  Disposition: 01-Home or Self Care  Discharge Instructions    Diet - low sodium heart healthy    Complete by:  As directed      Increase activity slowly    Complete by:  As directed      Lifting restrictions    Complete by:  As directed   10 lbs            Medication List    TAKE these medications        BIOTIN PO  Take 1 tablet by mouth daily.     cetirizine 10 MG tablet  Commonly known as:  ZYRTEC  Take 10 mg by mouth daily.     glucose blood test strip  Use as instructed  250.02     ibuprofen 200 MG tablet  Commonly known as:  ADVIL,MOTRIN  Take 200 mg by mouth every 6 (six) hours as needed for moderate pain.     ibuprofen 600 MG tablet  Commonly known as:  ADVIL,MOTRIN  Take 1 tablet (600 mg total) by mouth every 6 (six) hours as needed (mild pain).     Lancets Misc  Use as directed 1 per day     levothyroxine 100 MCG tablet  Commonly known as:  SYNTHROID, LEVOTHROID  Take 1 tablet (100 mcg total) by mouth daily.     losartan-hydrochlorothiazide 50-12.5 MG per tablet  Commonly known as:  HYZAAR  Take 1 tablet by mouth daily.     losartan-hydrochlorothiazide 50-12.5 MG per tablet  Commonly known as:  HYZAAR  TAKE ONE TABLET BY MOUTH ONCE DAILY     metFORMIN 850 MG tablet  Commonly known as:  GLUCOPHAGE  Take 1 tablet (850 mg total) by mouth 2 (two) times daily with a meal.      naproxen sodium 220 MG tablet  Commonly known as:  ANAPROX  Take 220 mg by mouth daily as needed (pain).     pravastatin 40 MG tablet  Commonly known as:  PRAVACHOL  Take 1 tablet (40 mg total) by mouth daily.     vitamin C 500 MG tablet  Commonly known as:  ASCORBIC ACID  Take 500 mg by mouth daily.           Follow-up Information    Follow up with Shatana Saxton D, MD. Schedule an appointment as soon as possible for a visit in 2 weeks.   Specialty:  Obstetrics and Gynecology   Contact information:   [address], [address] [place] [address] [phone]       Signed: Zenaida NieceMEISINGER,Kinney Sackmann D 04/22/2014, 8:34 AM

## 2014-05-01 ENCOUNTER — Telehealth: Payer: Self-pay | Admitting: Internal Medicine

## 2014-05-01 MED ORDER — METFORMIN HCL 850 MG PO TABS: 850.0000 mg | ORAL_TABLET | Freq: Two times a day (BID) | ORAL | Status: DC

## 2014-05-01 NOTE — Telephone Encounter (Signed)
Notified pt sent refill to [facility].../lmb 

## 2014-05-01 NOTE — Telephone Encounter (Signed)
Pt called in to get a refill on her metFORMIN (GLUCOPHAGE) 850 MG tablet [[id number]]

## 2014-05-02 LAB — HM DIABETES EYE EXAM

## 2014-05-16 ENCOUNTER — Encounter: Payer: Self-pay | Admitting: Internal Medicine

## 2014-05-30 ENCOUNTER — Encounter: Payer: Self-pay | Admitting: Internal Medicine

## 2014-05-30 ENCOUNTER — Ambulatory Visit (INDEPENDENT_AMBULATORY_CARE_PROVIDER_SITE_OTHER): Payer: Medicare Other | Admitting: Internal Medicine

## 2014-05-30 VITALS — BP 144/82 | HR 98 | Temp 97.9°F | Ht 66.0 in | Wt 174.8 lb

## 2014-05-30 DIAGNOSIS — E039 Hypothyroidism, unspecified: Secondary | ICD-10-CM

## 2014-05-30 DIAGNOSIS — E083299 Diabetes mellitus due to underlying condition with mild nonproliferative diabetic retinopathy without macular edema, unspecified eye: Secondary | ICD-10-CM

## 2014-05-30 DIAGNOSIS — J3089 Other allergic rhinitis: Secondary | ICD-10-CM

## 2014-05-30 DIAGNOSIS — Z0000 Encounter for general adult medical examination without abnormal findings: Secondary | ICD-10-CM

## 2014-05-30 DIAGNOSIS — E08329 Diabetes mellitus due to underlying condition with mild nonproliferative diabetic retinopathy without macular edema: Secondary | ICD-10-CM

## 2014-05-30 MED ORDER — LEVOTHYROXINE SODIUM 88 MCG PO TABS: 88.0000 ug | ORAL_TABLET | Freq: Every day | ORAL | Status: DC

## 2014-05-30 MED ORDER — FLUTICASONE PROPIONATE 50 MCG/ACT NA SUSP: 2.0000 | Freq: Every day | NASAL | Status: DC

## 2014-05-30 NOTE — Assessment & Plan Note (Signed)

## 2014-05-30 NOTE — Assessment & Plan Note (Signed)
Mild overcontrolled, to reduce the levothyroxine from 100 to , f/u lab next visit

## 2014-05-30 NOTE — Progress Notes (Signed)
Pre visit review using our clinic review tool, if applicable. No additional management support is needed unless otherwise documented below in the visit note. 

## 2014-05-30 NOTE — Progress Notes (Signed)
Subjective:    Patient ID: Michelle Morton, female    DOB: 10/07/1931, 80 y.o.   MRN: [id number]  HPI  Here for wellness and f/u;  Overall doing ok;  Pt denies CP, worsening SOB, DOE, wheezing, orthopnea, PND, worsening LE edema, palpitations, dizziness or syncope.  Pt denies neurological change such as new headache, facial or extremity weakness.  Pt denies polydipsia, polyuria, or low sugar symptoms. Pt states overall good compliance with treatment and medications, good tolerability, and has been trying to follow lower cholesterol diet.  Pt denies worsening depressive symptoms, suicidal ideation or panic. No fever, night sweats, wt loss, loss of appetite, or other constitutional symptoms.  Pt states good ability with ADL's, has low fall risk, home safety reviewed and adequate, no other significant changes in hearing or vision, and only occasionally active with exercise. Lost 5 lbs post op recently. Wt Readings from Last 3 Encounters:  05/30/14 174 lb 12 oz (79.266 kg)  04/09/14 179 lb (81.194 kg)  04/03/14 179 lb 12 oz (81.534 kg)  S/p bladder surgury per GYN/Dr meisinger per pt.  Does have several wks ongoing nasal allergy symptoms with clearish congestion, itch and sneezing, without fever, pain, ST, cough, swelling or wheezing, despite zyrtec Past Medical History  Diagnosis Date  . Hypertension   . Arthus phenomenon   . Hyperlipidemia   . Diabetes mellitus, type 2   . Hypothyroidism   . Allergic rhinitis, cause unspecified 02/14/2013  . SVD (spontaneous vaginal delivery)     x 4  . Varicose veins     lower legs  . Osteoarthritis, knee     knees - otc med prn  . History of blood transfusion     [facility] - unsure number of units transfused  . Hearing loss     right ear, no hearing aid   Past Surgical History  Procedure Laterality Date  . Abdominal hysterectomy    . Replacement total knee      left  knee '06  . Knee arthroplasty      left  . Right knee replacement    .  Tonsillectomy    . Wisdom tooth extraction    . Eye surgery      bilateral cataract eye surgery  . Appendectomy    . Joint replacement      right and left knee  . Colonoscopy    . Kidney stone surgery      removal of stone  . Anterior and posterior repair N/A 04/17/2014    Procedure: ANTERIOR (CYSTOCELE) AND POSTERIOR REPAIR (RECTOCELE);  Surgeon: Lavina Hammanodd Meisinger, MD;  Location: [facility];  Service: Gynecology;  Laterality: N/A;    reports that she quit smoking about 22 years ago. Her smoking use included Cigarettes. She has a 16 pack-year smoking history. She has never used smokeless tobacco. She reports that she does not drink alcohol or use illicit drugs. family history includes Cancer in her daughter; Diabetes in her father; Hypertension in her father. Allergies  Allergen Reactions  . Penicillins Rash    Allergic to IV penicillin but tolerates the oral form    Review of Systems Constitutional: Negative for increased diaphoresis, other activity, appetite or other siginficant weight change  HENT: Negative for worsening hearing loss, ear pain, facial swelling, mouth sores and neck stiffness.   Eyes: Negative for other worsening pain, redness or visual disturbance.  Respiratory: Negative for shortness of breath and wheezing.   Cardiovascular: Negative for chest pain and palpitations.  Gastrointestinal: Negative for diarrhea, blood in stool, abdominal distention or other pain Genitourinary: Negative for hematuria, flank pain or change in urine volume.  Musculoskeletal: Negative for myalgias or other joint complaints.  Skin: Negative for color change and wound.  Neurological: Negative for syncope and numbness. other than noted Hematological: Negative for adenopathy. or other swelling Psychiatric/Behavioral: Negative for hallucinations, self-injury, decreased concentration or other worsening agitation.      Objective:   Physical Exam BP 144/82 mmHg  Pulse 98  Temp(Src) 97.9 F (36.6  C) (Oral)  Ht 5\' 6"  (1.676 m)  Wt 174 lb 12 oz (79.266 kg)  BMI 28.22 kg/m2  SpO2 96% VS noted,  Constitutional: Pt is oriented to person, place, and time. Appears well-developed and well-nourished.  Head: Normocephalic and atraumatic.  Right Ear: External ear normal.  Left Ear: External ear normal.  Nose: Nose normal.  Mouth/Throat: Oropharynx is clear and moist.  Eyes: Conjunctivae and EOM are normal. Pupils are equal, round, and reactive to light.  Neck: Normal range of motion. Neck supple. No JVD present. No tracheal deviation present.  Cardiovascular: Normal rate, regular rhythm, normal heart sounds and intact distal pulses.   Pulmonary/Chest: Effort normal and breath sounds without rales or wheezing  Abdominal: Soft. Bowel sounds are normal. NT. No HSM  Musculoskeletal: Normal range of motion. Exhibits no edema.  Lymphadenopathy:  Has no cervical adenopathy.  Neurological: Pt is alert and oriented to person, place, and time. Pt has normal reflexes. No cranial nerve deficit. Motor grossly intact Skin: Skin is warm and dry. No rash noted.  Psychiatric:  Has normal mood and affect. Behavior is normal.      Assessment & Plan:

## 2014-05-30 NOTE — Patient Instructions (Addendum)
OK to decrease the levothyroxine (thyroid medication) to 88 mcg per day  Please take all new medication as prescribed - the nasal spray for allergies  Please continue all other medications as before, and refills have been done if requested.  Please have the pharmacy call with any other refills you may need.  Please continue your efforts at being more active, low cholesterol diet, and weight control.  You are otherwise up to date with prevention measures today.  Please keep your appointments with your specialists as you may have planned  Your blood work recently was OK.  Please return in 6 months, or sooner if needed, with Lab testing done 3-5 days before

## 2014-05-30 NOTE — Assessment & Plan Note (Signed)
Also for flonase asd,  to f/u any worsening symptoms or concerns, consider allergy referral

## 2014-05-30 NOTE — Assessment & Plan Note (Signed)
stable overall by history and exam, recent data reviewed with pt, and pt to continue medical treatment as before,  to f/u any worsening symptoms or concerns Lab Results  Component Value Date   HGBA1C 6.7* 04/03/2014

## 2014-06-11 ENCOUNTER — Telehealth: Payer: Self-pay | Admitting: Internal Medicine

## 2014-06-11 NOTE — Telephone Encounter (Signed)
Pt called in and said that med change has made her feet swell and some other side effects as well, she was requesting a call back from nurse.

## 2014-06-11 NOTE — Telephone Encounter (Signed)
Change in dose of Levothyroxine.  Starting January 2nd, blood sugars have been 110 - 140 in the am before medication and will read around 95-105 after medication.  Bilateral feet and legs swelling with redness. Patient states she does elevate her feet and legs at night and still sees no relief in the morning. This began after dose change in levothyroxine.  Please advise. JG//CMA

## 2014-06-11 NOTE — Telephone Encounter (Signed)
I am sure these concerns are not related to the thyroid. OK to cont same dose.  O/w can be d/w pt at next visit

## 2014-06-11 NOTE — Telephone Encounter (Signed)
Called and informed patient. She verbalized understanding and will schedule OV if swelling persists. JG//CMA

## 2014-06-13 ENCOUNTER — Ambulatory Visit (INDEPENDENT_AMBULATORY_CARE_PROVIDER_SITE_OTHER): Payer: Medicare Other | Admitting: Internal Medicine

## 2014-06-13 ENCOUNTER — Encounter: Payer: Self-pay | Admitting: Internal Medicine

## 2014-06-13 ENCOUNTER — Telehealth: Payer: Self-pay | Admitting: Internal Medicine

## 2014-06-13 VITALS — BP 172/92 | HR 95 | Temp 97.8°F | Ht 66.0 in | Wt 172.5 lb

## 2014-06-13 DIAGNOSIS — I10 Essential (primary) hypertension: Secondary | ICD-10-CM

## 2014-06-13 DIAGNOSIS — E083299 Diabetes mellitus due to underlying condition with mild nonproliferative diabetic retinopathy without macular edema, unspecified eye: Secondary | ICD-10-CM

## 2014-06-13 DIAGNOSIS — E08329 Diabetes mellitus due to underlying condition with mild nonproliferative diabetic retinopathy without macular edema: Secondary | ICD-10-CM

## 2014-06-13 DIAGNOSIS — R600 Localized edema: Secondary | ICD-10-CM

## 2014-06-13 DIAGNOSIS — R609 Edema, unspecified: Secondary | ICD-10-CM

## 2014-06-13 DIAGNOSIS — E785 Hyperlipidemia, unspecified: Secondary | ICD-10-CM

## 2014-06-13 MED ORDER — LOSARTAN POTASSIUM-HCTZ 100-25 MG PO TABS: 1.0000 | ORAL_TABLET | Freq: Every day | ORAL | Status: DC

## 2014-06-13 NOTE — Telephone Encounter (Signed)
Notified patient.

## 2014-06-13 NOTE — Patient Instructions (Signed)
OK to stop the losartan-HCT 50/12.5 mg  Please take all new medication as prescribed - the losartan-HCT 100/25 mg  You will be contacted regarding the referral for: Echocardiogram  Please continue all other medications as before, and refills have been done if requested.  Please have the pharmacy call with any other refills you may need.  Please keep your appointments with your specialists as you may have planned

## 2014-06-13 NOTE — Progress Notes (Signed)
Pre visit review using our clinic review tool, if applicable. No additional management support is needed unless otherwise documented below in the visit note. 

## 2014-06-13 NOTE — Telephone Encounter (Signed)
rx now sent

## 2014-06-13 NOTE — Progress Notes (Signed)
Subjective:    Patient ID: Michelle Morton, female    DOB: [birth year], 81 y.o.   MRN: [id number]  HPI Here to f/u; overall doing ok,  Pt denies chest pain, increased sob or doe, wheezing, orthopnea, PND, palpitations, dizziness or syncope, except for worsening LE pedal edema x 2 wks.   Pt denies polydipsia, polyuria, or low sugar symptoms such as weakness or confusion improved with po intake.  Pt denies new neurological symptoms such as new headache, or facial or extremity weakness or numbness.   Pt states overall good compliance with meds, has been trying to follow lower cholesterol, diabetic diet, with wt overall stable Past Medical History  Diagnosis Date  . Hypertension   . Arthus phenomenon   . Hyperlipidemia   . Diabetes mellitus, type 2   . Hypothyroidism   . Allergic rhinitis, cause unspecified 02/14/2013  . SVD (spontaneous vaginal delivery)     x 4  . Varicose veins     lower legs  . Osteoarthritis, knee     knees - otc med prn  . History of blood transfusion     [facility] - unsure number of units transfused  . Hearing loss     right ear, no hearing aid   Past Surgical History  Procedure Laterality Date  . Abdominal hysterectomy    . Replacement total knee      left  knee '06  . Knee arthroplasty      left  . Right knee replacement    . Tonsillectomy    . Wisdom tooth extraction    . Eye surgery      bilateral cataract eye surgery  . Appendectomy    . Joint replacement      right and left knee  . Colonoscopy    . Kidney stone surgery      removal of stone  . Anterior and posterior repair N/A 04/17/2014    Procedure: ANTERIOR (CYSTOCELE) AND POSTERIOR REPAIR (RECTOCELE);  Surgeon: Lavina Hammanodd Meisinger, MD;  Location: [facility];  Service: Gynecology;  Laterality: N/A;    reports that she quit smoking about 22 years ago. Her smoking use included Cigarettes. She has a 16 pack-year smoking history. She has never used smokeless tobacco. She reports that she does not drink  alcohol or use illicit drugs. family history includes Cancer in her daughter; Diabetes in her father; Hypertension in her father. Allergies  Allergen Reactions  . Penicillins Rash    Allergic to IV penicillin but tolerates the oral form   Current Outpatient Prescriptions on File Prior to Visit  Medication Sig Dispense Refill  . BIOTIN PO Take 1 tablet by mouth daily.    . cetirizine (ZYRTEC) 10 MG tablet Take 10 mg by mouth daily.    . fluticasone (FLONASE) 50 MCG/ACT nasal spray Place 2 sprays into both nostrils daily. 16 g 5  . glucose blood test strip Use as instructed  250.02 100 each 12  . ibuprofen (ADVIL,MOTRIN) 200 MG tablet Take 200 mg by mouth every 6 (six) hours as needed for moderate pain.     Marland Kitchen. ibuprofen (ADVIL,MOTRIN) 600 MG tablet Take 1 tablet (600 mg total) by mouth every 6 (six) hours as needed (mild pain). 30 tablet 0  . Lancets MISC Use as directed 1 per day 100 each 12  . levothyroxine (SYNTHROID, LEVOTHROID) 88 MCG tablet Take 1 tablet (88 mcg total) by mouth daily. 90 tablet 3  . metFORMIN (GLUCOPHAGE) 850 MG tablet Take 1  tablet (850 mg total) by mouth 2 (two) times daily with a meal. 60 tablet 11  . naproxen sodium (ANAPROX) 220 MG tablet Take 220 mg by mouth daily as needed (pain).     . pravastatin (PRAVACHOL) 40 MG tablet Take 1 tablet (40 mg total) by mouth daily. 30 tablet 8  . vitamin C (ASCORBIC ACID) 500 MG tablet Take 500 mg by mouth daily.     No current facility-administered medications on file prior to visit.   Review of Systems  Constitutional: Negative for unusual diaphoresis or other sweats  HENT: Negative for ringing in ear Eyes: Negative for double vision or worsening visual disturbance.  Respiratory: Negative for choking and stridor.   Gastrointestinal: Negative for vomiting or other signifcant bowel change Genitourinary: Negative for hematuria or decreased urine volume.  Musculoskeletal: Negative for other MSK pain or swelling Skin: Negative  for color change and worsening wound.  Neurological: Negative for tremors and numbness other than noted  Psychiatric/Behavioral: Negative for decreased concentration or agitation other than above       Objective:   Physical Exam BP 172/92 mmHg  Pulse 95  Temp(Src) 97.8 F (36.6 C) (Oral)  Ht  (1.676 m)  Wt 172 lb 8 oz (78.245 kg)  BMI 27.86 kg/m2  SpO2 96% VS noted,  Constitutional: Pt appears well-developed, well-nourished.  HENT: Head: NCAT.  Right Ear: External ear normal.  Left Ear: External ear normal.  Eyes: . Pupils are equal, round, and reactive to light. Conjunctivae and EOM are normal Neck: Normal range of motion. Neck supple.  Cardiovascular: Normal rate and regular rhythm.   Pulmonary/Chest: Effort normal and breath sounds without rales or wheezing.  Abd:  Soft, NT, ND, + BS Neurological: Pt is alert. Not confused , motor grossly intact Skin: Skin is warm. No rash, 1+ bilat pedal edema Psychiatric: Pt behavior is normal. No agitation.   Wt Readings from Last 3 Encounters:  06/13/14 172 lb 8 oz (78.245 kg)  05/30/14 174 lb 12 oz (79.266 kg)  04/09/14 179 lb (81.194 kg)   BP Readings from Last 3 Encounters:  06/13/14 172/92  05/30/14 144/82  04/09/14 153/87         Assessment & Plan:

## 2014-06-13 NOTE — Telephone Encounter (Signed)
[facility] [address]

## 2014-06-16 NOTE — Assessment & Plan Note (Signed)
stable overall by history and exam, recent data reviewed with pt, and pt to continue medical treatment as before,  to f/u any worsening symptoms or concerns Lab Results  Component Value Date   LDLCALC 79 04/03/2014

## 2014-06-16 NOTE — Assessment & Plan Note (Addendum)
elev today o/w stable overall by history and exam, recent data reviewed with pt, and pt to change to higher dose ARB/diuretic,  to f/u any worsening symptoms or concerns BP Readings from Last 3 Encounters:  06/13/14 172/92  05/30/14 144/82  04/09/14 153/87

## 2014-06-16 NOTE — Assessment & Plan Note (Signed)
?   Venous insuff, vs other/chf  - for increased HCT, echocardiogram,  to f/u any worsening symptoms or concerns

## 2014-06-16 NOTE — Assessment & Plan Note (Signed)
stable overall by history and exam, recent data reviewed with pt, and pt to continue medical treatment as before,  to f/u any worsening symptoms or concerns Lab Results  Component Value Date   HGBA1C 6.7* 04/03/2014    

## 2014-06-17 ENCOUNTER — Encounter: Payer: Self-pay | Admitting: Internal Medicine

## 2014-07-22 ENCOUNTER — Telehealth: Payer: Self-pay | Admitting: Internal Medicine

## 2014-07-22 NOTE — Telephone Encounter (Signed)
auth # [id number] auth for 07/03/2014-08/17/2014 [id number]  Patient calling to follow up on order.

## 2014-07-31 NOTE — Telephone Encounter (Signed)
Pt scheduled for 08/05/14. She is aware.

## 2014-08-05 ENCOUNTER — Ambulatory Visit (HOSPITAL_COMMUNITY): Payer: Medicare Other | Attending: Internal Medicine | Admitting: Cardiology

## 2014-08-05 ENCOUNTER — Other Ambulatory Visit (HOSPITAL_COMMUNITY): Payer: Self-pay | Admitting: Internal Medicine

## 2014-08-05 DIAGNOSIS — I10 Essential (primary) hypertension: Secondary | ICD-10-CM | POA: Insufficient documentation

## 2014-08-05 DIAGNOSIS — R600 Localized edema: Secondary | ICD-10-CM

## 2014-08-05 DIAGNOSIS — R609 Edema, unspecified: Secondary | ICD-10-CM | POA: Diagnosis not present

## 2014-08-05 NOTE — Progress Notes (Signed)
Echo performed. 

## 2014-08-06 ENCOUNTER — Encounter: Payer: Self-pay | Admitting: Internal Medicine

## 2014-09-02 ENCOUNTER — Other Ambulatory Visit: Payer: Self-pay | Admitting: Internal Medicine

## 2014-10-01 ENCOUNTER — Ambulatory Visit (INDEPENDENT_AMBULATORY_CARE_PROVIDER_SITE_OTHER): Payer: Medicare Other | Admitting: Internal Medicine

## 2014-10-01 ENCOUNTER — Encounter: Payer: Self-pay | Admitting: Internal Medicine

## 2014-10-01 ENCOUNTER — Other Ambulatory Visit (INDEPENDENT_AMBULATORY_CARE_PROVIDER_SITE_OTHER): Payer: Medicare Other

## 2014-10-01 VITALS — BP 122/78 | HR 88 | Temp 98.7°F | Resp 18 | Ht 66.0 in | Wt 167.0 lb

## 2014-10-01 DIAGNOSIS — I10 Essential (primary) hypertension: Secondary | ICD-10-CM

## 2014-10-01 DIAGNOSIS — E039 Hypothyroidism, unspecified: Secondary | ICD-10-CM

## 2014-10-01 DIAGNOSIS — E083299 Diabetes mellitus due to underlying condition with mild nonproliferative diabetic retinopathy without macular edema, unspecified eye: Secondary | ICD-10-CM

## 2014-10-01 DIAGNOSIS — E785 Hyperlipidemia, unspecified: Secondary | ICD-10-CM

## 2014-10-01 DIAGNOSIS — Z0000 Encounter for general adult medical examination without abnormal findings: Secondary | ICD-10-CM | POA: Diagnosis not present

## 2014-10-01 DIAGNOSIS — E08329 Diabetes mellitus due to underlying condition with mild nonproliferative diabetic retinopathy without macular edema: Secondary | ICD-10-CM

## 2014-10-01 LAB — HEPATIC FUNCTION PANEL
ALBUMIN: 3.9 g/dL (ref 3.5–5.2)
ALK PHOS: 42 U/L (ref 39–117)
ALT: 10 U/L (ref 0–35)
AST: 16 U/L (ref 0–37)
BILIRUBIN DIRECT: 0.1 mg/dL (ref 0.0–0.3)
Total Bilirubin: 0.5 mg/dL (ref 0.2–1.2)
Total Protein: 7.4 g/dL (ref 6.0–8.3)

## 2014-10-01 LAB — LIPID PANEL
Cholesterol: 152 mg/dL (ref 0–200)
HDL: 53.9 mg/dL (ref >39.00)
LDL Cholesterol: 82 mg/dL (ref 0–99)
NONHDL: 98.1
Total CHOL/HDL Ratio: 3
Triglycerides: 81 mg/dL (ref 0.0–149.0)
VLDL: 16.2 mg/dL (ref 0.0–40.0)

## 2014-10-01 LAB — CBC WITH DIFFERENTIAL/PLATELET
BASOS PCT: 0.6 % (ref 0.0–3.0)
Basophils Absolute: 0 10*3/uL (ref 0.0–0.1)
Eosinophils Absolute: 0.3 10*3/uL (ref 0.0–0.7)
Eosinophils Relative: 4 % (ref 0.0–5.0)
HEMATOCRIT: 36.3 % (ref 36.0–46.0)
Hemoglobin: 11.8 g/dL — ABNORMAL LOW (ref 12.0–15.0)
Lymphocytes Relative: 35.1 % (ref 12.0–46.0)
Lymphs Abs: 2.8 10*3/uL (ref 0.7–4.0)
MCHC: 32.5 g/dL (ref 30.0–36.0)
MCV: 90.8 fl (ref 78.0–100.0)
MONO ABS: 0.4 10*3/uL (ref 0.1–1.0)
Monocytes Relative: 5.2 % (ref 3.0–12.0)
Neutro Abs: 4.5 10*3/uL (ref 1.4–7.7)
Neutrophils Relative %: 55.1 % (ref 43.0–77.0)
PLATELETS: 317 10*3/uL (ref 150.0–400.0)
RBC: 3.99 Mil/uL (ref 3.87–5.11)
RDW: 14.9 % (ref 11.5–15.5)
WBC: 8.1 10*3/uL (ref 4.0–10.5)

## 2014-10-01 LAB — URINALYSIS, ROUTINE W REFLEX MICROSCOPIC
Bilirubin Urine: NEGATIVE
HGB URINE DIPSTICK: NEGATIVE
LEUKOCYTES UA: NEGATIVE
NITRITE: NEGATIVE
RBC / HPF: NONE SEEN (ref 0–?)
Specific Gravity, Urine: 1.015 (ref 1.000–1.030)
Total Protein, Urine: NEGATIVE
UROBILINOGEN UA: 0.2 (ref 0.0–1.0)
Urine Glucose: NEGATIVE
pH: 6 (ref 5.0–8.0)

## 2014-10-01 LAB — BASIC METABOLIC PANEL
BUN: 42 mg/dL — AB (ref 6–23)
CALCIUM: 9.5 mg/dL (ref 8.4–10.5)
CO2: 26 mEq/L (ref 19–32)
Chloride: 101 mEq/L (ref 96–112)
Creatinine, Ser: 1.77 mg/dL — ABNORMAL HIGH (ref 0.40–1.20)
GFR: 35.44 mL/min — ABNORMAL LOW (ref >60.00)
GLUCOSE: 95 mg/dL (ref 70–99)
POTASSIUM: 4.7 meq/L (ref 3.5–5.1)
Sodium: 135 mEq/L (ref 135–145)

## 2014-10-01 LAB — MICROALBUMIN / CREATININE URINE RATIO
CREATININE, U: 130.3 mg/dL
MICROALB UR: 2.9 mg/dL — AB (ref 0.0–1.9)
MICROALB/CREAT RATIO: 2.2 mg/g (ref 0.0–30.0)

## 2014-10-01 LAB — TSH: TSH: 1.1 u[IU]/mL (ref 0.35–4.50)

## 2014-10-01 LAB — HEMOGLOBIN A1C: HEMOGLOBIN A1C: 6.6 % — AB (ref 4.6–6.5)

## 2014-10-01 NOTE — Assessment & Plan Note (Signed)
stable overall by history and exam, recent data reviewed with pt, and pt to continue medical treatment as before,  to f/u any worsening symptoms or concerns Lab Results  Component Value Date   HGBA1C 6.7* 04/03/2014    

## 2014-10-01 NOTE — Progress Notes (Signed)
Subjective:    Patient ID: Michelle Morton, female    DOB: 1931/10/20, 81 y.o.   MRN: [id number]  HPI  Here for wellness and f/u;  Overall doing ok;  Pt denies Chest pain, worsening SOB, DOE, wheezing, orthopnea, PND, worsening LE edema, palpitations, dizziness or syncope.  Pt denies neurological change such as new headache, facial or extremity weakness.  Pt denies polydipsia, polyuria, or low sugar symptoms. Pt states overall good compliance with treatment and medications, good tolerability, and has been trying to follow appropriate diet.  Pt denies worsening depressive symptoms, suicidal ideation or panic. No fever, night sweats, wt loss, loss of appetite, or other constitutional symptoms.  Pt states good ability with ADL's, has low fall risk, home safety reviewed and adequate, no other significant changes in hearing or vision, and only occasionally active with exercise.  No current complaints Past Medical History  Diagnosis Date  . Hypertension   . Arthus phenomenon   . Hyperlipidemia   . Diabetes mellitus, type 2   . Hypothyroidism   . Allergic rhinitis, cause unspecified 02/14/2013  . SVD (spontaneous vaginal delivery)     x 4  . Varicose veins     lower legs  . Osteoarthritis, knee     knees - otc med prn  . History of blood transfusion     [name] - unsure number of units transfused  . Hearing loss     right ear, no hearing aid   Past Surgical History  Procedure Laterality Date  . Abdominal hysterectomy    . Replacement total knee      left  knee '06  . Knee arthroplasty      left  . Right knee replacement    . Tonsillectomy    . Wisdom tooth extraction    . Eye surgery      bilateral cataract eye surgery  . Appendectomy    . Joint replacement      right and left knee  . Colonoscopy    . Kidney stone surgery      removal of stone  . Anterior and posterior repair N/A 04/17/2014    Procedure: ANTERIOR (CYSTOCELE) AND POSTERIOR REPAIR (RECTOCELE);  Surgeon: Lavina Hamman, MD;  Location: [facility];  Service: Gynecology;  Laterality: N/A;    reports that she quit smoking about 22 years ago. Her smoking use included Cigarettes. She has a 16 pack-year smoking history. She has never used smokeless tobacco. She reports that she does not drink alcohol or use illicit drugs. family history includes Cancer in her daughter; Diabetes in her father; Hypertension in her father. Allergies  Allergen Reactions  . Penicillins Rash    Allergic to IV penicillin but tolerates the oral form   Current Outpatient Prescriptions on File Prior to Visit  Medication Sig Dispense Refill  . BIOTIN PO Take 1 tablet by mouth daily.    . cetirizine (ZYRTEC) 10 MG tablet Take 10 mg by mouth daily.    . fluticasone (FLONASE) 50 MCG/ACT nasal spray Place 2 sprays into both nostrils daily. 16 g 5  . glucose blood test strip Use as instructed  250.02 100 each 12  . ibuprofen (ADVIL,MOTRIN) 200 MG tablet Take 200 mg by mouth every 6 (six) hours as needed for moderate pain.     Marland Kitchen ibuprofen (ADVIL,MOTRIN) 600 MG tablet Take 1 tablet (600 mg total) by mouth every 6 (six) hours as needed (mild pain). 30 tablet 0  . Lancets MISC Use  as directed 1 per day 100 each 12  . levothyroxine (SYNTHROID, LEVOTHROID) 88 MCG tablet Take 1 tablet (88 mcg total) by mouth daily. 90 tablet 3  . losartan-hydrochlorothiazide (HYZAAR) 100-25 MG per tablet Take 1 tablet by mouth daily. 90 tablet 3  . metFORMIN (GLUCOPHAGE) 850 MG tablet Take 1 tablet (850 mg total) by mouth 2 (two) times daily with a meal. 60 tablet 11  . naproxen sodium (ANAPROX) 220 MG tablet Take 220 mg by mouth daily as needed (pain).     . pravastatin (PRAVACHOL) 40 MG tablet TAKE ONE TABLET BY MOUTH ONCE DAILY 30 tablet 5  . vitamin C (ASCORBIC ACID) 500 MG tablet Take 500 mg by mouth daily.     No current facility-administered medications on file prior to visit.   Review of Systems Constitutional: Negative for increased diaphoresis, other  activity, appetite or siginficant weight change other than noted HENT: Negative for worsening hearing loss, ear pain, facial swelling, mouth sores and neck stiffness.   Eyes: Negative for other worsening pain, redness or visual disturbance.  Respiratory: Negative for shortness of breath and wheezing  Cardiovascular: Negative for chest pain and palpitations.  Gastrointestinal: Negative for diarrhea, blood in stool, abdominal distention or other pain Genitourinary: Negative for hematuria, flank pain or change in urine volume.  Musculoskeletal: Negative for myalgias or other joint complaints.  Skin: Negative for color change and wound or drainage.  Neurological: Negative for syncope and numbness. other than noted Hematological: Negative for adenopathy. or other swelling Psychiatric/Behavioral: Negative for hallucinations, SI, self-injury, decreased concentration or other worsening agitation.      Objective:   Physical Exam BP 122/78 mmHg  Pulse 88  Temp(Src) 98.7 F (37.1 C) (Oral)  Resp 18  Ht 5\' 6"  (1.676 m)  Wt 167 lb 0.6 oz (75.769 kg)  BMI 26.97 kg/m2  SpO2 95% VS noted,  Constitutional: Pt is oriented to person, place, and time. Appears well-developed and well-nourished, in no significant distress Head: Normocephalic and atraumatic.  Right Ear: External ear normal.  Left Ear: External ear normal.  Nose: Nose normal.  Mouth/Throat: Oropharynx is clear and moist.  Eyes: Conjunctivae and EOM are normal. Pupils are equal, round, and reactive to light.  Neck: Normal range of motion. Neck supple. No JVD present. No tracheal deviation present or significant neck LA or mass Cardiovascular: Normal rate, regular rhythm, normal heart sounds and intact distal pulses.   Pulmonary/Chest: Effort normal and breath sounds without rales or wheezing  Abdominal: Soft. Bowel sounds are normal. NT. No HSM  Musculoskeletal: Normal range of motion. Exhibits trace edema with varicose veins.    Lymphadenopathy:  Has no cervical adenopathy.  Neurological: Pt is alert and oriented to person, place, and time. Pt has normal reflexes. No cranial nerve deficit. Motor grossly intact Skin: Skin is warm and dry. No rash noted.  Psychiatric:  Has normal mood and affect. Behavior is normal.     Assessment & Plan:

## 2014-10-01 NOTE — Assessment & Plan Note (Signed)

## 2014-10-01 NOTE — Patient Instructions (Signed)

## 2014-10-01 NOTE — Progress Notes (Signed)
Pre visit review using our clinic review tool, if applicable. No additional management support is needed unless otherwise documented below in the visit note. 

## 2014-10-02 ENCOUNTER — Encounter: Payer: Self-pay | Admitting: Internal Medicine

## 2014-10-03 ENCOUNTER — Ambulatory Visit: Payer: Medicare (Managed Care) | Admitting: Internal Medicine

## 2014-10-09 ENCOUNTER — Ambulatory Visit (INDEPENDENT_AMBULATORY_CARE_PROVIDER_SITE_OTHER): Payer: Medicare Other | Admitting: Internal Medicine

## 2014-10-09 ENCOUNTER — Other Ambulatory Visit (INDEPENDENT_AMBULATORY_CARE_PROVIDER_SITE_OTHER): Payer: Medicare Other

## 2014-10-09 ENCOUNTER — Other Ambulatory Visit: Payer: Self-pay | Admitting: Internal Medicine

## 2014-10-09 ENCOUNTER — Encounter: Payer: Self-pay | Admitting: Internal Medicine

## 2014-10-09 VITALS — BP 122/88 | HR 89 | Temp 98.0°F | Resp 18 | Ht 66.0 in | Wt 163.1 lb

## 2014-10-09 DIAGNOSIS — I10 Essential (primary) hypertension: Secondary | ICD-10-CM | POA: Diagnosis not present

## 2014-10-09 DIAGNOSIS — N179 Acute kidney failure, unspecified: Secondary | ICD-10-CM

## 2014-10-09 DIAGNOSIS — M179 Osteoarthritis of knee, unspecified: Secondary | ICD-10-CM

## 2014-10-09 DIAGNOSIS — IMO0002 Reserved for concepts with insufficient information to code with codable children: Secondary | ICD-10-CM

## 2014-10-09 DIAGNOSIS — M1710 Unilateral primary osteoarthritis, unspecified knee: Secondary | ICD-10-CM

## 2014-10-09 LAB — BASIC METABOLIC PANEL
BUN: 40 mg/dL — ABNORMAL HIGH (ref 6–23)
CALCIUM: 9.9 mg/dL (ref 8.4–10.5)
CO2: 28 mEq/L (ref 19–32)
Chloride: 103 mEq/L (ref 96–112)
Creatinine, Ser: 1.65 mg/dL — ABNORMAL HIGH (ref 0.40–1.20)
GFR: 38.43 mL/min — ABNORMAL LOW (ref >60.00)
Glucose, Bld: 122 mg/dL — ABNORMAL HIGH (ref 70–99)
POTASSIUM: 4.2 meq/L (ref 3.5–5.1)
SODIUM: 138 meq/L (ref 135–145)

## 2014-10-09 MED ORDER — LOSARTAN POTASSIUM-HCTZ 100-25 MG PO TABS: ORAL_TABLET | ORAL | Status: DC

## 2014-10-09 MED ORDER — TRAMADOL HCL 50 MG PO TABS: 50.0000 mg | ORAL_TABLET | Freq: Four times a day (QID) | ORAL | Status: DC | PRN

## 2014-10-09 MED ORDER — DICLOFENAC SODIUM 1 % TD GEL: 4.0000 g | Freq: Four times a day (QID) | TRANSDERMAL | Status: DC

## 2014-10-09 NOTE — Progress Notes (Signed)
Pre visit review using our clinic review tool, if applicable. No additional management support is needed unless otherwise documented below in the visit note. 

## 2014-10-09 NOTE — Patient Instructions (Signed)
OK to stop the anti-inflammatories (pills)  Please take all new medication as prescribed - the voltaren gel (for the knees) and tramadol pain medication if needed  Please continue all other medications as before, including taking HALF of the blood pressure medication (half of the Hyzaar)  Please have the pharmacy call with any other refills you may need.  Please keep your appointments with your specialists as you may have planned  Please go to the LAB in the Basement (turn left off the elevator) for the tests to be done today  You will be contacted by phone if any changes need to be made immediately.  Otherwise, you will receive a letter about your results with an explanation, but please check with MyChart first.  Please remember to sign up for MyChart if you have not done so, as this will be important to you in the future with finding out test results, communicating by private email, and scheduling acute appointments online when needed.

## 2014-10-09 NOTE — Progress Notes (Signed)
Subjective:    Patient ID: Michelle Morton, female    DOB: 1931-07-09, 81 y.o.   MRN: [id number]  HPI  Here to f/u recent med changes after finding AKI with recent lab 1 wk ago, has stopped her nsaid and only taking HALF of her hyzaar 100/25 qd, Pt denies chest pain, increased sob or doe, wheezing, orthopnea, PND, increased LE swelling, palpitations, dizziness or syncope.  Pt denies new neurological symptoms such as new headache, or facial or extremity weakness or numbness   Pt denies polydipsia, polyuria. Has ongoing knee pain bilat with stopping the nsaid, no giveaways, overall mild to mod Past Medical History  Diagnosis Date  . Hypertension   . Arthus phenomenon   . Hyperlipidemia   . Diabetes mellitus, type 2   . Hypothyroidism   . Allergic rhinitis, cause unspecified 02/14/2013  . SVD (spontaneous vaginal delivery)     x 4  . Varicose veins     lower legs  . Osteoarthritis, knee     knees - otc med prn  . History of blood transfusion     [facility] - unsure number of units transfused  . Hearing loss     right ear, no hearing aid   Past Surgical History  Procedure Laterality Date  . Abdominal hysterectomy    . Replacement total knee      left  knee '06  . Knee arthroplasty      left  . Right knee replacement    . Tonsillectomy    . Wisdom tooth extraction    . Eye surgery      bilateral cataract eye surgery  . Appendectomy    . Joint replacement      right and left knee  . Colonoscopy    . Kidney stone surgery      removal of stone  . Anterior and posterior repair N/A 04/17/2014    Procedure: ANTERIOR (CYSTOCELE) AND POSTERIOR REPAIR (RECTOCELE);  Surgeon: Lavina Hammanodd Meisinger, MD;  Location: [facility];  Service: Gynecology;  Laterality: N/A;    reports that she quit smoking about 22 years ago. Her smoking use included Cigarettes. She has a 16 pack-year smoking history. She has never used smokeless tobacco. She reports that she does not drink alcohol or use illicit  drugs. family history includes Cancer in her daughter; Diabetes in her father; Hypertension in her father. Allergies  Allergen Reactions  . Penicillins Rash    Allergic to IV penicillin but tolerates the oral form   Current Outpatient Prescriptions on File Prior to Visit  Medication Sig Dispense Refill  . BIOTIN PO Take 1 tablet by mouth daily.    . cetirizine (ZYRTEC) 10 MG tablet Take 10 mg by mouth daily.    . fluticasone (FLONASE) 50 MCG/ACT nasal spray Place 2 sprays into both nostrils daily. 16 g 5  . glucose blood test strip Use as instructed  250.02 100 each 12  . Lancets MISC Use as directed 1 per day 100 each 12  . levothyroxine (SYNTHROID, LEVOTHROID) 88 MCG tablet Take 1 tablet (88 mcg total) by mouth daily. 90 tablet 3  . losartan-hydrochlorothiazide (HYZAAR) 100-25 MG per tablet Take 1 tablet by mouth daily. 90 tablet 3  . metFORMIN (GLUCOPHAGE) 850 MG tablet Take 1 tablet (850 mg total) by mouth 2 (two) times daily with a meal. 60 tablet 11  . naproxen sodium (ANAPROX) 220 MG tablet Take 220 mg by mouth daily as needed (pain).     .Marland Kitchen  pravastatin (PRAVACHOL) 40 MG tablet TAKE ONE TABLET BY MOUTH ONCE DAILY 30 tablet 5  . vitamin C (ASCORBIC ACID) 500 MG tablet Take 500 mg by mouth daily.    Marland Kitchen. ibuprofen (ADVIL,MOTRIN) 200 MG tablet Take 200 mg by mouth every 6 (six) hours as needed for moderate pain.      No current facility-administered medications on file prior to visit.   Review of Systems  Constitutional: Negative for unusual diaphoresis or night sweats HENT: Negative for ringing in ear or discharge Eyes: Negative for double vision or worsening visual disturbance.  Respiratory: Negative for choking and stridor.   Gastrointestinal: Negative for vomiting or other signifcant bowel change Genitourinary: Negative for hematuria or change in urine volume.  Musculoskeletal: Negative for other MSK pain or swelling Skin: Negative for color change and worsening wound.   Neurological: Negative for tremors and numbness other than noted  Psychiatric/Behavioral: Negative for decreased concentration or agitation other than above       Objective:   Physical Exam BP 122/88 mmHg  Pulse 89  Temp(Src) 98 F (36.7 C) (Oral)  Resp 18  Ht 5\' 6"  (1.676 m)  Wt 163 lb 1.3 oz (73.973 kg)  BMI 26.33 kg/m2  SpO2 98% VS noted,  Constitutional: Pt appears in no significant distress HENT: Head: NCAT.  Right Ear: External ear normal.  Left Ear: External ear normal.  Eyes: . Pupils are equal, round, and reactive to light. Conjunctivae and EOM are normal Neck: Normal range of motion. Neck supple.  Cardiovascular: Normal rate and regular rhythm.   Pulmonary/Chest: Effort normal and breath sounds without rales or wheezing.  Abd:  Soft, NT, ND, + BS Neurological: Pt is alert. Not confused , motor grossly intact Skin: Skin is warm. No rash, no LE edema Psychiatric: Pt behavior is normal. No agitation.  Bilat knee bony deg changes noted, no effusion      Assessment & Plan:

## 2014-10-09 NOTE — Assessment & Plan Note (Addendum)
Now off nsaid, and half of prior hyzaar, for f/u lab today  Lab Results  Component Value Date   WBC 8.1 10/01/2014   HGB 11.8* 10/01/2014   HCT 36.3 10/01/2014   PLT 317.0 10/01/2014   GLUCOSE 95 10/01/2014   CHOL 152 10/01/2014   TRIG 81.0 10/01/2014   HDL 53.90 10/01/2014   LDLCALC 82 10/01/2014   ALT 10 10/01/2014   AST 16 10/01/2014   NA 135 10/01/2014   K 4.7 10/01/2014   CL 101 10/01/2014   CREATININE 1.77* 10/01/2014   BUN 42* 10/01/2014   CO2 26 10/01/2014   TSH 1.10 10/01/2014   INR 1.0 05/03/2008   HGBA1C 6.6* 10/01/2014   MICROALBUR 2.9* 10/01/2014

## 2014-10-09 NOTE — Assessment & Plan Note (Signed)
Ok for volt gel prn, trmadol prn,  to f/u any worsening symptoms or concerns

## 2014-10-09 NOTE — Assessment & Plan Note (Signed)
stable overall by history and exam, recent data reviewed with pt, and pt to continue medical treatment as before,  to f/u any worsening symptoms or concerns BP Readings from Last 3 Encounters:  10/09/14 122/88  10/01/14 122/78  06/13/14 172/92

## 2014-10-10 ENCOUNTER — Encounter: Payer: Self-pay | Admitting: Internal Medicine

## 2014-10-10 ENCOUNTER — Other Ambulatory Visit: Payer: Self-pay | Admitting: Internal Medicine

## 2014-10-10 DIAGNOSIS — N289 Disorder of kidney and ureter, unspecified: Secondary | ICD-10-CM

## 2014-10-15 ENCOUNTER — Telehealth: Payer: Self-pay

## 2014-10-15 DIAGNOSIS — N289 Disorder of kidney and ureter, unspecified: Secondary | ICD-10-CM

## 2014-10-15 NOTE — Telephone Encounter (Signed)
[facility] called stating that order that was placed for U/S Abd for Renal Insufficiency need to be changed to U/S Renal. Please update order

## 2014-10-15 NOTE — Telephone Encounter (Signed)
Order changed, [facility]

## 2014-10-16 NOTE — Telephone Encounter (Signed)
Pt advised order was changed and states that [facility]

## 2014-10-21 ENCOUNTER — Ambulatory Visit
Admission: RE | Admit: 2014-10-21 | Discharge: 2014-10-21 | Disposition: A | Discharge status: Home / Self Care | Payer: Medicare Other | Source: Ambulatory Visit | Attending: Internal Medicine | Admitting: Internal Medicine

## 2014-10-21 DIAGNOSIS — N289 Disorder of kidney and ureter, unspecified: Secondary | ICD-10-CM

## 2014-10-22 ENCOUNTER — Encounter: Payer: Self-pay | Admitting: Internal Medicine

## 2014-11-27 ENCOUNTER — Telehealth: Payer: Self-pay | Admitting: Internal Medicine

## 2014-11-27 NOTE — Telephone Encounter (Signed)
Ok for miralax daily if she is not already taking this, or colace 100 mg twice per day prn

## 2014-11-27 NOTE — Telephone Encounter (Signed)
Patient called regarding having a hard time burping and having a bowel movement after a meal. She did drink some Ginger Ale and did help a little. [name] told her not to take antacids and she is wondering if there is something else OTC that she can use.

## 2014-11-29 ENCOUNTER — Ambulatory Visit: Payer: Medicare Other | Admitting: Internal Medicine

## 2014-12-06 ENCOUNTER — Other Ambulatory Visit (INDEPENDENT_AMBULATORY_CARE_PROVIDER_SITE_OTHER): Payer: Medicare Other

## 2014-12-06 ENCOUNTER — Encounter: Payer: Self-pay | Admitting: Internal Medicine

## 2014-12-06 ENCOUNTER — Ambulatory Visit (INDEPENDENT_AMBULATORY_CARE_PROVIDER_SITE_OTHER)
Admission: RE | Admit: 2014-12-06 | Discharge: 2014-12-06 | Disposition: A | Discharge status: Home / Self Care | Payer: Medicare Other | Source: Ambulatory Visit | Attending: Internal Medicine | Admitting: Internal Medicine

## 2014-12-06 ENCOUNTER — Ambulatory Visit (INDEPENDENT_AMBULATORY_CARE_PROVIDER_SITE_OTHER): Payer: Medicare Other | Admitting: Internal Medicine

## 2014-12-06 VITALS — BP 112/66 | HR 92 | Temp 97.9°F | Ht 66.0 in | Wt 158.0 lb

## 2014-12-06 DIAGNOSIS — R1112 Projectile vomiting: Secondary | ICD-10-CM

## 2014-12-06 DIAGNOSIS — R634 Abnormal weight loss: Secondary | ICD-10-CM

## 2014-12-06 DIAGNOSIS — N179 Acute kidney failure, unspecified: Secondary | ICD-10-CM

## 2014-12-06 DIAGNOSIS — R197 Diarrhea, unspecified: Secondary | ICD-10-CM

## 2014-12-06 DIAGNOSIS — R112 Nausea with vomiting, unspecified: Secondary | ICD-10-CM | POA: Insufficient documentation

## 2014-12-06 LAB — CBC WITH DIFFERENTIAL/PLATELET
BASOS PCT: 0.5 % (ref 0.0–3.0)
Basophils Absolute: 0 10*3/uL (ref 0.0–0.1)
EOS PCT: 5.3 % — AB (ref 0.0–5.0)
Eosinophils Absolute: 0.4 10*3/uL (ref 0.0–0.7)
HCT: 37.9 % (ref 36.0–46.0)
HEMOGLOBIN: 12.2 g/dL (ref 12.0–15.0)
LYMPHS ABS: 3.2 10*3/uL (ref 0.7–4.0)
Lymphocytes Relative: 44.4 % (ref 12.0–46.0)
MCHC: 32.2 g/dL (ref 30.0–36.0)
MCV: 93.3 fl (ref 78.0–100.0)
MONO ABS: 0.4 10*3/uL (ref 0.1–1.0)
Monocytes Relative: 6.1 % (ref 3.0–12.0)
NEUTROS PCT: 43.7 % (ref 43.0–77.0)
Neutro Abs: 3.1 10*3/uL (ref 1.4–7.7)
Platelets: 288 10*3/uL (ref 150.0–400.0)
RBC: 4.06 Mil/uL (ref 3.87–5.11)
RDW: 14.6 % (ref 11.5–15.5)
WBC: 7.2 10*3/uL (ref 4.0–10.5)

## 2014-12-06 LAB — TSH: TSH: 4.04 u[IU]/mL (ref 0.35–4.50)

## 2014-12-06 LAB — URINALYSIS, ROUTINE W REFLEX MICROSCOPIC
Bilirubin Urine: NEGATIVE
HGB URINE DIPSTICK: NEGATIVE
Leukocytes, UA: NEGATIVE
NITRITE: NEGATIVE
SPECIFIC GRAVITY, URINE: 1.02 (ref 1.000–1.030)
URINE GLUCOSE: NEGATIVE
Urobilinogen, UA: 0.2 (ref 0.0–1.0)
pH: 5.5 (ref 5.0–8.0)

## 2014-12-06 LAB — BASIC METABOLIC PANEL
BUN: 40 mg/dL — ABNORMAL HIGH (ref 6–23)
CHLORIDE: 103 meq/L (ref 96–112)
CO2: 26 meq/L (ref 19–32)
Calcium: 9.7 mg/dL (ref 8.4–10.5)
Creatinine, Ser: 2.05 mg/dL — ABNORMAL HIGH (ref 0.40–1.20)
GFR: 29.9 mL/min — ABNORMAL LOW (ref >60.00)
GLUCOSE: 77 mg/dL (ref 70–99)
Potassium: 4.5 mEq/L (ref 3.5–5.1)
SODIUM: 137 meq/L (ref 135–145)

## 2014-12-06 LAB — HEPATIC FUNCTION PANEL
ALBUMIN: 4 g/dL (ref 3.5–5.2)
ALK PHOS: 42 U/L (ref 39–117)
ALT: 6 U/L (ref 0–35)
AST: 12 U/L (ref 0–37)
Bilirubin, Direct: 0.1 mg/dL (ref 0.0–0.3)
Total Bilirubin: 0.5 mg/dL (ref 0.2–1.2)
Total Protein: 7.1 g/dL (ref 6.0–8.3)

## 2014-12-06 LAB — SEDIMENTATION RATE: Sed Rate: 13 mm/hr (ref 0–22)

## 2014-12-06 LAB — LIPASE: LIPASE: 55 U/L (ref 11.0–59.0)

## 2014-12-06 NOTE — Assessment & Plan Note (Addendum)
With lower appetite, etiology unclear, also for cxr, spep today  Note:  Total time for pt hx, exam, review of record with pt in the room, determination of diagnoses and plan for further eval and tx is > 40 min, with over 50% spent in coordination and counseling of patient

## 2014-12-06 NOTE — Assessment & Plan Note (Signed)
?   volume related, not orthostatic today to d/c the hyzaar with lower BP today, f/u labs, has been referred to nephrology as well, may need to d/c metformin if cr persistetnly elev or worse today

## 2014-12-06 NOTE — Patient Instructions (Addendum)
Even though the blood pressure was Ok with standing today, we should STOP the Hyzaar (losartan/HCT) blood pressure pill as the blood pressure is on the lower side and may be contributing to the weakness  Please also stop the Advil and Naproxen if taking these  Please continue all other medications as before, except we may need to stop the metformin if the kidneys are not improved  Please have the pharmacy call with any other refills you may need.  Please keep your appointments with your specialists as you may have planned  You will be contacted regarding the referral for: Gastroenterology asap  Please go to the XRAY Department in the Basement (go straight as you get off the elevator) for the x-ray testing  Please go to the LAB in the Basement (turn left off the elevator) for the tests to be done today  You will be contacted by phone if any changes need to be made immediately.  Otherwise, you will receive a letter about your results with an explanation, but please check with MyChart first.  Please remember to sign up for MyChart if you have not done so, as this will be important to you in the future with finding out test results, communicating by private email, and scheduling acute appointments online when needed.  Please return in 1 month, or sooner if needed

## 2014-12-06 NOTE — Progress Notes (Signed)
Pre visit review using our clinic review tool, if applicable. No additional management support is needed unless otherwise documented below in the visit note. 

## 2014-12-06 NOTE — Progress Notes (Signed)
Subjective:    Patient ID: Michelle Morton, female    DOB: 19-Mar-1932, 81 y.o.   MRN: [id number]  HPI  Here with most recent episode of 2 wks onset abd pain now resolved today, but still have nausea and diarrhea since June 28, Had several episodes vomiting last wk, none this wk,  Stools watery brown, no blood, not sure about fever, seemed to happen about 3 times per day each time she ate, overall lasted about 1 - 2 wks now.Marland Kitchen  Appetite not as good, has lost significnat wt, was 182 in April 2015.   Wt Readings from Last 3 Encounters:  12/06/14 158 lb (71.668 kg)  10/09/14 163 lb 1.3 oz (73.973 kg)  10/01/14 167 lb 0.6 oz (75.769 kg)  Per daughter keeps having these episodes every few wks similar to above since jan 2016, just worse this last time with the weakness.  No falls. Has occas reflux, but no dysphagia, bowel change or blood, except for constipation episode just prior to the recent n/v/d.  Has been drinking ginger ale.  Has been weak, lightheaded on standing.  Last colonoscopy 2004, did not f/u at 12 yrs, Past Medical History  Diagnosis Date  . Hypertension   . Arthus phenomenon   . Hyperlipidemia   . Diabetes mellitus, type 2   . Hypothyroidism   . Allergic rhinitis, cause unspecified 02/14/2013  . SVD (spontaneous vaginal delivery)     x 4  . Varicose veins     lower legs  . Osteoarthritis, knee     knees - otc med prn  . History of blood transfusion     [name] - unsure number of units transfused  . Hearing loss     right ear, no hearing aid   Past Surgical History  Procedure Laterality Date  . Abdominal hysterectomy    . Replacement total knee      left  knee '06  . Knee arthroplasty      left  . Right knee replacement    . Tonsillectomy    . Wisdom tooth extraction    . Eye surgery      bilateral cataract eye surgery  . Appendectomy    . Joint replacement      right and left knee  . Colonoscopy    . Kidney stone surgery      removal of stone  . Anterior and  posterior repair N/A 04/17/2014    Procedure: ANTERIOR (CYSTOCELE) AND POSTERIOR REPAIR (RECTOCELE);  Surgeon: Lavina Hamman, MD;  Location: [facility];  Service: Gynecology;  Laterality: N/A;    reports that she quit smoking about 22 years ago. Her smoking use included Cigarettes. She has a 16 pack-year smoking history. She has never used smokeless tobacco. She reports that she does not drink alcohol or use illicit drugs. family history includes Cancer in her daughter; Diabetes in her father; Hypertension in her father. Allergies  Allergen Reactions  . Penicillins Rash    Allergic to IV penicillin but tolerates the oral form   Current Outpatient Prescriptions on File Prior to Visit  Medication Sig Dispense Refill  . BIOTIN PO Take 1 tablet by mouth daily.    . cetirizine (ZYRTEC) 10 MG tablet Take 10 mg by mouth daily.    . fluticasone (FLONASE) 50 MCG/ACT nasal spray Place 2 sprays into both nostrils daily. 16 g 5  . glucose blood (ONETOUCH VERIO) test strip Use to check blood sugars twice a day Dx E11.9  100 each 3  . levothyroxine (SYNTHROID, LEVOTHROID) 88 MCG tablet Take 1 tablet (88 mcg total) by mouth daily. 90 tablet 3  . losartan-hydrochlorothiazide (HYZAAR) 100-25 MG per tablet 1/2 tab by mouth per day 45 tablet 3  . metFORMIN (GLUCOPHAGE) 850 MG tablet Take 1 tablet (850 mg total) by mouth 2 (two) times daily with a meal. 60 tablet 11  . ONETOUCH DELICA LANCETS 33G MISC Use to check blood sugars twice a day Dx E11.9 100 each 3  . pravastatin (PRAVACHOL) 40 MG tablet TAKE ONE TABLET BY MOUTH ONCE DAILY 30 tablet 5  . vitamin C (ASCORBIC ACID) 500 MG tablet Take 500 mg by mouth daily.    . diclofenac sodium (VOLTAREN) 1 % GEL Apply 4 g topically 4 (four) times daily. (Patient not taking: Reported on 12/06/2014) 400 g 11  . ibuprofen (ADVIL,MOTRIN) 200 MG tablet Take 200 mg by mouth every 6 (six) hours as needed for moderate pain.     . naproxen sodium (ANAPROX) 220 MG tablet Take 220 mg  by mouth daily as needed (pain).     . traMADol (ULTRAM) 50 MG tablet Take 1 tablet (50 mg total) by mouth every 6 (six) hours as needed. (Patient not taking: Reported on 12/06/2014) 120 tablet 2   No current facility-administered medications on file prior to visit.   Review of Systems  Constitutional: Negative for unusual diaphoresis or night sweats HENT: Negative for ringing in ear or discharge Eyes: Negative for double vision or worsening visual disturbance.  Respiratory: Negative for choking and stridor.   Genitourinary: Negative for hematuria or change in urine volume.  Musculoskeletal: Negative for other MSK pain or swelling Skin: Negative for color change and worsening wound.  Neurological: Negative for tremors and numbness other than noted  Psychiatric/Behavioral: Negative for decreased concentration or agitation other than above       Objective:   Physical Exam BP 116/60 mmHg  Pulse 92  Temp(Src) 97.9 F (36.6 C) (Oral)  Ht 5\' 6"  (1.676 m)  Wt 158 lb (71.668 kg)  BMI 25.51 kg/m2  SpO2 96%  VS noted, appears generally weak, orthostatics noted as well - not orthostatic Constitutional: Pt appears in no significant distress HENT: Head: NCAT.  Right Ear: External ear normal.  Left Ear: External ear normal.  Eyes: . Pupils are equal, round, and reactive to light. Conjunctivae and EOM are normal Neck: Normal range of motion. Neck supple.  Cardiovascular: Normal rate and regular rhythm.   Pulmonary/Chest: Effort normal and breath sounds without rales or wheezing.  Abd:  Soft, NT, ND, + BS Neurological: Pt is alert. Not confused , motor grossly intact Skin: Skin is warm. No rash, no LE edema Psychiatric: Pt behavior is normal. No agitation. somewhat irritable todayt     Assessment & Plan:

## 2014-12-06 NOTE — Assessment & Plan Note (Signed)
Etiology unclear, for labs today including c diff as ordered, consider empiric flagyl

## 2014-12-10 ENCOUNTER — Other Ambulatory Visit: Payer: Self-pay | Admitting: Internal Medicine

## 2014-12-10 ENCOUNTER — Other Ambulatory Visit: Payer: Medicare Other

## 2014-12-10 DIAGNOSIS — R1112 Projectile vomiting: Secondary | ICD-10-CM

## 2014-12-10 DIAGNOSIS — R634 Abnormal weight loss: Secondary | ICD-10-CM

## 2014-12-10 DIAGNOSIS — N179 Acute kidney failure, unspecified: Secondary | ICD-10-CM

## 2014-12-10 DIAGNOSIS — R197 Diarrhea, unspecified: Secondary | ICD-10-CM

## 2014-12-10 LAB — PROTEIN ELECTROPHORESIS, SERUM
ALBUMIN ELP: 3.4 g/dL — AB (ref 3.8–4.8)
Alpha-1-Globulin: 0.2 g/dL (ref 0.2–0.3)
Alpha-2-Globulin: 0.7 g/dL (ref 0.5–0.9)
Beta 2: 0.3 g/dL (ref 0.2–0.5)
Beta Globulin: 0.4 g/dL (ref 0.4–0.6)
Gamma Globulin: 0.9 g/dL (ref 0.8–1.7)
TOTAL PROTEIN, SERUM ELECTROPHOR: 5.9 g/dL — AB (ref 6.1–8.1)

## 2014-12-11 LAB — C. DIFFICILE GDH AND TOXIN A/B
C. DIFF TOXIN A/B: NOT DETECTED
C. difficile GDH: NOT DETECTED

## 2014-12-11 LAB — GIARDIA/CRYPTOSPORIDIUM (EIA)
Cryptosporidium Screen (EIA): NEGATIVE
Giardia Screen (EIA): NEGATIVE

## 2014-12-11 LAB — OVA AND PARASITE EXAMINATION: OP: NONE SEEN

## 2014-12-14 LAB — STOOL CULTURE

## 2014-12-25 ENCOUNTER — Encounter: Payer: Self-pay | Admitting: Gastroenterology

## 2015-01-07 ENCOUNTER — Encounter: Payer: Self-pay | Admitting: Internal Medicine

## 2015-01-07 ENCOUNTER — Telehealth: Payer: Self-pay

## 2015-01-07 ENCOUNTER — Other Ambulatory Visit: Payer: Self-pay | Admitting: Internal Medicine

## 2015-01-07 ENCOUNTER — Other Ambulatory Visit (INDEPENDENT_AMBULATORY_CARE_PROVIDER_SITE_OTHER): Payer: Medicare Other

## 2015-01-07 ENCOUNTER — Ambulatory Visit (INDEPENDENT_AMBULATORY_CARE_PROVIDER_SITE_OTHER): Payer: Medicare Other | Admitting: Internal Medicine

## 2015-01-07 VITALS — BP 116/64 | HR 87 | Temp 98.0°F | Ht 66.0 in | Wt 156.0 lb

## 2015-01-07 DIAGNOSIS — N183 Chronic kidney disease, stage 3 unspecified: Secondary | ICD-10-CM

## 2015-01-07 DIAGNOSIS — I10 Essential (primary) hypertension: Secondary | ICD-10-CM

## 2015-01-07 DIAGNOSIS — F329 Major depressive disorder, single episode, unspecified: Secondary | ICD-10-CM | POA: Diagnosis not present

## 2015-01-07 DIAGNOSIS — H9190 Unspecified hearing loss, unspecified ear: Secondary | ICD-10-CM | POA: Insufficient documentation

## 2015-01-07 DIAGNOSIS — F32A Depression, unspecified: Secondary | ICD-10-CM

## 2015-01-07 DIAGNOSIS — N184 Chronic kidney disease, stage 4 (severe): Secondary | ICD-10-CM | POA: Insufficient documentation

## 2015-01-07 DIAGNOSIS — H9193 Unspecified hearing loss, bilateral: Secondary | ICD-10-CM

## 2015-01-07 LAB — BASIC METABOLIC PANEL
BUN: 40 mg/dL — AB (ref 6–23)
CALCIUM: 9.4 mg/dL (ref 8.4–10.5)
CHLORIDE: 103 meq/L (ref 96–112)
CO2: 26 meq/L (ref 19–32)
CREATININE: 1.89 mg/dL — AB (ref 0.40–1.20)
GFR: 32.84 mL/min — AB (ref >60.00)
GLUCOSE: 105 mg/dL — AB (ref 70–99)
Potassium: 4.4 mEq/L (ref 3.5–5.1)
Sodium: 138 mEq/L (ref 135–145)

## 2015-01-07 MED ORDER — DICLOFENAC SODIUM 1 % TD GEL: 4.0000 g | Freq: Four times a day (QID) | TRANSDERMAL | Status: DC

## 2015-01-07 NOTE — Progress Notes (Signed)
Pre visit review using our clinic review tool, if applicable. No additional management support is needed unless otherwise documented below in the visit note. 

## 2015-01-07 NOTE — Assessment & Plan Note (Signed)
With worsening recently   Lab Results  Component Value Date   CREATININE 2.05* 12/06/2014   For f/u today, cont same meds, consider renal referral

## 2015-01-07 NOTE — Patient Instructions (Addendum)
Your ears were irrigated of wax today, but it was not able to be completed, You will be contacted regarding the referral for: ENT  Please continue all other medications as before, and refills have been done if requested.  Please have the pharmacy call with any other refills you may need.  Please continue your efforts at being more active, low cholesterol diet, and weight control.  Please keep your appointments with your specialists as you may have planned  Please go to the LAB in the Basement (turn left off the elevator) for the tests to be done today - just the kidney function today  You will be contacted by phone if any changes need to be made immediately.  Otherwise, you will receive a letter about your results with an explanation, but please check with MyChart first.  Please remember to sign up for MyChart if you have not done so, as this will be important to you in the future with finding out test results, communicating by private email, and scheduling acute appointments online when needed.  Please return in 6 months, or sooner if needed

## 2015-01-07 NOTE — Progress Notes (Addendum)
Subjective:    Patient ID: Michelle Morton, female    DOB: 02-May-1932, 81 y.o.   MRN: [id number]  HPI  Here to f/u , fortunatly diarrhea resolved, c diff and cx neg.  Still has gas and bloating with certain foods, and takes TUMS seems to help.  Appette comes and goes.  Wt has been less last few months.   Wt Readings from Last 3 Encounters:  01/07/15 156 lb (70.761 kg)  12/06/14 158 lb (71.668 kg)  10/09/14 163 lb 1.3 oz (73.973 kg)  Has been under more stress lately as husbnad is divorcing her after 59 yrs.  Pt denies chest pain, increased sob or doe, wheezing, orthopnea, PND, increased LE swelling, palpitations, dizziness or syncope.  Pt denies new neurological symptoms such as new headache, or facial or extremity weakness or numbness  Has had mild worsening depressive symptoms, no suicidal ideation, or panic; has ongoing anxiety, not increased recently.  Does have bilat hearing loss in last few wks - ? Wax again Past Medical History  Diagnosis Date  . Hypertension   . Arthus phenomenon   . Hyperlipidemia   . Diabetes mellitus, type 2   . Hypothyroidism   . Allergic rhinitis, cause unspecified 02/14/2013  . SVD (spontaneous vaginal delivery)     x 4  . Varicose veins     lower legs  . Osteoarthritis, knee     knees - otc med prn  . History of blood transfusion     [name] - unsure number of units transfused  . Hearing loss     right ear, no hearing aid   Past Surgical History  Procedure Laterality Date  . Abdominal hysterectomy    . Replacement total knee      left  knee '06  . Knee arthroplasty      left  . Right knee replacement    . Tonsillectomy    . Wisdom tooth extraction    . Eye surgery      bilateral cataract eye surgery  . Appendectomy    . Joint replacement      right and left knee  . Colonoscopy    . Kidney stone surgery      removal of stone  . Anterior and posterior repair N/A 04/17/2014    Procedure: ANTERIOR (CYSTOCELE) AND POSTERIOR REPAIR  (RECTOCELE);  Surgeon: Lavina Hamman, MD;  Location: [facility];  Service: Gynecology;  Laterality: N/A;    reports that she quit smoking about 22 years ago. Her smoking use included Cigarettes. She has a 16 pack-year smoking history. She has never used smokeless tobacco. She reports that she does not drink alcohol or use illicit drugs. family history includes Cancer in her daughter; Diabetes in her father; Hypertension in her father. Allergies  Allergen Reactions  . Penicillins Rash    Allergic to IV penicillin but tolerates the oral form   Current Outpatient Prescriptions on File Prior to Visit  Medication Sig Dispense Refill  . BIOTIN PO Take 1 tablet by mouth daily.    . cetirizine (ZYRTEC) 10 MG tablet Take 10 mg by mouth daily.    . fluticasone (FLONASE) 50 MCG/ACT nasal spray Place 2 sprays into both nostrils daily. 16 g 5  . glucose blood (ONETOUCH VERIO) test strip Use to check blood sugars twice a day Dx E11.9 100 each 3  . levothyroxine (SYNTHROID, LEVOTHROID) 88 MCG tablet Take 1 tablet (88 mcg total) by mouth daily. 90 tablet 3  .  metFORMIN (GLUCOPHAGE) 850 MG tablet Take 1 tablet (850 mg total) by mouth 2 (two) times daily with a meal. 60 tablet 11  . ONETOUCH DELICA LANCETS 33G MISC Use to check blood sugars twice a day Dx E11.9 100 each 3  . pravastatin (PRAVACHOL) 40 MG tablet TAKE ONE TABLET BY MOUTH ONCE DAILY 30 tablet 5  . traMADol (ULTRAM) 50 MG tablet Take 1 tablet (50 mg total) by mouth every 6 (six) hours as needed. 120 tablet 2  . vitamin C (ASCORBIC ACID) 500 MG tablet Take 500 mg by mouth daily.     No current facility-administered medications on file prior to visit.   Review of Systems  Constitutional: Negative for unusual diaphoresis or night sweats HENT: Negative for ringing in ear or discharge Eyes: Negative for double vision or worsening visual disturbance.  Respiratory: Negative for choking and stridor.   Gastrointestinal: Negative for vomiting or other  signifcant bowel change Genitourinary: Negative for hematuria or change in urine volume.  Musculoskeletal: Negative for other MSK pain or swelling Skin: Negative for color change and worsening wound.  Neurological: Negative for tremors and numbness other than noted  Psychiatric/Behavioral: Negative for decreased concentration or agitation other than above       Objective:   Physical Exam BP 116/64 mmHg  Pulse 87  Temp(Src) 98 F (36.7 C) (Oral)  Ht  (1.676 m)  Wt 156 lb (70.761 kg)  BMI 25.19 kg/m2  SpO2 98% VS noted,  Constitutional: Pt appears in no significant distress HENT: Head: NCAT.  Right Ear: External ear normal.  Left Ear: External ear normal.  Bilat tm's clear/canals clear after irrigation except for left unable Eyes: . Pupils are equal, round, and reactive to light. Conjunctivae and EOM are normal Neck: Normal range of motion. Neck supple.  Cardiovascular: Normal rate and regular rhythm.   Pulmonary/Chest: Effort normal and breath sounds without rales or wheezing.  Abd:  Soft, NT, ND, + BS Neurological: Pt is alert. Not confused , motor grossly intact Skin: Skin is warm. No rash, no LE edema Psychiatric: Pt behavior is normal. No agitation. + depressed affect    Assessment & Plan:

## 2015-01-07 NOTE — Addendum Note (Signed)
Addended by: [name] on: 01/07/2015 10:03 AM   Modules accepted: Orders

## 2015-01-07 NOTE — Telephone Encounter (Signed)
Pa initiated via covermymeds KEY: [id number] Script Diclofenac Sodium

## 2015-01-07 NOTE — Assessment & Plan Note (Signed)
Mild situational over divorce, declines counseling or other tx such as celexa,  to f/u any worsening symptoms or concerns, denies SI or HI

## 2015-01-07 NOTE — Assessment & Plan Note (Addendum)
Due to bilat wax impactions, now improved,/irrigated but not complete, will refer ENT

## 2015-01-07 NOTE — Assessment & Plan Note (Signed)
stable overall by history and exam, recent data reviewed with pt, and pt to continue medical treatment as before,  to f/u any worsening symptoms or concerns BP Readings from Last 3 Encounters:  01/07/15 116/64  12/06/14 112/66  10/09/14 122/88

## 2015-01-08 NOTE — Telephone Encounter (Signed)
[place] approved pharmacy notified 

## 2015-01-10 ENCOUNTER — Telehealth: Payer: Self-pay | Admitting: Internal Medicine

## 2015-01-10 NOTE — Telephone Encounter (Signed)
Can you please give patient a call.  The only thing she would tell me is it was about an appointment.  Maybe about the one yesterday

## 2015-01-14 NOTE — Telephone Encounter (Signed)
Pt was calling regarding ENT referral. She received appt call yesterday

## 2015-01-17 ENCOUNTER — Other Ambulatory Visit: Payer: Self-pay | Admitting: Nephrology

## 2015-01-17 DIAGNOSIS — R7989 Other specified abnormal findings of blood chemistry: Secondary | ICD-10-CM

## 2015-01-22 ENCOUNTER — Ambulatory Visit
Admission: RE | Admit: 2015-01-22 | Discharge: 2015-01-22 | Disposition: A | Discharge status: Home / Self Care | Payer: Medicare Other | Source: Ambulatory Visit | Attending: Nephrology | Admitting: Nephrology

## 2015-01-22 ENCOUNTER — Other Ambulatory Visit: Payer: Medicare Other

## 2015-01-22 DIAGNOSIS — R7989 Other specified abnormal findings of blood chemistry: Secondary | ICD-10-CM

## 2015-01-26 ENCOUNTER — Ambulatory Visit (INDEPENDENT_AMBULATORY_CARE_PROVIDER_SITE_OTHER): Payer: Medicare Other

## 2015-01-26 ENCOUNTER — Other Ambulatory Visit: Payer: Self-pay | Admitting: Family Medicine

## 2015-01-26 ENCOUNTER — Ambulatory Visit (INDEPENDENT_AMBULATORY_CARE_PROVIDER_SITE_OTHER): Payer: Medicare Other | Admitting: Family Medicine

## 2015-01-26 VITALS — BP 118/60 | HR 91 | Temp 97.9°F | Ht 66.0 in | Wt 152.5 lb

## 2015-01-26 DIAGNOSIS — W19XXXA Unspecified fall, initial encounter: Secondary | ICD-10-CM

## 2015-01-26 DIAGNOSIS — M25561 Pain in right knee: Secondary | ICD-10-CM | POA: Diagnosis not present

## 2015-01-26 DIAGNOSIS — M25562 Pain in left knee: Secondary | ICD-10-CM

## 2015-01-26 DIAGNOSIS — M25559 Pain in unspecified hip: Secondary | ICD-10-CM | POA: Diagnosis not present

## 2015-01-26 MED ORDER — HYDROCODONE-ACETAMINOPHEN 5-325 MG PO TABS: 1.0000 | ORAL_TABLET | Freq: Four times a day (QID) | ORAL | Status: DC | PRN

## 2015-01-26 NOTE — Progress Notes (Addendum)
° °  Subjective:    Patient ID: Michelle Morton, female    DOB: 01/31/1932, 81 y.o.   MRN: [id number] This chart was scribed for [name], MD by Littie Deeds, Medical Scribe. This patient was seen in Room 2 and the patient's care was started at 2:30 PM.   HPI HPI Comments: Michelle Morton is a 81 y.o. female who presents to the [facility] complaining of a fall into her bathtub that occurred 2 days ago. She was barefooted and states that she slipped. She did hit the back of her head and had a knot to the back of her head, but this has resolved. Patient reports having right knee pain and pain to the cheeks of her buttocks. She had difficulty moving due to having both of her knees replaced and was stuck in the tub for almost 2 hours. Patient denies LOC, nausea, and bruising. She has a handicap bathtub and was not taking a bath at that time.    Review of Systems  Gastrointestinal: Negative for nausea.  Musculoskeletal: Positive for arthralgias.  Skin: Negative for wound.  Neurological: Negative for syncope.       Objective:   Physical Exam CONSTITUTIONAL: Well developed/well nourished HEAD: Normocephalic/atraumatic EYES: EOM/PERRL ENMT: Mucous membranes moist NECK: supple no meningeal signs SPINE: entire spine nontender CV: S1/S2 noted, no murmurs/rubs/gallops noted LUNGS: Lungs are clear to auscultation bilaterally, no apparent distress ABDOMEN: soft, nontender, no rebound or guarding GU: no cva tenderness NEURO: Pt is awake/alert, moves all extremitiesx4 EXTREMITIES: pulses normal, full ROM; she is able to cross her legs. She has some mild swelling of the right knee with tenderness on the lateral joint line. She has bilateral total knees replacement scars from surgery. There is no ecchymosis. She has 1+ bilateral tibial edema SKIN: warm, color normal PSYCH: no abnormalities of mood noted   [facility] reading (PRIMARY) by  Dr. Milus Glazier:  Pelvis and right knee.   negative       Assessment & Plan:   This chart was scribed in my presence and reviewed by me personally.    ICD-9-CM ICD-10-CM   1. Fall, initial encounter [phone] W19.XXXA DG Pelvis 1-2 Views     HYDROcodone-acetaminophen (NORCO) 5-325 MG per tablet     CANCELED: DG Knee Complete 4 Views Left  2. Knee pain, right 719.46 M25.561 HYDROcodone-acetaminophen (NORCO) 5-325 MG per tablet     CANCELED: DG Knee Complete 4 Views Left  3. Pain in joint, pelvic region and thigh, unspecified laterality 719.45 M25.559 HYDROcodone-acetaminophen (NORCO) 5-325 MG per tablet     Signed, [name], MD

## 2015-01-26 NOTE — Patient Instructions (Signed)
The x-rays are normal regarding the bones. The total knee looks like there is no problem with the prosthesis. This may take a week to 7 days completely heal.

## 2015-02-20 ENCOUNTER — Ambulatory Visit (INDEPENDENT_AMBULATORY_CARE_PROVIDER_SITE_OTHER): Payer: Medicare Other | Admitting: Gastroenterology

## 2015-02-20 ENCOUNTER — Ambulatory Visit: Payer: Medicare Other | Admitting: Internal Medicine

## 2015-02-20 ENCOUNTER — Encounter: Payer: Self-pay | Admitting: Gastroenterology

## 2015-02-20 VITALS — BP 112/60 | HR 88 | Ht 66.0 in | Wt 155.4 lb

## 2015-02-20 DIAGNOSIS — Z1211 Encounter for screening for malignant neoplasm of colon: Secondary | ICD-10-CM | POA: Diagnosis not present

## 2015-02-20 DIAGNOSIS — R197 Diarrhea, unspecified: Secondary | ICD-10-CM | POA: Diagnosis not present

## 2015-02-20 NOTE — Progress Notes (Signed)
HPI :  81 y/o female, here to discuss CRC screening and recent complaints of diarrhea/vomiting/weight loss, in consultation from Dr. Oliver Barre.   Patient reports she had new onset of dark green, loose stools, and associated with vomiting and weight loss. She had this for 2 weeks in mid July. She reported having bowel movements with each meal and upwards of 5 BMs per day. She had sporadic postprandial vomiting a few times during this 2 weeks. She lost 10 lbs in 2 weeks. She had an infectious workup that was negative and without a clear etiology. She thinks about after 2 weeks, her symptoms for the most part resolved. Her vomiting stopped and she is eating well. She reports her diarrhea resolved. She is having about one BM per day or so. She thinks she has gained 3 lbs back. No blood in the stools. She reports she did not take any antibiotics or other regimens to make it go away. No new medications at the time of onset of symptoms. Last colonoscopy was in 2004 which was normal.   No history of CAD or CVA. No DOE or chest pains. No shortness of breath. No CRC in the family.    Past Medical History  Diagnosis Date  . Hypertension   . Arthus phenomenon   . Hyperlipidemia   . Diabetes mellitus, type 2   . Hypothyroidism   . Allergic rhinitis, cause unspecified 02/14/2013  . SVD (spontaneous vaginal delivery)     x 4  . Varicose veins     lower legs  . Osteoarthritis, knee     knees - otc med prn  . History of blood transfusion     [name] - unsure number of units transfused  . Hearing loss     right ear, no hearing aid  . Arthritis   . Kidney stones      Past Surgical History  Procedure Laterality Date  . Abdominal hysterectomy    . Replacement total knee Bilateral   . Tonsillectomy    . Wisdom tooth extraction    . Cataract extraction Bilateral   . Appendectomy    . Colonoscopy    . Kidney stone surgery      removal of stone  . Anterior and posterior repair N/A 04/17/2014     Procedure: ANTERIOR (CYSTOCELE) AND POSTERIOR REPAIR (RECTOCELE);  Surgeon: Lavina Hamman, MD;  Location: [facility];  Service: Gynecology;  Laterality: N/A;   Family History  Problem Relation Age of Onset  . Diabetes Father   . Hypertension Father   . Breast cancer Daughter   . Diabetes Mother   . Diabetes Brother     x 1  . Diabetes Sister     x 4  . Colon cancer Neg Hx   . Esophageal cancer Neg Hx   . Pancreatic cancer Neg Hx   . Liver disease Neg Hx   . Kidney disease Neg Hx    Social History  Substance Use Topics  . Smoking status: Former Smoker -- 1.00 packs/day for 16 years    Types: Cigarettes    Quit date: 05/10/1992  . Smokeless tobacco: Never Used  . Alcohol Use: No   Current Outpatient Prescriptions  Medication Sig Dispense Refill  . BIOTIN PO Take 1 tablet by mouth daily.    . cetirizine (ZYRTEC) 10 MG tablet Take 10 mg by mouth daily.    . diclofenac sodium (VOLTAREN) 1 % GEL Apply 4 g topically 4 (four) times daily.  GENERIC OK 400 g 11  . fluticasone (FLONASE) 50 MCG/ACT nasal spray Place 2 sprays into both nostrils daily. 16 g 5  . glucose blood (ONETOUCH VERIO) test strip Use to check blood sugars twice a day Dx E11.9 100 each 3  . hydrochlorothiazide (HYDRODIURIL) 25 MG tablet Take 25 mg by mouth daily.    Marland Kitchen levothyroxine (SYNTHROID, LEVOTHROID) 88 MCG tablet Take 1 tablet (88 mcg total) by mouth daily. 90 tablet 3  . metFORMIN (GLUCOPHAGE) 850 MG tablet Take 1 tablet (850 mg total) by mouth 2 (two) times daily with a meal. 60 tablet 11  . ONETOUCH DELICA LANCETS 33G MISC Use to check blood sugars twice a day Dx E11.9 100 each 3  . pravastatin (PRAVACHOL) 40 MG tablet TAKE ONE TABLET BY MOUTH ONCE DAILY 30 tablet 5  . traMADol (ULTRAM) 50 MG tablet Take 1 tablet (50 mg total) by mouth every 6 (six) hours as needed. 120 tablet 2  . vitamin C (ASCORBIC ACID) 500 MG tablet Take 500 mg by mouth daily.     No current facility-administered medications for this  visit.   Allergies  Allergen Reactions  . Penicillins Rash    Allergic to IV penicillin but tolerates the oral form     Review of Systems: All systems reviewed and negative except where noted in HPI.   Lab reviewed show negative stool for C Diff, ova/parasites, giardia. CBC with normal Hgb and WBC. Normal LFTs   Physical Exam: BP 112/60 mmHg  Pulse 88  Ht  (1.676 m)  Wt 155 lb 6.4 oz (70.489 kg)  BMI 25.09 kg/m2 Constitutional: Pleasant,well-developed, African American female in no acute distress. HEENT: Normocephalic and atraumatic. Conjunctivae are normal. No scleral icterus. Neck supple.  Cardiovascular: Normal rate, regular rhythm.  Pulmonary/chest: Effort normal and breath sounds normal. No wheezing, rales or rhonchi. Abdominal: Soft, nondistended, nontender. Bowel sounds active throughout. There are no masses palpable. No hepatomegaly. Extremities: trace edema B Lymphadenopathy: No cervical adenopathy noted. Neurological: Alert and oriented to person place and time. Skin: Skin is warm and dry. No rashes noted. Psychiatric: Normal mood and affect. Behavior is normal.   ASSESSMENT AND PLAN: 80 y/o female seen in consultation for symptoms of diarrhea / vomiting associated with 10 lbs weight loss that occurred in July. History as above, mid June had 2 weeks of diarrhea with poor PO intake and vomiting. Infectious workup negative. CBC showed normal Hgb and WBC. Since that time her symptoms have resolved, and she has regained some weight. Currently has regular stools and eating well without restrictions. She was not on any medications she endorses which could have caused this. Unclear etiology but suspect she had an infectious gastroenteritis which she took some time to recover from. In regards to her symptoms, given they have resolved and she feels at baseline without alarm symptoms, I don't think we need to proceed with further workup at this time given the initial workup  she has had done. If she has recurrence of symptoms in the future, however, I asked her to let me know and if diarrhea is persistent offered her a colonoscopy. She agreed.   Otherwise we discussed CRC screening. She has no history of colon polyps on a few colonoscopies historically, no FH of CRC, no anemia, and currently no alarm symptoms. I think it is unlikely she has colon cancer or an advanced adenoma at this time however we discussed screening guidelines, with most advocate stopping screening in a range between  age 23 to 2 or so. At age 44 I offered her CRC screening and discussed modalities to include colonoscopy and stool testing. However, following this discussion she wished to decline CRC screening and did not want to go through a colonoscopy unless she was having symptoms prompting her to seek evaluation. As above, if her diarrhea recurs or if it was persistent she would agree to proceed but given she is asymptomatic at this time she wishes to hold off for now. She can follow up as needed.  Ileene Patrick, MD  Gastroenterology Pager [phone]    CC: Dr. Oliver Barre

## 2015-02-20 NOTE — Patient Instructions (Signed)
Thank you for choosing [facility] to care for you.  

## 2015-03-05 ENCOUNTER — Telehealth: Payer: Self-pay | Admitting: Gastroenterology

## 2015-03-05 NOTE — Telephone Encounter (Signed)
Received medical records from [facility]. Sent to Dr. Adela Lank. 03/05/15/ss

## 2015-03-10 ENCOUNTER — Other Ambulatory Visit: Payer: Self-pay | Admitting: Internal Medicine

## 2015-03-10 NOTE — Telephone Encounter (Signed)
Pt called to check up on this request because she is out of this medicine and pt stated that the pharmacy try to contact us couple times but never heard from [place]. Please call pt once we send it in.

## 2015-03-11 ENCOUNTER — Other Ambulatory Visit: Payer: Self-pay | Admitting: Internal Medicine

## 2015-03-11 DIAGNOSIS — N183 Chronic kidney disease, stage 3 unspecified: Secondary | ICD-10-CM

## 2015-03-11 DIAGNOSIS — I10 Essential (primary) hypertension: Secondary | ICD-10-CM

## 2015-03-12 ENCOUNTER — Ambulatory Visit (HOSPITAL_COMMUNITY)
Admission: RE | Admit: 2015-03-12 | Discharge: 2015-03-12 | Disposition: A | Discharge status: Home / Self Care | Payer: Medicare Other | Source: Ambulatory Visit | Attending: Urology | Admitting: Urology

## 2015-03-12 DIAGNOSIS — I10 Essential (primary) hypertension: Secondary | ICD-10-CM

## 2015-03-12 DIAGNOSIS — N183 Chronic kidney disease, stage 3 unspecified: Secondary | ICD-10-CM

## 2015-03-12 DIAGNOSIS — I129 Hypertensive chronic kidney disease with stage 1 through stage 4 chronic kidney disease, or unspecified chronic kidney disease: Secondary | ICD-10-CM | POA: Insufficient documentation

## 2015-03-12 DIAGNOSIS — E785 Hyperlipidemia, unspecified: Secondary | ICD-10-CM | POA: Insufficient documentation

## 2015-03-12 DIAGNOSIS — E119 Type 2 diabetes mellitus without complications: Secondary | ICD-10-CM | POA: Diagnosis not present

## 2015-03-12 DIAGNOSIS — I700 Atherosclerosis of aorta: Secondary | ICD-10-CM | POA: Diagnosis not present

## 2015-03-19 ENCOUNTER — Emergency Department (HOSPITAL_COMMUNITY)
Admission: EM | Admit: 2015-03-19 | Discharge: 2015-03-20 | Disposition: A | Discharge status: Home / Self Care | Payer: Medicare Other | Attending: Emergency Medicine | Admitting: Emergency Medicine

## 2015-03-19 ENCOUNTER — Encounter (HOSPITAL_COMMUNITY): Payer: Self-pay | Admitting: Emergency Medicine

## 2015-03-19 ENCOUNTER — Emergency Department (HOSPITAL_COMMUNITY): Payer: Medicare Other

## 2015-03-19 DIAGNOSIS — K219 Gastro-esophageal reflux disease without esophagitis: Secondary | ICD-10-CM | POA: Diagnosis not present

## 2015-03-19 DIAGNOSIS — E119 Type 2 diabetes mellitus without complications: Secondary | ICD-10-CM | POA: Diagnosis not present

## 2015-03-19 DIAGNOSIS — R531 Weakness: Secondary | ICD-10-CM | POA: Insufficient documentation

## 2015-03-19 DIAGNOSIS — H9191 Unspecified hearing loss, right ear: Secondary | ICD-10-CM | POA: Diagnosis not present

## 2015-03-19 DIAGNOSIS — Z7951 Long term (current) use of inhaled steroids: Secondary | ICD-10-CM | POA: Diagnosis not present

## 2015-03-19 DIAGNOSIS — R634 Abnormal weight loss: Secondary | ICD-10-CM | POA: Insufficient documentation

## 2015-03-19 DIAGNOSIS — M1990 Unspecified osteoarthritis, unspecified site: Secondary | ICD-10-CM | POA: Insufficient documentation

## 2015-03-19 DIAGNOSIS — E039 Hypothyroidism, unspecified: Secondary | ICD-10-CM | POA: Insufficient documentation

## 2015-03-19 DIAGNOSIS — E785 Hyperlipidemia, unspecified: Secondary | ICD-10-CM | POA: Diagnosis not present

## 2015-03-19 DIAGNOSIS — Z87891 Personal history of nicotine dependence: Secondary | ICD-10-CM | POA: Diagnosis not present

## 2015-03-19 DIAGNOSIS — R079 Chest pain, unspecified: Secondary | ICD-10-CM | POA: Diagnosis present

## 2015-03-19 DIAGNOSIS — R0789 Other chest pain: Secondary | ICD-10-CM | POA: Diagnosis not present

## 2015-03-19 DIAGNOSIS — R5383 Other fatigue: Secondary | ICD-10-CM | POA: Insufficient documentation

## 2015-03-19 DIAGNOSIS — I10 Essential (primary) hypertension: Secondary | ICD-10-CM | POA: Insufficient documentation

## 2015-03-19 DIAGNOSIS — Z880 Allergy status to penicillin: Secondary | ICD-10-CM | POA: Diagnosis not present

## 2015-03-19 DIAGNOSIS — Z8679 Personal history of other diseases of the circulatory system: Secondary | ICD-10-CM | POA: Diagnosis not present

## 2015-03-19 DIAGNOSIS — Z87442 Personal history of urinary calculi: Secondary | ICD-10-CM | POA: Diagnosis not present

## 2015-03-19 DIAGNOSIS — Z79899 Other long term (current) drug therapy: Secondary | ICD-10-CM | POA: Diagnosis not present

## 2015-03-19 DIAGNOSIS — R0602 Shortness of breath: Secondary | ICD-10-CM | POA: Diagnosis not present

## 2015-03-19 LAB — CBC
HCT: 38.4 % (ref 36.0–46.0)
Hemoglobin: 12.7 g/dL (ref 12.0–15.0)
MCH: 30.8 pg (ref 26.0–34.0)
MCHC: 33.1 g/dL (ref 30.0–36.0)
MCV: 93 fL (ref 78.0–100.0)
Platelets: 315 10*3/uL (ref 150–400)
RBC: 4.13 MIL/uL (ref 3.87–5.11)
RDW: 13.2 % (ref 11.5–15.5)
WBC: 7.6 10*3/uL (ref 4.0–10.5)

## 2015-03-19 LAB — BASIC METABOLIC PANEL
ANION GAP: 11 (ref 5–15)
BUN: 57 mg/dL — ABNORMAL HIGH (ref 6–20)
CALCIUM: 9.8 mg/dL (ref 8.9–10.3)
CO2: 26 mmol/L (ref 22–32)
Chloride: 101 mmol/L (ref 101–111)
Creatinine, Ser: 2.12 mg/dL — ABNORMAL HIGH (ref 0.44–1.00)
GFR, EST AFRICAN AMERICAN: 24 mL/min — AB (ref >60)
GFR, EST NON AFRICAN AMERICAN: 21 mL/min — AB (ref >60)
Glucose, Bld: 120 mg/dL — ABNORMAL HIGH (ref 65–99)
Potassium: 4.6 mmol/L (ref 3.5–5.1)
Sodium: 138 mmol/L (ref 135–145)

## 2015-03-19 LAB — I-STAT TROPONIN, ED: TROPONIN I, POC: 0.02 ng/mL (ref 0.00–0.08)

## 2015-03-19 NOTE — ED Notes (Signed)
Pt was diagnosed with stage 3 kidney disease about 3 months ago. Pt states she had a pain in her central chest area. Pt states she has been feeling weak for a few days. Pt states when she stood up quickly she felt light headed, but it dissipated. Pt denies pain at this time. Pt has normal heart and lung sounds.

## 2015-03-19 NOTE — ED Notes (Signed)
EDP at bedside  

## 2015-03-19 NOTE — ED Provider Notes (Signed)
CSN: [id number]     Arrival date & time 03/19/15  2005 History  By signing my name below, I, Budd Palmer, attest that this documentation has been prepared under the direction and in the presence of Devoria Albe, MD [phone]. Electronically Signed: Budd Palmer, ED Scribe. 03/19/2015. 11:59 PM.    Chief Complaint  Patient presents with  . Chest Pain  . Weakness   The history is provided by the patient and a relative. No language interpreter was used.   HPI Comments: Michelle Morton is a 81 y.o. female  With a PMHx of HTN, DM, and HLD who presents to the Emergency Department complaining of intermittent central chest pain and weakness onset yesterday. She notes her last episode of CP occurred 10 hours ago. Pt states she had done a lot of work around the house doing laundry and putting it away. She notes she gets dizzy when she turns around quickly. Today she notes she was having trouble with eating chicken noodle soup around 1:30 pm, causing CP, but was able to eat crackers (10 hours ago). She states she has chest pain in her mid central chest that started when she was eating the soup. She states she only gets the chest pain if she breathes deep. The chest pain is not constant. It is very brief. The last time she had chest pain was at 2 PM. She reports associated fatigue, hoarse voice, feeling of SOB (resolved with sitting down), rapid weight loss (40 lbs since August that her doctors are investigating), loss of appetite, and epigastric pain (resolved with taking a Tum's or burping, worsened with eating). She notes she was unable to get up after sitting due to weakness and lightheadedness, and would have to rest for a time before being able to stand. She notes exacerbation of the CP with taking a deep breath and exercising. She states that her mouth also feels dry. She notes she has not taken her fluid pills today, but states her legs are not swollen. She was taken off HCTZ and put on lasix recently for her leg  swelling which is working well. She states she is not on any medication for her indigestion. She notes she lives alone and does her own house work. She reports a PMHx of stage 3 kidney disease. She denies smoking.  PCP Dr Jonny Ruiz  Past Medical History  Diagnosis Date  . Hypertension   . Arthus phenomenon   . Hyperlipidemia   . Diabetes mellitus, type 2 (HCC)   . Hypothyroidism   . Allergic rhinitis, cause unspecified 02/14/2013  . SVD (spontaneous vaginal delivery)     x 4  . Varicose veins     lower legs  . Osteoarthritis, knee     knees - otc med prn  . History of blood transfusion     [name] - unsure number of units transfused  . Hearing loss     right ear, no hearing aid  . Arthritis   . Kidney stones    Past Surgical History  Procedure Laterality Date  . Abdominal hysterectomy    . Replacement total knee Bilateral   . Tonsillectomy    . Wisdom tooth extraction    . Cataract extraction Bilateral   . Appendectomy    . Colonoscopy    . Kidney stone surgery      removal of stone  . Anterior and posterior repair N/A 04/17/2014    Procedure: ANTERIOR (CYSTOCELE) AND POSTERIOR REPAIR (RECTOCELE);  Surgeon: Tawanna Cooler  Meisinger, MD;  Location: [facility];  Service: Gynecology;  Laterality: N/A;   Family History  Problem Relation Age of Onset  . Diabetes Father   . Hypertension Father   . Breast cancer Daughter   . Diabetes Mother   . Diabetes Brother     x 1  . Diabetes Sister     x 4  . Colon cancer Neg Hx   . Esophageal cancer Neg Hx   . Pancreatic cancer Neg Hx   . Liver disease Neg Hx   . Kidney disease Neg Hx    Social History  Substance Use Topics  . Smoking status: Former Smoker -- 1.00 packs/day for 16 years    Types: Cigarettes    Quit date: 05/10/1992  . Smokeless tobacco: Never Used  . Alcohol Use: No   Lives at home Lives alone  OB History    No data available     Review of Systems  Constitutional: Positive for appetite change, fatigue and  unexpected weight change.  HENT: Positive for trouble swallowing and voice change.   Respiratory: Positive for shortness of breath.   Cardiovascular: Positive for chest pain. Negative for leg swelling.  Gastrointestinal: Positive for abdominal pain.  Neurological: Positive for dizziness, weakness and light-headedness.  All other systems reviewed and are negative.   Allergies  Penicillins  Home Medications   Prior to Admission medications   Medication Sig Start Date End Date Taking? Authorizing Provider  BIOTIN PO Take 1 tablet by mouth daily.   Yes Historical Provider, MD  cetirizine (ZYRTEC) 10 MG tablet Take 10 mg by mouth daily.   Yes Historical Provider, MD  Cyanocobalamin (B-12 PO) Take 1 tablet by mouth daily.   Yes Historical Provider, MD  diclofenac sodium (VOLTAREN) 1 % GEL Apply 4 g topically 4 (four) times daily. GENERIC OK Patient taking differently: Apply 4 g topically 4 (four) times daily as needed (knee pain). GENERIC OK 01/07/15  Yes [name], MD  fluticasone [facility]) 50 MCG/ACT nasal spray Place 2 sprays into both nostrils daily. 05/30/14  Yes [name], MD  furosemide (LASIX) 40 MG tablet Take 40 mg by mouth daily.   Yes Historical Provider, MD  levothyroxine (SYNTHROID, LEVOTHROID) 88 MCG tablet Take 1 tablet (88 mcg total) by mouth daily. 05/30/14  Yes [name], MD  metFORMIN (GLUCOPHAGE) 850 MG tablet Take 1 tablet (850 mg total) by mouth 2 (two) times daily with a meal. 05/01/14  Yes [name], MD  pravastatin (PRAVACHOL) 40 MG tablet TAKE ONE TABLET BY MOUTH ONCE DAILY 03/11/15  Yes [name], MD  traMADol (ULTRAM) 50 MG tablet Take 1 tablet (50 mg total) by mouth every 6 (six) hours as needed. Patient taking differently: Take 50 mg by mouth every 6 (six) hours as needed for moderate pain.  10/09/14  Yes [name], MD  omeprazole (PRILOSEC) 20 MG capsule Take 1 capsule (20 mg total) by mouth daily. 03/20/15   Devoria Albe, MD    ED Triage Vitals   Enc Vitals Group     BP 03/19/15 2016 185/70 mmHg     Pulse Rate 03/19/15 2016 93     Resp 03/19/15 2016 18     Temp 03/19/15 2016 97.8 F (36.6 C)     Temp Source 03/19/15 2016 Oral     SpO2 03/19/15 2016 98 %     Weight --      Height --  Head Cir --      Peak Flow --      Pain Score 03/19/15 2016 2     Pain Loc --      Pain Edu? --      Excl. in GC? --    Vital signs normal    Physical Exam  Constitutional: She is oriented to person, place, and time. She appears well-developed and well-nourished.  Non-toxic appearance. She does not appear ill. No distress.  HENT:  Head: Normocephalic and atraumatic.  Right Ear: External ear normal.  Left Ear: External ear normal.  Nose: Nose normal. No mucosal edema or rhinorrhea.  Mouth/Throat: Oropharynx is clear and moist and mucous membranes are normal. No dental abscesses or uvula swelling.  Eyes: Conjunctivae and EOM are normal. Pupils are equal, round, and reactive to light.  Neck: Normal range of motion and full passive range of motion without pain. Neck supple.  Cardiovascular: Normal rate, regular rhythm and normal heart sounds.  Exam reveals no gallop and no friction rub.   No murmur heard. Pulmonary/Chest: Effort normal and breath sounds normal. No respiratory distress. She has no wheezes. She has no rhonchi. She has no rales. She exhibits tenderness. She exhibits no crepitus.    L lower CCJ was TTP  Abdominal: Soft. Normal appearance and bowel sounds are normal. She exhibits no distension. There is no tenderness. There is no rebound and no guarding.  Musculoskeletal: Normal range of motion. She exhibits no edema or tenderness.  Moves all extremities well. No edema  Neurological: She is alert and oriented to person, place, and time. She has normal strength. No cranial nerve deficit.  Skin: Skin is warm, dry and intact. No rash noted. No erythema. No pallor.  Psychiatric: She has a normal mood and affect. Her speech is  normal and behavior is normal. Her mood appears not anxious.  Nursing note and vitals reviewed.   ED Course  Procedures   Medications  dexamethasone (DECADRON) injection 10 mg (not administered)    DIAGNOSTIC STUDIES: Oxygen Saturation is 100% on RA, normal by my interpretation.    COORDINATION OF CARE: 11:58 PM - Discussed lab results. Discussed plans to order a steroid shot for chest wall pain. Patient has what appears to be chest wall pain however due to her renal insufficiency she cannot be prescribed nonsteroidal anti-inflammatory drugs. She will get Decadron which should last for a couple of days. Pt advised of plan for treatment and pt agrees. She was advised the Decadron could make her have more swelling however she has Lasix she can take if needed. We also discussed she's having GERD symptoms with a feeling of indigestion after eating and may need to burp. We will start her on a PPI.  Her last episode of chest pain was at 2 PM. Therefore delta troponin was not done.  Labs Review Results for orders placed or performed during the hospital encounter of 03/19/15  Basic metabolic panel  Result Value Ref Range   Sodium 138 135 - 145 mmol/L   Potassium 4.6 3.5 - 5.1 mmol/L   Chloride 101 101 - 111 mmol/L   CO2 26 22 - 32 mmol/L   Glucose, Bld 120 (H) 65 - 99 mg/dL   BUN 57 (H) 6 - 20 mg/dL   Creatinine, Ser 5.62 (H) 0.44 - 1.00 mg/dL   Calcium 9.8 8.9 - 13.0 mg/dL   GFR calc non Af Amer 21 (L) >60 mL/min   GFR calc Af Amer 24 (L) >  60 mL/min   Anion gap 11 5 - 15  CBC  Result Value Ref Range   WBC 7.6 4.0 - 10.5 K/uL   RBC 4.13 3.87 - 5.11 MIL/uL   Hemoglobin 12.7 12.0 - 15.0 g/dL   HCT 91.438.4 78.236.0 - 95.646.0 %   MCV 93.0 78.0 - 100.0 fL   MCH 30.8 26.0 - 34.0 pg   MCHC 33.1 30.0 - 36.0 g/dL   RDW 21.313.2 08.611.5 - 57.815.5 %   Platelets 315 150 - 400 K/uL  I-stat troponin, ED  Result Value Ref Range   Troponin i, poc 0.02 0.00 - 0.08 ng/mL   Comment 3            Laboratory  interpretation all normal except stable renal insufficiency    Imaging Review Dg Chest 2 View  03/19/2015  CLINICAL DATA:  Chest pain for 2 days after lifting a basket of laundry, initial encounter. EXAM: CHEST  2 VIEW COMPARISON:  12/06/2014. FINDINGS: Trachea is midline. Heart size normal. Thoracic aorta is calcified. Lungs are clear. No pleural fluid. IMPRESSION: No acute findings. Electronically Signed   By: Leanna BattlesMelinda  Blietz M.D.   On: 03/19/2015 20:57   I have personally reviewed and evaluated these images and lab results as part of my medical decision-making.   EKG Interpretation   Date/Time:  Wednesday March 19 2015 20:24:36 EDT Ventricular Rate:  90 PR Interval:  162 QRS Duration: 74 QT Interval:  337 QTC Calculation: 412 R Axis:   1 Text Interpretation:  Sinus rhythm Abnormal R-wave progression, early  transition LVH with secondary repolarization abnormality No significant  change since last tracing Confirmed by Deondrea Aguado  MD-J, JON ([id number]) on  03/19/2015 8:39:44 PM      MDM   Final diagnoses:  Atypical chest pain  Gastroesophageal reflux disease without esophagitis  Weight loss   New Prescriptions   OMEPRAZOLE (PRILOSEC) 20 MG CAPSULE    Take 1 capsule (20 mg total) by mouth daily.    Plan discharge  Devoria AlbeIva Darthula Desa, MD, FACEP   I personally performed the services described in this documentation, which was scribed in my presence. The recorded information has been reviewed and considered.  Devoria AlbeIva Lakasha Mcfall, MD, Concha PyoFACEP   Cort Dragoo, MD 03/20/15 [phone]

## 2015-03-19 NOTE — ED Notes (Addendum)
Pt dizzy with waking, did not get better after eating, began having left sided chest pain that went away after taking tums. States she feels a pulling to left chest with deep inspiration. Family states pt has been feeling weak over the last week. Denies SOB, N/V, states she did have some diarrhea today. Pt states increased swelling to feet/ankles that she takes lasix for

## 2015-03-20 MED ORDER — DEXAMETHASONE SODIUM PHOSPHATE 10 MG/ML IJ SOLN
10.0000 mg | Freq: Once | INTRAMUSCULAR | Status: AC
  Administered 2015-03-20: 10 mg via INTRAMUSCULAR
  Filled 2015-03-20: qty 1

## 2015-03-20 MED ORDER — OMEPRAZOLE 20 MG PO CPDR: 20.0000 mg | DELAYED_RELEASE_CAPSULE | Freq: Every day | ORAL | Status: DC

## 2015-03-20 NOTE — Discharge Instructions (Signed)
Recheck if you get chest pain that is constant for more than 20-30 minutes or if you have struggling to breathe, nausea, or feel worse. Try taking the Prilosec once a day to see if that helps your indigestion symptoms. Follow-up with your doctor to further investigate why you are having loss of appetite.

## 2015-03-25 ENCOUNTER — Telehealth: Payer: Self-pay | Admitting: Gastroenterology

## 2015-03-25 NOTE — Telephone Encounter (Signed)
She is still having her diarrhea symptoms and weight loss. She wants to go forward with a colonoscopy. Is it okay to schedule her in the [facility]?

## 2015-03-26 NOTE — Telephone Encounter (Signed)
Yes, if her symptoms have recurred and have persisted, based on my prior discussion with her about the exam, she can proceed with scheduling if she wishes to. If she has any other symptoms or concerns otherwise please let me know. Thanks

## 2015-03-26 NOTE — Telephone Encounter (Signed)
Unable to reach patient. Will try again later. 

## 2015-03-27 ENCOUNTER — Encounter: Payer: Self-pay | Admitting: Internal Medicine

## 2015-03-27 ENCOUNTER — Ambulatory Visit (INDEPENDENT_AMBULATORY_CARE_PROVIDER_SITE_OTHER): Payer: Medicare Other | Admitting: Internal Medicine

## 2015-03-27 VITALS — BP 144/76 | HR 92 | Temp 98.3°F | Ht 66.0 in | Wt 146.0 lb

## 2015-03-27 DIAGNOSIS — R197 Diarrhea, unspecified: Secondary | ICD-10-CM

## 2015-03-27 DIAGNOSIS — Z23 Encounter for immunization: Secondary | ICD-10-CM | POA: Diagnosis not present

## 2015-03-27 DIAGNOSIS — R490 Dysphonia: Secondary | ICD-10-CM | POA: Diagnosis not present

## 2015-03-27 DIAGNOSIS — R079 Chest pain, unspecified: Secondary | ICD-10-CM | POA: Diagnosis not present

## 2015-03-27 DIAGNOSIS — R634 Abnormal weight loss: Secondary | ICD-10-CM

## 2015-03-27 NOTE — Patient Instructions (Signed)
Please continue all other medications as before, and refills have been done if requested.  Please have the pharmacy call with any other refills you may need.  Please continue your efforts at being more active, low cholesterol diet, and weight control.  You are otherwise up to date with prevention measures today.  Please keep your appointments with your specialists as you may have planned  You will be contacted regarding the referral for: stress test, Gastroenterology, and ENT

## 2015-03-27 NOTE — Progress Notes (Signed)
Subjective:    Patient ID: Michelle Morton, female    DOB: 02/26/1932, 81 y.o.   MRN: [id number]  HPI  Here to f/u; overall doing ok,  Pt denies worsening chest pain, increasing sob or doe, wheezing, orthopnea, PND, increased LE swelling, palpitations, dizziness or syncope.  Pt denies new neurological symptoms such as new headache, or facial or extremity weakness or numbness.  Pt denies polydipsia, polyuria, or low sugar episode.   Pt denies new neurological symptoms such as new headache, or facial or extremity weakness or numbness.   Pt states overall good compliance with meds, mostly trying to follow appropriate diet, but wt overall downanother 9 lbs, reason unclear, still with intermittent diarrhea, as well as ongoing hoarseness for 2 yrs. Wt Readings from Last 3 Encounters:  03/27/15 146 lb (66.225 kg)  02/20/15 155 lb 6.4 oz (70.489 kg)  01/26/15 152 lb 8 oz (69.174 kg)   Past Medical History  Diagnosis Date  . Hypertension   . Arthus phenomenon   . Hyperlipidemia   . Diabetes mellitus, type 2 (HCC)   . Hypothyroidism   . Allergic rhinitis, cause unspecified 02/14/2013  . SVD (spontaneous vaginal delivery)     x 4  . Varicose veins     lower legs  . Osteoarthritis, knee     knees - otc med prn  . History of blood transfusion     [name] - unsure number of units transfused  . Hearing loss     right ear, no hearing aid  . Arthritis   . Kidney stones    Past Surgical History  Procedure Laterality Date  . Abdominal hysterectomy    . Replacement total knee Bilateral   . Tonsillectomy    . Wisdom tooth extraction    . Cataract extraction Bilateral   . Appendectomy    . Colonoscopy    . Kidney stone surgery      removal of stone  . Anterior and posterior repair N/A 04/17/2014    Procedure: ANTERIOR (CYSTOCELE) AND POSTERIOR REPAIR (RECTOCELE);  Surgeon: Lavina Hamman, MD;  Location: [facility];  Service: Gynecology;  Laterality: N/A;    reports that she quit smoking about 22  years ago. Her smoking use included Cigarettes. She has a 16 pack-year smoking history. She has never used smokeless tobacco. She reports that she does not drink alcohol or use illicit drugs. family history includes Breast cancer in her daughter; Diabetes in her brother, father, mother, and sister; Hypertension in her father. There is no history of Colon cancer, Esophageal cancer, Pancreatic cancer, Liver disease, or Kidney disease. Allergies  Allergen Reactions  . Penicillins Rash    Allergic to IV penicillin but tolerates the oral form Has patient had a PCN reaction causing immediate rash, facial/tongue/throat swelling, SOB or lightheadedness with hypotension: no Has patient had a PCN reaction causing severe rash involving mucus membranes or skin necrosis: no Has patient had a PCN reaction that required hospitalization already in the hospital for other procedure  Has patient had a PCN reaction occurring within the last 10 years: no If all of the above answers are "NO", then ma   Current Outpatient Prescriptions on File Prior to Visit  Medication Sig Dispense Refill  . BIOTIN PO Take 1 tablet by mouth daily.    . cetirizine (ZYRTEC) 10 MG tablet Take 10 mg by mouth daily.    . Cyanocobalamin (B-12 PO) Take 1 tablet by mouth daily.    . diclofenac sodium (  VOLTAREN) 1 % GEL Apply 4 g topically 4 (four) times daily. GENERIC OK (Patient taking differently: Apply 4 g topically 4 (four) times daily as needed (knee pain). GENERIC OK) 400 g 11  . fluticasone (FLONASE) 50 MCG/ACT nasal spray Place 2 sprays into both nostrils daily. 16 g 5  . furosemide (LASIX) 40 MG tablet Take 40 mg by mouth daily.    . levothyroxMarland Kitchenine (SYNTHROID, LEVOTHROID) 88 MCG tablet Take 1 tablet (88 mcg total) by mouth daily. 90 tablet 3  . metFORMIN (GLUCOPHAGE) 850 MG tablet Take 1 tablet (850 mg total) by mouth 2 (two) times daily with a meal. 60 tablet 11  . omeprazole (PRILOSEC) 20 MG capsule Take 1 capsule (20 mg total) by  mouth daily. 30 capsule 0  . pravastatin (PRAVACHOL) 40 MG tablet TAKE ONE TABLET BY MOUTH ONCE DAILY 30 tablet 5  . traMADol (ULTRAM) 50 MG tablet Take 1 tablet (50 mg total) by mouth every 6 (six) hours as needed. (Patient taking differently: Take 50 mg by mouth every 6 (six) hours as needed for moderate pain. ) 120 tablet 2   No current facility-administered medications on file prior to visit.     Review of Systems  Constitutional: Negative for unusual diaphoresis or night sweats HENT: Negative for ringing in ear or discharge Eyes: Negative for double vision or worsening visual disturbance.  Respiratory: Negative for choking and stridor.   Gastrointestinal: Negative for vomiting or other signifcant bowel change Genitourinary: Negative for hematuria or change in urine volume.  Musculoskeletal: Negative for other MSK pain or swelling Skin: Negative for color change and worsening wound.  Neurological: Negative for tremors and numbness other than noted  Psychiatric/Behavioral: Negative for decreased concentration or agitation other than above       Objective:   Physical Exam BP 144/76 mmHg  Pulse 92  Temp(Src) 98.3 F (36.8 C) (Oral)  Ht 5\' 6"  (1.676 m)  Wt 146 lb (66.225 kg)  BMI 23.58 kg/m2  SpO2 97% VS noted, mild hoarseness noted Constitutional: Pt appears in no significant distress HENT: Head: NCAT.  Right Ear: External ear normal.  Left Ear: External ear normal.  Eyes: . Pupils are equal, round, and reactive to light. Conjunctivae and EOM are normal Neck: Normal range of motion. Neck supple.  Cardiovascular: Normal rate and regular rhythm.   Pulmonary/Chest: Effort normal and breath sounds without rales or wheezing.  Abd:  Soft, NT, ND, + BS Neurological: Pt is alert. Not confused , motor grossly intact Skin: Skin is warm. No rash, no LE edema Psychiatric: Pt behavior is normal. No agitation.      Assessment & Plan:

## 2015-03-27 NOTE — Assessment & Plan Note (Signed)
Etiology unclear, cant r/o malignancy - for ENT referral, liekly needs nasopharyngoscopy

## 2015-03-27 NOTE — Progress Notes (Signed)
Pre visit review using our clinic review tool, if applicable. No additional management support is needed unless otherwise documented below in the visit note. 

## 2015-03-27 NOTE — Assessment & Plan Note (Signed)
Record reviewed, agree with assessment but I think she should also be ruled out for ischemia cardiac - for stress test, cont current tx

## 2015-03-27 NOTE — Assessment & Plan Note (Signed)
Recurrent intermittent, with recent wt loss, prior eval neg, for Gi referral

## 2015-03-27 NOTE — Assessment & Plan Note (Signed)
Etiology, Mild to mod worsening,  recent data reviewed with pt, and pt to continue medical treatment as before, cont to monitor, encourage po

## 2015-03-28 NOTE — Telephone Encounter (Signed)
Spoke with patient and scheduled prop colonoscopy on 06/09/15 at 10:30 AM and pre visit on 05/07/15 at 10:30 AM.

## 2015-03-28 NOTE — Telephone Encounter (Signed)
Left a message for patient to call back. 

## 2015-04-07 ENCOUNTER — Encounter: Payer: Self-pay | Admitting: Cardiology

## 2015-04-09 ENCOUNTER — Telehealth (HOSPITAL_COMMUNITY): Payer: Self-pay

## 2015-04-09 NOTE — Telephone Encounter (Signed)
Left message on voicemail in reference to upcoming appointment scheduled for 04-14-2015. Phone number given for a call back so details instructions can be given. Alaa Mullally A   

## 2015-04-10 ENCOUNTER — Telehealth (HOSPITAL_COMMUNITY): Payer: Self-pay | Admitting: *Deleted

## 2015-04-10 NOTE — Telephone Encounter (Signed)
Patient given detailed instructions per Myocardial Perfusion Study Information Sheet for the test on 04/14/15 at 945. Patient notified to arrive 15 minutes early and that it is imperative to arrive on time for appointment to keep from having the test rescheduled.  If you need to cancel or reschedule your appointment, please call the office within 24 hours of your appointment. Failure to do so may result in a cancellation of your appointment, and a $50 no show fee. Patient verbalized understanding.Michelle CharMary J Anissa Abbs, RN

## 2015-04-14 ENCOUNTER — Ambulatory Visit (HOSPITAL_COMMUNITY): Payer: Medicare Other | Attending: Cardiovascular Disease

## 2015-04-14 DIAGNOSIS — R9439 Abnormal result of other cardiovascular function study: Secondary | ICD-10-CM | POA: Diagnosis not present

## 2015-04-14 DIAGNOSIS — R55 Syncope and collapse: Secondary | ICD-10-CM | POA: Insufficient documentation

## 2015-04-14 DIAGNOSIS — R002 Palpitations: Secondary | ICD-10-CM | POA: Insufficient documentation

## 2015-04-14 DIAGNOSIS — R079 Chest pain, unspecified: Secondary | ICD-10-CM | POA: Diagnosis not present

## 2015-04-14 DIAGNOSIS — R0609 Other forms of dyspnea: Secondary | ICD-10-CM | POA: Diagnosis not present

## 2015-04-14 DIAGNOSIS — I10 Essential (primary) hypertension: Secondary | ICD-10-CM | POA: Diagnosis not present

## 2015-04-14 DIAGNOSIS — E119 Type 2 diabetes mellitus without complications: Secondary | ICD-10-CM | POA: Insufficient documentation

## 2015-04-14 LAB — MYOCARDIAL PERFUSION IMAGING
CHL CUP NUCLEAR SRS: 11
CHL CUP NUCLEAR SSS: 11
CHL CUP RESTING HR STRESS: 78 {beats}/min
CHL CUP STRESS STAGE 1 GRADE: 0 %
CHL CUP STRESS STAGE 1 HR: 77 {beats}/min
CHL CUP STRESS STAGE 1 SPEED: 0 mph
CHL CUP STRESS STAGE 2 GRADE: 0 %
CHL CUP STRESS STAGE 4 SPEED: 0 mph
CHL CUP STRESS STAGE 5 DBP: 65 mmHg
CHL CUP STRESS STAGE 5 GRADE: 0 %
CHL CUP STRESS STAGE 5 HR: 96 {beats}/min
CHL CUP STRESS STAGE 5 SBP: 140 mmHg
CHL CUP STRESS STAGE 5 SPEED: 0 mph
CHL CUP STRESS STAGE 6 DBP: 63 mmHg
CHL CUP STRESS STAGE 6 SPEED: 0 mph
CSEPEW: 1 METS
CSEPPHR: 96 {beats}/min
CSEPPMHR: 69 %
LV sys vol: 18 mL
LVDIAVOL: 61 mL
NUC STRESS TID: 0.79
RATE: 0.24
SDS: 0
Stage 2 HR: 76 {beats}/min
Stage 2 Speed: 0 mph
Stage 3 DBP: 87 mmHg
Stage 3 Grade: 0 %
Stage 3 HR: 91 {beats}/min
Stage 3 SBP: 147 mmHg
Stage 3 Speed: 0 mph
Stage 4 Grade: 0 %
Stage 4 HR: 96 {beats}/min
Stage 6 Grade: 0 %
Stage 6 HR: 92 {beats}/min
Stage 6 SBP: 141 mmHg

## 2015-04-14 MED ORDER — TECHNETIUM TC 99M SESTAMIBI GENERIC - CARDIOLITE
32.7000 | Freq: Once | INTRAVENOUS | Status: AC | PRN
  Administered 2015-04-14: 32.7 via INTRAVENOUS

## 2015-04-14 MED ORDER — TECHNETIUM TC 99M SESTAMIBI GENERIC - CARDIOLITE
10.6000 | Freq: Once | INTRAVENOUS | Status: AC | PRN
  Administered 2015-04-14: 11 via INTRAVENOUS

## 2015-04-14 MED ORDER — REGADENOSON 0.4 MG/5ML IV SOLN
0.4000 mg | Freq: Once | INTRAVENOUS | Status: AC
  Administered 2015-04-14: 0.4 mg via INTRAVENOUS

## 2015-05-06 ENCOUNTER — Encounter: Payer: Self-pay | Admitting: Internal Medicine

## 2015-05-06 LAB — HM DIABETES EYE EXAM

## 2015-05-07 ENCOUNTER — Ambulatory Visit (AMBULATORY_SURGERY_CENTER): Payer: Self-pay

## 2015-05-07 ENCOUNTER — Other Ambulatory Visit: Payer: Self-pay | Admitting: Internal Medicine

## 2015-05-07 VITALS — Ht 64.5 in | Wt 152.0 lb

## 2015-05-07 DIAGNOSIS — R197 Diarrhea, unspecified: Secondary | ICD-10-CM

## 2015-05-07 MED ORDER — SUPREP BOWEL PREP KIT 17.5-3.13-1.6 GM/177ML PO SOLN: 1.0000 | Freq: Once | ORAL | Status: DC

## 2015-05-07 NOTE — Progress Notes (Signed)
No allergies to eggs or soy No past problems with anesthesia No diet/weight loss meds No home oxygen  internet broke; "i dont give my number out"

## 2015-06-06 ENCOUNTER — Other Ambulatory Visit: Payer: Self-pay | Admitting: Internal Medicine

## 2015-06-06 ENCOUNTER — Telehealth: Payer: Self-pay | Admitting: Internal Medicine

## 2015-06-06 NOTE — Telephone Encounter (Signed)
Pt called in stating [facility] and he told her that [name] should change patients Metformin. He said that may be why her legs are swollen.  She states the pharmacy has faxed a request over. Patient would like a call back asap. She says its very important

## 2015-06-09 ENCOUNTER — Encounter: Payer: Medicare Other | Admitting: Gastroenterology

## 2015-06-09 MED ORDER — SITAGLIPTIN PHOSPHATE 50 MG PO TABS: 50.0000 mg | ORAL_TABLET | Freq: Every day | ORAL | Status: DC

## 2015-06-09 NOTE — Telephone Encounter (Signed)
Please advise 

## 2015-06-09 NOTE — Telephone Encounter (Signed)
Pt informed. Pt stated that she did not know if she would be able to afford the new medication every month.

## 2015-06-09 NOTE — Telephone Encounter (Signed)
There may have been some misunderstanding or confusion, as metformin does not cause leg swelling  Maybe Dr Eliott Nineunham was concerned that her kidney function was too slow to take the metformin now.  OK to stop the metformin  OK to start Januvia 50 mg per day  If she takes this, she will not need endo referral. thanks

## 2015-06-09 NOTE — Telephone Encounter (Signed)
Pt is additionally requesting for a referral to Endo.

## 2015-06-10 NOTE — Telephone Encounter (Signed)
The only other common generic medications are sulfonylureas which can lead to too low sugar with kidneys slowed, and actos which many persons are not taking now due to risk of bladder cancer.    If the [place] is not affordable, please ask pt to ask at the pharmacy about more affordable similar medications that might be better covered by her insurance, as I would not be able to know this, thanks

## 2015-06-10 NOTE — Telephone Encounter (Signed)
Pt advised and states that she is going to apply for cost assistance from the manufacture

## 2015-06-23 LAB — HM MAMMOGRAPHY

## 2015-06-24 ENCOUNTER — Encounter: Payer: Self-pay | Admitting: Internal Medicine

## 2015-07-10 ENCOUNTER — Ambulatory Visit (AMBULATORY_SURGERY_CENTER): Payer: Medicare Other | Admitting: Gastroenterology

## 2015-07-10 ENCOUNTER — Encounter: Payer: Self-pay | Admitting: Gastroenterology

## 2015-07-10 VITALS — BP 102/57 | HR 73 | Temp 96.0°F | Resp 19 | Ht 64.5 in | Wt 152.0 lb

## 2015-07-10 DIAGNOSIS — R197 Diarrhea, unspecified: Secondary | ICD-10-CM | POA: Diagnosis present

## 2015-07-10 DIAGNOSIS — K5730 Diverticulosis of large intestine without perforation or abscess without bleeding: Secondary | ICD-10-CM

## 2015-07-10 LAB — GLUCOSE, CAPILLARY
GLUCOSE-CAPILLARY: 113 mg/dL — AB (ref 65–99)
Glucose-Capillary: 88 mg/dL (ref 65–99)

## 2015-07-10 MED ORDER — SODIUM CHLORIDE 0.9 % IV SOLN: 500.0000 mL | INTRAVENOUS | Status: DC

## 2015-07-10 NOTE — Progress Notes (Signed)
Patient awakening,vss,report to rn 

## 2015-07-10 NOTE — Patient Instructions (Signed)
YOU HAD AN ENDOSCOPIC PROCEDURE TODAY AT THE [facility]:   Refer to the procedure report that was given to you for any specific questions about what was found during the examination.  If the procedure report does not answer your questions, please call your gastroenterologist to clarify.  If you requested that your care partner not be given the details of your procedure findings, then the procedure report has been included in a sealed envelope for you to review at your convenience later.  YOU SHOULD EXPECT: Some feelings of bloating in the abdomen. Passage of more gas than usual.  Walking can help get rid of the air that was put into your GI tract during the procedure and reduce the bloating. If you had a lower endoscopy (such as a colonoscopy or flexible sigmoidoscopy) you may notice spotting of blood in your stool or on the toilet paper. If you underwent a bowel prep for your procedure, you may not have a normal bowel movement for a few days.  Please Note:  You might notice some irritation and congestion in your nose or some drainage.  This is from the oxygen used during your procedure.  There is no need for concern and it should clear up in a day or so.  SYMPTOMS TO REPORT IMMEDIATELY:   Following lower endoscopy (colonoscopy or flexible sigmoidoscopy):  Excessive amounts of blood in the stool  Significant tenderness or worsening of abdominal pains  Swelling of the abdomen that is new, acute  Fever of 100F or higher  For urgent or emergent issues, a gastroenterologist can be reached at any hour by calling [phone].   DIET: Your first meal following the procedure should be a small meal and then it is ok to progress to your normal diet. Heavy or fried foods are harder to digest and may make you feel nauseous or bloated.  Likewise, meals heavy in dairy and vegetables can increase bloating.  Drink plenty of fluids but you should avoid alcoholic beverages for 24  hours.  ACTIVITY:  You should plan to take it easy for the rest of today and you should NOT DRIVE or use heavy machinery until tomorrow (because of the sedation medicines used during the test).    FOLLOW UP: Our staff will call the number listed on your records the next business day following your procedure to check on you and address any questions or concerns that you may have regarding the information given to you following your procedure. If we do not reach you, we will leave a message.  However, if you are feeling well and you are not experiencing any problems, there is no need to return our call.  We will assume that you have returned to your regular daily activities without incident.  If any biopsies were taken you will be contacted by phone or by letter within the next 1-3 weeks.  Please call us at [phone] if you have not heard about the biopsies in 3 weeks.    SIGNATURES/CONFIDENTIALITY: You and/or your care partner have signed paperwork which will be entered into your electronic medical record.  These signatures attest to the fact that that the information above on your After Visit Summary has been reviewed and is understood.  Full responsibility of the confidentiality of this discharge information lies with you and/or your care-partner.  Polyp/Diverticulosis/Hemorrhoid handout given Await pathology results Resume diet and medication today

## 2015-07-10 NOTE — Op Note (Signed)
[facility] [address] [place], [address]   COLONOSCOPY PROCEDURE REPORT  PATIENT: Michelle Morton, Michelle Morton  MR#: [id number] BIRTHDATE: [birth year] , 83  yrs. old GENDER: female ENDOSCOPIST: Benancio Deeds, MD REFERRED BY: PROCEDURE DATE:  07/10/2015 PROCEDURE:   Colonoscopy, diagnostic and Colonoscopy with biopsy First Screening Colonoscopy - Avg.  risk and is 50 yrs.  old or older - No.  Prior Negative Screening - Now for repeat screening. N/A  History of Adenoma - Now for follow-up colonoscopy & has been > or = to 3 yrs.  N/A  Polyps removed today? No Recommend repeat exam, <10 yrs? No ASA CLASS:   Class III INDICATIONS:chronic diarrhea, average risk. MEDICATIONS: Propofol 350 mg IV  DESCRIPTION OF PROCEDURE:   After the risks benefits and alternatives of the procedure were thoroughly explained, informed consent was obtained.  The digital rectal exam revealed no abnormalities of the rectum.   The LB [id number]  endoscope was introduced through the anus and advanced to the terminal ileum which was intubated for a short distance. No adverse events experienced.   The quality of the prep was adequate  The instrument was then slowly withdrawn as the colon was fully examined. Estimated blood loss is zero unless otherwise noted in this procedure report.   COLON FINDINGS: The colon was tortous and was very redundant, making for a prolonged cecal intubation.  The colonic mucosa was normal without overt inflammatory changes.  No polyps or mass lesions were noted.  Moderate diverticulosis were noted in the left colon.  The terminal ileum was normal.  Random biopsies were taken of the right and left colon to rule out microscopic colitis.  Retroflexed views revealed internal hemorrhoids. The time to cecum = 12.4 Withdrawal time = 19.1   The scope was withdrawn and the procedure completed. COMPLICATIONS: There were no immediate complications.  ENDOSCOPIC  IMPRESSION: Redundant / tortous colon Normal mucosa otherwise, biopsies taken to rule out microscopic colitis Moderate diverticulosis Internal hemorrhoids  RECOMMENDATIONS: Await pathology results Resume diet Resume medications  eSigned:  Benancio Deeds, MD 07/10/2015 2:10 PM revisecc:  the patient

## 2015-07-10 NOTE — Progress Notes (Signed)
Called to room to assist during endoscopic procedure.  Patient ID and intended procedure confirmed with present staff. Received instructions for my participation in the procedure from the performing physician.  

## 2015-07-11 ENCOUNTER — Telehealth: Payer: Self-pay | Admitting: Emergency Medicine

## 2015-07-11 NOTE — Telephone Encounter (Signed)
Left message per f/u, identifier present

## 2015-08-24 ENCOUNTER — Other Ambulatory Visit: Payer: Self-pay | Admitting: Internal Medicine

## 2015-08-25 NOTE — Telephone Encounter (Signed)
Medication has been sent to pharmacy.  °

## 2015-08-25 NOTE — Telephone Encounter (Signed)
Please advise, patient requesting refill  

## 2015-08-25 NOTE — Telephone Encounter (Signed)
Done hardcopy to Corinne  

## 2015-09-02 ENCOUNTER — Telehealth: Payer: Self-pay | Admitting: Internal Medicine

## 2015-09-02 MED ORDER — TIZANIDINE HCL 4 MG PO TABS: 4.0000 mg | ORAL_TABLET | Freq: Four times a day (QID) | ORAL | Status: DC | PRN

## 2015-09-02 NOTE — Telephone Encounter (Signed)
Pt was wondering if [name] can send her in something since she pull her muscle, offer an appt but pt wants to speak to the assistant first. Please advise.

## 2015-09-02 NOTE — Telephone Encounter (Signed)
Ok for tizandine as this is safer in older persons - done erx

## 2015-09-05 ENCOUNTER — Other Ambulatory Visit: Payer: Self-pay | Admitting: Internal Medicine

## 2015-09-08 ENCOUNTER — Telehealth: Payer: Self-pay | Admitting: Internal Medicine

## 2015-09-08 MED ORDER — ONETOUCH ULTRASOFT LANCETS MISC: 1.0000 | Freq: Two times a day (BID) | Status: DC

## 2015-09-08 MED ORDER — GLUCOSE BLOOD VI STRP: 1.0000 | ORAL_STRIP | Freq: Two times a day (BID) | Status: DC

## 2015-09-08 NOTE — Telephone Encounter (Signed)
Pt called in and said that her meter broke and she wants to know if Dr Jonny Ruizjohn has another one she can have? ( Meter to check her sugar)

## 2015-09-08 NOTE — Telephone Encounter (Signed)
Called pt to verify which monitor she uses pt states she use one touvh verio. Inform her will leave BS monitor for her to pick-up & will send supples to [facility]...Michelle Morton/lmb

## 2015-09-24 ENCOUNTER — Emergency Department (HOSPITAL_COMMUNITY)
Admission: EM | Admit: 2015-09-24 | Discharge: 2015-09-24 | Disposition: A | Discharge status: Home / Self Care | Payer: Medicare Other | Attending: Emergency Medicine | Admitting: Emergency Medicine

## 2015-09-24 ENCOUNTER — Encounter (HOSPITAL_COMMUNITY): Payer: Self-pay | Admitting: Emergency Medicine

## 2015-09-24 DIAGNOSIS — M545 Low back pain, unspecified: Secondary | ICD-10-CM

## 2015-09-24 DIAGNOSIS — H9191 Unspecified hearing loss, right ear: Secondary | ICD-10-CM | POA: Diagnosis not present

## 2015-09-24 DIAGNOSIS — E039 Hypothyroidism, unspecified: Secondary | ICD-10-CM | POA: Diagnosis not present

## 2015-09-24 DIAGNOSIS — Z87891 Personal history of nicotine dependence: Secondary | ICD-10-CM | POA: Insufficient documentation

## 2015-09-24 DIAGNOSIS — Z7984 Long term (current) use of oral hypoglycemic drugs: Secondary | ICD-10-CM | POA: Insufficient documentation

## 2015-09-24 DIAGNOSIS — Z880 Allergy status to penicillin: Secondary | ICD-10-CM | POA: Insufficient documentation

## 2015-09-24 DIAGNOSIS — Z7951 Long term (current) use of inhaled steroids: Secondary | ICD-10-CM | POA: Insufficient documentation

## 2015-09-24 DIAGNOSIS — M170 Bilateral primary osteoarthritis of knee: Secondary | ICD-10-CM | POA: Diagnosis not present

## 2015-09-24 DIAGNOSIS — E785 Hyperlipidemia, unspecified: Secondary | ICD-10-CM | POA: Diagnosis not present

## 2015-09-24 DIAGNOSIS — K5900 Constipation, unspecified: Secondary | ICD-10-CM | POA: Diagnosis not present

## 2015-09-24 DIAGNOSIS — Z87442 Personal history of urinary calculi: Secondary | ICD-10-CM | POA: Diagnosis not present

## 2015-09-24 DIAGNOSIS — E119 Type 2 diabetes mellitus without complications: Secondary | ICD-10-CM | POA: Insufficient documentation

## 2015-09-24 DIAGNOSIS — I10 Essential (primary) hypertension: Secondary | ICD-10-CM | POA: Insufficient documentation

## 2015-09-24 DIAGNOSIS — Z79899 Other long term (current) drug therapy: Secondary | ICD-10-CM | POA: Diagnosis not present

## 2015-09-24 LAB — URINE MICROSCOPIC-ADD ON

## 2015-09-24 LAB — URINALYSIS, ROUTINE W REFLEX MICROSCOPIC
Bilirubin Urine: NEGATIVE
GLUCOSE, UA: NEGATIVE mg/dL
HGB URINE DIPSTICK: NEGATIVE
Ketones, ur: NEGATIVE mg/dL
Leukocytes, UA: NEGATIVE
Nitrite: NEGATIVE
Protein, ur: 30 mg/dL — AB
SPECIFIC GRAVITY, URINE: 1.022 (ref 1.005–1.030)
pH: 6 (ref 5.0–8.0)

## 2015-09-24 MED ORDER — METHYLPREDNISOLONE SODIUM SUCC 125 MG IJ SOLR
80.0000 mg | Freq: Once | INTRAMUSCULAR | Status: AC
  Administered 2015-09-24: 80 mg via INTRAMUSCULAR
  Filled 2015-09-24: qty 2

## 2015-09-24 MED ORDER — DOCUSATE SODIUM 100 MG PO CAPS: 100.0000 mg | ORAL_CAPSULE | Freq: Two times a day (BID) | ORAL | Status: DC

## 2015-09-24 MED ORDER — OXYCODONE-ACETAMINOPHEN 5-325 MG PO TABS
ORAL_TABLET | ORAL | Status: AC
  Filled 2015-09-24: qty 1

## 2015-09-24 MED ORDER — MAGNESIUM CITRATE PO SOLN: ORAL | Status: DC

## 2015-09-24 MED ORDER — OXYCODONE-ACETAMINOPHEN 5-325 MG PO TABS
1.0000 | ORAL_TABLET | ORAL | Status: DC | PRN
  Administered 2015-09-24: 1 via ORAL

## 2015-09-24 MED ORDER — METHYLPREDNISOLONE SODIUM SUCC 125 MG IJ SOLR: 80.0000 mg | Freq: Once | INTRAMUSCULAR | Status: DC

## 2015-09-24 MED ORDER — ONDANSETRON 4 MG PO TBDP
4.0000 mg | ORAL_TABLET | Freq: Once | ORAL | Status: AC
  Administered 2015-09-24: 4 mg via ORAL

## 2015-09-24 MED ORDER — ONDANSETRON 4 MG PO TBDP
ORAL_TABLET | ORAL | Status: AC
  Filled 2015-09-24: qty 1

## 2015-09-24 MED ORDER — OXYCODONE-ACETAMINOPHEN 5-325 MG PO TABS: 2.0000 | ORAL_TABLET | ORAL | Status: DC | PRN

## 2015-09-24 NOTE — ED Provider Notes (Signed)
  Face-to-face evaluation   History: Ongoing back pain, evaluation undertaken by PCP, with planned outpatient MRI. No loss of bowel or bladder function. She has been constipated for 1 week.  Physical exam: Alert, calm, cooperative. No respiratory distress. She is lucid.  Medical screening examination/treatment/procedure(s) were conducted as a shared visit with non-physician practitioner(s) and myself.  I personally evaluated the patient during the encounter  Mancel BaleElliott Jerrika Ledlow, MD 09/26/15 [phone]

## 2015-09-24 NOTE — Discharge Instructions (Signed)

## 2015-09-24 NOTE — ED Notes (Signed)
Pt sts left sided back pain x 1 month getting more severe; pt with difficulty ambulating; pt sts constipation at present as well

## 2015-09-24 NOTE — ED Provider Notes (Signed)
CSN: [id number]     Arrival date & time 09/24/15  1049 History   First MD Initiated Contact with Patient 09/24/15 1452     Chief Complaint  Patient presents with  . Back Pain     (Consider location/radiation/quality/duration/timing/severity/associated sxs/prior Treatment) Patient is a 82 y.o. female presenting with back pain. The history is provided by the patient and a caregiver. No language interpreter was used.  Back Pain Location:  Generalized Quality:  Aching Radiates to:  L shoulder Pain severity:  Moderate Worse during: 1 month. Onset quality:  Gradual Duration:  1 month Timing:  Constant Progression:  Worsening Chronicity:  New Relieved by:  Nothing Worsened by:  Nothing tried Ineffective treatments:  None tried Associated symptoms: no abdominal pain   Risk factors: no lack of exercise     Past Medical History  Diagnosis Date  . Hypertension   . Arthus phenomenon   . Hyperlipidemia   . Diabetes mellitus, type 2 (HCC)   . Hypothyroidism   . Allergic rhinitis, cause unspecified 02/14/2013  . SVD (spontaneous vaginal delivery)     x 4  . Varicose veins     lower legs  . Osteoarthritis, knee     knees - otc med prn  . History of blood transfusion     [name] - unsure number of units transfused  . Hearing loss     right ear, no hearing aid  . Arthritis   . Kidney stones    Past Surgical History  Procedure Laterality Date  . Abdominal hysterectomy    . Replacement total knee Bilateral   . Tonsillectomy    . Wisdom tooth extraction    . Cataract extraction Bilateral   . Appendectomy    . Colonoscopy    . Kidney stone surgery      removal of stone  . Anterior and posterior repair N/A 04/17/2014    Procedure: ANTERIOR (CYSTOCELE) AND POSTERIOR REPAIR (RECTOCELE);  Surgeon: Lavina Hamman, MD;  Location: [facility];  Service: Gynecology;  Laterality: N/A;   Family History  Problem Relation Age of Onset  . Diabetes Father   . Hypertension Father   .  Breast cancer Daughter   . Diabetes Mother   . Diabetes Brother     x 1  . Diabetes Sister     x 4  . Colon cancer Neg Hx   . Esophageal cancer Neg Hx   . Pancreatic cancer Neg Hx   . Liver disease Neg Hx   . Kidney disease Neg Hx    Social History  Substance Use Topics  . Smoking status: Former Smoker -- 1.00 packs/day for 16 years    Types: Cigarettes    Quit date: 05/10/1992  . Smokeless tobacco: Never Used  . Alcohol Use: No   OB History    No data available     Review of Systems  Gastrointestinal: Negative for abdominal pain.  Musculoskeletal: Positive for back pain.  All other systems reviewed and are negative.     Allergies  Penicillins  Home Medications   Prior to Admission medications   Medication Sig Start Date End Date Taking? Authorizing Provider  BIOTIN PO Take 1 tablet by mouth daily.    Historical Provider, MD  cetirizine (ZYRTEC) 10 MG tablet Take 10 mg by mouth daily.    Historical Provider, MD  diclofenac sodium (VOLTAREN) 1 % GEL Apply 4 g topically 4 (four) times daily. GENERIC OK Patient taking differently: Apply 4 g topically  4 (four) times daily as needed (knee pain). GENERIC OK 01/07/15   Corwin LevinsJames W [name], MD  docusate sodium (COLACE) 100 MG capsule Take 1 capsule (100 mg total) by mouth every 12 (twelve) hours. 09/24/15   Elson AreasLeslie K Sofia, PA-C  fluticasone [facility](FLONASE) 50 MCG/ACT nasal spray Place 2 sprays into both nostrils daily. 05/30/14   Corwin LevinsJames W [name], MD  furosemide (LASIX) 40 MG tablet Take 40 mg by mouth daily.    Historical Provider, MD  glucose blood (ONETOUCH VERIO) test strip 1 each by Other route 2 (two) times daily. Use to check blood sugars twice a day Dx E11.9 09/08/15   Corwin LevinsJames W [name], MD  Lancets [place](ONETOUCH ULTRASOFT) lancets 1 each by Other route 2 (two) times daily. Use to help check blood sugars twice a day Dx E11.9 09/08/15   Corwin LevinsJames W [name], MD  levothyroxine (SYNTHROID, LEVOTHROID) 88 MCG tablet TAKE ONE TABLET BY MOUTH ONCE DAILY 06/06/15    Corwin LevinsJames W [name], MD  magnesium citrate SOLN 1/2 bottle today, 1/2 bottle tomorrow 09/24/15   Elson AreasLeslie K Sofia, PA-C  oxyCODONE-acetaminophen (PERCOCET/ROXICET) 5-325 MG tablet Take 2 tablets by mouth every 4 (four) hours as needed for severe pain. 09/24/15   Elson AreasLeslie K Sofia, PA-C  pravastatin (PRAVACHOL) 40 MG tablet TAKE ONE TABLET BY MOUTH ONCE DAILY 09/05/15   Corwin LevinsJames W [name], MD  sitaGLIPtin (JANUVIA) 50 MG tablet Take 1 tablet (50 mg total) by mouth daily. 06/09/15   Corwin LevinsJames W [name], MD  tiZANidine (ZANAFLEX) 4 MG tablet Take 1 tablet (4 mg total) by mouth every 6 (six) hours as needed for muscle spasms. 09/02/15   Corwin LevinsJames W [name], MD  traMADol (ULTRAM) 50 MG tablet TAKE ONE TABLET BY MOUTH EVERY 6 HOURS AS NEEDED 08/25/15   Corwin LevinsJames W [name], MD   BP 180/81 mmHg  Pulse 84  Temp(Src) 98.2 F (36.8 C) (Oral)  Resp 16  SpO2 100% Physical Exam  Constitutional: She is oriented to person, place, and time. She appears well-developed and well-nourished.  HENT:  Head: Normocephalic and atraumatic.  Eyes: EOM are normal. Pupils are equal, round, and reactive to light.  Neck: Normal range of motion.  Cardiovascular: Normal rate and normal heart sounds.   Pulmonary/Chest: Effort normal.  Abdominal: She exhibits no distension.  Musculoskeletal: Normal range of motion.  Tender left lower back,  Pain with range of motion,  nv and ns intact  Neurological: She is alert and oriented to person, place, and time.  Psychiatric: She has a normal mood and affect.  Nursing note and vitals reviewed.   ED Course  Procedures (including critical care time) Labs Review Labs Reviewed  URINALYSIS, ROUTINE W REFLEX MICROSCOPIC (NOT [facility]) - Abnormal; Notable for the following:    Protein, ur 30 (*)    All other components within normal limits  URINE MICROSCOPIC-ADD ON - Abnormal; Notable for the following:    Squamous Epithelial / LPF 6-30 (*)    Bacteria, UA RARE (*)    All other components within normal limits  URINE CULTURE     Imaging Review No results found. I have personally reviewed and evaluated these images and lab results as part of my medical decision-making.   EKG Interpretation None      MDM   Final diagnoses:  Left-sided low back pain without sciatica    Meds ordered this encounter  Medications  . oxyCODONE-acetaminophen (PERCOCET/ROXICET) 5-325 MG per tablet 1 tablet    Sig:   . ondansetron (ZOFRAN-ODT) disintegrating  tablet 4 mg    Sig:   . ondansetron (ZOFRAN-ODT) 4 MG disintegrating tablet    Sig:     Branch, Jessica   : cabinet override  . oxyCODONE-acetaminophen (PERCOCET/ROXICET) 5-325 MG per tablet    Sig:     Branch, Jessica   : cabinet override  . DISCONTD: methylPREDNISolone sodium succinate (SOLU-MEDROL) 125 mg/2 mL injection 80 mg    Sig:   . methylPREDNISolone sodium succinate (SOLU-MEDROL) 125 mg/2 mL injection 80 mg    Sig:   . oxyCODONE-acetaminophen (PERCOCET/ROXICET) 5-325 MG tablet    Sig: Take 2 tablets by mouth every 4 (four) hours as needed for severe pain.    Dispense:  16 tablet    Refill:  0    Order Specific Question:  Supervising Provider    Answer:  MILLER, BRIAN [[id number]]  . docusate sodium (COLACE) 100 MG capsule    Sig: Take 1 capsule (100 mg total) by mouth every 12 (twelve) hours.    Dispense:  60 capsule    Refill:  0    Order Specific Question:  Supervising Provider    Answer:  MILLER, BRIAN [[id number]]  . magnesium citrate SOLN    Sig: 1/2 bottle today, 1/2 bottle tomorrow    Dispense:  195 mL    Refill:  0    Order Specific Question:  Supervising Provider    Answer:  Eber Hong [[id number]]      Lonia Skinner [place], PA-C 09/24/15 1630  Mancel Bale, MD 09/26/15 4540  Mancel Bale, MD 09/26/15 [phone]

## 2015-09-25 LAB — URINE CULTURE: Culture: NO GROWTH

## 2015-10-05 ENCOUNTER — Other Ambulatory Visit: Payer: Self-pay | Admitting: Internal Medicine

## 2015-10-16 ENCOUNTER — Ambulatory Visit (INDEPENDENT_AMBULATORY_CARE_PROVIDER_SITE_OTHER): Payer: Medicare Other | Admitting: Internal Medicine

## 2015-10-16 ENCOUNTER — Other Ambulatory Visit (INDEPENDENT_AMBULATORY_CARE_PROVIDER_SITE_OTHER): Payer: Medicare Other

## 2015-10-16 ENCOUNTER — Encounter: Payer: Self-pay | Admitting: Internal Medicine

## 2015-10-16 VITALS — BP 140/82 | HR 92 | Temp 98.0°F | Resp 20 | Wt 157.0 lb

## 2015-10-16 DIAGNOSIS — M549 Dorsalgia, unspecified: Secondary | ICD-10-CM | POA: Insufficient documentation

## 2015-10-16 DIAGNOSIS — M545 Low back pain: Secondary | ICD-10-CM | POA: Diagnosis not present

## 2015-10-16 DIAGNOSIS — I10 Essential (primary) hypertension: Secondary | ICD-10-CM

## 2015-10-16 DIAGNOSIS — E083299 Diabetes mellitus due to underlying condition with mild nonproliferative diabetic retinopathy without macular edema, unspecified eye: Secondary | ICD-10-CM

## 2015-10-16 DIAGNOSIS — Z0001 Encounter for general adult medical examination with abnormal findings: Secondary | ICD-10-CM | POA: Diagnosis not present

## 2015-10-16 DIAGNOSIS — R6889 Other general symptoms and signs: Secondary | ICD-10-CM

## 2015-10-16 DIAGNOSIS — E785 Hyperlipidemia, unspecified: Secondary | ICD-10-CM | POA: Diagnosis not present

## 2015-10-16 LAB — URINALYSIS, ROUTINE W REFLEX MICROSCOPIC
Bilirubin Urine: NEGATIVE
HGB URINE DIPSTICK: NEGATIVE
KETONES UR: NEGATIVE
LEUKOCYTES UA: NEGATIVE
NITRITE: NEGATIVE
Specific Gravity, Urine: 1.01 (ref 1.000–1.030)
Total Protein, Urine: NEGATIVE
URINE GLUCOSE: NEGATIVE
UROBILINOGEN UA: 0.2 (ref 0.0–1.0)
pH: 5.5 (ref 5.0–8.0)

## 2015-10-16 LAB — MICROALBUMIN / CREATININE URINE RATIO
CREATININE, U: 151.7 mg/dL
MICROALB/CREAT RATIO: 3.2 mg/g (ref 0.0–30.0)
Microalb, Ur: 4.8 mg/dL — ABNORMAL HIGH (ref 0.0–1.9)

## 2015-10-16 LAB — BASIC METABOLIC PANEL
BUN: 55 mg/dL — AB (ref 6–23)
CHLORIDE: 100 meq/L (ref 96–112)
CO2: 30 meq/L (ref 19–32)
Calcium: 9.2 mg/dL (ref 8.4–10.5)
Creatinine, Ser: 2.26 mg/dL — ABNORMAL HIGH (ref 0.40–1.20)
GFR: 26.66 mL/min — ABNORMAL LOW (ref >60.00)
Glucose, Bld: 128 mg/dL — ABNORMAL HIGH (ref 70–99)
POTASSIUM: 3.7 meq/L (ref 3.5–5.1)
Sodium: 137 mEq/L (ref 135–145)

## 2015-10-16 LAB — CBC WITH DIFFERENTIAL/PLATELET
BASOS PCT: 0.2 % (ref 0.0–3.0)
Basophils Absolute: 0 10*3/uL (ref 0.0–0.1)
EOS ABS: 0.1 10*3/uL (ref 0.0–0.7)
Eosinophils Relative: 1.9 % (ref 0.0–5.0)
HCT: 35.5 % — ABNORMAL LOW (ref 36.0–46.0)
Hemoglobin: 11.7 g/dL — ABNORMAL LOW (ref 12.0–15.0)
Lymphocytes Relative: 31.3 % (ref 12.0–46.0)
Lymphs Abs: 2.1 10*3/uL (ref 0.7–4.0)
MCHC: 32.8 g/dL (ref 30.0–36.0)
MCV: 89.5 fl (ref 78.0–100.0)
MONO ABS: 0.4 10*3/uL (ref 0.1–1.0)
Monocytes Relative: 5.7 % (ref 3.0–12.0)
NEUTROS ABS: 4 10*3/uL (ref 1.4–7.7)
Neutrophils Relative %: 60.9 % (ref 43.0–77.0)
PLATELETS: 357 10*3/uL (ref 150.0–400.0)
RBC: 3.97 Mil/uL (ref 3.87–5.11)
RDW: 14.3 % (ref 11.5–15.5)
WBC: 6.6 10*3/uL (ref 4.0–10.5)

## 2015-10-16 LAB — HEMOGLOBIN A1C: HEMOGLOBIN A1C: 7.1 % — AB (ref 4.6–6.5)

## 2015-10-16 LAB — LIPID PANEL
CHOL/HDL RATIO: 3
Cholesterol: 154 mg/dL (ref 0–200)
HDL: 51.2 mg/dL (ref >39.00)
LDL Cholesterol: 86 mg/dL (ref 0–99)
NonHDL: 102.62
TRIGLYCERIDES: 85 mg/dL (ref 0.0–149.0)
VLDL: 17 mg/dL (ref 0.0–40.0)

## 2015-10-16 LAB — HEPATIC FUNCTION PANEL
ALT: 7 U/L (ref 0–35)
AST: 11 U/L (ref 0–37)
Albumin: 4.2 g/dL (ref 3.5–5.2)
Alkaline Phosphatase: 68 U/L (ref 39–117)
BILIRUBIN DIRECT: 0.1 mg/dL (ref 0.0–0.3)
BILIRUBIN TOTAL: 0.7 mg/dL (ref 0.2–1.2)
TOTAL PROTEIN: 7.4 g/dL (ref 6.0–8.3)

## 2015-10-16 LAB — TSH: TSH: 3.74 u[IU]/mL (ref 0.35–4.50)

## 2015-10-16 MED ORDER — OXYCODONE-ACETAMINOPHEN 5-325 MG PO TABS: 1.0000 | ORAL_TABLET | Freq: Every evening | ORAL | Status: DC | PRN

## 2015-10-16 NOTE — Assessment & Plan Note (Signed)
stable overall by history and exam, recent data reviewed with pt, and pt to continue medical treatment as before,  to f/u any worsening symptoms or concerns Lab Results  Component Value Date   HGBA1C 6.6* 10/01/2014

## 2015-10-16 NOTE — Progress Notes (Signed)
Pre visit review using our clinic review tool, if applicable. No additional management support is needed unless otherwise documented below in the visit note. 

## 2015-10-16 NOTE — Progress Notes (Signed)
Subjective:    Patient ID: Michelle Morton, female    DOB: [birth year], 82 y.o.   MRN: [id number]  HPI  Here for wellness and f/u; hx per duaghter and pt, no ortho records today,  Overall doing ok;  Pt denies Chest pain, worsening SOB, DOE, wheezing, orthopnea, PND, worsening LE edema, palpitations, dizziness or syncope.  Pt denies neurological change such as new headache, facial or extremity weakness.  Pt denies polydipsia, polyuria, or low sugar symptoms. Pt states overall good compliance with treatment and medications, good tolerability, and has been trying to follow appropriate diet.  Pt denies worsening depressive symptoms, suicidal ideation or panic. No fever, night sweats, wt loss, loss of appetite, or other constitutional symptoms.  Pt states good ability with ADL's, has low fall risk, home safety reviewed and adequate, no other significant changes in hearing or vision, and only occasionally active with exercise.Walking with walker today, does not usuaully do this at home. No recent falls excep a slide out of the bed a few months ago. Seeing orthopedic, had MRI 2 wks ago with "small fx near the left hip", now with staying with daughter at her home, has f/u ortho mon may 22, then consider re-start PT, wearing a specific brace from [employer] for back support. Pt continues to have recurring LBP without change in severity, bowel or bladder change, fever, wt loss,  worsening LE pain/numbness/weakness, gait change or falls., but did have signifcant constipation for 7 days at one point on the pain medication, now improved.Denies worsening reflux, abd pain, dysphagia, n/v, other bowel change or blood.  Improved after restarting the miralax she had stopped.has now CKD-4 and changed from metformin to [place], on this for about 3 mo.   Pt denies fever, wt loss, night sweats, loss of appetite, or other constitutional symptoms  Denies urinary symptoms such as dysuria, frequency, urgency, flank pain, hematuria or n/v,  fever, chills.  Has stair chair lift for getting up stairs to the bedrrom.  Asks for 1 po qhs percocet 5.325 #5 only until she can get back to surgeon next monday Past Medical History  Diagnosis Date  . Hypertension   . Arthus phenomenon   . Hyperlipidemia   . Diabetes mellitus, type 2 (HCC)   . Hypothyroidism   . Allergic rhinitis, cause unspecified 02/14/2013  . SVD (spontaneous vaginal delivery)     x 4  . Varicose veins     lower legs  . Osteoarthritis, knee     knees - otc med prn  . History of blood transfusion     [name] - unsure number of units transfused  . Hearing loss     right ear, no hearing aid  . Arthritis   . Kidney stones    Past Surgical History  Procedure Laterality Date  . Abdominal hysterectomy    . Replacement total knee Bilateral   . Tonsillectomy    . Wisdom tooth extraction    . Cataract extraction Bilateral   . Appendectomy    . Colonoscopy    . Kidney stone surgery      removal of stone  . Anterior and posterior repair N/A 04/17/2014    Procedure: ANTERIOR (CYSTOCELE) AND POSTERIOR REPAIR (RECTOCELE);  Surgeon: Lavina Hamman, MD;  Location: [facility];  Service: Gynecology;  Laterality: N/A;    reports that she quit smoking about 23 years ago. Her smoking use included Cigarettes. She has a 16 pack-year smoking history. She has never used smokeless tobacco. She  reports that she does not drink alcohol or use illicit drugs. family history includes Breast cancer in her daughter; Diabetes in her brother, father, mother, and sister; Hypertension in her father. There is no history of Colon cancer, Esophageal cancer, Pancreatic cancer, Liver disease, or Kidney disease. Allergies  Allergen Reactions  . Penicillins Rash    Allergic to IV penicillin but tolerates the oral form Has patient had a PCN reaction causing immediate rash, facial/tongue/throat swelling, SOB or lightheadedness with hypotension: no Has patient had a PCN reaction causing severe rash  involving mucus membranes or skin necrosis: no Has patient had a PCN reaction that required hospitalization already in the hospital for other procedure  Has patient had a PCN reaction occurring within the last 10 years: no If all of the above answers are "NO", then ma   . Current Outpatient Prescriptions on File Prior to Visit  Medication Sig Dispense Refill  . diclofenac sodium (VOLTAREN) 1 % GEL Apply 4 g topically 4 (four) times daily. GENERIC OK (Patient taking differently: Apply 4 g topically 4 (four) times daily as needed (knee pain). GENERIC OK) 400 g 11  . fluticasone (FLONASE) 50 MCG/ACT nasal spray Place 2 sprays into both nostrils daily. 16 g 5  . furosemide (LASIX) 40 MG tablet Take 40 mg by mouth daily.    Marland Kitchen. levothyroxine (SYNTHROID, LEVOTHROID) 88 MCG tablet TAKE ONE TABLET BY MOUTH ONCE DAILY 90 tablet 2  . oxyCODONE-acetaminophen (PERCOCET/ROXICET) 5-325 MG tablet Take 2 tablets by mouth every 4 (four) hours as needed for severe pain. 16 tablet 0  . pravastatin (PRAVACHOL) 40 MG tablet TAKE ONE TABLET BY MOUTH ONCE DAILY 30 tablet 2  . sitaGLIPtin (JANUVIA) 50 MG tablet Take 1 tablet (50 mg total) by mouth daily. 30 tablet 11  . traMADol (ULTRAM) 50 MG tablet TAKE ONE TABLET BY MOUTH EVERY 6 HOURS AS NEEDED 120 tablet 2   No current facility-administered medications on file prior to visit.   Review of Systems Constitutional: Negative for increased diaphoresis, or other activity, appetite or siginficant weight change other than noted HENT: Negative for worsening hearing loss, ear pain, facial swelling, mouth sores and neck stiffness.   Eyes: Negative for other worsening pain, redness or visual disturbance.  Respiratory: Negative for choking or stridor Cardiovascular: Negative for other chest pain and palpitations.  Gastrointestinal: Negative for worsening diarrhea, blood in stool, or abdominal distention Genitourinary: Negative for hematuria, flank pain or change in urine  volume.  Musculoskeletal: Negative for myalgias or other joint complaints.  Skin: Negative for other color change and wound or drainage.  Neurological: Negative for syncope and numbness. other than noted Hematological: Negative for adenopathy. or other swelling Psychiatric/Behavioral: Negative for hallucinations, SI, self-injury, decreased concentration or other worsening agitation.      Objective:   Physical Exam BP 140/82 mmHg  Pulse 92  Temp(Src) 98 F (36.7 C) (Oral)  Resp 20  Wt 157 lb (71.215 kg)  SpO2 98% VS noted, wearing back brace which limits exam Constitutional: Pt is oriented to person, place, and time. Appears well-developed and well-nourished, in no significant distress Head: Normocephalic and atraumatic  Eyes: Conjunctivae and EOM are normal. Pupils are equal, round, and reactive to light Right Ear: External ear normal.  Left Ear: External ear normal Nose: Nose normal.  Mouth/Throat: Oropharynx is clear and moist  Neck: Normal range of motion. Neck supple. No JVD present. No tracheal deviation present or significant neck LA or mass Cardiovascular: Normal rate, regular rhythm, normal  heart sounds and intact distal pulses.   Pulmonary/Chest: Effort normal and breath sounds without rales or wheezing  Abdominal: Soft. Bowel sounds are normal. NT. No HSM  Musculoskeletal: Normal range of motion. Exhibits no edema Lymphadenopathy: Has no cervical adenopathy.  Neurological: Pt is alert and oriented to person, place, and time. Pt has normal reflexes. No cranial nerve deficit. Motor grossly intact Skin: Skin is warm and dry. No rash noted or new ulcers Psychiatric:  Has normal mood and affect. Behavior is normal. ]    Assessment & Plan:

## 2015-10-16 NOTE — Assessment & Plan Note (Signed)
stable overall by history and exam, recent data reviewed with pt, and pt to continue medical treatment as before,  to f/u any worsening symptoms or concerns Lab Results  Component Value Date   LDLCALC 82 10/01/2014

## 2015-10-16 NOTE — Patient Instructions (Signed)
Please take all new medication as prescribed - the percocet  Please continue all other medications as before, and refills have been done if requested.  Please have the pharmacy call with any other refills you may need.  Please continue your efforts at being more active, low cholesterol diet, and weight control.  You are otherwise up to date with prevention measures today.  Please keep your appointments with your specialists as you may have planned  Please go to the LAB in the Basement (turn left off the elevator) for the tests to be done today  You will be contacted by phone if any changes need to be made immediately.  Otherwise, you will receive a letter about your results with an explanation, but please check with MyChart first.  Please remember to sign up for MyChart if you have not done so, as this will be important to you in the future with finding out test results, communicating by private email, and scheduling acute appointments online when needed.  Please return in 6 months, or sooner if needed, with Lab testing done 3-5 days before

## 2015-10-16 NOTE — Assessment & Plan Note (Signed)
Ran out of percocet, norco did not work as well previous, only asking for 1 qhs prn to get back to surgeon on Mon may 22, ok for rx  In addition to the time spent performing CPE, I spent an additional 40 minutes face to face,in which greater than 50% of this time was spent in counseling and coordination of care for patient's acute illness as documented.

## 2015-10-16 NOTE — Assessment & Plan Note (Signed)
stable overall by history and exam, recent data reviewed with pt, and pt to continue medical treatment as before,  to f/u any worsening symptoms or concerns BP Readings from Last 3 Encounters:  10/16/15 140/82  09/24/15 118/86  07/10/15 102/57

## 2015-10-16 NOTE — Assessment & Plan Note (Signed)

## 2015-10-17 ENCOUNTER — Telehealth: Payer: Self-pay | Admitting: Internal Medicine

## 2015-10-17 NOTE — Telephone Encounter (Signed)
States Michelle PlowmanJackie Morton a Human resources officersales representative with [employer] (patient does not know name of company)  will not take written script for diabetic shoes.  States company has sent a fax over to [name].  States you can reach the representative at [phone].

## 2015-12-04 ENCOUNTER — Other Ambulatory Visit: Payer: Self-pay | Admitting: Internal Medicine

## 2015-12-17 ENCOUNTER — Ambulatory Visit (INDEPENDENT_AMBULATORY_CARE_PROVIDER_SITE_OTHER): Payer: Medicare Other | Admitting: Internal Medicine

## 2015-12-17 ENCOUNTER — Encounter: Payer: Self-pay | Admitting: Internal Medicine

## 2015-12-17 ENCOUNTER — Ambulatory Visit (INDEPENDENT_AMBULATORY_CARE_PROVIDER_SITE_OTHER)
Admission: RE | Admit: 2015-12-17 | Discharge: 2015-12-17 | Disposition: A | Discharge status: Home / Self Care | Payer: Medicare Other | Source: Ambulatory Visit | Attending: Internal Medicine | Admitting: Internal Medicine

## 2015-12-17 ENCOUNTER — Other Ambulatory Visit (INDEPENDENT_AMBULATORY_CARE_PROVIDER_SITE_OTHER): Payer: Medicare Other

## 2015-12-17 VITALS — BP 128/76 | HR 94 | Temp 97.9°F | Resp 20 | Wt 145.0 lb

## 2015-12-17 DIAGNOSIS — N183 Chronic kidney disease, stage 3 unspecified: Secondary | ICD-10-CM

## 2015-12-17 DIAGNOSIS — R634 Abnormal weight loss: Secondary | ICD-10-CM

## 2015-12-17 DIAGNOSIS — E162 Hypoglycemia, unspecified: Secondary | ICD-10-CM | POA: Diagnosis not present

## 2015-12-17 DIAGNOSIS — G2581 Restless legs syndrome: Secondary | ICD-10-CM

## 2015-12-17 DIAGNOSIS — E083299 Diabetes mellitus due to underlying condition with mild nonproliferative diabetic retinopathy without macular edema, unspecified eye: Secondary | ICD-10-CM | POA: Diagnosis not present

## 2015-12-17 LAB — HEPATIC FUNCTION PANEL
ALBUMIN: 4.4 g/dL (ref 3.5–5.2)
ALK PHOS: 72 U/L (ref 39–117)
ALT: 6 U/L (ref 0–35)
AST: 13 U/L (ref 0–37)
BILIRUBIN DIRECT: 0.1 mg/dL (ref 0.0–0.3)
TOTAL PROTEIN: 8 g/dL (ref 6.0–8.3)
Total Bilirubin: 0.8 mg/dL (ref 0.2–1.2)

## 2015-12-17 LAB — BASIC METABOLIC PANEL
BUN: 47 mg/dL — ABNORMAL HIGH (ref 6–23)
CALCIUM: 9.7 mg/dL (ref 8.4–10.5)
CO2: 30 meq/L (ref 19–32)
CREATININE: 1.89 mg/dL — AB (ref 0.40–1.20)
Chloride: 100 mEq/L (ref 96–112)
GFR: 32.76 mL/min — AB (ref >60.00)
Glucose, Bld: 130 mg/dL — ABNORMAL HIGH (ref 70–99)
Potassium: 4 mEq/L (ref 3.5–5.1)
SODIUM: 140 meq/L (ref 135–145)

## 2015-12-17 LAB — LIPID PANEL
CHOL/HDL RATIO: 3
CHOLESTEROL: 175 mg/dL (ref 0–200)
HDL: 61.1 mg/dL (ref >39.00)
LDL Cholesterol: 97 mg/dL (ref 0–99)
NonHDL: 113.7
TRIGLYCERIDES: 82 mg/dL (ref 0.0–149.0)
VLDL: 16.4 mg/dL (ref 0.0–40.0)

## 2015-12-17 LAB — HEMOGLOBIN A1C: HEMOGLOBIN A1C: 6.3 % (ref 4.6–6.5)

## 2015-12-17 MED ORDER — TRAMADOL HCL 50 MG PO TABS: 50.0000 mg | ORAL_TABLET | Freq: Four times a day (QID) | ORAL | Status: DC | PRN

## 2015-12-17 NOTE — Patient Instructions (Addendum)
Ok to stop the [place]  Please continue all other medications as before, and refills have been done if requested - the tramadol for pain  Please have the pharmacy call with any other refills you may need.  Please keep your appointments with your specialists as you may have planned - Kidney doctor in September  Please go to the XRAY Department in the Basement (go straight as you get off the elevator) for the x-ray testing  Please go to the LAB in the Basement (turn left off the elevator) for the tests to be done today  You will be contacted by phone if any changes need to be made immediately.  Otherwise, you will receive a letter about your results with an explanation, but please check with MyChart first.  Please remember to sign up for MyChart if you have not done so, as this will be important to you in the future with finding out test results, communicating by private email, and scheduling acute appointments online when needed.  Please return in 3 months, or sooner if needed

## 2015-12-17 NOTE — Progress Notes (Signed)
Pre visit review using our clinic review tool, if applicable. No additional management support is needed unless otherwise documented below in the visit note. 

## 2015-12-17 NOTE — Assessment & Plan Note (Signed)
Etiology unclear, ? geriiatric decline vs CKD, doubt GI related, cant r/o malignancy - for cxr as she is former smoker

## 2015-12-17 NOTE — Assessment & Plan Note (Signed)
As above, cw over controlled, to d/c [place]uvia

## 2015-12-17 NOTE — Assessment & Plan Note (Addendum)
Overcontrolled;  Lab Results  Component Value Date   HGBA1C 7.1* 10/16/2015   Last a1c mild elevated, lost significant wt, has CKD ? worsening - ok to d/c januvia, cont monitor cbgs at home  Note:  Total time for pt hx, exam, review of record with pt in the room, determination of diagnoses and plan for further eval and tx is > 40 min, with over 50% spent in coordination and counseling of patient

## 2015-12-17 NOTE — Progress Notes (Signed)
Subjective:    Patient ID: Michelle Morton, female    DOB: [birth year], 82 y.o.   MRN: [id number]  HPI  Here to f/u; overall doing ok,  Pt denies chest pain, increasing sob or doe, wheezing, orthopnea, PND, increased LE swelling, palpitations, dizziness or syncope.  Pt denies new neurological symptoms such as new headache, or facial or extremity weakness or numbness.  Pt denies polydipsia, polyuria, ut numerpous low sugar episode with daily cbg's in the 80 and 90's., has to eat sometimes to feel better..   Pt denies new neurological symptoms such as new headache, or facial or extremity weakness or numbness.   Pt states overall good compliance with meds, mostly trying to follow appropriate diet, with wt overall down several lbs.  Legs seemed to jerk at night. Wt Readings from Last 3 Encounters:  12/17/15 145 lb (65.772 kg)  10/16/15 157 lb (71.215 kg)  07/10/15 152 lb (68.947 kg)  Not sure why losing wt, has been former smoker.  ? Geriatric decline, appetite is low, walks with walker now.  Denies hyper or hypo thyroid symptoms such as voice, skin or hair change. Is up to date with mammogram and colonoscopy, no hx of malignancy  Has regular renal fu in sept with BMP.    Also has legs jerking to get to sleep at night, also wakes her up occasionally, has known lumbar disc dz.  Denies worsening reflux, abd pain, dysphagia, n/v, bowel change or blood.  Asks for tramadol refill for chronic pain Past Medical History  Diagnosis Date  . Hypertension   . Arthus phenomenon   . Hyperlipidemia   . Diabetes mellitus, type 2 (HCC)   . Hypothyroidism   . Allergic rhinitis, cause unspecified 02/14/2013  . SVD (spontaneous vaginal delivery)     x 4  . Varicose veins     lower legs  . Osteoarthritis, knee     knees - otc med prn  . History of blood transfusion     [facility] - unsure number of units transfused  . Hearing loss     right ear, no hearing aid  . Arthritis   . Kidney stones    Past Surgical  History  Procedure Laterality Date  . Abdominal hysterectomy    . Replacement total knee Bilateral   . Tonsillectomy    . Wisdom tooth extraction    . Cataract extraction Bilateral   . Appendectomy    . Colonoscopy    . Kidney stone surgery      removal of stone  . Anterior and posterior repair N/A 04/17/2014    Procedure: ANTERIOR (CYSTOCELE) AND POSTERIOR REPAIR (RECTOCELE);  Surgeon: Lavina Hammanodd Meisinger, MD;  Location: [facility];  Service: Gynecology;  Laterality: N/A;    reports that she quit smoking about 23 years ago. Her smoking use included Cigarettes. She has a 16 pack-year smoking history. She has never used smokeless tobacco. She reports that she does not drink alcohol or use illicit drugs. family history includes Breast cancer in her daughter; Diabetes in her brother, father, mother, and sister; Hypertension in her father. There is no history of Colon cancer, Esophageal cancer, Pancreatic cancer, Liver disease, or Kidney disease.  Current Outpatient Prescriptions on File Prior to Visit  Medication Sig Dispense Refill  . diclofenac sodium (VOLTAREN) 1 % GEL Apply 4 g topically 4 (four) times daily. GENERIC OK (Patient taking differently: Apply 4 g topically 4 (four) times daily as needed (knee pain). GENERIC OK) 400 g  11  . fluticasone (FLONASE) 50 MCG/ACT nasal spray Place 2 sprays into both nostrils daily. 16 g 5  . furosemide (LASIX) 40 MG tablet Take 40 mg by mouth daily.    Marland Kitchen levothyroxine (SYNTHROID, LEVOTHROID) 88 MCG tablet TAKE ONE TABLET BY MOUTH ONCE DAILY 90 tablet 0  . oxyCODONE-acetaminophen (PERCOCET/ROXICET) 5-325 MG tablet Take 1 tablet by mouth at bedtime as needed for severe pain. 5 tablet 0  . pravastatin (PRAVACHOL) 40 MG tablet TAKE ONE TABLET BY MOUTH ONCE DAILY 30 tablet 2   No current facility-administered medications on file prior to visit.   Review of Systems  Constitutional: Negative for unusual diaphoresis or night sweats HENT: Negative for ear swelling  or discharge Eyes: Negative for worsening visual haziness  Respiratory: Negative for choking and stridor.   Gastrointestinal: Negative for distension or worsening eructation Genitourinary: Negative for retention or change in urine volume.  Musculoskeletal: Negative for other MSK pain or swelling Skin: Negative for color change and worsening wound Neurological: Negative for tremors and numbness other than noted  Psychiatric/Behavioral: Negative for decreased concentration or agitation other than above       Objective:   Physical Exam BP 128/76 mmHg  Pulse 94  Temp(Src) 97.9 F (36.6 C) (Oral)  Resp 20  Wt 145 lb (65.772 kg)  SpO2 96% VS noted, thinner, visibly lost wt, walks with walker Constitutional: Pt appears in no apparent distress HENT: Head: NCAT.  Right Ear: External ear normal.  Left Ear: External ear normal.  Eyes: . Pupils are equal, round, and reactive to light. Conjunctivae and EOM are normal Neck: Normal range of motion. Neck supple.  Cardiovascular: Normal rate and regular rhythm.   Pulmonary/Chest: Effort normal and breath sounds without rales or wheezing.  Abd:  Soft, NT, ND, + BS Neurological: Pt is alert. Not confused , motor grossly intact Skin: Skin is warm. No rash, no LE edema Psychiatric: Pt behavior is normal. No agitation.     Assessment & Plan:

## 2015-12-17 NOTE — Assessment & Plan Note (Signed)
Not clear if worsening to me as last cr were trending up, for f/u cr today,  to f/u any worsening symptoms or concerns, f/u renal in sept as planned

## 2015-12-17 NOTE — Assessment & Plan Note (Signed)
Mild, for gabapentin 300 qhs,  to f/u any worsening symptoms or concerns

## 2016-01-13 ENCOUNTER — Telehealth: Payer: Self-pay | Admitting: Internal Medicine

## 2016-01-13 NOTE — Telephone Encounter (Signed)
Patient called to ask if it is ok for her to take advil for her knees. She states that tramadol isnt working.   Please mail patient her most recent lab results.

## 2016-01-13 NOTE — Telephone Encounter (Signed)
Called and spoke to patient, she stated that someone else had already called and given the patient the advisement. Not sure who did so I will document that I called patient and she was already aware.

## 2016-01-13 NOTE — Telephone Encounter (Signed)
I would not try this, as although she might be able to tolerate it regarding the kidneys, there is at least a fair chance of further slowing of her kidneys with this medication over what she has already.  Tylenol is safer and can be taken at ES tylenol 650 mg - 1 every 8 hrs .  Otherwise I would see Dr Katrinka BlazingSmith Margaretmary Bayley/sports medicine in this office if not already seeing orthopedic for this

## 2016-01-13 NOTE — Telephone Encounter (Signed)
Notified pt w/MD response. Pt states she inform MD that she is already taking the EX Tylenol when the tramadol stop working. She stated that he was going to rx something for muscle spasm in her legs at night, but he only sent the Tramadol. She states she would like for him to send in the muscle spasm med, and she will continue taking the tramadol/tylenol, and using ice/heat, and if no better she will call to make appt w/Dr. Katrinka BlazingSmith. Pls advise on sending something for muscle spasm...Raechel Chute/lmb

## 2016-01-14 ENCOUNTER — Telehealth: Payer: Self-pay | Admitting: *Deleted

## 2016-01-14 MED ORDER — TIZANIDINE HCL 4 MG PO TABS
4.0000 mg | ORAL_TABLET | Freq: Four times a day (QID) | ORAL | 1 refills | Status: DC | PRN
Start: 2016-01-14 — End: 2016-12-10

## 2016-01-14 NOTE — Telephone Encounter (Signed)
Ok, this has been done 

## 2016-01-14 NOTE — Telephone Encounter (Signed)
Pt states she inform MD that she is already taking the EX Tylenol when the tramadol stop working. She stated that he was going to rx something for muscle spasm in her legs at night, but he only sent the Tramadol. She states she would like for him to send in the muscle spasm med, and she will continue taking the tramadol/tylenol, and using ice/heat, and if no better she will call to make appt w/Dr. Katrinka BlazingSmith. Pls advise on sending something for muscle spasm...Raechel Chute/lmb

## 2016-01-14 NOTE — Telephone Encounter (Signed)
Notified pt MD sent muscle relaxant to [facility]...Raechel Chute/lmb

## 2016-01-31 ENCOUNTER — Other Ambulatory Visit: Payer: Self-pay | Admitting: Internal Medicine

## 2016-02-19 ENCOUNTER — Other Ambulatory Visit: Payer: Self-pay | Admitting: Internal Medicine

## 2016-02-19 ENCOUNTER — Ambulatory Visit (INDEPENDENT_AMBULATORY_CARE_PROVIDER_SITE_OTHER)
Admission: RE | Admit: 2016-02-19 | Discharge: 2016-02-19 | Disposition: A | Discharge status: Home / Self Care | Payer: Medicare Other | Source: Ambulatory Visit | Attending: Internal Medicine | Admitting: Internal Medicine

## 2016-02-19 ENCOUNTER — Ambulatory Visit (INDEPENDENT_AMBULATORY_CARE_PROVIDER_SITE_OTHER): Payer: Medicare Other | Admitting: Internal Medicine

## 2016-02-19 ENCOUNTER — Encounter: Payer: Self-pay | Admitting: Internal Medicine

## 2016-02-19 ENCOUNTER — Telehealth: Payer: Self-pay

## 2016-02-19 VITALS — BP 120/76 | HR 80 | Temp 98.1°F | Resp 20 | Wt 149.0 lb

## 2016-02-19 DIAGNOSIS — R079 Chest pain, unspecified: Secondary | ICD-10-CM

## 2016-02-19 DIAGNOSIS — E083299 Diabetes mellitus due to underlying condition with mild nonproliferative diabetic retinopathy without macular edema, unspecified eye: Secondary | ICD-10-CM | POA: Diagnosis not present

## 2016-02-19 DIAGNOSIS — I10 Essential (primary) hypertension: Secondary | ICD-10-CM | POA: Diagnosis not present

## 2016-02-19 MED ORDER — HYDROCODONE-ACETAMINOPHEN 5-325 MG PO TABS: 1.0000 | ORAL_TABLET | Freq: Four times a day (QID) | ORAL | 0 refills | Status: DC | PRN

## 2016-02-19 NOTE — Progress Notes (Signed)
Pre visit review using our clinic review tool, if applicable. No additional management support is needed unless otherwise documented below in the visit note. 

## 2016-02-19 NOTE — Telephone Encounter (Signed)
I recd call from sierra/radiology----she is advising that dr herman/radiologist is calling report ASAP to dr Jonny Ruizjohn on this patient----test is DG Ribs unil. Right---I have verbally given this telephone note to dr Jonny Ruizjohn and he is to look at test

## 2016-02-19 NOTE — Telephone Encounter (Signed)
Patient would like to know if we have any glucose testing meters.  If not she is requesting one to be sent to her pharmacy, [facility] [address] Patient does not know what meter she currently has.

## 2016-02-19 NOTE — Patient Instructions (Signed)
Please take all new medication as prescribed - the hydrocodone as needed  Please continue all other medications as before, and refills have been done if requested.  Please have the pharmacy call with any other refills you may need.  Please keep your appointments with your specialists as you may have planned  Please go to the XRAY Department in the Basement (go straight as you get off the elevator) for the x-ray testing  You will be contacted by phone if any changes need to be made immediately.  Otherwise, you will receive a letter about your results with an explanation, but please check with MyChart first.  Please remember to sign up for MyChart if you have not done so, as this will be important to you in the future with finding out test results, communicating by private email, and scheduling acute appointments online when needed.

## 2016-02-20 MED ORDER — LANCETS MISC: 12 refills | Status: DC

## 2016-02-20 MED ORDER — GLUCOSE BLOOD VI STRP: ORAL_STRIP | 12 refills | Status: DC

## 2016-02-20 MED ORDER — ONETOUCH ULTRA 2 W/DEVICE KIT: PACK | 0 refills | Status: DC

## 2016-02-20 NOTE — Telephone Encounter (Signed)
Pt informed that erx has been done and sent to [facility].

## 2016-02-20 NOTE — Telephone Encounter (Signed)
Glucometer device, strips and lancets are sent to [facility]

## 2016-02-21 NOTE — Assessment & Plan Note (Signed)
After near fall due to stumble per pt, suspect msk strain, cant r/o fx, for films and pain control,  to f/u any worsening symptoms or concerns, does not appear to need PT at this time

## 2016-02-21 NOTE — Assessment & Plan Note (Signed)
stable overall by history and exam, recent data reviewed with pt, and pt to continue medical treatment as before,  to f/u any worsening symptoms or concerns BP Readings from Last 3 Encounters:  02/19/16 120/76  12/17/15 128/76  10/16/15 140/82

## 2016-02-21 NOTE — Progress Notes (Signed)
Subjective:    Patient ID: Michelle Morton, female    DOB: 1932-03-17, [age] y.o.   MRN: [id number]  HPI  Here to f/u, c/o 4 days right anterior lower CP points to the costal margain , sharp and pleuritic, mod to occas severe, occurred after a stumble and near fall saved by ending up with right chest caught on the walker.  No bruising or swelling, Nothing else makes better or worse.  Has not tolerated tramadol well in the past.  DId have a second fall a few days ago getting out of the car and fell in grass but no injury.  No fever, ST, cough or wheezing  Pt denies new neurological symptoms such as new headache, or facial or extremity weakness or numbness   Pt denies polydipsia, polyuria,   Past Medical History:  Diagnosis Date  . Allergic rhinitis, cause unspecified 02/14/2013  . Arthritis   . Arthus phenomenon   . Diabetes mellitus, type 2 (HCC)   . Hearing loss    right ear, no hearing aid  . History of blood transfusion    [facility] - unsure number of units transfused  . Hyperlipidemia   . Hypertension   . Hypothyroidism   . Kidney stones   . Osteoarthritis, knee    knees - otc med prn  . SVD (spontaneous vaginal delivery)    x 4  . Varicose veins    lower legs   Past Surgical History:  Procedure Laterality Date  . ABDOMINAL HYSTERECTOMY    . ANTERIOR AND POSTERIOR REPAIR N/A 04/17/2014   Procedure: ANTERIOR (CYSTOCELE) AND POSTERIOR REPAIR (RECTOCELE);  Surgeon: Lavina Hammanodd Meisinger, MD;  Location: [facility];  Service: Gynecology;  Laterality: N/A;  . APPENDECTOMY    . CATARACT EXTRACTION Bilateral   . COLONOSCOPY    . KIDNEY STONE SURGERY     removal of stone  . REPLACEMENT TOTAL KNEE Bilateral   . TONSILLECTOMY    . WISDOM TOOTH EXTRACTION      reports that she quit smoking about 23 years ago. Her smoking use included Cigarettes. She has a 16.00 pack-year smoking history. She has never used smokeless tobacco. She reports that she does not drink alcohol or use drugs. family history  includes Breast cancer in her daughter; Diabetes in her brother, father, mother, and sister; Hypertension in her father. Allergies  Allergen Reactions  . Penicillins Rash    Allergic to IV penicillin but tolerates the oral form Has patient had a PCN reaction causing immediate rash, facial/tongue/throat swelling, SOB or lightheadedness with hypotension: no Has patient had a PCN reaction causing severe rash involving mucus membranes or skin necrosis: no Has patient had a PCN reaction that required hospitalization already in the hospital for other procedure  Has patient had a PCN reaction occurring within the last 10 years: no If all of the above answers are "NO", then ma   Current Outpatient Prescriptions on File Prior to Visit  Medication Sig Dispense Refill  . diclofenac sodium (VOLTAREN) 1 % GEL Apply 4 g topically 4 (four) times daily. GENERIC OK (Patient taking differently: Apply 4 g topically 4 (four) times daily as needed (knee pain). GENERIC OK) 400 g 11  . fluticasone (FLONASE) 50 MCG/ACT nasal spray Place 2 sprays into both nostrils daily. 16 g 5  . furosemide (LASIX) 40 MG tablet Take 40 mg by mouth daily.    Marland Kitchen. levothyroxine (SYNTHROID, LEVOTHROID) 88 MCG tablet TAKE ONE TABLET BY MOUTH ONCE DAILY 90 tablet  0  . oxyCODONE-acetaminophen (PERCOCET/ROXICET) 5-325 MG tablet Take 1 tablet by mouth at bedtime as needed for severe pain. 5 tablet 0  . pravastatin (PRAVACHOL) 40 MG tablet TAKE ONE TABLET BY MOUTH ONCE DAILY 30 tablet 2  . tiZANidine (ZANAFLEX) 4 MG tablet Take 1 tablet (4 mg total) by mouth every 6 (six) hours as needed for muscle spasms. 40 tablet 1  . traMADol (ULTRAM) 50 MG tablet Take 1 tablet (50 mg total) by mouth every 6 (six) hours as needed. 120 tablet 2   No current facility-administered medications on file prior to visit.    Review of Systems  Constitutional: Negative for unusual diaphoresis or night sweats HENT: Negative for ear swelling or discharge Eyes:  Negative for worsening visual haziness  Respiratory: Negative for choking and stridor.   Gastrointestinal: Negative for distension or worsening eructation Genitourinary: Negative for retention or change in urine volume.  Musculoskeletal: Negative for other MSK pain or swelling Skin: Negative for color change and worsening wound Neurological: Negative for tremors and numbness other than noted  Psychiatric/Behavioral: Negative for decreased concentration or agitation other than above       Objective:   Physical Exam BP 120/76   Pulse 80   Temp 98.1 F (36.7 C) (Oral)   Resp 20   Wt 149 lb (67.6 kg)   SpO2 94%   BMI 25.18 kg/m  VS noted,  Constitutional: Pt appears in no apparent distress HENT: Head: NCAT.  Right Ear: External ear normal.  Left Ear: External ear normal.  Eyes: . Pupils are equal, round, and reactive to light. Conjunctivae and EOM are normal Neck: Normal range of motion. Neck supple.  Cardiovascular: Normal rate and regular rhythm.   Pulmonary/Chest: Effort normal and breath sounds without rales or wheezing.  Abd:  Soft, NT, ND, + BS Neurological: Pt is alert. Not confused , motor grossly intact Skin: Skin is warm. No rash, no LE edema Psychiatric: Pt behavior is normal. No agitation.  Right costal margin marked tender at the right anterior axillary line, without bruise or sweling    Assessment & Plan:

## 2016-02-21 NOTE — Assessment & Plan Note (Signed)
stable overall by history and exam, recent data reviewed with pt, and pt to continue medical treatment as before,  to f/u any worsening symptoms or concerns Lab Results  Component Value Date   HGBA1C 6.3 12/17/2015

## 2016-02-25 ENCOUNTER — Emergency Department (HOSPITAL_COMMUNITY)
Admission: EM | Admit: 2016-02-25 | Discharge: 2016-02-25 | Disposition: A | Discharge status: Home / Self Care | Payer: Medicare Other | Attending: Emergency Medicine | Admitting: Emergency Medicine

## 2016-02-25 ENCOUNTER — Emergency Department (HOSPITAL_COMMUNITY): Payer: Medicare Other

## 2016-02-25 ENCOUNTER — Encounter (HOSPITAL_COMMUNITY): Payer: Self-pay

## 2016-02-25 ENCOUNTER — Telehealth: Payer: Self-pay | Admitting: Internal Medicine

## 2016-02-25 DIAGNOSIS — Z79899 Other long term (current) drug therapy: Secondary | ICD-10-CM | POA: Insufficient documentation

## 2016-02-25 DIAGNOSIS — E039 Hypothyroidism, unspecified: Secondary | ICD-10-CM | POA: Diagnosis not present

## 2016-02-25 DIAGNOSIS — N183 Chronic kidney disease, stage 3 (moderate): Secondary | ICD-10-CM | POA: Insufficient documentation

## 2016-02-25 DIAGNOSIS — R531 Weakness: Secondary | ICD-10-CM | POA: Diagnosis not present

## 2016-02-25 DIAGNOSIS — I129 Hypertensive chronic kidney disease with stage 1 through stage 4 chronic kidney disease, or unspecified chronic kidney disease: Secondary | ICD-10-CM | POA: Insufficient documentation

## 2016-02-25 DIAGNOSIS — Z87891 Personal history of nicotine dependence: Secondary | ICD-10-CM | POA: Insufficient documentation

## 2016-02-25 DIAGNOSIS — E1122 Type 2 diabetes mellitus with diabetic chronic kidney disease: Secondary | ICD-10-CM | POA: Insufficient documentation

## 2016-02-25 DIAGNOSIS — Z7951 Long term (current) use of inhaled steroids: Secondary | ICD-10-CM | POA: Insufficient documentation

## 2016-02-25 LAB — URINALYSIS, ROUTINE W REFLEX MICROSCOPIC
Bilirubin Urine: NEGATIVE
Glucose, UA: NEGATIVE mg/dL
Hgb urine dipstick: NEGATIVE
KETONES UR: NEGATIVE mg/dL
LEUKOCYTES UA: NEGATIVE
NITRITE: NEGATIVE
PROTEIN: NEGATIVE mg/dL
Specific Gravity, Urine: 1.011 (ref 1.005–1.030)
pH: 7 (ref 5.0–8.0)

## 2016-02-25 LAB — CBC
HCT: 38.3 % (ref 36.0–46.0)
Hemoglobin: 12 g/dL (ref 12.0–15.0)
MCH: 29.5 pg (ref 26.0–34.0)
MCHC: 31.3 g/dL (ref 30.0–36.0)
MCV: 94.1 fL (ref 78.0–100.0)
PLATELETS: 236 10*3/uL (ref 150–400)
RBC: 4.07 MIL/uL (ref 3.87–5.11)
RDW: 14 % (ref 11.5–15.5)
WBC: 5.4 10*3/uL (ref 4.0–10.5)

## 2016-02-25 LAB — CBG MONITORING, ED: Glucose-Capillary: 353 mg/dL — ABNORMAL HIGH (ref 65–99)

## 2016-02-25 LAB — BASIC METABOLIC PANEL
Anion gap: 7 (ref 5–15)
BUN: 51 mg/dL — AB (ref 6–20)
CO2: 29 mmol/L (ref 22–32)
CREATININE: 2.05 mg/dL — AB (ref 0.44–1.00)
Calcium: 9 mg/dL (ref 8.9–10.3)
Chloride: 104 mmol/L (ref 101–111)
GFR calc Af Amer: 25 mL/min — ABNORMAL LOW (ref >60)
GFR, EST NON AFRICAN AMERICAN: 21 mL/min — AB (ref >60)
Glucose, Bld: 261 mg/dL — ABNORMAL HIGH (ref 65–99)
POTASSIUM: 4.2 mmol/L (ref 3.5–5.1)
SODIUM: 140 mmol/L (ref 135–145)

## 2016-02-25 MED ORDER — SODIUM CHLORIDE 0.9 % IV BOLUS (SEPSIS)
1000.0000 mL | Freq: Once | INTRAVENOUS | Status: AC
  Administered 2016-02-25: 1000 mL via INTRAVENOUS

## 2016-02-25 MED ORDER — SODIUM CHLORIDE 0.9 % IV BOLUS (SEPSIS): 1000.0000 mL | Freq: Once | INTRAVENOUS | Status: DC

## 2016-02-25 NOTE — ED Provider Notes (Signed)
[facility] DEPT Provider Note   CSN: [id number] Arrival date & time: 02/25/16  1426     History   Chief Complaint Chief Complaint  Patient presents with  . Weakness  . Urinary Frequency    HPI Michelle Morton is a 82 y.o. female.  HPI She presents with her daughter who assists with the history of present illness. Patient is presenting essentially with concern for weakness, unsteadiness, and concern for impending fall. She has a notable history of multiple falls about 2 weeks ago, has ongoing pain in multiple areas from these episodes. Patient felt unsteady, and fell striking her left lower chest wall  Substance connectors did not demonstrate rbfracture. Howeve, the patient ha ongoing pain I her left chest wall, as well as in her left leg. She has been a new territory, but notes that she is hesitant to move substantially secondary to concern for fall. She denies lightheadedness, confusion, disorientation, chest pain, belly pain, nausea, vomiting, fever, chills. She does acknowledge ongoing generalized weakness.   Subsequently the patient begins to describe episode of speech difficulty yesterday, lower extremity weakness yesterday, but none currently.  She recalls that she was started on a new medication by her urologist recently, and over the past few days has been taking oxycodone for pain relief as well.   Past Medical History:  Diagnosis Date  . Allergic rhinitis, cause unspecified 02/14/2013  . Arthritis   . Arthus phenomenon   . Diabetes mellitus, type 2 ([place])   . Hearing loss    right ear, no hearing aid  . History of blood transfusion    [name] - unsure number of units transfused  . Hyperlipidemia   . Hypertension   . Hypothyroidism   . Kidney stones   . Osteoarthritis, knee    knees - otc med prn  . SVD (spontaneous vaginal delivery)    x 4  . Varicose veins    lower legs    Patient Active Problem List   Diagnosis Date Noted  . Hypoglycemia  12/17/2015  . RLS (restless legs syndrome) 12/17/2015  . Weight loss 12/17/2015  . Back pain 10/16/2015  . Right-sided chest pain 03/27/2015  . Diarrhea 03/27/2015  . Hoarseness 03/27/2015  . Loss of weight 03/27/2015  . Depression 01/07/2015  . CKD (chronic kidney disease), stage III 01/07/2015  . AKI (acute kidney injury) ([place]) 10/09/2014  . Uterine prolaps 09/01/2013  . Peripheral edema 02/14/2013  . Allergic rhinitis 02/14/2013  . Obesity, Class II, BMI 35-39.9 05/12/2011  . Encounter for preventative adult health care exam with abnormal findings 05/12/2011  . TINNITUS 01/09/2009  . Essential hypertension 05/05/2007  . Hypothyroidism 02/28/2007  . Diabetes ([place]) 02/28/2007  . Hyperlipidemia 02/28/2007  . Osteoarthrosis, unspecified whether generalized or localized, involving lower leg 02/28/2007    Past Surgical History:  Procedure Laterality Date  . ABDOMINAL HYSTERECTOMY    . ANTERIOR AND POSTERIOR REPAIR N/A 04/17/2014   Procedure: ANTERIOR (CYSTOCELE) AND POSTERIOR REPAIR (RECTOCELE);  Surgeon: Cheri Fowler, MD;  Location: [facility];  Service: Gynecology;  Laterality: N/A;  . APPENDECTOMY    . CATARACT EXTRACTION Bilateral   . COLONOSCOPY    . KIDNEY STONE SURGERY     removal of stone  . REPLACEMENT TOTAL KNEE Bilateral   . TONSILLECTOMY    . WISDOM TOOTH EXTRACTION      OB History    No data available       Home Medications    Prior to Admission medications  Medication Sig Start Date End Date Taking? Authorizing Provider  Blood Glucose Monitoring Suppl (ONE TOUCH ULTRA 2) w/Device KIT Use as directed once per day 02/20/16   Biagio Borg, MD  calcitonin, salmon, (MIACALCIN/FORTICAL) 200 UNIT/ACT nasal spray Place 1 spray into alternate nostrils daily.    Historical Provider, MD  diclofenac sodium (VOLTAREN) 1 % GEL Apply 4 g topically 4 (four) times daily. GENERIC OK Patient taking differently: Apply 4 g topically 4 (four) times daily as needed (knee pain).  GENERIC OK 01/07/15   Biagio Borg, MD  fluticasone (FLONASE) 50 MCG/ACT nasal spray Place 2 sprays into both nostrils daily. 05/30/14   Biagio Borg, MD  furosemide (LASIX) 40 MG tablet Take 40 mg by mouth daily.    Historical Provider, MD  glucose blood (ONE TOUCH ULTRA TEST) test strip Use as instructed 02/20/16   Biagio Borg, MD  HYDROcodone-acetaminophen (NORCO/VICODIN) 5-325 MG tablet Take 1 tablet by mouth every 6 (six) hours as needed for moderate pain. 02/19/16   Biagio Borg, MD  Lancets MISC Use as directed once per day 02/20/16   Biagio Borg, MD  levothyroxine (SYNTHROID, LEVOTHROID) 51 MCG tablet TAKE ONE TABLET BY MOUTH ONCE DAILY 12/04/15   Biagio Borg, MD  oxyCODONE-acetaminophen (PERCOCET/ROXICET) 5-325 MG tablet Take 1 tablet by mouth at bedtime as needed for severe pain. 10/16/15   Biagio Borg, MD  pravastatin (PRAVACHOL) 40 MG tablet TAKE ONE TABLET BY MOUTH ONCE DAILY 02/03/16   Biagio Borg, MD  tiZANidine (ZANAFLEX) 4 MG tablet Take 1 tablet (4 mg total) by mouth every 6 (six) hours as needed for muscle spasms. 01/14/16   Biagio Borg, MD  traMADol (ULTRAM) 50 MG tablet Take 1 tablet (50 mg total) by mouth every 6 (six) hours as needed. 12/17/15   Biagio Borg, MD    Family History Family History  Problem Relation Age of Onset  . Diabetes Mother   . Diabetes Father   . Hypertension Father   . Breast cancer Daughter   . Diabetes Brother     x 1  . Diabetes Sister     x 4  . Colon cancer Neg Hx   . Esophageal cancer Neg Hx   . Pancreatic cancer Neg Hx   . Liver disease Neg Hx   . Kidney disease Neg Hx     Social History Social History  Substance Use Topics  . Smoking status: Former Smoker    Packs/day: 1.00    Years: 16.00    Types: Cigarettes    Quit date: 05/10/1992  . Smokeless tobacco: Never Used  . Alcohol use No     Allergies   Penicillins   Review of Systems Review of Systems  Constitutional:       Per HPI, otherwise negative  HENT:       Per  HPI, otherwise negative  Respiratory:       Per HPI, otherwise negative  Cardiovascular:       Per HPI, otherwise negative  Gastrointestinal: Negative for vomiting.  Endocrine:       Negative aside from HPI  Genitourinary:       Neg aside from HPI   Musculoskeletal:       Per HPI, otherwise negative  Skin: Negative.   Neurological: Positive for weakness. Negative for syncope.     Physical Exam Updated Vital Signs BP 154/77 (BP Location: Right Arm)   Pulse 96   Temp 98.2  F (36.8 C) (Oral)   Resp 14   SpO2 98%   Physical Exam  Constitutional: She is oriented to person, place, and time. She has a sickly appearance. No distress.  HENT:  Head: Normocephalic and atraumatic.  Eyes: Conjunctivae and EOM are normal.  Cardiovascular: Normal rate and regular rhythm.   Pulmonary/Chest: Effort normal and breath sounds normal. No stridor. No respiratory distress.  Abdominal: She exhibits no distension.  Musculoskeletal: She exhibits no edema.       Right hip: Normal.       Arms:      Legs: Neurological: She is alert and oriented to person, place, and time. She displays atrophy. No cranial nerve deficit.  Skin: Skin is warm and dry.  Psychiatric: She has a normal mood and affect.  Nursing note and vitals reviewed.    ED Treatments / Results  Labs (all labs ordered are listed, but only abnormal results are displayed) Labs Reviewed  CBG MONITORING, ED - Abnormal; Notable for the following:       Result Value   Glucose-Capillary 353 (*)    All other components within normal limits  BASIC METABOLIC PANEL  CBC  URINALYSIS, ROUTINE W REFLEX MICROSCOPIC (NOT AT [facility])    EKG  EKG Interpretation  Date/Time:  Wednesday February 25 2016 15:14:06 EDT Ventricular Rate:  97 PR Interval:    QRS Duration: 84 QT Interval:  356 QTC Calculation: 453 R Axis:   25 Text Interpretation:  Sinus rhythm Low voltage, precordial leads Anteroseptal infarct, old T wave abnormality Borderline  ECG Confirmed by Carmin Muskrat  MD [phone]) on 02/25/2016 3:35:24 PM       Radiology No results found.  Procedures Procedures (including critical care time)  Medications Ordered in ED Medications  sodium chloride 0.9 % bolus 1,000 mL (not administered)    Chart review notable for primary care visit last week, x-rays, not demonstrating fracture that time. Initial Impression / Assessment and Plan / ED Course  I have reviewed the triage vital signs and the nursing notes.  Pertinent labs & imaging results that were available during my care of the patient were reviewed by me and considered in my medical decision making (see chart for details).  Clinical Course    6:23 PM On repeat exam the patient is in no distress, awake, she is aware of all findings, she is here with multiple family members they also are aware of patient's findings per I discussed the importance of following up with primary care, stay well-hydrated.  Final Clinical Impressions(s) / ED Diagnoses  He presents with dizziness, concern for unsteadiness. Here the patient is awake and alert, appropriately interactive, with no focal neurologic deficits. Patient did have recent fall, and x-rays were performed to exclude previously: Fracture, these were not notable. Patient also described episode of speech difficult yesterday, perceived by family members, but not by her. Given discussed the findings, there is some suspicion for iatrogenic, medication effects given her recent initiation of opiates and new urologic medication. Evaluation was reassuring, with no evidence for sepsis, bacteremia, UTI, pneumonia, fracture. Patient discharged with primary care follow-up, close return precautions.   Carmin Muskrat, MD 02/25/16 [phone]

## 2016-02-25 NOTE — ED Notes (Signed)
MD at bedside. 

## 2016-02-25 NOTE — ED Triage Notes (Addendum)
Per EMS, Pt, from home, c/o generalized weakness starting today, urinary frequency starting last night, and low back pain r/t fall x 2 weeks.  EMS reports Pt was able to stand from wheelchair and position herself on the stretcher.  Pt was seen by PCP ([facility]) after recent fall and xrays were completed.      Pt and family reports that she was started on a new medication by Urologist recently.  Pt unable to tell this writer the medication name.

## 2016-02-25 NOTE — ED Notes (Signed)
Patient was alert, oriented and stable upon discharge. RN went over AVS and patient had no further questions.  

## 2016-02-25 NOTE — Discharge Instructions (Signed)
As discussed, your evaluation today has been largely reassuring.  But, it is important that you monitor your condition carefully, and do not hesitate to return to the ED if you develop new, or concerning changes in your condition.  Please try to minimize the use of opiate medication stay well hydrated, and follow-up with your primary care physician as soon as possible.

## 2016-02-25 NOTE — ED Notes (Signed)
Urine walked down to lab; hand delivered to lab.

## 2016-02-25 NOTE — ED Notes (Signed)
Upon further assessment, Pt reports "slurred speech" and feeling like she couldn't move her legs" x "half the day" yesterday.

## 2016-03-04 ENCOUNTER — Ambulatory Visit (INDEPENDENT_AMBULATORY_CARE_PROVIDER_SITE_OTHER): Payer: Medicare Other | Admitting: Internal Medicine

## 2016-03-04 VITALS — BP 138/68 | HR 88 | Temp 98.4°F | Resp 20 | Wt 146.5 lb

## 2016-03-04 DIAGNOSIS — I10 Essential (primary) hypertension: Secondary | ICD-10-CM

## 2016-03-04 DIAGNOSIS — E083299 Diabetes mellitus due to underlying condition with mild nonproliferative diabetic retinopathy without macular edema, unspecified eye: Secondary | ICD-10-CM

## 2016-03-04 DIAGNOSIS — R531 Weakness: Secondary | ICD-10-CM

## 2016-03-04 DIAGNOSIS — N184 Chronic kidney disease, stage 4 (severe): Secondary | ICD-10-CM

## 2016-03-04 DIAGNOSIS — W19XXXD Unspecified fall, subsequent encounter: Secondary | ICD-10-CM

## 2016-03-04 MED ORDER — SITAGLIPTIN PHOSPHATE 50 MG PO TABS: 50.0000 mg | ORAL_TABLET | Freq: Every day | ORAL | 3 refills | Status: DC

## 2016-03-04 NOTE — Assessment & Plan Note (Addendum)
stable overall by history and exam, recent data reviewed with pt, and pt to continue medical treatment as before,  to f/u any worsening symptoms or concerns Lab Results  Component Value Date   CREATININE 2.05 (H) 02/25/2016   To cont regular f/u with renal as planned

## 2016-03-04 NOTE — Progress Notes (Signed)
Subjective:    Patient ID: Michelle Morton, female    DOB: Jun 06, 1931, 82 y.o.   MRN: [id number]  HPI  Here to f/u, was seen recetnly in ED for elev BP at home then weakness and feeling dizzy, somewhat unsteady, exam and evaluation there neg for acute; felt better after IVF's. Pt has gotten to the point she feels she may need NHP due to weakness, ? mild memory issues, and CKD-4 as well as other comorbids.  Lives who daugther who works so unable to be there at all times.  Has not been able to find someone to stay with her during the day.  Pt denies chest pain, increased sob or doe, wheezing, orthopnea, PND, increased LE swelling, palpitations.   Pt denies polydipsia, polyuria, or low sugar symptoms.  Pt states overall good compliance with meds, and in fact has restarted the previous [place] due to recent elev BS to 200's.  Has appt oct 10 with Dr Dutch Gray for right hip pain worsening recenlty, and is hoping for PT there as well. Does not currently have soc services, home health or [facility] involved  Wants to be in NH to avoid being a burden to the family.  Denies worsening depressive symptoms, suicidal ideation, or panic, though appears to be somewhat sad today Past Medical History:  Diagnosis Date  . Allergic rhinitis, cause unspecified 02/14/2013  . Arthritis   . Arthus phenomenon   . Diabetes mellitus, type 2 ([place])   . Hearing loss    right ear, no hearing aid  . History of blood transfusion    [name] - unsure number of units transfused  . Hyperlipidemia   . Hypertension   . Hypothyroidism   . Kidney stones   . Osteoarthritis, knee    knees - otc med prn  . SVD (spontaneous vaginal delivery)    x 4  . Varicose veins    lower legs   Past Surgical History:  Procedure Laterality Date  . ABDOMINAL HYSTERECTOMY    . ANTERIOR AND POSTERIOR REPAIR N/A 04/17/2014   Procedure: ANTERIOR (CYSTOCELE) AND POSTERIOR REPAIR (RECTOCELE);  Surgeon: Cheri Fowler, MD;  Location: [facility];  Service:  Gynecology;  Laterality: N/A;  . APPENDECTOMY    . CATARACT EXTRACTION Bilateral   . COLONOSCOPY    . KIDNEY STONE SURGERY     removal of stone  . REPLACEMENT TOTAL KNEE Bilateral   . TONSILLECTOMY    . WISDOM TOOTH EXTRACTION      reports that she quit smoking about 23 years ago. Her smoking use included Cigarettes. She has a 16.00 pack-year smoking history. She has never used smokeless tobacco. She reports that she does not drink alcohol or use drugs. family history includes Breast cancer in her daughter; Diabetes in her brother, father, mother, and sister; Hypertension in her father. Allergies  Allergen Reactions  . Penicillins Rash    Allergic to IV penicillin but tolerates the oral form Has patient had a PCN reaction causing immediate rash, facial/tongue/throat swelling, SOB or lightheadedness with hypotension: no Has patient had a PCN reaction causing severe rash involving mucus membranes or skin necrosis: no Has patient had a PCN reaction that required hospitalization already in the hospital for other procedure  Has patient had a PCN reaction occurring within the last 10 years: no If all of the above answers are "NO", then ma   Current Outpatient Prescriptions on File Prior to Visit  Medication Sig Dispense Refill  . Blood Glucose Monitoring  Suppl (ONE TOUCH ULTRA 2) w/Device KIT Use as directed once per day 1 each 0  . calcitonin, salmon, (MIACALCIN/FORTICAL) 200 UNIT/ACT nasal spray Place 1 spray into alternate nostrils daily as needed (allergies).     . calcitRIOL (ROCALTROL) 0.25 MCG capsule Take 0.25 mcg by mouth every Monday, Wednesday, and Friday.    . diclofenac sodium (VOLTAREN) 1 % GEL Apply 4 g topically 4 (four) times daily. GENERIC OK (Patient taking differently: Apply 4 g topically 4 (four) times daily as needed (knee pain). GENERIC OK) 400 g 11  . fluticasone (FLONASE) 50 MCG/ACT nasal spray Place 2 sprays into both nostrils daily. 16 g 5  . furosemide (LASIX) 40 MG  tablet Take 40 mg by mouth daily.    Marland Kitchen glucose blood (ONE TOUCH ULTRA TEST) test strip Use as instructed 100 each 12  . HYDROcodone-acetaminophen (NORCO/VICODIN) 5-325 MG tablet Take 1 tablet by mouth every 6 (six) hours as needed for moderate pain. 40 tablet 0  . Lancets MISC Use as directed once per day 100 each 12  . levothyroxine (SYNTHROID, LEVOTHROID) 88 MCG tablet TAKE ONE TABLET BY MOUTH ONCE DAILY (Patient taking differently: TAKE 53mg TABLET BY MOUTH ONCE DAILY) 90 tablet 0  . oxyCODONE-acetaminophen (PERCOCET/ROXICET) 5-325 MG tablet Take 1 tablet by mouth at bedtime as needed for severe pain. 5 tablet 0  . pravastatin (PRAVACHOL) 40 MG tablet TAKE ONE TABLET BY MOUTH ONCE DAILY (Patient taking differently: TAKE '40mg'$  TABLET BY MOUTH ONCE DAILY) 30 tablet 2  . tiZANidine (ZANAFLEX) 4 MG tablet Take 1 tablet (4 mg total) by mouth every 6 (six) hours as needed for muscle spasms. 40 tablet 1  . traMADol (ULTRAM) 50 MG tablet Take 1 tablet (50 mg total) by mouth every 6 (six) hours as needed. (Patient taking differently: Take 50 mg by mouth every 6 (six) hours as needed for moderate pain. ) 120 tablet 2   No current facility-administered medications on file prior to visit.    Review of Systems  Constitutional: Negative for unusual diaphoresis or night sweats HENT: Negative for ear swelling or discharge Eyes: Negative for worsening visual haziness  Respiratory: Negative for choking and stridor.   Gastrointestinal: Negative for distension or worsening eructation Genitourinary: Negative for retention or change in urine volume.  Musculoskeletal: Negative for other MSK pain or swelling Skin: Negative for color change and worsening wound Neurological: Negative for tremors and numbness other than noted  Psychiatric/Behavioral: Negative for decreased concentration or agitation other than above       Objective:   Physical Exam BP 138/68   Pulse 88   Temp 98.4 F (36.9 C) (Oral)   Resp 20    Wt 146 lb 8 oz (66.5 kg)   SpO2 91%   BMI 24.76 kg/m  VS noted, walks with walker slowly Constitutional: Pt appears in no apparent distress HENT: Head: NCAT.  Right Ear: External ear normal.  Left Ear: External ear normal.  Eyes: . Pupils are equal, round, and reactive to light. Conjunctivae and EOM are normal Neck: Normal range of motion. Neck supple.  Cardiovascular: Normal rate and regular rhythm.   Pulmonary/Chest: Effort normal and breath sounds without rales or wheezing.  Abd:  Soft, NT, ND, + BS Neurological: Pt is alert. Not confused , motor grossly intact Skin: Skin is warm. No rash, trace distal trace LLE edema only Psychiatric: Pt behavior is normal. No agitation. but fatigued and ? Mild dysphoric  Lab Results  Component Value Date  WBC 5.4 02/25/2016   HGB 12.0 02/25/2016   HCT 38.3 02/25/2016   PLT 236 02/25/2016   GLUCOSE 261 (H) 02/25/2016   CHOL 175 12/17/2015   TRIG 82.0 12/17/2015   HDL 61.10 12/17/2015   LDLCALC 97 12/17/2015   ALT 6 12/17/2015   AST 13 12/17/2015   NA 140 02/25/2016   K 4.2 02/25/2016   CL 104 02/25/2016   CREATININE 2.05 (H) 02/25/2016   BUN 51 (H) 02/25/2016   CO2 29 02/25/2016   TSH 3.74 10/16/2015   INR 1.0 05/03/2008   HGBA1C 6.3 12/17/2015   MICROALBUR 4.8 (H) 10/16/2015       Assessment & Plan:

## 2016-03-04 NOTE — Progress Notes (Signed)
Pre visit review using our clinic review tool, if applicable. No additional management support is needed unless otherwise documented below in the visit note. 

## 2016-03-04 NOTE — Assessment & Plan Note (Signed)
stable overall by history and exam, recent data reviewed with pt, and pt to continue medical treatment as before,  to f/u any worsening symptoms or concerns Lab Results  Component Value Date   HGBA1C 6.3 12/17/2015

## 2016-03-04 NOTE — Assessment & Plan Note (Addendum)
Has visibly slowed in overall health performance with higher risk fall due to weakness, unsteady, right hip pain/low back pain in the setting of CKD-4; for soc services per pt request for NHP as she does not want to be a burden further on her family, [facility], and HH with RN and PT; also to f/u with ortho next wk as planned

## 2016-03-04 NOTE — Assessment & Plan Note (Signed)
stable overall by history and exam, recent data reviewed with pt, and pt to continue medical treatment as before,  to f/u any worsening symptoms or concerns BP Readings from Last 3 Encounters:  03/04/16 138/68  02/25/16 180/93  02/19/16 120/76

## 2016-03-04 NOTE — Patient Instructions (Addendum)
Please continue all other medications as before, and refills have been done if requested.  Please have the pharmacy call with any other refills you may need.  Please continue your efforts at being more active, low cholesterol diet, and weight control.  You are otherwise up to date with prevention measures today.  Please keep your appointments with your specialists as you may have planned  You will be contacted regarding the referral for: Social Services, Home Health with RN, and PT and soc services if available through there, and [facility]  No further lab work needed today  Please return in 3 months, or sooner if needed

## 2016-03-09 ENCOUNTER — Ambulatory Visit (INDEPENDENT_AMBULATORY_CARE_PROVIDER_SITE_OTHER): Payer: Medicare Other | Admitting: Orthopaedic Surgery

## 2016-03-09 DIAGNOSIS — M545 Low back pain: Secondary | ICD-10-CM

## 2016-04-13 ENCOUNTER — Ambulatory Visit (INDEPENDENT_AMBULATORY_CARE_PROVIDER_SITE_OTHER): Payer: Medicare Other

## 2016-04-13 DIAGNOSIS — Z23 Encounter for immunization: Secondary | ICD-10-CM

## 2016-04-29 ENCOUNTER — Other Ambulatory Visit: Payer: Self-pay | Admitting: Internal Medicine

## 2016-04-29 NOTE — Telephone Encounter (Signed)
Done hardcopy to Corinne  

## 2016-04-29 NOTE — Telephone Encounter (Signed)
Patient called requesting refill on tramadol.  Patient is scheduling an appt.

## 2016-04-29 NOTE — Telephone Encounter (Signed)
faxed

## 2016-05-05 ENCOUNTER — Other Ambulatory Visit: Payer: Self-pay | Admitting: Internal Medicine

## 2016-05-07 ENCOUNTER — Encounter: Payer: Self-pay | Admitting: Internal Medicine

## 2016-05-07 ENCOUNTER — Ambulatory Visit (INDEPENDENT_AMBULATORY_CARE_PROVIDER_SITE_OTHER): Payer: Medicare Other | Admitting: Internal Medicine

## 2016-05-07 VITALS — BP 140/78 | HR 87 | Temp 98.0°F | Resp 20 | Wt 150.0 lb

## 2016-05-07 DIAGNOSIS — E083299 Diabetes mellitus due to underlying condition with mild nonproliferative diabetic retinopathy without macular edema, unspecified eye: Secondary | ICD-10-CM | POA: Diagnosis not present

## 2016-05-07 DIAGNOSIS — J309 Allergic rhinitis, unspecified: Secondary | ICD-10-CM | POA: Diagnosis not present

## 2016-05-07 DIAGNOSIS — E785 Hyperlipidemia, unspecified: Secondary | ICD-10-CM

## 2016-05-07 DIAGNOSIS — Z0001 Encounter for general adult medical examination with abnormal findings: Secondary | ICD-10-CM

## 2016-05-07 LAB — POCT GLYCOSYLATED HEMOGLOBIN (HGB A1C): HEMOGLOBIN A1C: 5.7

## 2016-05-07 MED ORDER — TRIAMCINOLONE ACETONIDE 55 MCG/ACT NA AERO: 2.0000 | INHALATION_SPRAY | Freq: Every day | NASAL | 12 refills | Status: DC

## 2016-05-07 MED ORDER — LOVASTATIN 40 MG PO TABS: 40.0000 mg | ORAL_TABLET | Freq: Every day | ORAL | 3 refills | Status: DC

## 2016-05-07 NOTE — Patient Instructions (Addendum)
Your A1c today is: 5.7  OK to change the pravastatin to lovastatin 40 mg per day  Please take all new medication as prescribed- the nasacort for the allergies  Please continue all other medications as before, and refills have been done if requested.  Please have the pharmacy call with any other refills you may need.  Please continue your efforts at being more active, low cholesterol diet, and weight control.  Please keep your appointments with your specialists as you may have planned  Please return in 1 months, or sooner if needed, with Lab testing done 3-5 days before

## 2016-05-07 NOTE — Progress Notes (Signed)
Pre visit review using our clinic review tool, if applicable. No additional management support is needed unless otherwise documented below in the visit note. 

## 2016-05-07 NOTE — Progress Notes (Signed)
Subjective:    Patient ID: Michelle Morton, female    DOB: July 20, 1931, 82 y.o.   MRN: [id number]  HPI  Here for 3 mo visit about 1 mo early.  c/o some voice change with scant prod cough due to post nasal gtt despite the zyrtec.  Does have several wks ongoing nasal allergy symptoms with clearish congestion, itch and sneezing, without fever, pain, ST, cough, swelling or wheezing. Pt denies chest pain, increased sob or doe, wheezing, orthopnea, PND, increased LE swelling, palpitations, dizziness or syncope.  Pt denies new neurological symptoms such as new headache, or facial or extremity weakness or numbness  Asks for change pravastatin due to on backorder.  Pt denies polydipsia, polyuria,   No other new history Past Medical History:  Diagnosis Date  . Allergic rhinitis, cause unspecified 02/14/2013  . Arthritis   . Arthus phenomenon   . Diabetes mellitus, type 2 ([place])   . Hearing loss    right ear, no hearing aid  . History of blood transfusion    [name] - unsure number of units transfused  . Hyperlipidemia   . Hypertension   . Hypothyroidism   . Kidney stones   . Osteoarthritis, knee    knees - otc med prn  . SVD (spontaneous vaginal delivery)    x 4  . Varicose veins    lower legs   Past Surgical History:  Procedure Laterality Date  . ABDOMINAL HYSTERECTOMY    . ANTERIOR AND POSTERIOR REPAIR N/A 04/17/2014   Procedure: ANTERIOR (CYSTOCELE) AND POSTERIOR REPAIR (RECTOCELE);  Surgeon: Cheri Fowler, MD;  Location: [facility];  Service: Gynecology;  Laterality: N/A;  . APPENDECTOMY    . CATARACT EXTRACTION Bilateral   . COLONOSCOPY    . KIDNEY STONE SURGERY     removal of stone  . REPLACEMENT TOTAL KNEE Bilateral   . TONSILLECTOMY    . WISDOM TOOTH EXTRACTION      reports that she quit smoking about 24 years ago. Her smoking use included Cigarettes. She has a 16.00 pack-year smoking history. She has never used smokeless tobacco. She reports that she does not drink alcohol or use  drugs. family history includes Breast cancer in her daughter; Diabetes in her brother, father, mother, and sister; Hypertension in her father. Allergies  Allergen Reactions  . Penicillins Rash    Allergic to IV penicillin but tolerates the oral form Has patient had a PCN reaction causing immediate rash, facial/tongue/throat swelling, SOB or lightheadedness with hypotension: no Has patient had a PCN reaction causing severe rash involving mucus membranes or skin necrosis: no Has patient had a PCN reaction that required hospitalization already in the hospital for other procedure  Has patient had a PCN reaction occurring within the last 10 years: no If all of the above answers are "NO", then ma   Current Outpatient Prescriptions on File Prior to Visit  Medication Sig Dispense Refill  . Blood Glucose Monitoring Suppl (ONE TOUCH ULTRA 2) w/Device KIT Use as directed once per day 1 each 0  . calcitonin, salmon, (MIACALCIN/FORTICAL) 200 UNIT/ACT nasal spray Place 1 spray into alternate nostrils daily as needed (allergies).     . calcitRIOL (ROCALTROL) 0.25 MCG capsule Take 0.25 mcg by mouth every Monday, Wednesday, and Friday.    . diclofenac sodium (VOLTAREN) 1 % GEL APPLY 4 GRAMS TOPICALLY FOUR TIMES DAILY 100 g 11  . fluticasone (FLONASE) 50 MCG/ACT nasal spray Place 2 sprays into both nostrils daily. 16 g 5  .  furosemide (LASIX) 40 MG tablet Take 40 mg by mouth daily.    Marland Kitchen glucose blood (ONE TOUCH ULTRA TEST) test strip Use as instructed 100 each 12  . HYDROcodone-acetaminophen (NORCO/VICODIN) 5-325 MG tablet Take 1 tablet by mouth every 6 (six) hours as needed for moderate pain. 40 tablet 0  . Lancets MISC Use as directed once per day 100 each 12  . levothyroxine (SYNTHROID, LEVOTHROID) 88 MCG tablet TAKE ONE TABLET BY MOUTH ONCE DAILY (Patient taking differently: TAKE 84mg TABLET BY MOUTH ONCE DAILY) 90 tablet 0  . oxyCODONE-acetaminophen (PERCOCET/ROXICET) 5-325 MG tablet Take 1 tablet by  mouth at bedtime as needed for severe pain. 5 tablet 0  . sitaGLIPtin (JANUVIA) 50 MG tablet Take 1 tablet (50 mg total) by mouth daily. 90 tablet 3  . tiZANidine (ZANAFLEX) 4 MG tablet Take 1 tablet (4 mg total) by mouth every 6 (six) hours as needed for muscle spasms. 40 tablet 1  . traMADol (ULTRAM) 50 MG tablet TAKE ONE TABLET BY MOUTH EVERY 6 HOURS AS NEEDED 120 tablet 2   No current facility-administered medications on file prior to visit.    Review of Systems  Constitutional: Negative for unusual diaphoresis or night sweats HENT: Negative for ear swelling or discharge Eyes: Negative for worsening visual haziness  Respiratory: Negative for choking and stridor.   Gastrointestinal: Negative for distension or worsening eructation Genitourinary: Negative for retention or change in urine volume.  Musculoskeletal: Negative for other MSK pain or swelling Skin: Negative for color change and worsening wound Neurological: Negative for tremors and numbness other than noted  Psychiatric/Behavioral: Negative for decreased concentration or agitation other than above   All other system neg per pt    Objective:   Physical Exam BP 140/78   Pulse 87   Temp 98 F (36.7 C) (Oral)   Resp 20   Wt 150 lb (68 kg)   SpO2 96%   BMI 25.35 kg/m  VS noted,  Constitutional: Pt appears in no apparent distress HENT: Head: NCAT.  Right Ear: External ear normal.  Left Ear: External ear normal.  Eyes: . Pupils are equal, round, and reactive to light. Conjunctivae and EOM are normal Bilat tm's with mild erythema.  Max sinus areas non tender.  Pharynx with mild erythema, no exudate Neck: Normal range of motion. Neck supple.  Cardiovascular: Normal rate and regular rhythm.   Pulmonary/Chest: Effort normal and breath sounds without rales or wheezing.  Neurological: Pt is alert. Not confused , motor grossly intact Skin: Skin is warm. No rash, no LE edema Psychiatric: Pt behavior is normal. No agitation.    No other new exam findings  POCT HgB A1C  Order: [id number] Status:  Final result Visible to patient:  No (Not Released) Dx:  Diabetes mellitus due to underlying c...   11:22  Hemoglobin A1C 5.7            Assessment & Plan:

## 2016-05-08 NOTE — Assessment & Plan Note (Signed)
Mild to mod, for add nasacort asd,  to f/u any worsening symptoms or concerns 

## 2016-05-08 NOTE — Assessment & Plan Note (Addendum)
To change pravastatin to lovastatin 40 per pt reqeust, o/w stable overall by history and exam, recent data reviewed with pt, and pt to continue medical treatment as before,  to f/u any worsening symptoms or concerns Lab Results  Component Value Date   LDLCALC 97 12/17/2015

## 2016-05-08 NOTE — Assessment & Plan Note (Signed)
stable overall by history and exam, recent data reviewed with pt, and pt to continue medical treatment as before,  to f/u any worsening symptoms or concerns Lab Results  Component Value Date   HGBA1C 5.7 05/07/2016

## 2016-06-04 ENCOUNTER — Ambulatory Visit (INDEPENDENT_AMBULATORY_CARE_PROVIDER_SITE_OTHER): Payer: Medicare Other | Admitting: Internal Medicine

## 2016-06-04 ENCOUNTER — Encounter: Payer: Self-pay | Admitting: Internal Medicine

## 2016-06-04 VITALS — BP 138/78 | HR 97 | Temp 98.7°F | Resp 20 | Wt 147.0 lb

## 2016-06-04 DIAGNOSIS — I10 Essential (primary) hypertension: Secondary | ICD-10-CM | POA: Diagnosis not present

## 2016-06-04 DIAGNOSIS — R609 Edema, unspecified: Secondary | ICD-10-CM

## 2016-06-04 DIAGNOSIS — E083299 Diabetes mellitus due to underlying condition with mild nonproliferative diabetic retinopathy without macular edema, unspecified eye: Secondary | ICD-10-CM | POA: Diagnosis not present

## 2016-06-04 DIAGNOSIS — R252 Cramp and spasm: Secondary | ICD-10-CM

## 2016-06-04 DIAGNOSIS — N184 Chronic kidney disease, stage 4 (severe): Secondary | ICD-10-CM

## 2016-06-04 MED ORDER — DICLOFENAC SODIUM 1 % TD GEL: TRANSDERMAL | 3 refills | Status: DC

## 2016-06-04 NOTE — Patient Instructions (Signed)
Please continue all other medications as before, and refills have been done if requested.  Please have the pharmacy call with any other refills you may need.  Please continue your efforts at being more active, low cholesterol diet, and weight control.  Please keep your appointments with your specialists as you may have planned  You will be contacted regarding the referral for: Echocardiogram

## 2016-06-04 NOTE — Progress Notes (Signed)
Pre visit review using our clinic review tool, if applicable. No additional management support is needed unless otherwise documented below in the visit note. 

## 2016-06-04 NOTE — Progress Notes (Signed)
Subjective:    Patient ID: Michelle Morton, female    DOB: 12/14/1931, 83 y.o.   MRN: [id number]  HPI  Here with daughter to f/u, had recent increased thyroid med per renal per pt, Denies hyper or hypo thyroid symptoms such as voice, skin or hair change. Overall renal fxn reportedly slightly worse.  Lasix now at 40 bid with persistent moderate bilat LE edema, hard to mobilize.    Pt denies chest pain, increased sob or doe, wheezing, orthopnea, PND, increased LE swelling, palpitations, dizziness or syncope.  Pt denies new neurological symptoms such as new headache, or facial or extremity weakness or numbness  Pt denies polydipsia, polyuria.  Only takes one tramadol per day, pain overall ok.  Does have occasional night leg cramps as well, asks for tx  No other new history Past Medical History:  Diagnosis Date  . Allergic rhinitis, cause unspecified 02/14/2013  . Arthritis   . Arthus phenomenon   . Diabetes mellitus, type 2 ([place])   . Hearing loss    right ear, no hearing aid  . History of blood transfusion    [name] - unsure number of units transfused  . Hyperlipidemia   . Hypertension   . Hypothyroidism   . Kidney stones   . Osteoarthritis, knee    knees - otc med prn  . SVD (spontaneous vaginal delivery)    x 4  . Varicose veins    lower legs   Past Surgical History:  Procedure Laterality Date  . ABDOMINAL HYSTERECTOMY    . ANTERIOR AND POSTERIOR REPAIR N/A 04/17/2014   Procedure: ANTERIOR (CYSTOCELE) AND POSTERIOR REPAIR (RECTOCELE);  Surgeon: Cheri Fowler, MD;  Location: [facility];  Service: Gynecology;  Laterality: N/A;  . APPENDECTOMY    . CATARACT EXTRACTION Bilateral   . COLONOSCOPY    . KIDNEY STONE SURGERY     removal of stone  . REPLACEMENT TOTAL KNEE Bilateral   . TONSILLECTOMY    . WISDOM TOOTH EXTRACTION      reports that she quit smoking about 24 years ago. Her smoking use included Cigarettes. She has a 16.00 pack-year smoking history. She has never used  smokeless tobacco. She reports that she does not drink alcohol or use drugs. family history includes Breast cancer in her daughter; Diabetes in her brother, father, mother, and sister; Hypertension in her father. Allergies  Allergen Reactions  . Penicillins Rash    Allergic to IV penicillin but tolerates the oral form Has patient had a PCN reaction causing immediate rash, facial/tongue/throat swelling, SOB or lightheadedness with hypotension: no Has patient had a PCN reaction causing severe rash involving mucus membranes or skin necrosis: no Has patient had a PCN reaction that required hospitalization already in the hospital for other procedure  Has patient had a PCN reaction occurring within the last 10 years: no If all of the above answers are "NO", then ma   Current Outpatient Prescriptions on File Prior to Visit  Medication Sig Dispense Refill  . Blood Glucose Monitoring Suppl (ONE TOUCH ULTRA 2) w/Device KIT Use as directed once per day 1 each 0  . calcitonin, salmon, (MIACALCIN/FORTICAL) 200 UNIT/ACT nasal spray Place 1 spray into alternate nostrils daily as needed (allergies).     . calcitRIOL (ROCALTROL) 0.25 MCG capsule Take 0.25 mcg by mouth every Monday, Wednesday, and Friday.    . fluticasone (FLONASE) 50 MCG/ACT nasal spray Place 2 sprays into both nostrils daily. 16 g 5  . furosemide (LASIX) 40  MG tablet Take 40 mg by mouth 2 (two) times daily.     Marland Kitchen glucose blood (ONE TOUCH ULTRA TEST) test strip Use as instructed 100 each 12  . HYDROcodone-acetaminophen (NORCO/VICODIN) 5-325 MG tablet Take 1 tablet by mouth every 6 (six) hours as needed for moderate pain. 40 tablet 0  . Lancets MISC Use as directed once per day 100 each 12  . levothyroxine (SYNTHROID, LEVOTHROID) 88 MCG tablet TAKE ONE TABLET BY MOUTH ONCE DAILY (Patient taking differently: TAKE 80mg TABLET BY MOUTH ONCE DAILY) 90 tablet 0  . lovastatin (MEVACOR) 40 MG tablet Take 1 tablet (40 mg total) by mouth at bedtime. 90  tablet 3  . oxyCODONE-acetaminophen (PERCOCET/ROXICET) 5-325 MG tablet Take 1 tablet by mouth at bedtime as needed for severe pain. 5 tablet 0  . sitaGLIPtin (JANUVIA) 50 MG tablet Take 1 tablet (50 mg total) by mouth daily. 90 tablet 3  . tiZANidine (ZANAFLEX) 4 MG tablet Take 1 tablet (4 mg total) by mouth every 6 (six) hours as needed for muscle spasms. 40 tablet 1  . traMADol (ULTRAM) 50 MG tablet TAKE ONE TABLET BY MOUTH EVERY 6 HOURS AS NEEDED 120 tablet 2  . triamcinolone (NASACORT AQ) 55 MCG/ACT AERO nasal inhaler Place 2 sprays into the nose daily. 1 Inhaler 12   No current facility-administered medications on file prior to visit.    Review of Systems  Constitutional: Negative for unusual diaphoresis or night sweats HENT: Negative for ear swelling or discharge Eyes: Negative for worsening visual haziness  Respiratory: Negative for choking and stridor.   Gastrointestinal: Negative for distension or worsening eructation Genitourinary: Negative for retention or change in urine volume.  Musculoskeletal: Negative for other MSK pain or swelling Skin: Negative for color change and worsening wound Neurological: Negative for tremors and numbness other than noted  Psychiatric/Behavioral: Negative for decreased concentration or agitation other than above   All other system neg per pt    Objective:   Physical Exam BP 138/78   Pulse 97   Temp 98.7 F (37.1 C) (Oral)   Resp 20   Wt 147 lb (66.7 kg)   SpO2 98%   BMI 24.84 kg/m  VS noted,  Constitutional: Pt appears in no apparent distress HENT: Head: NCAT.  Right Ear: External ear normal.  Left Ear: External ear normal.  Eyes: . Pupils are equal, round, and reactive to light. Conjunctivae and EOM are normal Neck: Normal range of motion. Neck supple.  Cardiovascular: Normal rate and regular rhythm.   Pulmonary/Chest: Effort normal and breath sounds without rales or wheezing.  Abd:  Soft, NT, ND, + BS Neurological: Pt is alert. Not  confused , motor grossly intact Skin: Skin is warm. No rash, stable 1-2+ chronic bilat LE edema Psychiatric: Pt behavior is normal. No agitation.  No other new exam findings Lab Results  Component Value Date   WBC 5.4 02/25/2016   HGB 12.0 02/25/2016   HCT 38.3 02/25/2016   PLT 236 02/25/2016   GLUCOSE 261 (H) 02/25/2016   CHOL 175 12/17/2015   TRIG 82.0 12/17/2015   HDL 61.10 12/17/2015   LDLCALC 97 12/17/2015   ALT 6 12/17/2015   AST 13 12/17/2015   NA 140 02/25/2016   K 4.2 02/25/2016   CL 104 02/25/2016   CREATININE 2.05 (H) 02/25/2016   BUN 51 (H) 02/25/2016   CO2 29 02/25/2016   TSH 3.74 10/16/2015   INR 1.0 05/03/2008   HGBA1C 5.7 05/07/2016   MICROALBUR 4.8 (  H) 10/16/2015        Assessment & Plan:

## 2016-06-05 DIAGNOSIS — R252 Cramp and spasm: Secondary | ICD-10-CM | POA: Insufficient documentation

## 2016-06-05 NOTE — Assessment & Plan Note (Addendum)
Stable, to cont lasix as is, for echo per daughter request, to f/u any worsening symptoms or concerns

## 2016-06-05 NOTE — Assessment & Plan Note (Signed)
With slight recent worsening, for cont'd renal f/u, avoid nephrotoxic meds

## 2016-06-05 NOTE — Assessment & Plan Note (Signed)
stable overall by history and exam, recent data reviewed with pt, and pt to continue medical treatment as before,  to f/u any worsening symptoms or concerns Lab Results  Component Value Date   HGBA1C 5.7 05/07/2016   Declines other lab today

## 2016-06-05 NOTE — Assessment & Plan Note (Signed)
Mild, for tizanidine prn,  to f/u any worsening symptoms or concerns

## 2016-06-05 NOTE — Assessment & Plan Note (Signed)
stable overall by history and exam, recent data reviewed with pt, and pt to continue medical treatment as before,  to f/u any worsening symptoms or concerns BP Readings from Last 3 Encounters:  06/04/16 138/78  05/07/16 140/78  03/04/16 138/68

## 2016-06-25 ENCOUNTER — Other Ambulatory Visit: Payer: Self-pay | Admitting: Internal Medicine

## 2016-07-08 ENCOUNTER — Telehealth (HOSPITAL_COMMUNITY): Payer: Self-pay | Admitting: Internal Medicine

## 2016-07-09 NOTE — Telephone Encounter (Signed)
07/08/2016 03:42 PM [address] [place], Michelle W (Self) [phone] (M)   Left Message - Called pt and lmsg for her to CB to get scheduled for an echo    By Elita Booneegina A Marvis Saefong

## 2016-07-12 ENCOUNTER — Encounter: Payer: Self-pay | Admitting: Internal Medicine

## 2016-07-31 ENCOUNTER — Telehealth: Payer: Self-pay | Admitting: Internal Medicine

## 2016-08-03 ENCOUNTER — Other Ambulatory Visit: Payer: Self-pay | Admitting: Internal Medicine

## 2016-08-05 ENCOUNTER — Ambulatory Visit (HOSPITAL_COMMUNITY): Payer: Medicare Other | Attending: Internal Medicine

## 2016-08-05 ENCOUNTER — Other Ambulatory Visit: Payer: Self-pay

## 2016-08-05 DIAGNOSIS — I5189 Other ill-defined heart diseases: Secondary | ICD-10-CM | POA: Insufficient documentation

## 2016-08-05 DIAGNOSIS — R609 Edema, unspecified: Secondary | ICD-10-CM | POA: Diagnosis not present

## 2016-08-05 DIAGNOSIS — I253 Aneurysm of heart: Secondary | ICD-10-CM | POA: Insufficient documentation

## 2016-08-11 NOTE — Progress Notes (Unsigned)
.  NRPOMJ

## 2016-08-26 ENCOUNTER — Telehealth: Payer: Self-pay | Admitting: Internal Medicine

## 2016-08-26 NOTE — Telephone Encounter (Signed)
Called pt, LVM with instructions per [name].

## 2016-08-26 NOTE — Telephone Encounter (Signed)
Pt called stating that she has been coughing up a lot of mucus and so much that she has been choking on it. Is there anything that she can try over the counter or [facility] [address]? Please advise.

## 2016-08-26 NOTE — Telephone Encounter (Signed)
It would fine to try Mucinex DM - OTC (twice daily as needed)

## 2016-09-17 ENCOUNTER — Other Ambulatory Visit: Payer: Self-pay | Admitting: Internal Medicine

## 2016-09-29 ENCOUNTER — Telehealth: Payer: Self-pay | Admitting: Internal Medicine

## 2016-09-29 NOTE — Telephone Encounter (Signed)
Faxed

## 2016-09-29 NOTE — Telephone Encounter (Signed)
Dr. Jonny Ruiz which OV dates would you like me to print and send?

## 2016-09-29 NOTE — Telephone Encounter (Signed)
Received diabetic shoe forms yesterday from Dr. Jonny Ruiz.  Did not receive OV notes with this.  Is requesting OV notes to be sent.  Can fax to [phone].

## 2016-09-29 NOTE — Telephone Encounter (Signed)
That should be the last one with the DM diagnosis, thanks

## 2016-11-01 ENCOUNTER — Other Ambulatory Visit: Payer: Self-pay | Admitting: Internal Medicine

## 2016-12-10 ENCOUNTER — Encounter: Payer: Self-pay | Admitting: Internal Medicine

## 2016-12-10 ENCOUNTER — Ambulatory Visit (INDEPENDENT_AMBULATORY_CARE_PROVIDER_SITE_OTHER): Payer: Medicare Other | Admitting: Internal Medicine

## 2016-12-10 VITALS — BP 146/82 | HR 84 | Ht 64.5 in | Wt 157.0 lb

## 2016-12-10 DIAGNOSIS — E2839 Other primary ovarian failure: Secondary | ICD-10-CM

## 2016-12-10 DIAGNOSIS — Z0000 Encounter for general adult medical examination without abnormal findings: Secondary | ICD-10-CM | POA: Diagnosis not present

## 2016-12-10 DIAGNOSIS — E083299 Diabetes mellitus due to underlying condition with mild nonproliferative diabetic retinopathy without macular edema, unspecified eye: Secondary | ICD-10-CM | POA: Diagnosis not present

## 2016-12-10 DIAGNOSIS — M542 Cervicalgia: Secondary | ICD-10-CM

## 2016-12-10 LAB — POCT GLYCOSYLATED HEMOGLOBIN (HGB A1C): HEMOGLOBIN A1C: 6.1

## 2016-12-10 NOTE — Patient Instructions (Signed)
Please continue all other medications as before, and refills have been done if requested.  Please have the pharmacy call with any other refills you may need.  Please continue your efforts at being more active, low cholesterol diet, and weight control.  You are otherwise up to date with prevention measures today.  Please keep your appointments with your specialists as you may have planned  You will be contacted regarding the referral for: Dr Katrinka BlazingSmith (or you can make appt as you leave today)  Please schedule the bone density test before leaving today at the scheduling desk (where you check out)  Your A1c was OK today  Please return in 6 months, or sooner if needed, with Lab testing done 3-5 days before

## 2016-12-10 NOTE — Progress Notes (Signed)
Subjective:    Patient ID: Michelle Morton, female    DOB: [birth year], 83 y.o.   MRN: [id number]  HPI  Here for wellness and f/u;  Overall doing ok;  Pt denies Chest pain, worsening SOB, DOE, wheezing, orthopnea, PND, worsening LE edema, palpitations, dizziness or syncope.  Pt denies neurological change such as new headache, facial or extremity weakness.  Pt denies polydipsia, polyuria, or low sugar symptoms. Pt states overall good compliance with treatment and medications, good tolerability, and has been trying to follow appropriate diet.  Pt denies worsening depressive symptoms, suicidal ideation or panic. No fever, night sweats, wt loss, loss of appetite, or other constitutional symptoms.  Pt states good ability with ADL's, has low fall risk, home safety reviewed and adequate, no other significant changes in hearing or vision, and not active with exercise.. Due for DXA.  S/p labs per renal earlier this month (reviewed scan in this emr). Does c/o left neck pain that seems to be located just below the ear down to the collarbone, worse to turn the head to the right. Past Medical History:  Diagnosis Date  . Allergic rhinitis, cause unspecified 02/14/2013  . Arthritis   . Arthus phenomenon   . Diabetes mellitus, type 2 ([place])   . Hearing loss    right ear, no hearing aid  . History of blood transfusion    [name] - unsure number of units transfused  . Hyperlipidemia   . Hypertension   . Hypothyroidism   . Kidney stones   . Osteoarthritis, knee    knees - otc med prn  . SVD (spontaneous vaginal delivery)    x 4  . Varicose veins    lower legs   Past Surgical History:  Procedure Laterality Date  . ABDOMINAL HYSTERECTOMY    . ANTERIOR AND POSTERIOR REPAIR N/A 04/17/2014   Procedure: ANTERIOR (CYSTOCELE) AND POSTERIOR REPAIR (RECTOCELE);  Surgeon: Cheri Fowler, MD;  Location: [facility];  Service: Gynecology;  Laterality: N/A;  . APPENDECTOMY    . CATARACT EXTRACTION Bilateral   .  COLONOSCOPY    . KIDNEY STONE SURGERY     removal of stone  . REPLACEMENT TOTAL KNEE Bilateral   . TONSILLECTOMY    . WISDOM TOOTH EXTRACTION      reports that she quit smoking about 24 years ago. Her smoking use included Cigarettes. She has a 16.00 pack-year smoking history. She has never used smokeless tobacco. She reports that she does not drink alcohol or use drugs. family history includes Breast cancer in her daughter; Diabetes in her brother, father, mother, and sister; Hypertension in her father. Allergies  Allergen Reactions  . Penicillins Rash    Allergic to IV penicillin but tolerates the oral form Has patient had a PCN reaction causing immediate rash, facial/tongue/throat swelling, SOB or lightheadedness with hypotension: no Has patient had a PCN reaction causing severe rash involving mucus membranes or skin necrosis: no Has patient had a PCN reaction that required hospitalization already in the hospital for other procedure  Has patient had a PCN reaction occurring within the last 10 years: no If all of the above answers are "NO", then ma   Current Outpatient Prescriptions on File Prior to Visit  Medication Sig Dispense Refill  . Blood Glucose Monitoring Suppl (ONE TOUCH ULTRA 2) w/Device KIT Use as directed once per day 1 each 0  . calcitonin, salmon, (MIACALCIN/FORTICAL) 200 UNIT/ACT nasal spray Place 1 spray into alternate nostrils daily as needed (allergies).     Marland Kitchen  calcitRIOL (ROCALTROL) 0.25 MCG capsule Take 0.25 mcg by mouth daily.     . diclofenac sodium (VOLTAREN) 1 % GEL APPLY 4 GRAMS TOPICALLY FOUR TIMES DAILY as needed 400 g 3  . fluticasone (FLONASE) 50 MCG/ACT nasal spray Place 2 sprays into both nostrils daily. 16 g 5  . furosemide (LASIX) 40 MG tablet Take 40 mg by mouth 2 (two) times daily.     Marland Kitchen glucose blood (ONE TOUCH ULTRA TEST) test strip Use as instructed 100 each 12  . HYDROcodone-acetaminophen (NORCO/VICODIN) 5-325 MG tablet Take 1 tablet by mouth every  6 (six) hours as needed for moderate pain. 40 tablet 0  . Lancets MISC Use as directed once per day 100 each 12  . levothyroxine (SYNTHROID, LEVOTHROID) 88 MCG tablet TAKE ONE TABLET BY MOUTH ONCE DAILY (Patient taking differently: TAKE 34mg TABLET BY MOUTH ONCE DAILY) 90 tablet 0  . lovastatin (MEVACOR) 40 MG tablet Take 1 tablet (40 mg total) by mouth at bedtime. 90 tablet 3  . oxyCODONE-acetaminophen (PERCOCET/ROXICET) 5-325 MG tablet Take 1 tablet by mouth at bedtime as needed for severe pain. 5 tablet 0  . sitaGLIPtin (JANUVIA) 50 MG tablet Take 1 tablet (50 mg total) by mouth daily. 90 tablet 3  . tiZANidine (ZANAFLEX) 4 MG tablet TAKE 1 TABLET BY MOUTH EVERY 6 HOURS AS NEEDED FOR MUSCLE SPASM 40 tablet 0  . traMADol (ULTRAM) 50 MG tablet TAKE ONE TABLET BY MOUTH EVERY 6 HOURS AS NEEDED 120 tablet 2  . triamcinolone (NASACORT AQ) 55 MCG/ACT AERO nasal inhaler Place 2 sprays into the nose daily. 1 Inhaler 12   No current facility-administered medications on file prior to visit.    Review of Systems Constitutional: Negative for other unusual diaphoresis, sweats, appetite or weight changes HENT: Negative for other worsening hearing loss, ear pain, facial swelling, mouth sores or neck stiffness.   Eyes: Negative for other worsening pain, redness or other visual disturbance.  Respiratory: Negative for other stridor or swelling Cardiovascular: Negative for other palpitations or other chest pain  Gastrointestinal: Negative for worsening diarrhea or loose stools, blood in stool, distention or other pain Genitourinary: Negative for hematuria, flank pain or other change in urine volume.  Musculoskeletal: Negative for myalgias or other joint swelling.  Skin: Negative for other color change, or other wound or worsening drainage.  Neurological: Negative for other syncope or numbness. Hematological: Negative for other adenopathy or swelling Psychiatric/Behavioral: Negative for hallucinations, other  worsening agitation, SI, self-injury, or new decreased concentration All other system neg per pt    Objective:   Physical Exam BP (!) 146/82   Pulse 84   Ht 5' 4.5" (1.638 m)   Wt 157 lb (71.2 kg)   SpO2 99%   BMI 26.53 kg/m  VS noted,  Constitutional: Pt is oriented to person, place, and time. Appears well-developed and well-nourished, in no significant distress and comfortable Head: Normocephalic and atraumatic  Eyes: Conjunctivae and EOM are normal. Pupils are equal, round, and reactive to light Right Ear: External ear normal without discharge Left Ear: External ear normal without discharge Nose: Nose without discharge or deformity Mouth/Throat: Oropharynx is without other ulcerations and moist  Neck: Normal range of motion. Neck supple. No JVD present. No tracheal deviation present or significant neck LA or mass Cardiovascular: Normal rate, regular rhythm, normal heart sounds and intact distal pulses.   Pulmonary/Chest: WOB normal and breath sounds without rales or wheezing  Abdominal: Soft. Bowel sounds are normal. NT. No HSM  Musculoskeletal: Normal range of motion. Exhibits no edema; + tender to left SCM Lymphadenopathy: Has no other cervical adenopathy.  Neurological: Pt is alert and oriented to person, place, and time. Pt has normal reflexes. No cranial nerve deficit. Motor grossly intact, Gait intact Skin: Skin is warm and dry. No rash noted or new ulcerations Psychiatric:  Has normal mood and affect. Behavior is normal without agitation No other exam finding  POCT glycosylated hemoglobin (Hb A1C)  Order: [id number]  Status:  Final result Visible to patient:  No (Not Released) Dx:  Diabetes mellitus due to underlying c...  Component 10:25  Hemoglobin A1C 6.1     Specimen Collected: 12/10/16 10:25 Last Resulted: 12/10/16 10:25              Assessment & Plan:

## 2016-12-12 DIAGNOSIS — M542 Cervicalgia: Secondary | ICD-10-CM | POA: Insufficient documentation

## 2016-12-12 NOTE — Assessment & Plan Note (Signed)
stable overall by history and exam, recent data reviewed with pt, and pt to continue medical treatment as before,  to f/u any worsening symptoms or concerns Lab Results  Component Value Date   HGBA1C 6.1 12/10/2016

## 2016-12-12 NOTE — Assessment & Plan Note (Signed)
C/w left scm mild pain suspect strained,  to f/u any worsening symptoms or concerns

## 2016-12-12 NOTE — Assessment & Plan Note (Signed)

## 2016-12-27 NOTE — Progress Notes (Signed)
Michelle ScaleZach Morton D.O. [facility] [address] [place], [place] [address] Phone: [phone] Subjective:    I'm seeing this patient by the request  of:  Michelle LevinsJohn, Michelle W, MD   CC: Neck and back pain  NUU:[id number]:Subjective  Michelle Morton is a 83 y.o. female coming in with complaint of neck and back pain. Patient is had this pain for quite some time. Has seen other providers. Has fairly recently and gone through physical therapy and states that when she was doing physical therapy was seeming to feel better. Having worsening symptoms and more on the back. Patient has had a history of a compression fracture previously. This was independently visualized by me on her previous x-rays back in April 2017. Patient is unfortunately continued to have chronic pain. Sometimes stops her from activity. Can only walk approximately 200 feet before stopping. Patient has been using a walker for regular basis for some time. Neck pain as well. Feels that she is doing it because of her lower back and having to turn her head in different areas. Sometimes can wake her up at night.     Past Medical History:  Diagnosis Date  . Allergic rhinitis, cause unspecified 02/14/2013  . Arthritis   . Arthus phenomenon   . Diabetes mellitus, type 2 ([facility])   . Hearing loss    right ear, no hearing aid  . History of blood transfusion    [facility] - unsure number of units transfused  . Hyperlipidemia   . Hypertension   . Hypothyroidism   . Kidney stones   . Osteoarthritis, knee    knees - otc med prn  . SVD (spontaneous vaginal delivery)    x 4  . Varicose veins    lower legs   Past Surgical History:  Procedure Laterality Date  . ABDOMINAL HYSTERECTOMY    . ANTERIOR AND POSTERIOR REPAIR N/A 04/17/2014   Procedure: ANTERIOR (CYSTOCELE) AND POSTERIOR REPAIR (RECTOCELE);  Surgeon: Lavina Hammanodd Meisinger, MD;  Location: [facility];  Service: Gynecology;  Laterality: N/A;  . APPENDECTOMY    . CATARACT EXTRACTION Bilateral   .  COLONOSCOPY    . KIDNEY STONE SURGERY     removal of stone  . REPLACEMENT TOTAL KNEE Bilateral   . TONSILLECTOMY    . WISDOM TOOTH EXTRACTION     Social History   Social History  . Marital status: Married    Spouse name: N/A  . Number of children: N/A  . Years of education: N/A   Social History Main Topics  . Smoking status: Former Smoker    Packs/day: 1.00    Years: 16.00    Types: Cigarettes    Quit date: 05/10/1992  . Smokeless tobacco: Never Used  . Alcohol use No  . Drug use: No  . Sexual activity: Yes    Birth control/ protection: Post-menopausal, Surgical     Comment: Hysterectomy   Other Topics Concern  . 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   Allergies  Allergen Reactions  . Penicillins Rash    Allergic to IV penicillin but tolerates the oral form Has patient had a PCN reaction causing immediate rash, facial/tongue/throat swelling, SOB or lightheadedness with  hypotension: no Has patient had a PCN reaction causing severe rash involving mucus membranes or skin necrosis: no Has patient had a PCN reaction that required hospitalization already in the hospital for other procedure  Has patient had a PCN reaction occurring within the last 10 years: no If all of the above answers are "NO", then ma   Family History  Problem Relation Age of Onset  . Diabetes Mother   . Diabetes Father   . Hypertension Father   . Breast cancer Daughter   . Diabetes Brother        x 1  . Diabetes Sister        x 4  . Colon cancer Neg Hx   . Esophageal cancer Neg Hx   . Pancreatic cancer Neg Hx   . Liver disease Neg Hx   . Kidney disease Neg Hx      Past medical history, social, surgical and family history all reviewed in  electronic medical record.  No pertanent information unless stated regarding to the chief complaint.   Review of Systems:Review of systems updated and as accurate as of 12/29/16  No headache, visual changes, nausea, vomiting, diarrhea, constipation, dizziness, abdominal pain, skin rash, fevers, chills, night sweats, weight loss, swollen lymph nodes, body aches, joint swelling, muscle aches, chest pain, shortness of breath, mood changes.   Objective  Blood pressure 138/70, pulse 81, height 5' 4.5" (1.638 m), weight 164 lb (74.4 kg), SpO2 99 %. Systems examined below as of 12/29/16   General: No apparent distress alert and oriented x3 mood and affect normal, dressed appropriately.  HEENT: Pupils equal, extraocular movements intact  Respiratory: Patient's speak in full sentences and does not appear short of breath  Cardiovascular: No lower extremity edema, non tender, no erythema  Skin: Warm dry intact with no signs of infection or rash on extremities or on axial skeleton.  Abdomen: Soft nontender  Neuro: Cranial nerves II through XII are intact, neurovascularly intact in all extremities with 2+ DTRs and 2+ pulses.  Lymph: No lymphadenopathy of posterior or anterior cervical chain or axillae bilaterally.  Gait walking with the aid of a walker MSK:  Non tender with full range of motion and good stability and symmetric strength and tone of shoulders, elbows, wrist, hip, knee and ankles bilaterally. Severe arthritic changes of multiple joints Neck: Inspection loss of lordosis. No palpable stepoffs. Negative Spurling's maneuver. Significant limitation in range of motion in all planes crepitus noted Grip strength and sensation normal in bilateral hands Strength good C4 to T1 distribution No sensory change to C4 to T1 Negative Hoffman sign bilaterally Reflexes normal  Back Exam:  Inspection: Patient does have significant kyphosis of the mid back as well as loss of lordosis of the low  back. Motion: Flexion 15 deg, Extension 5 deg, Side Bending to 25 deg bilaterally,  Rotation to 25 deg bilaterally  SLR laying: Negative  XSLR laying: Negative  Palpable tenderness: Severe tenderness to palpation and appears palmar musculature lumbar spine.Marland Kitchen. FABER: Unable to do secondary to tightness. Sensory change: Gross sensation intact to all lumbar and sacral dermatomes.  Reflexes: 2+ at both patellar tendons, 2+ at achilles tendons, Babinski's downgoing.  Strength at foot  4 out of 5 but symmetric. Patient does have some atrophy of the lower extremities    Impression and Recommendations:     This case required medical decision making of moderate complexity.      Note: This dictation was prepared with Dragon dictation along with  smaller phrase technology. Any transcriptional errors that result from this process are unintentional.

## 2016-12-29 ENCOUNTER — Ambulatory Visit (INDEPENDENT_AMBULATORY_CARE_PROVIDER_SITE_OTHER)
Admission: RE | Admit: 2016-12-29 | Discharge: 2016-12-29 | Disposition: A | Discharge status: Home / Self Care | Payer: Medicare Other | Source: Ambulatory Visit | Attending: Family Medicine | Admitting: Family Medicine

## 2016-12-29 ENCOUNTER — Encounter: Payer: Self-pay | Admitting: Family Medicine

## 2016-12-29 ENCOUNTER — Ambulatory Visit (INDEPENDENT_AMBULATORY_CARE_PROVIDER_SITE_OTHER)
Admission: RE | Admit: 2016-12-29 | Discharge: 2016-12-29 | Disposition: A | Discharge status: Home / Self Care | Payer: Medicare Other | Source: Ambulatory Visit | Attending: Internal Medicine | Admitting: Internal Medicine

## 2016-12-29 ENCOUNTER — Ambulatory Visit (INDEPENDENT_AMBULATORY_CARE_PROVIDER_SITE_OTHER): Payer: Medicare Other | Admitting: Family Medicine

## 2016-12-29 VITALS — BP 138/70 | HR 81 | Ht 64.5 in | Wt 164.0 lb

## 2016-12-29 DIAGNOSIS — M5136 Other intervertebral disc degeneration, lumbar region: Secondary | ICD-10-CM | POA: Diagnosis not present

## 2016-12-29 DIAGNOSIS — M545 Low back pain: Secondary | ICD-10-CM

## 2016-12-29 DIAGNOSIS — E2839 Other primary ovarian failure: Secondary | ICD-10-CM

## 2016-12-29 DIAGNOSIS — M542 Cervicalgia: Secondary | ICD-10-CM | POA: Diagnosis not present

## 2016-12-29 MED ORDER — VITAMIN D (ERGOCALCIFEROL) 1.25 MG (50000 UNIT) PO CAPS: 50000.0000 [IU] | ORAL_CAPSULE | ORAL | 0 refills | Status: DC

## 2016-12-29 NOTE — Assessment & Plan Note (Signed)
Patient does have likely some osteophytic changes as well. Intermittent radicular symptoms. We discussed icing regimen and home exercises. Patient will get x-rays today. Patient has tramadol for breakthrough pain and has had a muscle relaxer previously. We will try over-the-counter medications given once weekly vitamin D for muscle strength and endurance. Patient will follow-up with me again in 4 weeks.

## 2016-12-29 NOTE — Assessment & Plan Note (Signed)
Patient likely has severe osteophytic changes of the lumbar spine. No significant radicular symptoms at this time. Patient's once a trying conservative therapy. We made adjustments to patient's walker, home exercises given, we discussed icing regimen. Patient will start to increase activity as tolerated. Patient will follow-up with me again in 4 weeks. Worsening symptoms we'll consider formal physical therapy.

## 2016-12-29 NOTE — Patient Instructions (Signed)
Good to see you  We will get xrays today  Once weekly vitamin D for next 12 weeks  Over the counter get  Turmeric 500mg  daily  Tart cherry extract any dose at night Try pennsaid pinkie amount topically 2 times daily as needed.   Heat 10 minutes, ice 10 minutes then can repeat 3 times a day  Exercises 3 times a week.  See me again in 4 weeks.

## 2016-12-30 ENCOUNTER — Telehealth: Payer: Self-pay | Admitting: Family Medicine

## 2016-12-30 NOTE — Telephone Encounter (Signed)
Error

## 2017-01-26 ENCOUNTER — Other Ambulatory Visit (INDEPENDENT_AMBULATORY_CARE_PROVIDER_SITE_OTHER): Payer: Medicare Other

## 2017-01-26 ENCOUNTER — Encounter: Payer: Self-pay | Admitting: Family Medicine

## 2017-01-26 ENCOUNTER — Telehealth: Payer: Self-pay

## 2017-01-26 ENCOUNTER — Ambulatory Visit (INDEPENDENT_AMBULATORY_CARE_PROVIDER_SITE_OTHER): Payer: Medicare Other | Admitting: Family Medicine

## 2017-01-26 VITALS — BP 122/80 | HR 86 | Ht 64.5 in | Wt 165.0 lb

## 2017-01-26 DIAGNOSIS — E038 Other specified hypothyroidism: Secondary | ICD-10-CM | POA: Diagnosis not present

## 2017-01-26 DIAGNOSIS — M8000XA Age-related osteoporosis with current pathological fracture, unspecified site, initial encounter for fracture: Secondary | ICD-10-CM

## 2017-01-26 DIAGNOSIS — M810 Age-related osteoporosis without current pathological fracture: Secondary | ICD-10-CM | POA: Insufficient documentation

## 2017-01-26 DIAGNOSIS — M5136 Other intervertebral disc degeneration, lumbar region: Secondary | ICD-10-CM

## 2017-01-26 LAB — T4, FREE: Free T4: 1.21 ng/dL (ref 0.60–1.60)

## 2017-01-26 LAB — TSH: TSH: 3.32 u[IU]/mL (ref 0.35–4.50)

## 2017-01-26 LAB — T3, FREE: T3, Free: 2.5 pg/mL (ref 2.3–4.2)

## 2017-01-26 MED ORDER — GABAPENTIN 100 MG PO CAPS: 200.0000 mg | ORAL_CAPSULE | Freq: Every day | ORAL | 3 refills | Status: AC

## 2017-01-26 MED ORDER — VITAMIN D (ERGOCALCIFEROL) 1.25 MG (50000 UNIT) PO CAPS: 50000.0000 [IU] | ORAL_CAPSULE | ORAL | 0 refills | Status: DC

## 2017-01-26 NOTE — Assessment & Plan Note (Signed)
Worsening pain. Severe with patient having a compression fracture. Seems to likely be stabilized at this point. Continue the vitamin D. Patient's most recent bone density and does show that patient does have osteoporosis and we will start bisphosphonates. We discussed patient about potential side effects. Patient also started on gabapentin for the chronic pain aspect. Patient will hopefully will do much better with this. Patient will follow-up with me again in 4-6 weeks.

## 2017-01-26 NOTE — Assessment & Plan Note (Signed)
Patient has not had any significant value check in the last year and a half. We will recheck to see if this could be contribute to some of her generalized weakness.

## 2017-01-26 NOTE — Progress Notes (Signed)
[facility] [address] [place], [place] [address] Phone: [phone] Subjective:    I'm seeing this patient by the request  of:  [name], MD   CC: Neck and back pain f/u  [id number]  Michelle Morton is a 83 y.o. female coming in with complaint of neck and back pain. Patient is had this pain for quite some time. Has seen other providers. Has fairly recently and gone through physical therapy and states that when she was doing physical therapy was seeming to feel better. Having worsening symptoms and more on the back. Patient has had a history of a compression fracture previously. This was independently visualized by me on her previous x-rays back in April 2017. Patient is unfortunately continued to have chronic pain.  Patient saw me and was started on calcitonin. Patient did have an adjustment to her walker as well and we discussed over-the-counter medications and icing regimen. Patient states Unfortunately no improvement. Patient continues to have pain at all times. Patient continues to have pain even at night. Patient states that this pain is keeping her from sleeping. Patient states that it's also still affecting daily activities. States that has significant soreness in the legs bilaterally as well. Sometimes feels like she is weak.     Past Medical History:  Diagnosis Date  . Allergic rhinitis, cause unspecified 02/14/2013  . Arthritis   . Arthus phenomenon   . Diabetes mellitus, type 2 ([facility])   . Hearing loss    right ear, no hearing aid  . History of blood transfusion    [name] - unsure number of units transfused  . Hyperlipidemia   . Hypertension   . Hypothyroidism   . Kidney stones   . Osteoarthritis, knee    knees - otc med prn  . SVD (spontaneous vaginal delivery)    x 4  . Varicose veins    lower legs   Past Surgical History:  Procedure Laterality Date  . ABDOMINAL HYSTERECTOMY    . ANTERIOR AND POSTERIOR REPAIR N/A  04/17/2014   Procedure: ANTERIOR (CYSTOCELE) AND POSTERIOR REPAIR (RECTOCELE);  Surgeon: Lavina Hamman, MD;  Location: [facility];  Service: Gynecology;  Laterality: N/A;  . APPENDECTOMY    . CATARACT EXTRACTION Bilateral   . COLONOSCOPY    . KIDNEY STONE SURGERY     removal of stone  . REPLACEMENT TOTAL KNEE Bilateral   . TONSILLECTOMY    . WISDOM TOOTH EXTRACTION     Social History   Social History  . Marital status: Married    Spouse name: N/A  . Number of children: N/A  . Years of education: N/A   Social History Main Topics  . Smoking status: Former Smoker    Packs/day: 1.00    Years: 16.00    Types: Cigarettes    Quit date: 05/10/1992  . Smokeless tobacco: Never Used  . Alcohol use No  . Drug use: No  . Sexual activity: Yes    Birth control/ protection: Post-menopausal, Surgical     Comment: Hysterectomy   Other Topics Concern  . 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   Allergies  Allergen Reactions  .  Penicillins Rash    Allergic to IV penicillin but tolerates the oral form Has patient had a PCN reaction causing immediate rash, facial/tongue/throat swelling, SOB or lightheadedness with hypotension: no Has patient had a PCN reaction causing severe rash involving mucus membranes or skin necrosis: no Has patient had a PCN reaction that required hospitalization already in the hospital for other procedure  Has patient had a PCN reaction occurring within the last 10 years: no If all of the above answers are "NO", then ma   Family History  Problem Relation Age of Onset  . Diabetes Mother   . Diabetes Father   . Hypertension Father   . Breast cancer Daughter   . Diabetes Brother        x 1  . Diabetes Sister         x 4  . Colon cancer Neg Hx   . Esophageal cancer Neg Hx   . Pancreatic cancer Neg Hx   . Liver disease Neg Hx   . Kidney disease Neg Hx      Past medical history, social, surgical and family history all reviewed in electronic medical record.  No pertanent information unless stated regarding to the chief complaint.   Review of Systems: No headache, visual changes, nausea, vomiting, diarrhea, constipation, dizziness, abdominal pain, skin rash, fevers, chills, night sweats, weight loss, swollen lymph nodes, joint swelling, chest pain, shortness of breath, mood changes.  Positive muscle aches positive body aches  Objective  Blood pressure 122/80, pulse 86, height 5' 4.5" (1.638 m), weight 165 lb (74.8 kg), SpO2 97 %.   Systems examined below as of 01/26/17 General: NAD A&O x3 mood, affect normal  HEENT: Pupils equal, extraocular movements intact no nystagmus Respiratory: not short of breath at rest or with speaking Cardiovascular: 2+ lower extremity edema, non tender Skin: Warm dry intact with no signs of infection or rash on extremities or on axial skeleton. Abdomen: Soft nontender, no masses Neuro: Cranial nerves  intact, neurovascularly intact in all extremities with 2+ DTRs and 2+ pulses. Lymph: No lymphadenopathy appreciated today  Gait normal with good balance and coordination.  MSK: Non tender with full range of motion and good stability and symmetric strength and tone of shoulders, elbows, wrist,  knee hips and ankles bilaterally.   Gait walking with the aid of a walker MSK:  Non tender with full range of motion and good stability and symmetric strength and tone of shoulders, elbows, wrist, hip, and ankles bilaterally. Severe arthritic changes of multiple joints Neck: Inspection loss of lordosis. No palpable stepoffs. Negative Spurling's maneuver. Noted range of motion in all planes Grip strength and sensation normal in bilateral hands Strength good C4 to T1 distribution No  sensory change to C4 to T1 Negative Hoffman sign bilaterally Reflexes normal  Back Exam:  Inspection: Significant degenerative scoliosis Motion: Flexion 25 deg, Extension 5 deg, Side Bending to 35 deg bilaterally,  Rotation to 25 deg bilaterally  SLR laying: Negative  XSLR laying: Negative  Palpable tenderness: Diffuse tenderness of the thoracolumbar juncture in the lumbosacral juncture bilaterally no spinous process tenderness noted today. FABER: Unable to do secondary to tightness.     Impression and Recommendations:     This case required medical decision making of moderate complexity.      Note: This dictation was prepared with Dragon dictation along with smaller phrase technology. Any transcriptional errors that result from this process are unintentional.

## 2017-01-26 NOTE — Telephone Encounter (Signed)
I will be verifying insurance coverage for prolia and call patient back with findings

## 2017-01-26 NOTE — Assessment & Plan Note (Signed)
Patient does have a pathological fracture in addition to bone density consistent with osteoporosis. Patient would be a candidate then formed bisphosphonates. Patient will be set up for IV infusions. Follow-up with me again in 6 weeks

## 2017-01-26 NOTE — Patient Instructions (Addendum)
Good to see you both  I am sorry you are still hurting.  Continue the once weekly vitamin D  We will do prolia and they will call you this will help your bones.  Stop the nose spray when you are out We will get labs for the thyroid today  Gabapentin 100-200mg  at night  Half your cholesterol medicine and see if that helps.  See me again in 6 weeks.

## 2017-01-31 ENCOUNTER — Emergency Department (HOSPITAL_COMMUNITY)
Admission: EM | Admit: 2017-01-31 | Discharge: 2017-01-31 | Disposition: A | Discharge status: Home / Self Care | Payer: Medicare Other | Attending: Emergency Medicine | Admitting: Emergency Medicine

## 2017-01-31 DIAGNOSIS — E039 Hypothyroidism, unspecified: Secondary | ICD-10-CM | POA: Diagnosis not present

## 2017-01-31 DIAGNOSIS — E119 Type 2 diabetes mellitus without complications: Secondary | ICD-10-CM | POA: Diagnosis not present

## 2017-01-31 DIAGNOSIS — M159 Polyosteoarthritis, unspecified: Secondary | ICD-10-CM

## 2017-01-31 DIAGNOSIS — I10 Essential (primary) hypertension: Secondary | ICD-10-CM | POA: Insufficient documentation

## 2017-01-31 DIAGNOSIS — M1990 Unspecified osteoarthritis, unspecified site: Secondary | ICD-10-CM | POA: Insufficient documentation

## 2017-01-31 DIAGNOSIS — Z87891 Personal history of nicotine dependence: Secondary | ICD-10-CM | POA: Diagnosis not present

## 2017-01-31 DIAGNOSIS — Z96659 Presence of unspecified artificial knee joint: Secondary | ICD-10-CM | POA: Diagnosis not present

## 2017-01-31 DIAGNOSIS — Z7984 Long term (current) use of oral hypoglycemic drugs: Secondary | ICD-10-CM | POA: Diagnosis not present

## 2017-01-31 DIAGNOSIS — Z79899 Other long term (current) drug therapy: Secondary | ICD-10-CM | POA: Diagnosis not present

## 2017-01-31 DIAGNOSIS — M2550 Pain in unspecified joint: Secondary | ICD-10-CM | POA: Diagnosis present

## 2017-01-31 LAB — CBG MONITORING, ED: Glucose-Capillary: 145 mg/dL — ABNORMAL HIGH (ref 65–99)

## 2017-01-31 MED ORDER — OXYCODONE-ACETAMINOPHEN 5-325 MG PO TABS: 1.0000 | ORAL_TABLET | ORAL | 0 refills | Status: DC | PRN

## 2017-01-31 NOTE — ED Triage Notes (Signed)
Pt states that she has had ongoing R leg and knee pain and it is worsened today. States that she has also been having insomnia despite the medications her MD has given her. Alert and oriented.

## 2017-01-31 NOTE — ED Provider Notes (Signed)
[facility]-EMERGENCY DEPT Provider Note   CSN: [id number] Arrival date & time: 01/31/17  1526     History   Chief Complaint Chief Complaint  Patient presents with  . Leg Pain  . Insomnia    HPI Michelle Morton is a 83 y.o. female.  Patient is a 83 year old female who presents with joint pain. She has a history of osteoarthritis. She recently has been referred to sports medicine physician who started her on gabapentin. She doesn't like how the gabapentin is making her feel so she has stopped it. She has ongoing pain to both of her knees but more on the right side. She also has chronic pain in her neck and back. This pain is unchanged from baseline she just feels like she needs something to help control the pain. She has no fevers. No numbness or weakness to her extremities. No increased joint swelling. She previously has taken oxycodone and/or tramadol for pain but she doesn't have these medications any longer. She states that she was unable to sleep at all last night due to the pain. She has an upcoming follow-up appointment with Dr. Katrinka Blazing, her sports medicine physician on September 10.      Past Medical History:  Diagnosis Date  . Allergic rhinitis, cause unspecified 02/14/2013  . Arthritis   . Arthus phenomenon   . Diabetes mellitus, type 2 (HCC)   . Hearing loss    right ear, no hearing aid  . History of blood transfusion    [name] - unsure number of units transfused  . Hyperlipidemia   . Hypertension   . Hypothyroidism   . Kidney stones   . Osteoarthritis, knee    knees - otc med prn  . SVD (spontaneous vaginal delivery)    x 4  . Varicose veins    lower legs    Patient Active Problem List   Diagnosis Date Noted  . Osteoporosis 01/26/2017  . Degenerative disc disease, lumbar 12/29/2016  . Neck pain on left side 12/12/2016  . Leg cramps 06/05/2016  . General weakness 03/04/2016  . Hypoglycemia 12/17/2015  . RLS (restless legs syndrome) 12/17/2015  . Weight loss  12/17/2015  . Back pain 10/16/2015  . Right-sided chest pain 03/27/2015  . Diarrhea 03/27/2015  . Hoarseness 03/27/2015  . Loss of weight 03/27/2015  . Depression 01/07/2015  . Chronic kidney disease (CKD), stage IV (severe) (HCC) 01/07/2015  . AKI (acute kidney injury) (HCC) 10/09/2014  . Uterine prolaps 09/01/2013  . Peripheral edema 02/14/2013  . Allergic rhinitis 02/14/2013  . Obesity, Class II, BMI 35-39.9 05/12/2011  . Preventative health care 05/12/2011  . TINNITUS 01/09/2009  . Essential hypertension 05/05/2007  . Hypothyroidism 02/28/2007  . Diabetes (HCC) 02/28/2007  . Hyperlipidemia 02/28/2007  . Osteoarthrosis, unspecified whether generalized or localized, involving lower leg 02/28/2007    Past Surgical History:  Procedure Laterality Date  . ABDOMINAL HYSTERECTOMY    . ANTERIOR AND POSTERIOR REPAIR N/A 04/17/2014   Procedure: ANTERIOR (CYSTOCELE) AND POSTERIOR REPAIR (RECTOCELE);  Surgeon: Lavina Hamman, MD;  Location: [facility];  Service: Gynecology;  Laterality: N/A;  . APPENDECTOMY    . CATARACT EXTRACTION Bilateral   . COLONOSCOPY    . KIDNEY STONE SURGERY     removal of stone  . REPLACEMENT TOTAL KNEE Bilateral   . TONSILLECTOMY    . WISDOM TOOTH EXTRACTION      OB History    No data available       Home Medications  Prior to Admission medications   Medication Sig Start Date End Date Taking? Authorizing Provider  Blood Glucose Monitoring Suppl (ONE TOUCH ULTRA 2) w/Device KIT Use as directed once per day 02/20/16   [name], MD  calcitonin, salmon, (MIACALCIN/FORTICAL) 200 UNIT/ACT nasal spray Place 1 spray into alternate nostrils daily as needed (allergies).     [provider]  calcitRIOL (ROCALTROL) 0.25 MCG capsule Take 0.25 mcg by mouth daily.     [provider]  diclofenac sodium (VOLTAREN) 1 % GEL APPLY 4 GRAMS TOPICALLY FOUR TIMES DAILY as needed 06/04/16   [name], MD  furosemide (LASIX) 40 MG tablet Take 40 mg by  mouth 2 (two) times daily.     [provider]  gabapentin (NEURONTIN) 100 MG capsule Take 2 capsules (200 mg total) by mouth at bedtime. 01/26/17   Judi Saa, DO  glucose blood (ONE TOUCH ULTRA TEST) test strip Use as instructed 02/20/16   [name], MD  Lancets MISC Use as directed once per day 02/20/16   [name], MD  levothyroxine (SYNTHROID, LEVOTHROID) 88 MCG tablet TAKE ONE TABLET BY MOUTH ONCE DAILY Patient taking differently: TAKE TABLET BY MOUTH ONCE DAILY 12/04/15   [name], MD  lovastatin (MEVACOR) 40 MG tablet Take 1 tablet (40 mg total) by mouth at bedtime. 05/07/16   [name], MD  oxyCODONE-acetaminophen (PERCOCET/ROXICET) 5-325 MG tablet Take 1 tablet by mouth every 4 (four) hours as needed for severe pain. 01/31/17   Rolan Bucco, MD  sitaGLIPtin (JANUVIA) 50 MG tablet Take 1 tablet (50 mg total) by mouth daily. 03/04/16 03/04/17  [name], MD  triamcinolone (NASACORT AQ) 55 MCG/ACT AERO nasal inhaler Place 2 sprays into the nose daily. 05/07/16   [name], MD  Vitamin D, Ergocalciferol, (DRISDOL) 50000 units CAPS capsule Take 1 capsule (50,000 Units total) by mouth every 7 (seven) days. 01/26/17   Judi Saa, DO    Family History Family History  Problem Relation Age of Onset  . Diabetes Mother   . Diabetes Father   . Hypertension Father   . Breast cancer Daughter   . Diabetes Brother        x 1  . Diabetes Sister        x 4  . Colon cancer Neg Hx   . Esophageal cancer Neg Hx   . Pancreatic cancer Neg Hx   . Liver disease Neg Hx   . Kidney disease Neg Hx     Social History Social History  Substance Use Topics  . Smoking status: Former Smoker    Packs/day: 1.00    Years: 16.00    Types: Cigarettes    Quit date: 05/10/1992  . Smokeless tobacco: Never Used  . Alcohol use No     Allergies   Penicillins   Review of Systems Review of Systems  Constitutional: Negative for chills, diaphoresis, fatigue and  fever.  HENT: Negative for congestion, rhinorrhea and sneezing.   Eyes: Negative.   Respiratory: Negative for cough, chest tightness and shortness of breath.   Cardiovascular: Negative for chest pain and leg swelling.  Gastrointestinal: Negative for abdominal pain, blood in stool, diarrhea, nausea and vomiting.  Genitourinary: Negative for difficulty urinating, flank pain, frequency and hematuria.  Musculoskeletal: Positive for arthralgias, back pain and neck pain.  Skin: Negative for rash.  Neurological: Negative for dizziness, speech difficulty, weakness, numbness and headaches.     Physical Exam  Updated Vital Signs BP (!) 156/91 (BP Location: Left Arm)   Pulse 93   Temp 98.2 F (36.8 C) (Oral)   Resp 18   SpO2 100%   Physical Exam  Constitutional: She is oriented to person, place, and time. She appears well-developed and well-nourished.  HENT:  Head: Normocephalic and atraumatic.  Eyes: Pupils are equal, round, and reactive to light.  Neck: Normal range of motion. Neck supple.  Mild TTP to left cervical paraspinal tenderness  Cardiovascular: Normal rate, regular rhythm and normal heart sounds.   Pulmonary/Chest: Effort normal and breath sounds normal. No respiratory distress. She has no wheezes. She has no rales. She exhibits no tenderness.  Abdominal: Soft. Bowel sounds are normal. There is no tenderness. There is no rebound and no guarding.  Musculoskeletal: Normal range of motion. She exhibits edema (1+edema to bilateral lower extremities).  No joint effusions noted, no erythema.  +tenderness to right lower back area.  Pedal pulses intact  Lymphadenopathy:    She has no cervical adenopathy.  Neurological: She is alert and oriented to person, place, and time.  Motor 5/5 all extremities, sensation grossly intact to LT all extremities  Skin: Skin is warm and dry. No rash noted.  Psychiatric: She has a normal mood and affect.     ED Treatments / Results  Labs (all labs  ordered are listed, but only abnormal results are displayed) Labs Reviewed  CBG MONITORING, ED - Abnormal; Notable for the following:       Result Value   Glucose-Capillary 145 (*)    All other components within normal limits    EKG  EKG Interpretation None       Radiology No results found.  Procedures Procedures (including critical care time)  Medications Ordered in ED Medications - No data to display   Initial Impression / Assessment and Plan / ED Course  I have reviewed the triage vital signs and the nursing notes.  Pertinent labs & imaging results that were available during my care of the patient were reviewed by me and considered in my medical decision making (see chart for details).     Patient presents with joint pains. She is neurologically intact. There is no signs of joint infection. She was discharged home in good condition. She was given a short course of oxycodone. She states that she doesn't feel like the tramadol helped her in the past. I did advise her to take a stool softener along with oxycodone and to follow-up with Dr. Tamala Julian regarding ongoing pain management.  Final Clinical Impressions(s) / ED Diagnoses   Final diagnoses:  Osteoarthritis of multiple joints, unspecified osteoarthritis type    New Prescriptions New Prescriptions   OXYCODONE-ACETAMINOPHEN (PERCOCET/ROXICET) 5-325 MG TABLET    Take 1 tablet by mouth every 4 (four) hours as needed for severe pain.     Malvin Johns, MD 01/31/17 [facility]

## 2017-02-11 ENCOUNTER — Ambulatory Visit: Payer: Medicare Other | Admitting: Family Medicine

## 2017-02-14 ENCOUNTER — Ambulatory Visit (INDEPENDENT_AMBULATORY_CARE_PROVIDER_SITE_OTHER): Payer: Medicare Other | Admitting: Family Medicine

## 2017-02-14 ENCOUNTER — Encounter: Payer: Self-pay | Admitting: Family Medicine

## 2017-02-14 ENCOUNTER — Telehealth: Payer: Self-pay

## 2017-02-14 VITALS — BP 128/68 | HR 80 | Temp 97.9°F | Ht 64.5 in | Wt 166.0 lb

## 2017-02-14 DIAGNOSIS — M5441 Lumbago with sciatica, right side: Secondary | ICD-10-CM | POA: Diagnosis not present

## 2017-02-14 DIAGNOSIS — M5136 Other intervertebral disc degeneration, lumbar region: Secondary | ICD-10-CM | POA: Diagnosis not present

## 2017-02-14 DIAGNOSIS — Z23 Encounter for immunization: Secondary | ICD-10-CM | POA: Diagnosis not present

## 2017-02-14 DIAGNOSIS — M8000XA Age-related osteoporosis with current pathological fracture, unspecified site, initial encounter for fracture: Secondary | ICD-10-CM

## 2017-02-14 MED ORDER — METHYLPREDNISOLONE ACETATE 40 MG/ML IJ SUSP
40.0000 mg | Freq: Once | INTRAMUSCULAR | Status: AC
  Administered 2017-02-14: 40 mg via INTRAMUSCULAR

## 2017-02-14 NOTE — Progress Notes (Signed)
Michelle Morton - 83 y.o. female MRN [id number]  Date of birth: 20-Apr-1932  SUBJECTIVE:  Including CC & ROS.  Chief Complaint  Patient presents with  . Hip Pain    Patient is here today C/O right hip pain.  Pain started on 9.12.18 in the evening.  Denies any injury.  Pain with motion and is a 7-8 on a 10 point scale.  She states that she was given a sample of Pensaid which helped more than the Voltaren Gel does for all of her arthritic related pain.  Would like to get this.  . Other    Patient is requesting an order for a hospital bed.  She cannot get in and out of her bed everyday.      Ms. Riles is an 83 year old female is presenting with low back pain and right-sided sciatic type symptoms. She reports the symptoms occurred about 5 days ago. She denies any mechanism of injury. She has had these symptoms previously. She denies any history of injection or epidurals. She reports the pain is significant at night. She has trouble getting up from a lying position to use the restroom. At times she has accidents on herself. She reports that the pennsaid has helped.   She has a diagnosis of osteoporosis and has yet to be set up for that probably a effusions.   I have independently reviewed left hip x-ray from 02/25/16 which shows a preserved joint space of the left and right joint space. I have independently reviewed her lumbar spine x-ray from 12/29/16 which shows a new compression fracture of L1 and L3 since 2017. She has significant degenerative joint disease. Bone scan from 12/29/16 is showing a diagnosis of osteoporosis.  Review of Systems  Constitutional: Negative for fever.  Musculoskeletal: Positive for back pain and gait problem.  Skin: Negative for color change.  Neurological: Positive for weakness. Negative for numbness.    HISTORY: Past Medical, Surgical, Social, and Family History Reviewed & Updated per EMR.   Pertinent Historical Findings include:  Past Medical History:  Diagnosis Date  .  Allergic rhinitis, cause unspecified 02/14/2013  . Arthritis   . Arthus phenomenon   . Diabetes mellitus, type 2 (HCC)   . Hearing loss    right ear, no hearing aid  . History of blood transfusion    Michelle Morton - unsure number of units transfused  . Hyperlipidemia   . Hypertension   . Hypothyroidism   . Kidney stones   . Osteoarthritis, knee    knees - otc med prn  . SVD (spontaneous vaginal delivery)    x 4  . Varicose veins    lower legs    Past Surgical History:  Procedure Laterality Date  . ABDOMINAL HYSTERECTOMY    . ANTERIOR AND POSTERIOR REPAIR N/A 04/17/2014   Procedure: ANTERIOR (CYSTOCELE) AND POSTERIOR REPAIR (RECTOCELE);  Surgeon: Lavina Hamman, MD;  Location: [facility];  Service: Gynecology;  Laterality: N/A;  . APPENDECTOMY    . CATARACT EXTRACTION Bilateral   . COLONOSCOPY    . KIDNEY STONE SURGERY     removal of stone  . REPLACEMENT TOTAL KNEE Bilateral   . TONSILLECTOMY    . WISDOM TOOTH EXTRACTION      Allergies  Allergen Reactions  . Penicillins Rash    Allergic to IV penicillin but tolerates the oral form Has patient had a PCN reaction causing immediate rash, facial/tongue/throat swelling, SOB or lightheadedness with hypotension: no Has patient had a PCN reaction causing  severe rash involving mucus membranes or skin necrosis: no Has patient had a PCN reaction that required hospitalization already in the hospital for other procedure  Has patient had a PCN reaction occurring within the last 10 years: no If all of the above answers are "NO", then ma    Family History  Problem Relation Age of Onset  . Diabetes Mother   . Diabetes Father   . Hypertension Father   . Breast cancer Daughter   . Diabetes Brother        x 1  . Diabetes Sister        x 4  . Colon cancer Neg Hx   . Esophageal cancer Neg Hx   . Pancreatic cancer Neg Hx   . Liver disease Neg Hx   . Kidney disease Neg Hx      Social History   Social History  . Marital status:  Married    Spouse name: N/A  . Number of children: N/A  . Years of education: N/A   Occupational History  . Not on file.   Social History Main Topics  . Smoking status: Former Smoker    Packs/day: 1.00    Years: 16.00    Types: Cigarettes    Quit date: 05/10/1992  . Smokeless tobacco: Never Used  . Alcohol use No  . Drug use: No  . Sexual activity: Yes    Birth control/ protection: Post-menopausal, Surgical     Comment: Hysterectomy   Other Topics Concern  . 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     PHYSICAL EXAM:  VS: BP 128/68 (BP Location: Left Arm, Patient Position: Sitting, Cuff Size: Normal)   Pulse 80   Temp 97.9 F (36.6 C) (Oral)   Ht 5' 4.5" (1.638 m)   Wt 166 lb (75.3 kg)   SpO2 99%   BMI 28.05 kg/m  Physical Exam Gen: NAD, alert, cooperative with exam,  ENT: normal lips, normal nasal mucosa,  Eye: normal EOM, normal conjunctiva and lids CV:  +2 edema, +2 pedal pulses   Resp: no accessory muscle use, non-labored,  Skin: no rashes, no areas of induration  Neuro: normal tone, normal sensation to touch Psych:  normal insight, alert and oriented MSK:  Back: Some tenderness to palpation over the right lower back paraspinal muscles. Some tenderness to palpation over the greater trochanter on the right. Has weakness with hip flexion on the right compared to the left. Positive straight leg raise on the right negative on the left. Has trouble transitioning from a seated position to standing without help. Has to use a rolling walker for ambulation. Neurovascularly intact    ASSESSMENT & PLAN:   Degenerative disc disease, lumbar She seems to have an acute flare of back. Has not been able to  tolerate or did not get much relief muscle relaxers or gabapentin  - provided Pennsaid  - she is waiting to hear about starting prolia   Osteoporosis Spoke with Delaney Meigs today and still waiting to start Prolia   Back pain Pain seems to be a component of sciatic type symptoms. She has weakness on exam but this seems chronic in nature.  - IM depo  -  encouraged seated to standing exercises. May need a referral to PT  - f/u if no improvement, may need to consider a GT injection.

## 2017-02-14 NOTE — Assessment & Plan Note (Signed)
Pain seems to be a component of sciatic type symptoms. She has weakness on exam but this seems chronic in nature.  - IM depo  - encouraged seated to standing exercises. May need a referral to PT  - f/u if no improvement, may need to consider a GT injection.

## 2017-02-14 NOTE — Assessment & Plan Note (Signed)
Spoke with Michelle Morton today and still waiting to start Prolia

## 2017-02-14 NOTE — Assessment & Plan Note (Signed)
She seems to have an acute flare of back. Has not been able to tolerate or did not get much relief muscle relaxers or gabapentin  - provided Pennsaid  - she is waiting to hear about starting prolia

## 2017-02-14 NOTE — Patient Instructions (Signed)
Thank you for coming in,   Please try to stand for exercise. This can help you build the strength in your legs to keep your from getting this pain.    Please feel free to call with any questions or concerns at any time, at [phone]. --Dr. Jordan Likes

## 2017-02-14 NOTE — Telephone Encounter (Signed)
JJ-This patient is seen today for acute right hip pain/she expresses that she would like a referral to get a hospital bed as she cannot get in and out of her bed/Seen by JS today who states to send note to PCP/plz advise/thx dmf

## 2017-02-15 NOTE — Telephone Encounter (Signed)
Sorry, but her right hip pain and sciatica would be a very unusual reason for a hospital bed, which is meant for those essentially unable to care for themselves or require specific positioning for treatment  I would continue the treatment for the sciatica for now, as improvement with this will likely mean the hospital bed would not be needed

## 2017-02-21 ENCOUNTER — Other Ambulatory Visit: Payer: Self-pay | Admitting: Internal Medicine

## 2017-02-28 ENCOUNTER — Telehealth: Payer: Self-pay

## 2017-02-28 NOTE — Telephone Encounter (Signed)
Patients insurance has been verified for prolia injections, estimated $0 copay---this will be patient's first injection---patient has to arrange transportation to our office---it will be ok for her to get prolia at office visit with dr Katrinka Blazing or she can schedule on nurse visit at her convenience---patient will check with daughter (her transportation) and call back to schedule--can talk with tamara if any further questions

## 2017-03-01 ENCOUNTER — Ambulatory Visit (INDEPENDENT_AMBULATORY_CARE_PROVIDER_SITE_OTHER): Payer: Medicare Other

## 2017-03-01 DIAGNOSIS — M810 Age-related osteoporosis without current pathological fracture: Secondary | ICD-10-CM

## 2017-03-01 MED ORDER — DENOSUMAB 60 MG/ML ~~LOC~~ SOLN
60.0000 mg | Freq: Once | SUBCUTANEOUS | Status: AC
  Administered 2017-03-01: 60 mg via SUBCUTANEOUS

## 2017-03-01 NOTE — Progress Notes (Signed)
Medical screening examination/treatment/procedure(s) were performed by non-physician practitioner and as supervising physician I was immediately available for consultation/collaboration. I agree with above. James [name], MD   

## 2017-03-02 ENCOUNTER — Telehealth: Payer: Self-pay | Admitting: Internal Medicine

## 2017-03-02 NOTE — Telephone Encounter (Signed)
Patient had the prolia injection yesterday. They state now she is feeling very tried and sleeping a lot. She also is little headed. They want to know if that is related. Please follow up with patient. Thank you.

## 2017-03-02 NOTE — Telephone Encounter (Signed)
Advised patient that I have read the prolia insert containing contraindications/hypersensitivities, etc---and there is no mention of prolia making you sleepy---however, patient has been to ED a couple of times for hypoglycemia types of events and she stated her sugar was down yesterday/last night---I advised that you can be sleepy and a little dizzy when your sugar drops some---patient states her sugar is 115 today and she is feeling better---patient states she has no itching, no swelling in her face or throat, no rash---patient states she will call back if she gets worse again

## 2017-03-09 ENCOUNTER — Ambulatory Visit (INDEPENDENT_AMBULATORY_CARE_PROVIDER_SITE_OTHER): Payer: Medicare Other | Admitting: Family Medicine

## 2017-03-09 ENCOUNTER — Encounter: Payer: Self-pay | Admitting: Family Medicine

## 2017-03-09 ENCOUNTER — Other Ambulatory Visit: Payer: Self-pay

## 2017-03-09 DIAGNOSIS — M8000XA Age-related osteoporosis with current pathological fracture, unspecified site, initial encounter for fracture: Secondary | ICD-10-CM

## 2017-03-09 DIAGNOSIS — M5136 Other intervertebral disc degeneration, lumbar region: Secondary | ICD-10-CM

## 2017-03-09 MED ORDER — VITAMIN D (ERGOCALCIFEROL) 1.25 MG (50000 UNIT) PO CAPS: 50000.0000 [IU] | ORAL_CAPSULE | ORAL | 0 refills | Status: DC

## 2017-03-09 NOTE — Patient Instructions (Signed)
Good to see you  Michelle Morton is your friend.  Keep it up  Do not change a thing.  Maybe have thyroid check again in January See me again when you need me.

## 2017-03-09 NOTE — Assessment & Plan Note (Signed)
Encouraged patient to continue with the same regimen. We'll have it every 6 months. Otherwise in 2 years should take a holiday.

## 2017-03-09 NOTE — Assessment & Plan Note (Signed)
Stable. No significant change in management. Worsening symptoms we'll consider further treatment options.

## 2017-03-09 NOTE — Progress Notes (Signed)
[facility] [address] [place], [place] [address] Phone: [phone] Subjective:    I'm seeing this patient by the request  of:    CC: Pain over  [id number]  Michelle Morton is a 83 y.o. female coming in with complaint of Low back pain. Patient has had this. Did have a compression fracture. Started once weekly vitamin D and did start on prolia  Patient states feeling much better at this time. Patient is still concerned with the weight gain which she has not been as active. Looking to start increasing activity.     Past Medical History:  Diagnosis Date  . Allergic rhinitis, cause unspecified 02/14/2013  . Arthritis   . Arthus phenomenon   . Diabetes mellitus, type 2 (HCC)   . Hearing loss    right ear, no hearing aid  . History of blood transfusion    [name] - unsure number of units transfused  . Hyperlipidemia   . Hypertension   . Hypothyroidism   . Kidney stones   . Osteoarthritis, knee    knees - otc med prn  . SVD (spontaneous vaginal delivery)    x 4  . Varicose veins    lower legs   Past Surgical History:  Procedure Laterality Date  . ABDOMINAL HYSTERECTOMY    . ANTERIOR AND POSTERIOR REPAIR N/A 04/17/2014   Procedure: ANTERIOR (CYSTOCELE) AND POSTERIOR REPAIR (RECTOCELE);  Surgeon: Lavina Hamman, MD;  Location: [facility];  Service: Gynecology;  Laterality: N/A;  . APPENDECTOMY    . CATARACT EXTRACTION Bilateral   . COLONOSCOPY    . KIDNEY STONE SURGERY     removal of stone  . REPLACEMENT TOTAL KNEE Bilateral   . TONSILLECTOMY    . WISDOM TOOTH EXTRACTION     Social History   Social History  . Marital status: Married    Spouse name: N/A  . Number of children: N/A  . Years of education: N/A   Social History Main Topics  . Smoking status: Former Smoker    Packs/day: 1.00    Years: 16.00    Types: Cigarettes    Quit date: 05/10/1992  . Smokeless tobacco: Never Used  . Alcohol use No  . Drug use: No  . Sexual  activity: Yes    Birth control/ protection: Post-menopausal, Surgical     Comment: Hysterectomy   Other Topics Concern  . 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   Allergies  Allergen Reactions  . Penicillins Rash    Allergic to IV penicillin but tolerates the oral form Has patient had a PCN reaction causing immediate rash, facial/tongue/throat swelling, SOB or lightheadedness with hypotension: no Has patient had a PCN reaction causing severe rash involving mucus membranes or skin necrosis: no Has patient had a PCN reaction that required hospitalization already in the hospital for other procedure  Has patient had a PCN reaction occurring within the last 10 years: no If all of the above answers are "NO", then ma   Family History  Problem Relation Age of Onset  . Diabetes Mother   . Diabetes Father   . Hypertension Father   . Breast cancer Daughter   .  Diabetes Brother        x 1  . Diabetes Sister        x 4  . Colon cancer Neg Hx   . Esophageal cancer Neg Hx   . Pancreatic cancer Neg Hx   . Liver disease Neg Hx   . Kidney disease Neg Hx      Past medical history, social, surgical and family history all reviewed in electronic medical record.  No pertanent information unless stated regarding to the chief complaint.   Review of Systems:Review of systems updated and as accurate as of 03/09/17  No headache, visual changes, nausea, vomiting, diarrhea, constipation, dizziness, abdominal pain, skin rash, fevers, chills, night sweats, weight loss, swollen lymph nodes, body aches, joint swelling, chest pain, shortness of breath, mood changes. Positive muscle aches  Objective  Blood pressure 140/70, pulse (!) 111, height  (1.626  m), weight 168 lb (76.2 kg), SpO2 (!) 81 %. Systems examined below as of 03/09/17   General: No apparent distress alert and oriented x3 mood and affect normal, dressed appropriately.  HEENT: Pupils equal, extraocular movements intact  Respiratory: Patient's speak in full sentences and does not appear short of breath  Cardiovascular: No lower extremity edema, non tender, no erythema  Skin: Warm dry intact with no signs of infection or rash on extremities or on axial skeleton.  Abdomen: Soft nontender  Neuro: Cranial nerves II through XII are intact, neurovascularly intact in all extremities with 2+ DTRs and 2+ pulses.  Lymph: No lymphadenopathy of posterior or anterior cervical chain or axillae bilaterally.  Gait antalgic with a walker MSK:  tender with full range of motion and good stability and symmetric strength and tone of shoulders, elbows, wrist, hip, knee and ankles bilaterally. Arthritic changes of multiple joints Back exam has no spinous process tenderness. Some mild tightness of the paraspinal musculature of the lumbar spine. Degenerative scoliosis tightness in all planes.        Impression and Recommendations:     This case required medical decision making of moderate complexity.      Note: This dictation was prepared with Dragon dictation along with smaller phrase technology. Any transcriptional errors that result from this process are unintentional.

## 2017-03-20 ENCOUNTER — Other Ambulatory Visit: Payer: Self-pay | Admitting: Internal Medicine

## 2017-04-11 ENCOUNTER — Telehealth: Payer: Self-pay | Admitting: Family Medicine

## 2017-04-11 LAB — HM MAMMOGRAPHY

## 2017-04-11 NOTE — Telephone Encounter (Signed)
Patient requesting samples of pennsaid.

## 2017-04-12 NOTE — Telephone Encounter (Signed)
Left msg making pt aware samples of pennsaid are at front desk ready to be picked up.

## 2017-04-13 ENCOUNTER — Telehealth: Payer: Self-pay

## 2017-04-13 NOTE — Telephone Encounter (Signed)
error 

## 2017-05-02 ENCOUNTER — Telehealth: Payer: Self-pay | Admitting: Internal Medicine

## 2017-05-02 NOTE — Telephone Encounter (Signed)
Marcella from [facility] called to find out flu shot date, height, weight, last Hgb A1C and last BP.

## 2017-05-06 ENCOUNTER — Ambulatory Visit: Payer: Self-pay | Admitting: *Deleted

## 2017-05-06 ENCOUNTER — Encounter (HOSPITAL_COMMUNITY): Payer: Self-pay

## 2017-05-06 ENCOUNTER — Emergency Department (HOSPITAL_COMMUNITY): Payer: Medicare Other

## 2017-05-06 ENCOUNTER — Inpatient Hospital Stay (HOSPITAL_COMMUNITY)
Admission: EM | Admit: 2017-05-06 | Discharge: 2017-05-11 | DRG: 291 | Disposition: A | Discharge status: Skilled Nursing Facility | Payer: Medicare Other | Attending: Internal Medicine | Admitting: Internal Medicine

## 2017-05-06 ENCOUNTER — Ambulatory Visit (INDEPENDENT_AMBULATORY_CARE_PROVIDER_SITE_OTHER): Payer: Medicare Other | Admitting: Family

## 2017-05-06 ENCOUNTER — Other Ambulatory Visit: Payer: Self-pay

## 2017-05-06 ENCOUNTER — Encounter: Payer: Self-pay | Admitting: Family

## 2017-05-06 VITALS — BP 146/72 | HR 86 | Ht 64.0 in | Wt 183.0 lb

## 2017-05-06 DIAGNOSIS — N189 Chronic kidney disease, unspecified: Secondary | ICD-10-CM

## 2017-05-06 DIAGNOSIS — Z87891 Personal history of nicotine dependence: Secondary | ICD-10-CM | POA: Diagnosis not present

## 2017-05-06 DIAGNOSIS — J209 Acute bronchitis, unspecified: Secondary | ICD-10-CM | POA: Diagnosis present

## 2017-05-06 DIAGNOSIS — M7989 Other specified soft tissue disorders: Secondary | ICD-10-CM | POA: Diagnosis present

## 2017-05-06 DIAGNOSIS — Z79899 Other long term (current) drug therapy: Secondary | ICD-10-CM | POA: Diagnosis not present

## 2017-05-06 DIAGNOSIS — E1122 Type 2 diabetes mellitus with diabetic chronic kidney disease: Secondary | ICD-10-CM | POA: Diagnosis present

## 2017-05-06 DIAGNOSIS — E119 Type 2 diabetes mellitus without complications: Secondary | ICD-10-CM

## 2017-05-06 DIAGNOSIS — I130 Hypertensive heart and chronic kidney disease with heart failure and stage 1 through stage 4 chronic kidney disease, or unspecified chronic kidney disease: Secondary | ICD-10-CM | POA: Diagnosis not present

## 2017-05-06 DIAGNOSIS — Z79890 Hormone replacement therapy (postmenopausal): Secondary | ICD-10-CM | POA: Diagnosis not present

## 2017-05-06 DIAGNOSIS — Z87442 Personal history of urinary calculi: Secondary | ICD-10-CM

## 2017-05-06 DIAGNOSIS — D638 Anemia in other chronic diseases classified elsewhere: Secondary | ICD-10-CM | POA: Diagnosis present

## 2017-05-06 DIAGNOSIS — E039 Hypothyroidism, unspecified: Secondary | ICD-10-CM | POA: Diagnosis present

## 2017-05-06 DIAGNOSIS — R600 Localized edema: Secondary | ICD-10-CM

## 2017-05-06 DIAGNOSIS — E038 Other specified hypothyroidism: Secondary | ICD-10-CM | POA: Diagnosis not present

## 2017-05-06 DIAGNOSIS — Z833 Family history of diabetes mellitus: Secondary | ICD-10-CM

## 2017-05-06 DIAGNOSIS — E785 Hyperlipidemia, unspecified: Secondary | ICD-10-CM | POA: Diagnosis present

## 2017-05-06 DIAGNOSIS — Z96653 Presence of artificial knee joint, bilateral: Secondary | ICD-10-CM | POA: Diagnosis present

## 2017-05-06 DIAGNOSIS — Z8249 Family history of ischemic heart disease and other diseases of the circulatory system: Secondary | ICD-10-CM

## 2017-05-06 DIAGNOSIS — N184 Chronic kidney disease, stage 4 (severe): Secondary | ICD-10-CM | POA: Diagnosis present

## 2017-05-06 DIAGNOSIS — I361 Nonrheumatic tricuspid (valve) insufficiency: Secondary | ICD-10-CM | POA: Diagnosis not present

## 2017-05-06 DIAGNOSIS — M1990 Unspecified osteoarthritis, unspecified site: Secondary | ICD-10-CM | POA: Diagnosis present

## 2017-05-06 DIAGNOSIS — I5031 Acute diastolic (congestive) heart failure: Secondary | ICD-10-CM | POA: Diagnosis not present

## 2017-05-06 DIAGNOSIS — I5033 Acute on chronic diastolic (congestive) heart failure: Secondary | ICD-10-CM | POA: Diagnosis present

## 2017-05-06 DIAGNOSIS — Z880 Allergy status to penicillin: Secondary | ICD-10-CM | POA: Diagnosis not present

## 2017-05-06 DIAGNOSIS — R609 Edema, unspecified: Secondary | ICD-10-CM

## 2017-05-06 DIAGNOSIS — N179 Acute kidney failure, unspecified: Secondary | ICD-10-CM | POA: Diagnosis not present

## 2017-05-06 LAB — BASIC METABOLIC PANEL
ANION GAP: 12 (ref 5–15)
BUN: 130 mg/dL — ABNORMAL HIGH (ref 6–20)
CALCIUM: 9 mg/dL (ref 8.9–10.3)
CO2: 23 mmol/L (ref 22–32)
Chloride: 102 mmol/L (ref 101–111)
Creatinine, Ser: 2.83 mg/dL — ABNORMAL HIGH (ref 0.44–1.00)
GFR, EST AFRICAN AMERICAN: 17 mL/min — AB (ref >60)
GFR, EST NON AFRICAN AMERICAN: 14 mL/min — AB (ref >60)
GLUCOSE: 148 mg/dL — AB (ref 65–99)
POTASSIUM: 4.5 mmol/L (ref 3.5–5.1)
Sodium: 137 mmol/L (ref 135–145)

## 2017-05-06 LAB — URINALYSIS, ROUTINE W REFLEX MICROSCOPIC
BILIRUBIN URINE: NEGATIVE
Bacteria, UA: NONE SEEN
GLUCOSE, UA: NEGATIVE mg/dL
Hgb urine dipstick: NEGATIVE
KETONES UR: NEGATIVE mg/dL
LEUKOCYTES UA: NEGATIVE
NITRITE: NEGATIVE
PH: 6 (ref 5.0–8.0)
PROTEIN: NEGATIVE mg/dL
Specific Gravity, Urine: 1.009 (ref 1.005–1.030)
WBC, UA: NONE SEEN WBC/hpf (ref 0–5)

## 2017-05-06 LAB — I-STAT TROPONIN, ED: TROPONIN I, POC: 0.01 ng/mL (ref 0.00–0.08)

## 2017-05-06 LAB — CBC
HEMATOCRIT: 33 % — AB (ref 36.0–46.0)
HEMOGLOBIN: 10.8 g/dL — AB (ref 12.0–15.0)
MCH: 30.2 pg (ref 26.0–34.0)
MCHC: 32.7 g/dL (ref 30.0–36.0)
MCV: 92.2 fL (ref 78.0–100.0)
Platelets: 252 10*3/uL (ref 150–400)
RBC: 3.58 MIL/uL — AB (ref 3.87–5.11)
RDW: 14.5 % (ref 11.5–15.5)
WBC: 7 10*3/uL (ref 4.0–10.5)

## 2017-05-06 MED ORDER — INSULIN ASPART 100 UNIT/ML ~~LOC~~ SOLN
0.0000 [IU] | Freq: Three times a day (TID) | SUBCUTANEOUS | Status: DC
  Administered 2017-05-08: 3 [IU] via SUBCUTANEOUS
  Administered 2017-05-09: 1 [IU] via SUBCUTANEOUS
  Administered 2017-05-10: 2 [IU] via SUBCUTANEOUS
  Administered 2017-05-10: 1 [IU] via SUBCUTANEOUS
  Administered 2017-05-11: 7 [IU] via SUBCUTANEOUS

## 2017-05-06 MED ORDER — SALINE SPRAY 0.65 % NA SOLN
1.0000 | Freq: Two times a day (BID) | NASAL | Status: DC | PRN
  Filled 2017-05-06: qty 44

## 2017-05-06 MED ORDER — GABAPENTIN 100 MG PO CAPS
200.0000 mg | ORAL_CAPSULE | Freq: Every evening | ORAL | Status: DC | PRN
  Administered 2017-05-07 – 2017-05-10 (×3): 200 mg via ORAL
  Filled 2017-05-06 (×3): qty 2

## 2017-05-06 MED ORDER — ONDANSETRON HCL 4 MG/2ML IJ SOLN: 4.0000 mg | Freq: Four times a day (QID) | INTRAMUSCULAR | Status: DC | PRN

## 2017-05-06 MED ORDER — SODIUM CHLORIDE 0.9 % IV SOLN: 250.0000 mL | INTRAVENOUS | Status: DC | PRN

## 2017-05-06 MED ORDER — ACETAMINOPHEN 325 MG PO TABS: 650.0000 mg | ORAL_TABLET | ORAL | Status: DC | PRN

## 2017-05-06 MED ORDER — SODIUM CHLORIDE 0.9% FLUSH
3.0000 mL | Freq: Two times a day (BID) | INTRAVENOUS | Status: DC
  Administered 2017-05-07 – 2017-05-11 (×9): 3 mL via INTRAVENOUS

## 2017-05-06 MED ORDER — SODIUM CHLORIDE 0.9% FLUSH: 3.0000 mL | INTRAVENOUS | Status: DC | PRN

## 2017-05-06 MED ORDER — PRAVASTATIN SODIUM 40 MG PO TABS
40.0000 mg | ORAL_TABLET | Freq: Every day | ORAL | Status: DC
  Administered 2017-05-07 – 2017-05-10 (×4): 40 mg via ORAL
  Filled 2017-05-06 (×4): qty 1

## 2017-05-06 MED ORDER — LEVOTHYROXINE SODIUM 88 MCG PO TABS
88.0000 ug | ORAL_TABLET | Freq: Every day | ORAL | Status: DC
  Administered 2017-05-07 – 2017-05-11 (×5): 88 ug via ORAL
  Filled 2017-05-06 (×5): qty 1

## 2017-05-06 MED ORDER — LORATADINE 10 MG PO TABS
10.0000 mg | ORAL_TABLET | Freq: Every day | ORAL | Status: DC
  Administered 2017-05-07 – 2017-05-11 (×5): 10 mg via ORAL
  Filled 2017-05-06 (×5): qty 1

## 2017-05-06 MED ORDER — HEPARIN SODIUM (PORCINE) 5000 UNIT/ML IJ SOLN
5000.0000 [IU] | Freq: Three times a day (TID) | INTRAMUSCULAR | Status: DC
  Administered 2017-05-07: 5000 [IU] via SUBCUTANEOUS
  Filled 2017-05-06 (×2): qty 1

## 2017-05-06 MED ORDER — DICLOFENAC SODIUM 1 % TD GEL
4.0000 g | Freq: Two times a day (BID) | TRANSDERMAL | Status: DC | PRN
  Filled 2017-05-06: qty 100

## 2017-05-06 MED ORDER — LEVALBUTEROL HCL 0.63 MG/3ML IN NEBU
0.6300 mg | INHALATION_SOLUTION | Freq: Once | RESPIRATORY_TRACT | Status: AC
  Administered 2017-05-07: 0.63 mg via RESPIRATORY_TRACT
  Filled 2017-05-06 (×2): qty 3

## 2017-05-06 NOTE — ED Notes (Signed)
ED TO INPATIENT HANDOFF REPORT  Name/Age/Gender Michelle Morton 83 y.o. female  Code Status    Code Status Orders  (From admission, onward)        Start     Ordered   05/06/17 2324  Full code  Continuous     05/06/17 2328    Code Status History    Date Active Date Inactive Code Status Order ID Comments User Context   04/17/2014 11:56 04/18/2014 13:41 Full Code [id number]  Cheri Fowler, MD Inpatient    Advance Directive Documentation     Most Recent Value  Type of Advance Directive  Living will  Pre-existing out of facility DNR order (yellow form or pink MOST form)  No data  "MOST" Form in Place?  No data      Home/SNF/Other Home  Chief Complaint need fluid removed / sent by dr   Level of Care/Admitting Diagnosis ED Disposition    ED Disposition Condition [place]: [facility] [[id number]]  Level of Care: Telemetry [5]  Admit to tele based on following criteria: Complex arrhythmia (Bradycardia/Tachycardia)  Admit to tele based on following criteria: Monitor for Ischemic changes  Admit to tele based on following criteria: Acute CHF  Diagnosis: Bilateral lower extremity edema [332951]  Admitting Physician: Norval Morton [[id number]]  Attending Physician: Norval Morton [[id number]]  Estimated length of stay: past midnight tomorrow  Certification:: I certify this patient will need inpatient services for at least 2 midnights  PT Class (Do Not Modify): Inpatient [101]  PT Acc Code (Do Not Modify): Private [1]       Medical History Past Medical History:  Diagnosis Date  . Allergic rhinitis, cause unspecified 02/14/2013  . Arthritis   . Arthus phenomenon   . Diabetes mellitus, type 2 ([place])   . Hearing loss    right ear, no hearing aid  . History of blood transfusion    [name] - unsure number of units transfused  . Hyperlipidemia   . Hypertension   . Hypothyroidism   . Kidney stones   . Osteoarthritis, knee    knees  - otc med prn  . SVD (spontaneous vaginal delivery)    x 4  . Varicose veins    lower legs    Allergies Allergies  Allergen Reactions  . Penicillins Rash    Allergic to IV penicillin but tolerates the oral form Has patient had a PCN reaction causing immediate rash, facial/tongue/throat swelling, SOB or lightheadedness with hypotension: no Has patient had a PCN reaction causing severe rash involving mucus membranes or skin necrosis: no Has patient had a PCN reaction that required hospitalization already in the hospital for other procedure  Has patient had a PCN reaction occurring within the last 10 years: no If all of the above answers are "NO", then ma    IV Location/Drains/Wounds Patient Lines/Drains/Airways Status   Active Line/Drains/Airways    Name:   Placement date:   Placement time:   Site:   Days:   External Urinary Catheter   05/06/17    2202    -   less than 1   Incision (Closed) 04/17/14 Perineum   04/17/14    0854     1115   Incision (Closed) 04/17/14 Vagina   04/17/14    0905     1115          Labs/Imaging Results for orders placed or performed during the hospital encounter of 05/06/17 (from  the past 48 hour(s))  Basic metabolic panel     Status: Abnormal   Collection Time: 05/06/17  5:54 PM  Result Value Ref Range   Sodium 137 135 - 145 mmol/L   Potassium 4.5 3.5 - 5.1 mmol/L   Chloride 102 101 - 111 mmol/L   CO2 23 22 - 32 mmol/L   Glucose, Bld 148 (H) 65 - 99 mg/dL   BUN 130 (H) 6 - 20 mg/dL    Comment: RESULTS CONFIRMED BY MANUAL DILUTION   Creatinine, Ser 2.83 (H) 0.44 - 1.00 mg/dL   Calcium 9.0 8.9 - 10.3 mg/dL   GFR calc non Af Amer 14 (L) >60 mL/min   GFR calc Af Amer 17 (L) >60 mL/min    Comment: (NOTE) The eGFR has been calculated using the CKD EPI equation. This calculation has not been validated in all clinical situations. eGFR's persistently <60 mL/min signify possible Chronic Kidney Disease.    Anion gap 12 5 - 15  CBC     Status:  Abnormal   Collection Time: 05/06/17  5:54 PM  Result Value Ref Range   WBC 7.0 4.0 - 10.5 K/uL   RBC 3.58 (L) 3.87 - 5.11 MIL/uL   Hemoglobin 10.8 (L) 12.0 - 15.0 g/dL   HCT 33.0 (L) 36.0 - 46.0 %   MCV 92.2 78.0 - 100.0 fL   MCH 30.2 26.0 - 34.0 pg   MCHC 32.7 30.0 - 36.0 g/dL   RDW 14.5 11.5 - 15.5 %   Platelets 252 150 - 400 K/uL  I-stat troponin, ED     Status: None   Collection Time: 05/06/17  6:06 PM  Result Value Ref Range   Troponin i, poc 0.01 0.00 - 0.08 ng/mL   Comment 3            Comment: Due to the release kinetics of cTnI, a negative result within the first hours of the onset of symptoms does not rule out myocardial infarction with certainty. If myocardial infarction is still suspected, repeat the test at appropriate intervals.    Dg Chest 2 View  Result Date: 05/06/2017 CLINICAL DATA:  Progressive shortness of breath for 5 days. Leg swelling for 3 days. EXAM: CHEST  2 VIEW COMPARISON:  Chest radiograph February 25, 2016 FINDINGS: The cardiac silhouette is mildly enlarged. Tortuous, possibly ectatic aorta. Calcified aortic knob. Similar pulmonary vascular congestion without pleural effusion or focal consolidation mild chronic interstitial changes. No pneumothorax. Soft tissue planes included osseous structures are nonsuspicious. Osteopenia. Surgical clip projects in the abdomen. IMPRESSION: Stable cardiomegaly and pulmonary vascular congestion. Mild chronic interstitial changes. Aortic Atherosclerosis (ICD10-I70.0). Electronically Signed   By: Elon Alas M.D.   On: 05/06/2017 21:08   US Renal  Result Date: 05/06/2017 CLINICAL DATA:  Renal failure. EXAM: RENAL / URINARY TRACT ULTRASOUND COMPLETE COMPARISON:  None. FINDINGS: Right Kidney: Length: 9.2 cm. The right renal pelvis is prominent/distended. No caliectasis. An 8 mm cyst is identified. Left Kidney: Length: 10.4 cm.  A 7.2 cm cyst is identified. Bladder: Bilateral ureteral jets identified. The bladder is  grossly unremarkable. IMPRESSION: 1. There is a fluid-filled prominent right renal pelvis which is similar since 2016. No caliectasis. The kidneys are otherwise normal other than renal cysts as above. Electronically Signed   By: Dorise Bullion III M.D   On: 05/06/2017 23:03    Pending Labs Unresulted Labs (From admission, onward)   Start     Ordered   05/07/17 0500  CBC with Differential/Platelet  Tomorrow morning,   R     05/06/17 2330   05/07/17 0500  Renal function panel  Tomorrow morning,   R     05/06/17 2330   05/06/17 2332  TSH  Add-on,   R     05/06/17 2331   05/06/17 2330  Brain natriuretic peptide  Add-on,   R     05/06/17 2329   05/06/17 2326  Urea nitrogen, urine  Once,   R     05/06/17 2328   05/06/17 2325  Sodium, urine, random  Once,   R     05/06/17 2328   05/06/17 2325  Creatinine, urine, random  Once,   R     05/06/17 2328   05/06/17 2323  Troponin I  Now then every 6 hours,   R     05/06/17 2328   05/06/17 2245  Urinalysis, Routine w reflex microscopic  STAT,   STAT     05/06/17 2244   05/06/17 2125  Hepatic function panel  Once,   STAT     05/06/17 2124      Vitals/Pain Today's Vitals   05/06/17 1738 05/06/17 2104 05/06/17 2202 05/06/17 2314  BP:  (!) 154/70  137/79  Pulse:  79  88  Resp:  18  (!) 25  Temp:      TempSrc:      SpO2:  100%  100%  PainSc: 0-No pain  0-No pain     Isolation Precautions No active isolations  Medications Medications  loratadine (CLARITIN) tablet 10 mg (not administered)  diclofenac sodium (VOLTAREN) 1 % transdermal gel 4 g (not administered)  gabapentin (NEURONTIN) capsule 200 mg (not administered)  levothyroxine (SYNTHROID, LEVOTHROID) tablet 88 mcg (not administered)  pravastatin (PRAVACHOL) tablet 40 mg (not administered)  Nasal Spray 0.05 % SOLN 1 spray (not administered)  sodium chloride flush (NS) 0.9 % injection 3 mL (not administered)  sodium chloride flush (NS) 0.9 % injection 3 mL (not administered)  0.9  %  sodium chloride infusion (not administered)  acetaminophen (TYLENOL) tablet 650 mg (not administered)  ondansetron (ZOFRAN) injection 4 mg (not administered)  heparin injection 5,000 Units (not administered)  insulin aspart (novoLOG) injection 0-9 Units (not administered)    Mobility non-ambulatory

## 2017-05-06 NOTE — ED Notes (Signed)
Bed assigned [id number]

## 2017-05-06 NOTE — ED Notes (Signed)
Call report to samantha [phone] @ 0000

## 2017-05-06 NOTE — H&P (Addendum)
History and Physical    Michelle Morton YTK:[id number] DOB: Aug 28, 1931 DOA: 05/06/2017  Referring MD/NP/PA: Dr. Ralene Bathe PCP: Biagio Borg, MD  Patient coming from: PCP office  Chief Complaint: Leg swelling and weight gain  I have personally briefly reviewed patient's old medical records in [place]   HPI: Michelle Morton is a 83 y.o. female with medical history significant of HTN, HLD, hypothyroidism, DM type II, diastolic dysfunction last EF noted to be 55-60% with grade 1 dFx on 07/2016, and CKD stage IV; who presents with complaints of large who presents with complaints of progressive leg swelling and weight gain.  Patient reports that she just recently gotten over a "cold"last week some time. Associated symptoms included fatigue, shortness of breath with exertion, intermittent cough, and wheezing. She had discussed the leg swelling issue with her nephrologist Dr. Lorrene Reid earlier in the week where she reports being advised to take an additional Lasix dose. She normally takes 40 mg twice daily, but after their conversation reported took 80 mg twice daily on 12/4 and 12/5.  Denies noticing any significant change in her leg swelling or other symptoms, and after went back to her regularly dose of 40 mg twice daily.  She followed up at her primary care office today, and noted her concerns with continued leg edema. Per review of records at her PCP office today she weighed 183, and previously only weight 168 pounds on office visit from 03/09/2017.  Patient notes that she still able to urinate.  Denies having any significant falls, nausea,  vomiting, diarrhea, or bleeding to her knowledge.  Reports a incident where she had dark purple-looking stool 2 weeks ago but that has since resolved.  She denies any NSAID use and only takes Tylenol for low back and arthritis pain that is chronic.  At baseline she ambulates utilizing a rolling walker.  She can intermittently get dizzy when she wakes up in the morning when  her blood sugars are what she reports as low noting cbgs 90's 2/wk or when standing too quickly.  ED Course: Upon admission emergency department patient patient was noted to have relatively normal vital signs except for blood pressures noted to be 146/72-154/70.  Labs revealed WBC 7, Hbg 10.8, BUN 130, Cr 2.83, and glucose 148.  Dr. Clover Mealy of nephrology was consulted, and will see the patient in a.m.  Recommended checking albumin level and discouraged any additional diuretic use.  TR[facility] called to admit.  Review of Systems  Constitutional: Positive for malaise/fatigue. Negative for chills and fever.       Positive for weight gain  HENT: Negative for ear discharge and nosebleeds.   Eyes: Negative for photophobia and pain.  Respiratory: Positive for cough, shortness of breath and wheezing. Negative for sputum production.   Cardiovascular: Positive for leg swelling. Negative for chest pain.  Gastrointestinal: Negative for abdominal pain, constipation, diarrhea, nausea and vomiting.  Genitourinary: Negative for dysuria and hematuria.  Musculoskeletal: Positive for joint pain. Negative for falls.  Skin: Negative for itching and rash.  Neurological: Positive for dizziness. Negative for speech change, focal weakness and loss of consciousness.  Psychiatric/Behavioral: Negative for memory loss and substance abuse.    Past Medical History:  Diagnosis Date  . Allergic rhinitis, cause unspecified 02/14/2013  . Arthritis   . Arthus phenomenon   . Diabetes mellitus, type 2 ([place])   . Hearing loss    right ear, no hearing aid  . History of blood transfusion  [name] - unsure number of units transfused  . Hyperlipidemia   . Hypertension   . Hypothyroidism   . Kidney stones   . Osteoarthritis, knee    knees - otc med prn  . SVD (spontaneous vaginal delivery)    x 4  . Varicose veins    lower legs    Past Surgical History:  Procedure Laterality Date  . ABDOMINAL HYSTERECTOMY    .  ANTERIOR AND POSTERIOR REPAIR N/A 04/17/2014   Procedure: ANTERIOR (CYSTOCELE) AND POSTERIOR REPAIR (RECTOCELE);  Surgeon: Cheri Fowler, MD;  Location: [facility];  Service: Gynecology;  Laterality: N/A;  . APPENDECTOMY    . CATARACT EXTRACTION Bilateral   . COLONOSCOPY    . KIDNEY STONE SURGERY     removal of stone  . REPLACEMENT TOTAL KNEE Bilateral   . TONSILLECTOMY    . WISDOM TOOTH EXTRACTION       reports that she quit smoking about 25 years ago. Her smoking use included cigarettes. She has a 16.00 pack-year smoking history. she has never used smokeless tobacco. She reports that she does not drink alcohol or use drugs.  Allergies  Allergen Reactions  . Penicillins Rash    Allergic to IV penicillin but tolerates the oral form Has patient had a PCN reaction causing immediate rash, facial/tongue/throat swelling, SOB or lightheadedness with hypotension: no Has patient had a PCN reaction causing severe rash involving mucus membranes or skin necrosis: no Has patient had a PCN reaction that required hospitalization already in the hospital for other procedure  Has patient had a PCN reaction occurring within the last 10 years: no If all of the above answers are "NO", then ma    Family History  Problem Relation Age of Onset  . Diabetes Mother   . Diabetes Father   . Hypertension Father   . Breast cancer Daughter   . Diabetes Brother        x 1  . Diabetes Sister        x 4  . Colon cancer Neg Hx   . Esophageal cancer Neg Hx   . Pancreatic cancer Neg Hx   . Liver disease Neg Hx   . Kidney disease Neg Hx     Prior to Admission medications   Medication Sig Start Date End Date Taking? Authorizing Provider  cetirizine (ZYRTEC) 10 MG tablet Take 10 mg by mouth daily.   Yes [provider]  diclofenac sodium (VOLTAREN) 1 % GEL APPLY 4 GRAMS TOPICALLY FOUR TIMES DAILY as needed Patient taking differently: APPLY 4 GRAMS TOPICALLY BID TIMES DAILY AS NEEDED FOR PAIN 06/04/16  Yes  Biagio Borg, MD  furosemide (LASIX) 40 MG tablet Take 40 mg by mouth 2 (two) times daily.    Yes [provider]  gabapentin (NEURONTIN) 100 MG capsule Take 2 capsules (200 mg total) by mouth at bedtime. Patient taking differently: Take 200 mg by mouth at bedtime as needed (pain).  01/26/17  Yes Hulan Saas M, DO  JANUVIA 50 MG tablet TAKE ONE TABLET BY MOUTH ONCE DAILY 03/21/17  Yes Biagio Borg, MD  levothyroxine (SYNTHROID, LEVOTHROID) 88 MCG tablet TAKE ONE TABLET BY MOUTH ONCE DAILY Patient taking differently: TAKE 68mg TABLET BY MOUTH ONCE DAILY 12/04/15  Yes JBiagio Borg MD  lovastatin (MEVACOR) 40 MG tablet Take 1 tablet (40 mg total) by mouth at bedtime. 05/07/16  Yes JBiagio Borg MD  Oxymetazoline HCl (NASAL SPRAY NA) Place 1 spray into the nose  2 (two) times daily.   Yes [provider]  Vitamin D, Ergocalciferol, (DRISDOL) 50000 units CAPS capsule Take 1 capsule (50,000 Units total) by mouth every 7 (seven) days. 03/09/17  Yes Lyndal Pulley, DO  Blood Glucose Monitoring Suppl (ONE TOUCH ULTRA 2) w/Device KIT Use as directed once per day 02/20/16   Biagio Borg, MD  Lancets MISC Use as directed once per day 02/20/16   Biagio Borg, MD  ONE [facility] ULTRA TEST test strip USE AS DIRECTED 02/22/17   Biagio Borg, MD  oxyCODONE-acetaminophen (PERCOCET/ROXICET) 5-325 MG tablet Take 1 tablet by mouth every 4 (four) hours as needed for severe pain. Patient not taking: Reported on 05/06/2017 01/31/17   Malvin Johns, MD    Physical Exam:  Constitutional: Obese appearing elderly female who appears to be in no acute distress at this time. Vitals:   05/06/17 1715 05/06/17 2104  BP: (!) 147/75 (!) 154/70  Pulse: 75 79  Resp: 18 18  Temp: 97.7 F (36.5 C)   TempSrc: Oral   SpO2: 99% 100%   Eyes: PERRL, lids and conjunctivae normal ENMT: Mucous membranes are moist. Posterior pharynx clear of any exudate or lesions.   Neck: normal, supple, no masses, no  thyromegaly Respiratory: With auscultation of the anterior chest can appreciate some expiratory wheezes, but and posterior chest auscultation reveals  crackles throughout both lung fields.  Able to talk in fairly complete sentences Cardiovascular: Regular rate and rhythm, no murmurs / rubs / gallops.  2-3+ pitting edema up to the thigh and both lower extremities. 2+ pedal pulses. No carotid bruits.  Abdomen: no tenderness, no masses palpated. No hepatosplenomegaly. Bowel sounds positive.  Musculoskeletal: no cyanosis. No joint deformity upper and lower extremities. Good ROM, no contractures. Normal muscle tone.  Skin: no rashes, lesions, ulcers. No induration Neurologic: CN 2-12 grossly intact. Sensation intact, DTR normal. Strength 5/5 in all 4.  Psychiatric: Normal judgment and insight. Alert and oriented x 3. Normal mood.     Labs on Admission: I have personally reviewed following labs and imaging studies  CBC: Recent Labs  Lab 05/06/17 1754  WBC 7.0  HGB 10.8*  HCT 33.0*  MCV 92.2  PLT 425   Basic Metabolic Panel: Recent Labs  Lab 05/06/17 1754  NA 137  K 4.5  CL 102  CO2 23  GLUCOSE 148*  BUN 130*  CREATININE 2.83*  CALCIUM 9.0   GFR: Estimated Creatinine Clearance: 15.7 mL/min (A) (by C-G formula based on SCr of 2.83 mg/dL (H)). Liver Function Tests: No results for input(s): AST, ALT, ALKPHOS, BILITOT, PROT, ALBUMIN in the last 168 hours. No results for input(s): LIPASE, AMYLASE in the last 168 hours. No results for input(s): AMMONIA in the last 168 hours. Coagulation Profile: No results for input(s): INR, PROTIME in the last 168 hours. Cardiac Enzymes: No results for input(s): CKTOTAL, CKMB, CKMBINDEX, TROPONINI in the last 168 hours. BNP (last 3 results) No results for input(s): PROBNP in the last 8760 hours. HbA1C: No results for input(s): HGBA1C in the last 72 hours. CBG: No results for input(s): GLUCAP in the last 168 hours. Lipid Profile: No results  for input(s): CHOL, HDL, LDLCALC, TRIG, CHOLHDL, LDLDIRECT in the last 72 hours. Thyroid Function Tests: No results for input(s): TSH, T4TOTAL, FREET4, T3FREE, THYROIDAB in the last 72 hours. Anemia Panel: No results for input(s): VITAMINB12, FOLATE, FERRITIN, TIBC, IRON, RETICCTPCT in the last 72 hours. Urine analysis:    Component Value Date/Time  COLORURINE YELLOW 02/25/2016 [place] 02/25/2016 1456   LABSPEC 1.011 02/25/2016 1456   PHURINE 7.0 02/25/2016 1456   GLUCOSEU NEGATIVE 02/25/2016 1456   GLUCOSEU NEGATIVE 10/16/2015 1436   HGBUR NEGATIVE 02/25/2016 [place] 02/25/2016 1456   KETONESUR NEGATIVE 02/25/2016 1456   PROTEINUR NEGATIVE 02/25/2016 1456   UROBILINOGEN 0.2 10/16/2015 1436   NITRITE NEGATIVE 02/25/2016 1456   LEUKOCYTESUR NEGATIVE 02/25/2016 1456   Sepsis Labs: No results found for this or any previous visit (from the past 240 hour(s)).   Radiological Exams on Admission: Dg Chest 2 View  Result Date: 05/06/2017 CLINICAL DATA:  Progressive shortness of breath for 5 days. Leg swelling for 3 days. EXAM: CHEST  2 VIEW COMPARISON:  Chest radiograph February 25, 2016 FINDINGS: The cardiac silhouette is mildly enlarged. Tortuous, possibly ectatic aorta. Calcified aortic knob. Similar pulmonary vascular congestion without pleural effusion or focal consolidation mild chronic interstitial changes. No pneumothorax. Soft tissue planes included osseous structures are nonsuspicious. Osteopenia. Surgical clip projects in the abdomen. IMPRESSION: Stable cardiomegaly and pulmonary vascular congestion. Mild chronic interstitial changes. Aortic Atherosclerosis (ICD10-I70.0). Electronically Signed   By: Elon Alas M.D.   On: 05/06/2017 21:08    EKG: Independently reviewed.  Sinus rhythm  Assessment/Plan Diastolic CHF exacerbation, pedal edema: Acute.  Patient appears to be fluid overloaded on physical exam, but despite increased diuretic  patient reported minimal improvement in lower extremity edema symptoms.  Chest x-ray showing cardiomegaly that appears stable with pulmonary vascular congestion and chronic interstitial changes.  Last EF noted to be 55-60% with grade 1 diastolic dysfunction in 06/6107.  Nephrology recommended holding on any further diuresis at this time. - Admit to a telemetry bed - Heart failure order set initiated - Strict I&Os - Daily weights - add-on BNP - Follow-up prealbumin - trend troponins - Elevate lower extremities  - Check echocardiogram in a.m. - Patient poor candidate for ACE inhibitor with kidney function, and appears to be in acute CHF patient as a reason for no beta-blocker - Will likely warrant cardiology consultation in a.m.  Acute renal failure on chronic kidney disease IV: Patient's baseline creatinine had previously been noted to be around 2, but she presents with a creatinine of 2.83 with BUN 130.  Suspect symptoms could be secondary to aggressive diuresis. Patient sees Dr. Lorrene Reid of nephrology in the outpatient setting.  Dr. Harriette Bouillon of nephrology was consulted in the ED. - follow-up urinalysis - Check Urine sodium, urea, and creatinine - Recheck renal function panel in a.m. - Appreciate nephrology consultative services, follow-up for further recommendations in a.m.  Dyspnea/bronchitis: Acute.  Likely secondary to appearing fluid overload with crackles and some wheezing appreciated.  Patient able to maintain O2 saturations on room air. - Continuous pulse oximetry with nasal cannula oxygen as needed overnight - Trial of Levalbuterol of breathing treatment - Held off on steroids at this time given history of diabetes and patient not being in significant distress/ working hard to breathe.  Anemia of chronic disease: Patient with a hemoglobin of 10.8 on admission normally patient hemoglobin appears to be 11 g/dL or higher.  With the elevated BUN questioned the possibility of GI bleed.   Patient reports purple-looking stools 2 weeks ago, but states those symptoms have since resolved.  Vital signs are otherwise stable. - Recheck CBC in a.m  Essential hypertension: Patient currently not on any blood pressure medications. - Continue to monitor  Diabetes mellitus type 2: Patient appears to be well  controlled on oral medications of Januvia.  Last hemoglobin A1c on file noted to be 6.1 on 12/10/2016.  Patient reports having low blood sugars in the 90s and having complaints of dizziness.  Patient previously on metformin prior to being switched to [place] 2 years ago. - Hypoglycemic protocol - Would likely recommend discontinuation of Januvia as patient medication can cause acute heart failure and acute renal failure for which it appears the patient is presenting.  Also question of patient having hypoglycemic episodes despite reporting blood sugar being in the 90s twice weekly.  Consider discussing this with patient and her physicians. - CBGs q. before meals with sensitive SSI   Hypothyroidism: Last TSH noted to be 3.32 on 01/26/2017. - Check TSH - Continue levothyroxine  Hyperlipidemia - Continue pharmacy substitution for lovastatin  DVT prophylaxis: heparin Code Status: Full Family Communication: no family present at bedside Disposition Plan: Likely discharge home in 2-3 days Consults called: nephrology Admission status: Inpatient   Norval Morton MD [facility] Pager [phone]   If 7PM-7AM, please contact night-coverage [email or web address] Password [facility]  05/06/2017, 10:37 PM

## 2017-05-06 NOTE — ED Notes (Signed)
Call pt daughter [phone] at to let her know what room the pt will be admitted to it does not matter what time if no answer leave a message.

## 2017-05-06 NOTE — Telephone Encounter (Signed)
(  Patient has a new bed and she has trouble getting in it- she has not slept in it in 2 nights- she has been sleeping in her recliner)  Reason for Disposition . [1] MODERATE leg swelling (e.g., swelling extends up to knees) AND [2] new onset or worsening  Answer Assessment - Initial Assessment Questions 1. ONSET: "When did the swelling start?" (e.g., minutes, hours, days)     Swelling started on Tuesday 2. LOCATION: "What part of the leg is swollen?"  "Are both legs swollen or just one leg?"     Almost up to knee- all the way down to toes- both legs 3. SEVERITY: "How bad is the swelling?" (e.g., localized; mild, moderate, severe)  - Localized - small area of swelling localized to one leg  - MILD pedal edema - swelling limited to foot and ankle, pitting edema < 1/4 inch (6 mm) deep, rest and elevation eliminate most or all swelling  - MODERATE edema - swelling of lower leg to knee, pitting edema > 1/4 inch (6 mm) deep, rest and elevation only partially reduce swelling  - SEVERE edema - swelling extends above knee, facial or hand swelling present      Moderate- patient states skin is tight- can't pinch 4. REDNESS: "Does the swelling look red or infected?"     No redness 5. PAIN: "Is the swelling painful to touch?" If so, ask: "How painful is it?"   (Scale 1-10; mild, moderate or severe)     No- just bothersome- never has been this bad before 6. FEVER: "Do you have a fever?" If so, ask: "What is it, how was it measured, and when did it start?"      no 7. CAUSE: "What do you think is causing the leg swelling?"     No- she has not changed anything 8. MEDICAL HISTORY: "Do you have a history of heart failure, kidney disease, liver failure, or cancer?"     Kidney problem 9. RECURRENT SYMPTOM: "Have you had leg swelling before?" If so, ask: "When was the last time?" "What happened that time?"     History of swelling before- pills and elevation has helped 10. OTHER SYMPTOMS: "Do you have any other  symptoms?" (e.g., chest pain, difficulty breathing)       no 11. PREGNANCY: "Is there any chance you are pregnant?" "When was your last menstrual period?"       n/a  Protocols used: LEG SWELLING AND EDEMA-A-AH

## 2017-05-06 NOTE — ED Triage Notes (Signed)
Pt reports increased bilateral leg swelling x3 days. She reports that she had doubled Lasix on Tuesday and Wednesday as instructed by her PCP without relief. She also reports SOB with exertion. She denies pain, but endorses tenderness with palpation. A&Ox4.

## 2017-05-06 NOTE — Progress Notes (Signed)
Michelle Morton is a 83 y.o. female with the following history as recorded in [employer]:  Patient Active Problem List   Diagnosis Date Noted  . Osteoporosis 01/26/2017  . Degenerative disc disease, lumbar 12/29/2016  . Neck pain on left side 12/12/2016  . Leg cramps 06/05/2016  . General weakness 03/04/2016  . Hypoglycemia 12/17/2015  . RLS (restless legs syndrome) 12/17/2015  . Weight loss 12/17/2015  . Back pain 10/16/2015  . Right-sided chest pain 03/27/2015  . Diarrhea 03/27/2015  . Hoarseness 03/27/2015  . Loss of weight 03/27/2015  . Depression 01/07/2015  . Chronic kidney disease (CKD), stage IV (severe) ([place]) 01/07/2015  . AKI (acute kidney injury) ([place]) 10/09/2014  . Uterine prolaps 09/01/2013  . Peripheral edema 02/14/2013  . Allergic rhinitis 02/14/2013  . Obesity, Class II, BMI 35-39.9 05/12/2011  . Preventative health care 05/12/2011  . TINNITUS 01/09/2009  . Essential hypertension 05/05/2007  . Hypothyroidism 02/28/2007  . Diabetes ([place]) 02/28/2007  . Hyperlipidemia 02/28/2007  . Osteoarthrosis, unspecified whether generalized or localized, involving lower leg 02/28/2007    Current Outpatient Medications  Medication Sig Dispense Refill  . Blood Glucose Monitoring Suppl (ONE TOUCH ULTRA 2) w/Device KIT Use as directed once per day 1 each 0  . diclofenac sodium (VOLTAREN) 1 % GEL APPLY 4 GRAMS TOPICALLY FOUR TIMES DAILY as needed 400 g 3  . furosemide (LASIX) 40 MG tablet Take 40 mg by mouth 2 (two) times daily.     Marland Kitchen gabapentin (NEURONTIN) 100 MG capsule Take 2 capsules (200 mg total) by mouth at bedtime. 60 capsule 3  . JANUVIA 50 MG tablet TAKE ONE TABLET BY MOUTH ONCE DAILY 90 tablet 2  . Lancets MISC Use as directed once per day 100 each 12  . levothyroxine (SYNTHROID, LEVOTHROID) 88 MCG tablet TAKE ONE TABLET BY MOUTH ONCE DAILY (Patient taking differently: TAKE 76mg TABLET BY MOUTH ONCE DAILY) 90 tablet 0  . lovastatin (MEVACOR) 40 MG tablet Take 1 tablet (40  mg total) by mouth at bedtime. 90 tablet 3  . ONE TOUCH ULTRA TEST test strip USE AS DIRECTED 50 each 25  . oxyCODONE-acetaminophen (PERCOCET/ROXICET) 5-325 MG tablet Take 1 tablet by mouth every 4 (four) hours as needed for severe pain. 15 tablet 0  . Vitamin D, Ergocalciferol, (DRISDOL) 50000 units CAPS capsule Take 1 capsule (50,000 Units total) by mouth every 7 (seven) days. 12 capsule 0   No current facility-administered medications for this visit.     Allergies: Penicillins  Past Medical History:  Diagnosis Date  . Allergic rhinitis, cause unspecified 02/14/2013  . Arthritis   . Arthus phenomenon   . Diabetes mellitus, type 2 ([place]   . Hearing loss    right ear, no hearing aid  . History of blood transfusion    [facility]- unsure number of units transfused  . Hyperlipidemia   . Hypertension   . Hypothyroidism   . Kidney stones   . Osteoarthritis, knee    knees - otc med prn  . SVD (spontaneous vaginal delivery)    x 4  . Varicose veins    lower legs    Past Surgical History:  Procedure Laterality Date  . ABDOMINAL HYSTERECTOMY    . ANTERIOR AND POSTERIOR REPAIR N/A 04/17/2014   Procedure: ANTERIOR (CYSTOCELE) AND POSTERIOR REPAIR (RECTOCELE);  Surgeon: TCheri Fowler MD;  [facility];  Service: Gynecology;  Laterality: N/A;  . APPENDECTOMY    . CATARACT EXTRACTION Bilateral   . COLONOSCOPY    .  KIDNEY STONE SURGERY     removal of stone  . REPLACEMENT TOTAL KNEE Bilateral   . TONSILLECTOMY    . WISDOM TOOTH EXTRACTION      Family History  Problem Relation Age of Onset  . Diabetes Mother   . Diabetes Father   . Hypertension Father   . Breast cancer Daughter   . Diabetes Brother        x 1  . Diabetes Sister        x 4  . Colon cancer Neg Hx   . Esophageal cancer Neg Hx   . Pancreatic cancer Neg Hx   . Liver disease Neg Hx   . Kidney disease Neg Hx     Social History   Tobacco Use  . Smoking status: Former Smoker    Packs/day: 1.00    Years:  16.00    Pack years: 16.00    Types: Cigarettes    Last attempt to quit: 05/10/1992    Years since quitting: 25.0  . Smokeless tobacco: Never Used  Substance Use Topics  . Alcohol use: No    Alcohol/week: 0.0 oz    Subjective:  Presents with concerns for worsening swelling in her legs and weight gain; does have Stage 4 kidney disease; spoke to her nephrologist earlier this week about the swelling. Was told to increase Lasix from 40 mg bid to 40 mg in the am and 20 mg in the pm; patient saw some initial benefit but symptoms have continued to worsen. She is experiencing pain in both of her lower legs and notes that the swelling is progressing up into her thighs; does not have documented CHF; does have some cough and finds herself very short of breath with activity; has heard herself wheeze occasionally. Weight gain of 10+ pounds is noted today;  Objective:  Vitals:   05/06/17 1504  BP: (!) 146/72  Pulse: 86  SpO2: 98%  Weight: 183 lb (83 kg)  Height: '5\' 4"'$  (1.626 m)    General: Well developed, well nourished, in no acute distress  Skin : Warm and dry.  Head: Normocephalic and atraumatic  Eyes: Sclera and conjunctiva clear; pupils round and reactive to light; Lungs: Respirations unlabored; wheeze noted in lower lobe CVS exam: normal rate and regular rhythm.  Abdomen: Soft; nontender; nondistended; normoactive bowel sounds; no masses or hepatosplenomegaly  Musculoskeletal: No deformities; no active joint inflammation  Extremities: Marked bilateral pedaledema- 3+, cyanosis, clubbing  Vessels: Symmetric bilaterally  Neurologic: Alert and oriented; speech intact; face symmetrical; moves all extremities well; CNII-XII intact without focal deficit Uses walker; Assessment:  1. Pedal edema     Plan:  Due to marked weight gain, shortness of breath and limited response to increased oral Lasix, feel IV treatment most likely necessary; she will to go to ER for evaluation and possible admission.  Follow-up to be determined.  No Follow-up on file.  No orders of the defined types were placed in this encounter.   Requested Prescriptions    No prescriptions requested or ordered in this encounter

## 2017-05-06 NOTE — ED Provider Notes (Signed)
[facility] Provider Note   CSN: [id number] Arrival date & time: 05/06/17  1553     History   Chief Complaint Chief Complaint  Patient presents with  . Leg Swelling    bilateral    HPI Michelle Morton is a 83 y.o. female.  The history is provided by the patient. No language interpreter was used.   Michelle Morton is a 83 y.o. female who presents to the Emergency Department complaining of leg swelling.  She presents to the emergency department for progressive leg swelling since Tuesday.  She does have a history of chronic leg edema but it significantly worsened 3 days ago.  She was told to increase her Lasix dose with no change in her leg edema.  She denies any fevers, chest pain, abdominal pain.  She is making urine without difficulty.  She does have occasional difficulty breathing and coughing when she goes to bed at night.  She has a history of diabetes and chronic kidney disease.  Symptoms are moderate, constant, worsening. Past Medical History:  Diagnosis Date  . Allergic rhinitis, cause unspecified 02/14/2013  . Arthritis   . Arthus phenomenon   . Diabetes mellitus, type 2 ([place])   . Hearing loss    right ear, no hearing aid  . History of blood transfusion    [name] - unsure number of units transfused  . Hyperlipidemia   . Hypertension   . Hypothyroidism   . Kidney stones   . Osteoarthritis, knee    knees - otc med prn  . SVD (spontaneous vaginal delivery)    x 4  . Varicose veins    lower legs    Patient Active Problem List   Diagnosis Date Noted  . Acute diastolic CHF (congestive heart failure) ([place]) 05/07/2017  . Bilateral lower extremity edema 05/06/2017  . Osteoporosis 01/26/2017  . Degenerative disc disease, lumbar 12/29/2016  . Neck pain on left side 12/12/2016  . Leg cramps 06/05/2016  . General weakness 03/04/2016  . Hypoglycemia 12/17/2015  . RLS (restless legs syndrome) 12/17/2015  . Weight loss 12/17/2015   . Back pain 10/16/2015  . Right-sided chest pain 03/27/2015  . Diarrhea 03/27/2015  . Hoarseness 03/27/2015  . Loss of weight 03/27/2015  . Depression 01/07/2015  . Chronic kidney disease (CKD), stage IV (severe) ([place]) 01/07/2015  . AKI (acute kidney injury) ([place]) 10/09/2014  . Uterine prolaps 09/01/2013  . Peripheral edema 02/14/2013  . Allergic rhinitis 02/14/2013  . Obesity, Class II, BMI 35-39.9 05/12/2011  . Preventative health care 05/12/2011  . TINNITUS 01/09/2009  . Essential hypertension 05/05/2007  . Hypothyroidism 02/28/2007  . Diabetes ([place]) 02/28/2007  . Hyperlipidemia 02/28/2007  . Osteoarthrosis, unspecified whether generalized or localized, involving lower leg 02/28/2007    Past Surgical History:  Procedure Laterality Date  . ABDOMINAL HYSTERECTOMY    . ANTERIOR AND POSTERIOR REPAIR N/A 04/17/2014   Procedure: ANTERIOR (CYSTOCELE) AND POSTERIOR REPAIR (RECTOCELE);  Surgeon: Cheri Fowler, MD;  Location: [facility];  Service: Gynecology;  Laterality: N/A;  . APPENDECTOMY    . CATARACT EXTRACTION Bilateral   . COLONOSCOPY    . KIDNEY STONE SURGERY     removal of stone  . REPLACEMENT TOTAL KNEE Bilateral   . TONSILLECTOMY    . WISDOM TOOTH EXTRACTION      OB History    No data available       Home Medications    Prior to Admission medications   Medication  Sig Start Date End Date Taking? Authorizing Provider  cetirizine (ZYRTEC) 10 MG tablet Take 10 mg by mouth daily.   Yes [provider]  diclofenac sodium (VOLTAREN) 1 % GEL APPLY 4 GRAMS TOPICALLY FOUR TIMES DAILY as needed Patient taking differently: APPLY 4 GRAMS TOPICALLY BID TIMES DAILY AS NEEDED FOR PAIN 06/04/16  Yes Biagio Borg, MD  furosemide (LASIX) 40 MG tablet Take 40 mg by mouth 2 (two) times daily.    Yes [provider]  gabapentin (NEURONTIN) 100 MG capsule Take 2 capsules (200 mg total) by mouth at bedtime. Patient taking differently: Take 200 mg by mouth at bedtime as  needed (pain).  01/26/17  Yes Hulan Saas M, DO  JANUVIA 50 MG tablet TAKE ONE TABLET BY MOUTH ONCE DAILY 03/21/17  Yes Biagio Borg, MD  levothyroxine (SYNTHROID, LEVOTHROID) 88 MCG tablet TAKE ONE TABLET BY MOUTH ONCE DAILY Patient taking differently: TAKE 64mg TABLET BY MOUTH ONCE DAILY 12/04/15  Yes JBiagio Borg MD  lovastatin (MEVACOR) 40 MG tablet Take 1 tablet (40 mg total) by mouth at bedtime. 05/07/16  Yes JBiagio Borg MD  Oxymetazoline HCl (NASAL SPRAY NA) Place 1 spray into the nose 2 (two) times daily.   Yes [provider]  Vitamin D, Ergocalciferol, (DRISDOL) 50000 units CAPS capsule Take 1 capsule (50,000 Units total) by mouth every 7 (seven) days. 03/09/17  Yes SLyndal Pulley DO  Blood Glucose Monitoring Suppl (ONE TOUCH ULTRA 2) w/Device KIT Use as directed once per day 02/20/16   JBiagio Borg MD  Lancets MISC Use as directed once per day 02/20/16   JBiagio Borg MD  [facility] TEST test strip USE AS DIRECTED 02/22/17   JBiagio Borg MD  oxyCODONE-acetaminophen (PERCOCET/ROXICET) 5-325 MG tablet Take 1 tablet by mouth every 4 (four) hours as needed for severe pain. Patient not taking: Reported on 05/06/2017 01/31/17   BMalvin Johns MD    Family History Family History  Problem Relation Age of Onset  . Diabetes Mother   . Diabetes Father   . Hypertension Father   . Breast cancer Daughter   . Diabetes Brother        x 1  . Diabetes Sister        x 4  . Colon cancer Neg Hx   . Esophageal cancer Neg Hx   . Pancreatic cancer Neg Hx   . Liver disease Neg Hx   . Kidney disease Neg Hx     Social History Social History   Tobacco Use  . Smoking status: Former Smoker    Packs/day: 1.00    Years: 16.00    Pack years: 16.00    Types: Cigarettes    Last attempt to quit: 05/10/1992    Years since quitting: 25.0  . Smokeless tobacco: Never Used  Substance Use Topics  . Alcohol use: No    Alcohol/week: 0.0 oz  . Drug use: No     Allergies     Penicillins   Review of Systems Review of Systems  All other systems reviewed and are negative.    Physical Exam Updated Vital Signs BP (!) 133/58 (BP Location: Left Arm)   Pulse 86   Temp 97.8 F (36.6 C) (Oral)   Resp 16   Ht '5\' 4"'$  (1.626 m)   Wt 80.9 kg (178 lb 5.6 oz)   SpO2 100%   BMI 30.61 kg/m   Physical Exam  Constitutional: She is oriented  to person, place, and time. She appears well-developed and well-nourished.  HENT:  Head: Normocephalic and atraumatic.  Cardiovascular: Normal rate and regular rhythm.  No murmur heard. Pulmonary/Chest: Effort normal and breath sounds normal. No respiratory distress.  Abdominal: Soft. There is no tenderness. There is no rebound and no guarding.  Musculoskeletal: She exhibits no tenderness.  3+ pitting edema to BLE.  2+ DP pulses bilaterally.    Neurological: She is alert and oriented to person, place, and time.  Skin: Skin is warm and dry.  Psychiatric: She has a normal mood and affect. Her behavior is normal.  Nursing note and vitals reviewed.    ED Treatments / Results  Labs (all labs ordered are listed, but only abnormal results are displayed) Labs Reviewed  BASIC METABOLIC PANEL - Abnormal; Notable for the following components:      Result Value   Glucose, Bld 148 (*)    BUN 130 (*)    Creatinine, Ser 2.83 (*)    GFR calc non Af Amer 14 (*)    GFR calc Af Amer 17 (*)    All other components within normal limits  CBC - Abnormal; Notable for the following components:   RBC 3.58 (*)    Hemoglobin 10.8 (*)    HCT 33.0 (*)    All other components within normal limits  URINALYSIS, ROUTINE W REFLEX MICROSCOPIC - Abnormal; Notable for the following components:   Color, Urine STRAW (*)    Squamous Epithelial / LPF 0-5 (*)    All other components within normal limits  BRAIN NATRIURETIC PEPTIDE - Abnormal; Notable for the following components:   B Natriuretic Peptide 121.3 (*)    All other components within normal  limits  TROPONIN I  HEPATIC FUNCTION PANEL  TROPONIN I  TROPONIN I  SODIUM, URINE, RANDOM  CREATININE, URINE, RANDOM  UREA NITROGEN, URINE  CBC WITH DIFFERENTIAL/PLATELET  RENAL FUNCTION PANEL  TSH  PREALBUMIN  I-STAT TROPONIN, ED    EKG  EKG Interpretation  Date/Time:  Friday May 06 2017 21:03:23 EST Ventricular Rate:  78 PR Interval:    QRS Duration: 92 QT Interval:  391 QTC Calculation: 446 R Axis:   -2 Text Interpretation:  Sinus rhythm Atrial premature complexes Confirmed by Quintella Reichert [phone]) on 05/06/2017 11:11:38 PM       Radiology Dg Chest 2 View  Result Date: 05/06/2017 CLINICAL DATA:  Progressive shortness of breath for 5 days. Leg swelling for 3 days. EXAM: CHEST  2 VIEW COMPARISON:  Chest radiograph February 25, 2016 FINDINGS: The cardiac silhouette is mildly enlarged. Tortuous, possibly ectatic aorta. Calcified aortic knob. Similar pulmonary vascular congestion without pleural effusion or focal consolidation mild chronic interstitial changes. No pneumothorax. Soft tissue planes included osseous structures are nonsuspicious. Osteopenia. Surgical clip projects in the abdomen. IMPRESSION: Stable cardiomegaly and pulmonary vascular congestion. Mild chronic interstitial changes. Aortic Atherosclerosis (ICD10-I70.0). Electronically Signed   By: Elon Alas M.D.   On: 05/06/2017 21:08   US Renal  Result Date: 05/06/2017 CLINICAL DATA:  Renal failure. EXAM: RENAL / URINARY TRACT ULTRASOUND COMPLETE COMPARISON:  None. FINDINGS: Right Kidney: Length: 9.2 cm. The right renal pelvis is prominent/distended. No caliectasis. An 8 mm cyst is identified. Left Kidney: Length: 10.4 cm.  A 7.2 cm cyst is identified. Bladder: Bilateral ureteral jets identified. The bladder is grossly unremarkable. IMPRESSION: 1. There is a fluid-filled prominent right renal pelvis which is similar since 2016. No caliectasis. The kidneys are otherwise normal other than renal cysts  as  above. Electronically Signed   By: Dorise Bullion III M.D   On: 05/06/2017 23:03    Procedures Procedures (including critical care time)  Medications Ordered in ED Medications  loratadine (CLARITIN) tablet 10 mg (not administered)  diclofenac sodium (VOLTAREN) 1 % transdermal gel 4 g (not administered)  gabapentin (NEURONTIN) capsule 200 mg (200 mg Oral Given 05/07/17 0110)  levothyroxine (SYNTHROID, LEVOTHROID) tablet 88 mcg (not administered)  pravastatin (PRAVACHOL) tablet 40 mg (not administered)  sodium chloride (OCEAN) 0.65 % nasal spray 1 spray (not administered)  sodium chloride flush (NS) 0.9 % injection 3 mL (3 mLs Intravenous Given 05/07/17 0112)  sodium chloride flush (NS) 0.9 % injection 3 mL (not administered)  0.9 %  sodium chloride infusion (not administered)  ondansetron (ZOFRAN) injection 4 mg (not administered)  heparin injection 5,000 Units (5,000 Units Subcutaneous Not Given 05/07/17 0111)  insulin aspart (novoLOG) injection 0-9 Units (not administered)  acetaminophen (TYLENOL) tablet 650 mg (not administered)  levalbuterol (XOPENEX) nebulizer solution 0.63 mg (0.63 mg Nebulization Given 05/07/17 0047)  pantoprazole (PROTONIX) injection 40 mg (40 mg Intravenous Given 05/07/17 0111)     Initial Impression / Assessment and Plan / ED Course  I have reviewed the triage vital signs and the nursing notes.  Pertinent labs & imaging results that were available during my care of the patient were reviewed by me and considered in my medical decision making (see chart for details).     Patient with history of CKD here for progressive lower extremity edema despite increased doses of her Lasix.  She is edematous on examination with no respiratory distress.  Discussed with nephrologist on call who will see the patient in consult tomorrow.  Hospitalist consulted for admission for further workup and treatment.  Final Clinical Impressions(s) / ED Diagnoses   Final diagnoses:    None    ED Discharge Orders    None       Quintella Reichert, MD 05/07/17 [phone]

## 2017-05-07 ENCOUNTER — Inpatient Hospital Stay (HOSPITAL_COMMUNITY): Payer: Medicare Other

## 2017-05-07 DIAGNOSIS — I361 Nonrheumatic tricuspid (valve) insufficiency: Secondary | ICD-10-CM

## 2017-05-07 DIAGNOSIS — E038 Other specified hypothyroidism: Secondary | ICD-10-CM

## 2017-05-07 DIAGNOSIS — I5031 Acute diastolic (congestive) heart failure: Secondary | ICD-10-CM | POA: Diagnosis present

## 2017-05-07 LAB — IRON AND TIBC
Iron: 37 ug/dL (ref 28–170)
Saturation Ratios: 9 % — ABNORMAL LOW (ref 10.4–31.8)
TIBC: 405 ug/dL (ref 250–450)
UIBC: 368 ug/dL

## 2017-05-07 LAB — CBC WITH DIFFERENTIAL/PLATELET
Basophils Absolute: 0 10*3/uL (ref 0.0–0.1)
Basophils Relative: 0 %
EOS ABS: 0.3 10*3/uL (ref 0.0–0.7)
Eosinophils Relative: 4 %
HEMATOCRIT: 30.2 % — AB (ref 36.0–46.0)
HEMOGLOBIN: 9.9 g/dL — AB (ref 12.0–15.0)
LYMPHS ABS: 2.6 10*3/uL (ref 0.7–4.0)
Lymphocytes Relative: 46 %
MCH: 30.4 pg (ref 26.0–34.0)
MCHC: 32.8 g/dL (ref 30.0–36.0)
MCV: 92.6 fL (ref 78.0–100.0)
MONO ABS: 0.4 10*3/uL (ref 0.1–1.0)
MONOS PCT: 8 %
NEUTROS ABS: 2.4 10*3/uL (ref 1.7–7.7)
NEUTROS PCT: 42 %
Platelets: 216 10*3/uL (ref 150–400)
RBC: 3.26 MIL/uL — ABNORMAL LOW (ref 3.87–5.11)
RDW: 14.4 % (ref 11.5–15.5)
WBC: 5.8 10*3/uL (ref 4.0–10.5)

## 2017-05-07 LAB — RENAL FUNCTION PANEL
ANION GAP: 10 (ref 5–15)
Albumin: 3.1 g/dL — ABNORMAL LOW (ref 3.5–5.0)
BUN: 115 mg/dL — ABNORMAL HIGH (ref 6–20)
CO2: 25 mmol/L (ref 22–32)
Calcium: 8.5 mg/dL — ABNORMAL LOW (ref 8.9–10.3)
Chloride: 103 mmol/L (ref 101–111)
Creatinine, Ser: 2.44 mg/dL — ABNORMAL HIGH (ref 0.44–1.00)
GFR calc Af Amer: 20 mL/min — ABNORMAL LOW (ref >60)
GFR calc non Af Amer: 17 mL/min — ABNORMAL LOW (ref >60)
GLUCOSE: 150 mg/dL — AB (ref 65–99)
POTASSIUM: 4.6 mmol/L (ref 3.5–5.1)
Phosphorus: 5.4 mg/dL — ABNORMAL HIGH (ref 2.5–4.6)
Sodium: 138 mmol/L (ref 135–145)

## 2017-05-07 LAB — SODIUM, URINE, RANDOM: SODIUM UR: 111 mmol/L

## 2017-05-07 LAB — HEPATIC FUNCTION PANEL
ALBUMIN: 3.6 g/dL (ref 3.5–5.0)
ALK PHOS: 42 U/L (ref 38–126)
ALT: 19 U/L (ref 14–54)
AST: 22 U/L (ref 15–41)
Bilirubin, Direct: <0.1 mg/dL — ABNORMAL LOW (ref 0.1–0.5)
TOTAL PROTEIN: 7.2 g/dL (ref 6.5–8.1)
Total Bilirubin: 0.7 mg/dL (ref 0.3–1.2)

## 2017-05-07 LAB — ECHOCARDIOGRAM COMPLETE
HEIGHTINCHES: 64 in
Weight: 2814.83 oz

## 2017-05-07 LAB — PREALBUMIN: PREALBUMIN: 25.9 mg/dL (ref 18–38)

## 2017-05-07 LAB — GLUCOSE, CAPILLARY
GLUCOSE-CAPILLARY: 108 mg/dL — AB (ref 65–99)
GLUCOSE-CAPILLARY: 108 mg/dL — AB (ref 65–99)
GLUCOSE-CAPILLARY: 189 mg/dL — AB (ref 65–99)

## 2017-05-07 LAB — BRAIN NATRIURETIC PEPTIDE: B Natriuretic Peptide: 121.3 pg/mL — ABNORMAL HIGH (ref 0.0–100.0)

## 2017-05-07 LAB — TROPONIN I: Troponin I: <0.03 ng/mL (ref <0.03)

## 2017-05-07 LAB — TSH: TSH: 3.534 u[IU]/mL (ref 0.350–4.500)

## 2017-05-07 LAB — FERRITIN: FERRITIN: 22 ng/mL (ref 11–307)

## 2017-05-07 LAB — CREATININE, URINE, RANDOM: Creatinine, Urine: 22.45 mg/dL

## 2017-05-07 MED ORDER — PANTOPRAZOLE SODIUM 40 MG IV SOLR
40.0000 mg | Freq: Once | INTRAVENOUS | Status: AC
  Administered 2017-05-07: 40 mg via INTRAVENOUS
  Filled 2017-05-07: qty 40

## 2017-05-07 MED ORDER — FUROSEMIDE 10 MG/ML IJ SOLN
40.0000 mg | Freq: Two times a day (BID) | INTRAMUSCULAR | Status: DC
  Administered 2017-05-07 – 2017-05-09 (×4): 40 mg via INTRAVENOUS
  Filled 2017-05-07 (×4): qty 4

## 2017-05-07 MED ORDER — ACETAMINOPHEN 325 MG PO TABS
650.0000 mg | ORAL_TABLET | ORAL | Status: DC | PRN
  Administered 2017-05-08 – 2017-05-10 (×2): 650 mg via ORAL
  Filled 2017-05-07 (×2): qty 2

## 2017-05-07 NOTE — Progress Notes (Signed)
  Echocardiogram 2D Echocardiogram has been performed.  Roosvelt MaserLane, Tomio Kirk F 05/07/2017, 2:01 PM

## 2017-05-07 NOTE — Consult Note (Signed)
Renal Service Consult Note [facility]  Michelle Morton 05/07/2017 [place] Requesting Physician:  Dr Daleen Bo  Reason for Consult:  Patient with bilat LE edema HPI: The patient is a 83 y.o. year-old came to ED yesterday with ^'d bilat LE swelling.  She doubled her lasix on Tues and Wed but w/o relief.  DOE also.  No CP.  In ED creat was up at 2.8 (baseline creat = 2).  Pt was admitted with dx of decomp CHF.  Today creat is down to 2.4, had 900 cc out since admit w/o lasix IV.  Asked to see for acute / CRF.    Patient is breathing much better, "my ankles have gone down", "you shoulda seen them last night".  No CP.  She sees Dr Arleta Creek in the office for CKD, she has taken HD classes but never have they talked about starting HD.    Pt is separated, lives w/ her daughter in [place].  No tob/ etoh.    Date    Creat  eGFR 2008- 11  0.7- 0.9 2012- 15  1.0- 1.2 2016   1.6- 2.1 24 2017   1.8- 2.2 25 May 06, 2017  2.83  17 May 07, 2017  2.44  20    ROS  denies CP  no joint pain   no HA  no blurry vision  no rash  no diarrhea  no nausea/ vomiting  no dysuria  no difficulty voiding  no change in urine color    Past Medical History  Past Medical History:  Diagnosis Date  . Allergic rhinitis, cause unspecified 02/14/2013  . Arthritis   . Arthus phenomenon   . Diabetes mellitus, type 2 ([place])   . Hearing loss    right ear, no hearing aid  . History of blood transfusion    [name] - unsure number of units transfused  . Hyperlipidemia   . Hypertension   . Hypothyroidism   . Kidney stones   . Osteoarthritis, knee    knees - otc med prn  . SVD (spontaneous vaginal delivery)    x 4  . Varicose veins    lower legs   Past Surgical History  Past Surgical History:  Procedure Laterality Date  . ABDOMINAL HYSTERECTOMY    . ANTERIOR AND POSTERIOR REPAIR N/A 04/17/2014   Procedure: ANTERIOR (CYSTOCELE) AND POSTERIOR REPAIR (RECTOCELE);  Surgeon: Cheri Fowler, MD;   Location: [facility];  Service: Gynecology;  Laterality: N/A;  . APPENDECTOMY    . CATARACT EXTRACTION Bilateral   . COLONOSCOPY    . KIDNEY STONE SURGERY     removal of stone  . REPLACEMENT TOTAL KNEE Bilateral   . TONSILLECTOMY    . WISDOM TOOTH EXTRACTION     Family History  Family History  Problem Relation Age of Onset  . Diabetes Mother   . Diabetes Father   . Hypertension Father   . Breast cancer Daughter   . Diabetes Brother        x 1  . Diabetes Sister        x 4  . Colon cancer Neg Hx   . Esophageal cancer Neg Hx   . Pancreatic cancer Neg Hx   . Liver disease Neg Hx   . Kidney disease Neg Hx    Social History  reports that she quit smoking about 25 years ago. Her smoking use included cigarettes. She has a 16.00 pack-year smoking history. she has never used smokeless tobacco.  She reports that she does not drink alcohol or use drugs. Allergies  Allergies  Allergen Reactions  . Penicillins Rash    Allergic to IV penicillin but tolerates the oral form Has patient had a PCN reaction causing immediate rash, facial/tongue/throat swelling, SOB or lightheadedness with hypotension: no Has patient had a PCN reaction causing severe rash involving mucus membranes or skin necrosis: no Has patient had a PCN reaction that required hospitalization already in the hospital for other procedure  Has patient had a PCN reaction occurring within the last 10 years: no If all of the above answers are "NO", then ma   Home medications Prior to Admission medications   Medication Sig Start Date End Date Taking? Authorizing Provider  cetirizine (ZYRTEC) 10 MG tablet Take 10 mg by mouth daily.   Yes [provider]  diclofenac sodium (VOLTAREN) 1 % GEL APPLY 4 GRAMS TOPICALLY FOUR TIMES DAILY as needed Patient taking differently: APPLY 4 GRAMS TOPICALLY BID TIMES DAILY AS NEEDED FOR PAIN 06/04/16  Yes Biagio Borg, MD  furosemide (LASIX) 40 MG tablet Take 40 mg by mouth 2 (two) times  daily.    Yes [provider]  gabapentin (NEURONTIN) 100 MG capsule Take 2 capsules (200 mg total) by mouth at bedtime. Patient taking differently: Take 200 mg by mouth at bedtime as needed (pain).  01/26/17  Yes Hulan Saas M, DO  JANUVIA 50 MG tablet TAKE ONE TABLET BY MOUTH ONCE DAILY 03/21/17  Yes Biagio Borg, MD  levothyroxine (SYNTHROID, LEVOTHROID) 88 MCG tablet TAKE ONE TABLET BY MOUTH ONCE DAILY Patient taking differently: TAKE 84mg TABLET BY MOUTH ONCE DAILY 12/04/15  Yes JBiagio Borg MD  lovastatin (MEVACOR) 40 MG tablet Take 1 tablet (40 mg total) by mouth at bedtime. 05/07/16  Yes JBiagio Borg MD  Oxymetazoline HCl (NASAL SPRAY NA) Place 1 spray into the nose 2 (two) times daily.   Yes [provider]  Vitamin D, Ergocalciferol, (DRISDOL) 50000 units CAPS capsule Take 1 capsule (50,000 Units total) by mouth every 7 (seven) days. 03/09/17  Yes SLyndal Pulley DO  Blood Glucose Monitoring Suppl (ONE TOUCH ULTRA 2) w/Device KIT Use as directed once per day 02/20/16   JBiagio Borg MD  Lancets MISC Use as directed once per day 02/20/16   JBiagio Borg MD  [facility] TEST test strip USE AS DIRECTED 02/22/17   JBiagio Borg MD  oxyCODONE-acetaminophen (PERCOCET/ROXICET) 5-325 MG tablet Take 1 tablet by mouth every 4 (four) hours as needed for severe pain. Patient not taking: Reported on 05/06/2017 01/31/17   BMalvin Johns MD   Liver Function Tests Recent Labs  Lab 05/06/17 2328 05/07/17 0502  AST 22  --   ALT 19  --   ALKPHOS 42  --   BILITOT 0.7  --   PROT 7.2  --   ALBUMIN 3.6 3.1*   No results for input(s): LIPASE, AMYLASE in the last 168 hours. CBC Recent Labs  Lab 05/06/17 1754 05/07/17 0502  WBC 7.0 5.8  NEUTROABS  --  2.4  HGB 10.8* 9.9*  HCT 33.0* 30.2*  MCV 92.2 92.6  PLT 252 2737  Basic Metabolic Panel Recent Labs  Lab 05/06/17 1754 05/07/17 0502  NA 137 138  K 4.5 4.6  CL 102 103  CO2 23 25  GLUCOSE 148* 150*  BUN 130*  115*  CREATININE 2.83* 2.44*  CALCIUM 9.0 8.5*  PHOS  --  5.4*  Iron/TIBC/Ferritin/ %Sat No results found for: IRON, TIBC, FERRITIN, IRONPCTSAT  Vitals:   05/07/17 0047 05/07/17 0500 05/07/17 0623 05/07/17 1325  BP:   (!) 122/59 (!) 143/60  Pulse:   71 73  Resp:   16 18  Temp:   (!) 97.4 F (36.3 C) 97.6 F (36.4 C)  TempSrc:   Oral Oral  SpO2: 100%  100% 100%  Weight:  79.8 kg (175 lb 14.8 oz)    Height:       Exam Gen alert, lying 30 deg, no distress No rash, cyanosis or gangrene Sclera anicteric, throat clear  No jvd or bruits Chest clear bilat to bases RRR no MRG Abd soft ntnd no mass or ascites +bs GU defer MS no joint effusions or deformity Ext 1+ bilat LE pitting pretib edema / no wounds or ulcers Neuro is alert, Ox 3 , nf  CXR 12/7 > question vasc congestion, no edema  UA > 12.7  Negative UNa 111  UCreat 22.4  Renal US > R kidney 9.2 cm, the right renal pelvis is prominent/distended. No caliectasis. An 8 mm cyst is identified. L kidney 10.4 cm.  A 7.2 cm cyst is identified. Bladder: bilateral ureteral jets identified. The bladder is grossly unremarkable.  ECHO Mar 2018 > LVEF 55- 60%, G1DD, severe LAE, mild TR, mil  HOme meds: -lasix 40 bid -percocet 5-325 prn/ voltaren 1% gel/ zyrtec 10 qd prn/ neurontin 200 qhs prn -januvia 50 mg qd -mevacor/ T4/ vit D    Impression: 1. Acute on CRF - improving, will diuresis a bit.   2. Acute diast CHF - says she is doing better today.  3. CKD stage IV - eGFR baseline is 20- 30% 4. DM2 - januvia 5. HL - statin   Plan - give IV lasix 40 mg q 12, get more fluid off if possible  Kelly Splinter MD [place] pager [phone]   05/07/2017, 3:42 PM

## 2017-05-07 NOTE — Progress Notes (Signed)
[facility] PROGRESS NOTE  Michelle KnudsenMamie Morton Copher ZOX:[id number] DOB: [birth year] DOA: 05/06/2017 PCP: [name], [name], MD  Brief summary   83 y.o. female with medical history significant of HTN, HLD, hypothyroidism, DM type II, diastolic dysfunction last EF noted to be 55-60% with grade 1 dFx on 07/2016, and CKD stage IV; who presents with complaints of large who presents with complaints of progressive leg swelling and weight gain.  Per review of records at her PCP office today she weighed 183, and previously only weight 168 pounds on office visit from 03/09/2017.  Patient notes that she still able to urinate.   ED Course: Upon admission emergency department patient patient was noted to have relatively normal vital signs except for blood pressures noted to be 146/72-154/70.  Labs revealed WBC 7, Hbg 10.8, BUN 130, Cr 2.83, and glucose 148.  Dr. [place]acy [place] of nephrology was consulted, and will see the patient in a.m.  Recommended checking albumin level and discouraged any additional diuretic use.  [facility] called to admit.  Assessment/Plan:  Acute on chronic Diastolic CHF/fluid overload. Worsening with renal failure. Patient appears to be fluid overloaded on physical exam. Chest x-ray showing cardiomegaly that appears stable with pulmonary vascular congestion and chronic interstitial changes.  Last EF noted to be 55-60% with grade 1 diastolic dysfunction in 07/2016.   -Per ED: Nephrology recommended holding on any further diuresis at this time. Patient will likely need more aggressive diuresis with fluid overload. Defer to nephrology monitor Strict I&Os. Daily weights. Not on ACE due to renal issues. Will consider BB, hydralazine/ntg if BP tolerates   Probably acute renal failure on chronic kidney disease IV: Patient's baseline creatinine had previously been noted to be around 2, but she presents with a creatinine of 2.83 with BUN 130. Consulted nephrology.   Acute bronchitis: Afebrile. CXR: no clear  infiltrates. Cont Trial of Levalbuterol of breathing treatment. No significant wheezing on exam. hold steroids   Anemia of chronic disease: Patient with a hemoglobin of 10.8 on admission normally patient hemoglobin appears to be 11 g/dL or higher.  With the elevated BUN questioned the possibility of GI bleed.  -But no s/s of acute gi bleed. Will check stool occult blood test. Check iron profile. Will hold heparin for now. Monitor   Essential hypertension: Patient currently not on any blood pressure medications. Continue to monitor  Diabetes mellitus type 2: hemoglobin A1c on file noted to be 6.1 on 12/10/2016. Monitor on ISS.    Hypothyroidism: Last TSH noted to be 3.32 on 01/26/2017. Continue levothyroxine  Hyperlipidemia Continue pharmacy substitution for lovastatin  Code Status: full Family Communication: d/Morton patient, RN (indicate person spoken with, relationship, and if by phone, the number) Disposition Plan: home 2-3 days    Consultants:  Nephrology   Procedures:  none  Antibiotics:  none (indicate start date, and stop date if known)  HPI/Subjective: Alert. Reports feeling better. No acute distress. Still edematous   Objective: Vitals:   05/07/17 0047 05/07/17 0623  BP:  (!) 122/59  Pulse:  71  Resp:  16  Temp:  (!) 97.4 F (36.3 C)  SpO2: 100% 100%    Intake/Output Summary (Last 24 hours) at 05/07/2017 1007 Last data filed at 05/07/2017 0626 Gross per 24 hour  Intake -  Output 600 ml  Net -600 ml   Filed Weights   05/07/17 0039 05/07/17 0500  Weight: 80.9 kg (178 lb 5.6 oz) 79.8 kg (175 lb 14.8 oz)    Exam:   General:  No distress   Cardiovascular: s1,s2 rrr+mild elevated JVD  Respiratory: few crackles LL  Abdomen: soft, nt ,t   Musculoskeletal: leg edema    Data Reviewed: Basic Metabolic Panel: Recent Labs  Lab 05/06/17 1754 05/07/17 0502  NA 137 138  K 4.5 4.6  CL 102 103  CO2 23 25  GLUCOSE 148* 150*  BUN 130* 115*  CREATININE  2.83* 2.44*  CALCIUM 9.0 8.5*  PHOS  --  5.4*   Liver Function Tests: Recent Labs  Lab 05/06/17 2328 05/07/17 0502  AST 22  --   ALT 19  --   ALKPHOS 42  --   BILITOT 0.7  --   PROT 7.2  --   ALBUMIN 3.6 3.1*   No results for input(s): LIPASE, AMYLASE in the last 168 hours. No results for input(s): AMMONIA in the last 168 hours. CBC: Recent Labs  Lab 05/06/17 1754 05/07/17 0502  WBC 7.0 5.8  NEUTROABS  --  2.4  HGB 10.8* 9.9*  HCT 33.0* 30.2*  MCV 92.2 92.6  PLT 252 216   Cardiac Enzymes: Recent Labs  Lab 05/06/17 2328 05/07/17 0502  TROPONINI <0.03 <0.03   BNP (last 3 results) Recent Labs    05/06/17 2328  BNP 121.3*    ProBNP (last 3 results) No results for input(s): PROBNP in the last 8760 hours.  CBG: No results for input(s): GLUCAP in the last 168 hours.  No results found for this or any previous visit (from the past 240 hour(s)).   Studies: Dg Chest 2 View  Result Date: 05/06/2017 CLINICAL DATA:  Progressive shortness of breath for 5 days. Leg swelling for 3 days. EXAM: CHEST  2 VIEW COMPARISON:  Chest radiograph February 25, 2016 FINDINGS: The cardiac silhouette is mildly enlarged. Tortuous, possibly ectatic aorta. Calcified aortic knob. Similar pulmonary vascular congestion without pleural effusion or focal consolidation mild chronic interstitial changes. No pneumothorax. Soft tissue planes included osseous structures are nonsuspicious. Osteopenia. Surgical clip projects in the abdomen. IMPRESSION: Stable cardiomegaly and pulmonary vascular congestion. Mild chronic interstitial changes. Aortic Atherosclerosis (ICD10-I70.0). Electronically Signed   By: Awilda Metro M.D.   On: 05/06/2017 21:08   US Renal  Result Date: 05/06/2017 CLINICAL DATA:  Renal failure. EXAM: RENAL / URINARY TRACT ULTRASOUND COMPLETE COMPARISON:  None. FINDINGS: Right Kidney: Length: 9.2 cm. The right renal pelvis is prominent/distended. No caliectasis. An 8 mm cyst is  identified. Left Kidney: Length: 10.4 cm.  A 7.2 cm cyst is identified. Bladder: Bilateral ureteral jets identified. The bladder is grossly unremarkable. IMPRESSION: 1. There is a fluid-filled prominent right renal pelvis which is similar since 2016. No caliectasis. The kidneys are otherwise normal other than renal cysts as above. Electronically Signed   By: Gerome Sam III M.D   On: 05/06/2017 23:03    Scheduled Meds: . heparin  5,000 Units Subcutaneous Q8H  . insulin aspart  0-9 Units Subcutaneous TID WC  . levothyroxine  88 mcg Oral QAC breakfast  . loratadine  10 mg Oral Daily  . pravastatin  40 mg Oral q1800  . sodium chloride flush  3 mL Intravenous Q12H   Continuous Infusions: . sodium chloride      Principal Problem:   Acute diastolic CHF (congestive heart failure) (HCC) Active Problems:   Hypothyroidism   Diabetes (HCC)   Bilateral lower extremity edema    Time spent: >35 minutes     Esperanza Sheets  [facility] Pager [phone]. If 7PM-7AM, please contact night-coverage at  [email or web address], password [email or web address] 05/07/2017, 10:07 AM  LOS: 1 day

## 2017-05-08 DIAGNOSIS — I5031 Acute diastolic (congestive) heart failure: Secondary | ICD-10-CM

## 2017-05-08 LAB — CBC
HCT: 33.6 % — ABNORMAL LOW (ref 36.0–46.0)
HEMOGLOBIN: 10.8 g/dL — AB (ref 12.0–15.0)
MCH: 29.9 pg (ref 26.0–34.0)
MCHC: 32.1 g/dL (ref 30.0–36.0)
MCV: 93.1 fL (ref 78.0–100.0)
PLATELETS: 251 10*3/uL (ref 150–400)
RBC: 3.61 MIL/uL — AB (ref 3.87–5.11)
RDW: 14.4 % (ref 11.5–15.5)
WBC: 5.9 10*3/uL (ref 4.0–10.5)

## 2017-05-08 LAB — GLUCOSE, CAPILLARY
GLUCOSE-CAPILLARY: 206 mg/dL — AB (ref 65–99)
GLUCOSE-CAPILLARY: 77 mg/dL (ref 65–99)
GLUCOSE-CAPILLARY: 114 mg/dL — AB (ref 65–99)
GLUCOSE-CAPILLARY: 209 mg/dL — AB (ref 65–99)

## 2017-05-08 LAB — UREA NITROGEN, URINE: Urea Nitrogen, Ur: 414 mg/dL

## 2017-05-08 LAB — OCCULT BLOOD X 1 CARD TO LAB, STOOL: FECAL OCCULT BLD: NEGATIVE

## 2017-05-08 LAB — BASIC METABOLIC PANEL
ANION GAP: 11 (ref 5–15)
BUN: 108 mg/dL — ABNORMAL HIGH (ref 6–20)
CALCIUM: 8.8 mg/dL — AB (ref 8.9–10.3)
CO2: 26 mmol/L (ref 22–32)
CREATININE: 2.15 mg/dL — AB (ref 0.44–1.00)
Chloride: 103 mmol/L (ref 101–111)
GFR calc non Af Amer: 20 mL/min — ABNORMAL LOW (ref >60)
GFR, EST AFRICAN AMERICAN: 23 mL/min — AB (ref >60)
Glucose, Bld: 97 mg/dL (ref 65–99)
Potassium: 4.9 mmol/L (ref 3.5–5.1)
SODIUM: 140 mmol/L (ref 135–145)

## 2017-05-08 MED ORDER — TRAZODONE HCL 50 MG PO TABS: 50.0000 mg | ORAL_TABLET | Freq: Once | ORAL | Status: DC
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
## 2017-05-08 NOTE — Progress Notes (Signed)
[facility] PROGRESS NOTE  Michelle KnudsenMamie W Morton ZOX:[id number] DOB: Feb 28, 1932 DOA: 05/06/2017 PCP: [name], [name], MD    Brief summary   83 y.o. female with medical history significant of HTN, HLD, hypothyroidism, DM type II, diastolic dysfunction last EF noted to be 55-60% with grade 1 dFx on 07/2016, and CKD stage IV; who presents with complaints of large who presents with complaints of progressive leg swelling and weight gain.  Per review of records at her PCP office today she weighed 183, and previously only weight 168 pounds on office visit from 03/09/2017.  Patient notes that she still able to urinate.   ED Course: Upon admission emergency department patient patient was noted to have relatively normal vital signs except for blood pressures noted to be 146/72-154/70.  Labs revealed WBC 7, Hbg 10.8, BUN 130, Cr 2.83, and glucose 148.  Dr. [place] of nephrology was consulted, and will see the patient in a.m.  Recommended checking albumin level and discouraged any additional diuretic use.  [facility] called to admit.  Assessment/Plan:  Acute on chronic Diastolic CHF/fluid overload. Worsening with renal failure. Patient appears to be fluid overloaded on physical exam. Chest x-ray showing cardiomegaly that appears stable with pulmonary vascular congestion and chronic interstitial changes.  Last EF noted to be 55-60% with grade 1 diastolic dysfunction in 07/2016.   - has been on lasix  - swelling overall better and pt says she feels better - this is the weight trend in the past 72 hours  Filed Weights   05/07/17 0039 05/07/17 0500 05/08/17 0609  Weight: 80.9 kg (178 lb 5.6 oz) 79.8 kg (175 lb 14.8 oz) 78.3 kg (172 lb 9.9 oz)  - monitor weights, I/O  Probably acute renal failure on chronic kidney disease IV: Patient's baseline creatinine had previously been noted to be around 2 - Cr trending down - BMP in AM  Acute bronchitis: Afebrile. CXR: no clear infiltrates. Cont Trial of Levalbuterol of  breathing treatment.  - resp status stable   Anemia of chronic disease: Patient with a hemoglobin of 10.8 on admission normally patient hemoglobin appears to be 11 g/dL or higher.  With the elevated BUN questioned the possibility of GI bleed.  - no signs of active bleeding - CBC in AM  Essential hypertension: SBP in 140's place on hydralazine for now and monitor clinical response   Diabetes mellitus type 2: hemoglobin A1c on file noted to be 6.1 on 12/10/2016. Monitor on ISS.    Hypothyroidism: Last TSH noted to be 3.32 on 01/26/2017. Continue synthroid   Hyperlipidemia cont statin   Code Status: full Family Communication: pt updated at bedside  Disposition Plan: home in 1-2 days   Consultants:  Nephrology   Procedures:  None   Antibiotics:  None  HPI/Subjective: Pt reports feeling better.   Objective: Vitals:   05/07/17 2151 05/08/17 0609  BP: 139/64 (!) 141/57  Pulse: 86 75  Resp: 18 18  Temp: 97.7 F (36.5 C) 98.4 F (36.9 C)  SpO2: 100% 99%    Intake/Output Summary (Last 24 hours) at 05/08/2017 0842 Last data filed at 05/08/2017 0610 Gross per 24 hour  Intake 0 ml  Output 2450 ml  Net -2450 ml   Filed Weights   05/07/17 0039 05/07/17 0500 05/08/17 0609  Weight: 80.9 kg (178 lb 5.6 oz) 79.8 kg (175 lb 14.8 oz) 78.3 kg (172 lb 9.9 oz)    Physical Exam  Constitutional: Appears calm, NAD Pulmonary: Effort and breath sounds normal, no  stridor  Abdominal: Soft. BS +,  no distension, tenderness, rebound or guarding.   Musculoskeletal: Normal range of motion. +1 bilateral LE edema   Data Reviewed: Basic Metabolic Panel: Recent Labs  Lab 05/06/17 1754 05/07/17 0502 05/08/17 0435  NA 137 138 140  K 4.5 4.6 4.9  CL 102 103 103  CO2 23 25 26   GLUCOSE 148* 150* 97  BUN 130* 115* 108*  CREATININE 2.83* 2.44* 2.15*  CALCIUM 9.0 8.5* 8.8*  PHOS  --  5.4*  --    Liver Function Tests: Recent Labs  Lab 05/06/17 2328 05/07/17 0502  AST 22  --    ALT 19  --   ALKPHOS 42  --   BILITOT 0.7  --   PROT 7.2  --   ALBUMIN 3.6 3.1*   CBC: Recent Labs  Lab 05/06/17 1754 05/07/17 0502 05/08/17 0435  WBC 7.0 5.8 5.9  NEUTROABS  --  2.4  --   HGB 10.8* 9.9* 10.8*  HCT 33.0* 30.2* 33.6*  MCV 92.2 92.6 93.1  PLT 252 216 251   Cardiac Enzymes: Recent Labs  Lab 05/06/17 2328 05/07/17 0502 05/07/17 1121  TROPONINI <0.03 <0.03 <0.03   BNP (last 3 results) Recent Labs    05/06/17 2328  BNP 121.3*     CBG: Recent Labs  Lab 05/07/17 1138 05/07/17 1636 05/07/17 2148  GLUCAP 108* 108* 189*    Studies: Dg Chest 2 View  Result Date: 05/06/2017 CLINICAL DATA:  Progressive shortness of breath for 5 days. Leg swelling for 3 days. EXAM: CHEST  2 VIEW COMPARISON:  Chest radiograph February 25, 2016 FINDINGS: The cardiac silhouette is mildly enlarged. Tortuous, possibly ectatic aorta. Calcified aortic knob. Similar pulmonary vascular congestion without pleural effusion or focal consolidation mild chronic interstitial changes. No pneumothorax. Soft tissue planes included osseous structures are nonsuspicious. Osteopenia. Surgical clip projects in the abdomen. IMPRESSION: Stable cardiomegaly and pulmonary vascular congestion. Mild chronic interstitial changes. Aortic Atherosclerosis (ICD10-I70.0). Electronically Signed   By: Awilda Metroourtnay  Bloomer M.D.   On: 05/06/2017 21:08   [place] Renal  Result Date: 05/06/2017 CLINICAL DATA:  Renal failure. EXAM: RENAL / URINARY TRACT ULTRASOUND COMPLETE COMPARISON:  None. FINDINGS: Right Kidney: Length: 9.2 cm. The right renal pelvis is prominent/distended. No caliectasis. An 8 mm cyst is identified. Left Kidney: Length: 10.4 cm.  A 7.2 cm cyst is identified. Bladder: Bilateral ureteral jets identified. The bladder is grossly unremarkable. IMPRESSION: 1. There is a fluid-filled prominent right renal pelvis which is similar since 2016. No caliectasis. The kidneys are otherwise normal other than renal cysts as  above. Electronically Signed   By: Gerome Samavid  Williams III M.D   On: 05/06/2017 23:03    Scheduled Meds: . furosemide  40 mg Intravenous Q12H  . insulin aspart  0-9 Units Subcutaneous TID WC  . levothyroxine  88 mcg Oral QAC breakfast  . loratadine  10 mg Oral Daily  . pravastatin  40 mg Oral q1800  . sodium chloride flush  3 mL Intravenous Q12H   Continuous Infusions: . sodium chloride      Time spent: 25 minutes   Murl Zogg Magick-Tiari Andringa  [facility] Pager [phone]. If 7PM-7AM, please contact night-coverage at [email or web address], password [email or web address] 05/08/2017, 8:42 AM  LOS: 2 days

## 2017-05-09 LAB — BASIC METABOLIC PANEL WITH GFR
Anion gap: 10 (ref 5–15)
BUN: 111 mg/dL — ABNORMAL HIGH (ref 6–20)
CO2: 27 mmol/L (ref 22–32)
Calcium: 8.3 mg/dL — ABNORMAL LOW (ref 8.9–10.3)
Chloride: 102 mmol/L (ref 101–111)
Creatinine, Ser: 2.37 mg/dL — ABNORMAL HIGH (ref 0.44–1.00)
GFR calc Af Amer: 21 mL/min — ABNORMAL LOW (ref >60)
GFR calc non Af Amer: 18 mL/min — ABNORMAL LOW (ref >60)
Glucose, Bld: 96 mg/dL (ref 65–99)
Potassium: 4.5 mmol/L (ref 3.5–5.1)
Sodium: 139 mmol/L (ref 135–145)

## 2017-05-09 LAB — CBC
HCT: 33.2 % — ABNORMAL LOW (ref 36.0–46.0)
Hemoglobin: 10.7 g/dL — ABNORMAL LOW (ref 12.0–15.0)
MCH: 29.8 pg (ref 26.0–34.0)
MCHC: 32.2 g/dL (ref 30.0–36.0)
MCV: 92.5 fL (ref 78.0–100.0)
Platelets: 236 K/uL (ref 150–400)
RBC: 3.59 MIL/uL — ABNORMAL LOW (ref 3.87–5.11)
RDW: 14.2 % (ref 11.5–15.5)
WBC: 6.3 K/uL (ref 4.0–10.5)

## 2017-05-09 LAB — GLUCOSE, CAPILLARY
GLUCOSE-CAPILLARY: 97 mg/dL (ref 65–99)
GLUCOSE-CAPILLARY: 170 mg/dL — AB (ref 65–99)
GLUCOSE-CAPILLARY: 99 mg/dL (ref 65–99)
GLUCOSE-CAPILLARY: 153 mg/dL — AB (ref 65–99)
Glucose-Capillary: 93 mg/dL (ref 65–99)

## 2017-05-09 MED ORDER — FUROSEMIDE 40 MG PO TABS
40.0000 mg | ORAL_TABLET | Freq: Two times a day (BID) | ORAL | Status: DC
  Administered 2017-05-09 – 2017-05-10 (×3): 40 mg via ORAL
  Filled 2017-05-09 (×3): qty 1

## 2017-05-09 NOTE — Evaluation (Signed)
Physical Therapy Evaluation Patient Details Name: Michelle Morton MRN: [id number] DOB: 1931/09/07 Today's Date: 05/09/2017   History of Present Illness  83 y.o. female with medical history significant of HTN, HLD, hypothyroidism, DM type II, diastolic dysfunction last EF noted to be 55-60%,  07/2016, and CKD stage IV; who presents with complaints of large who presents with complaints of progressive leg swelling and weight gain  Clinical Impression  Pt admitted with above diagnosis. Pt currently with functional limitations due to the deficits listed below (see PT Problem List).  Pt will benefit from skilled PT to increase their independence and safety with mobility to allow discharge to the venue listed below.   Pt amb 100' with min assist and RW; will benefit from HHPT; will fokllow in acute setting     Follow Up Recommendations Home health PT;Supervision for mobility/OOB    Equipment Recommendations  None recommended by PT    Recommendations for Other Services       Precautions / Restrictions Precautions Precautions: Fall Restrictions Weight Bearing Restrictions: No      Mobility  Bed Mobility Overal bed mobility: Needs Assistance Bed Mobility: Supine to Sit     Supine to sit: Min guard;Supervision     General bed mobility comments: effortful, incr time needed; pt able to bring LEs off EOB when given time, assists RLE with UE  Transfers Overall transfer level: Needs assistance Equipment used: Rolling walker (2 wheeled) Transfers: Sit to/from Stand Sit to Stand: From elevated surface;Min assist         General transfer comment: posterior lean, cues for hand placement and assist to wt shift anteriorly and transition to RW  Ambulation/Gait Ambulation/Gait assistance: Min assist;Min guard Ambulation Distance (Feet): 100 Feet Assistive device: Rolling walker (2 wheeled) Gait Pattern/deviations: Step-through pattern;Decreased stride length;Trunk flexed     General  Gait Details: cues for trunk extension and upward gaze; min/guard to min for balance initially, stability improved with time and distance; HR max 109  Stairs            Wheelchair Mobility    Modified Rankin (Stroke Patients Only)       Balance Overall balance assessment: Needs assistance Sitting-balance support: Feet supported;No upper extremity supported Sitting balance-Leahy Scale: Good       Standing balance-Leahy Scale: Poor Standing balance comment: reliant on UEs and support from PT initial standing balance                             Pertinent Vitals/Pain Pain Assessment: No/denies pain    Home Living Family/patient expects to be discharged to:: Private residence Living Arrangements: Children(lives with dtr--recently dx with CA) Available Help at Discharge: Family Type of Home: House Home Access: Level entry     Home Layout: Two level Home Equipment: Environmental consultantWalker - 4 wheels;Walker - 2 wheels;Bedside commode Additional Comments: keeps one walker upstairs and one downstairs    Prior Function Level of Independence: Independent with assistive device(s)         Comments: amb iwth walker in home; dtr helps with meals, household tasks     Hand Dominance        Extremity/Trunk Assessment   Upper Extremity Assessment Upper Extremity Assessment: Defer to OT evaluation    Lower Extremity Assessment Lower Extremity Assessment: Generalized weakness       Communication      Cognition Arousal/Alertness: Awake/alert Behavior During Therapy: WFL for tasks assessed/performed Overall Cognitive Status: Within  Functional Limits for tasks assessed                                        General Comments      Exercises     Assessment/Plan    PT Assessment Patient needs continued PT services  PT Problem List Decreased strength;Decreased activity tolerance;Decreased balance;Decreased mobility       PT Treatment Interventions  DME instruction;Gait training;Therapeutic activities;Therapeutic exercise;Patient/family education;Functional mobility training    PT Goals (Current goals can be found in the Care Plan section)  Acute Rehab PT Goals Patient Stated Goal: home soon, get stronger PT Goal Formulation: With patient Time For Goal Achievement: 05/16/17 Potential to Achieve Goals: Good    Frequency Min 3X/week   Barriers to discharge        Co-evaluation               AM-PAC PT "6 Clicks" Daily Activity  Outcome Measure Difficulty turning over in bed (including adjusting bedclothes, sheets and blankets)?: A Little Difficulty moving from lying on back to sitting on the side of the bed? : A Little Difficulty sitting down on and standing up from a chair with arms (e.g., wheelchair, bedside commode, etc,.)?: Unable Help needed moving to and from a bed to chair (including a wheelchair)?: A Little Help needed walking in hospital room?: A Little Help needed climbing 3-5 steps with a railing? : A Lot 6 Click Score: 15    End of Session Equipment Utilized During Treatment: Gait belt Activity Tolerance: Patient tolerated treatment well Patient left: in chair;with call bell/phone within reach;with chair alarm set Nurse Communication: Mobility status PT Visit Diagnosis: Unsteadiness on feet (R26.81);Muscle weakness (generalized) (M62.81)    Time: [id number] PT Time Calculation (min) (ACUTE ONLY): 18 min   Charges:   PT Evaluation $PT Eval Low Complexity: 1 Low     PT G CodesDrucilla Chalet:        Mahagony Grieb, PT Pager: [phone] 05/09/2017   [facility] CenterWILLIAMS,Kaymon Denomme 05/09/2017, 11:57 AM

## 2017-05-09 NOTE — Progress Notes (Signed)
[facility]  PROGRESS NOTE  Michelle KnudsenMamie W Morton ZOX:[id number] DOB: [birth year] DOA: 05/06/2017 PCP: [name], [name], MD   Brief summary   83 y.o. female with medical history significant of HTN, HLD, hypothyroidism, DM type II, diastolic dysfunction last EF noted to be 55-60% with grade 1 dFx on 07/2016, and CKD stage IV; who presents with complaints of large who presents with complaints of progressive leg swelling and weight gain.  Per review of records at her PCP office today she weighed 183, and previously only weight 168 pounds on office visit from 03/09/2017.  Patient notes that she still able to urinate.   ED Course: Upon admission emergency department patient patient was noted to have relatively normal vital signs except for blood pressures noted to be 146/72-154/70.  Labs revealed WBC 7, Hbg 10.8, BUN 130, Cr 2.83, and glucose 148.  Dr. Lacy Duver[place] of nephrology was consulted, and will see the patient in a.m.  Recommended checking albumin level and discouraged any additional diuretic use.  [facility] called to admit.  Assessment/Plan:  Acute on chronic Diastolic CHF/fluid overload. Worsening with renal failure. Patient appears to be fluid overloaded on physical exam. Chest x-ray showing cardiomegaly that appears stable with pulmonary vascular congestion and chronic interstitial changes.  Last EF noted to be 55-60% with grade 1 diastolic dysfunction in 07/2016.   - has been on lasix, Cr is up this AM so will change lasix to PO - swelling overall better and pt says she feels better - this is the weight trend in the past 72 hours  Filed Weights   05/07/17 0500 05/08/17 0609 05/09/17 0622  Weight: 79.8 kg (175 lb 14.8 oz) 78.3 kg (172 lb 9.9 oz) 77.6 kg (171 lb 1.2 oz)    Probably acute renal failure on chronic kidney disease IV: Patient's baseline creatinine had previously been noted to be around 2 - Cr up this AM, change lasix to PO - BMP in AM  Acute bronchitis: Afebrile. CXR: no clear  infiltrates. Cont Trial of Levalbuterol of breathing treatment.  - resp status stable   Anemia of chronic disease: Patient with a hemoglobin of 10.8 on admission normally patient hemoglobin appears to be 11 g/dL or higher.  With the elevated BUN questioned the possibility of GI bleed.  - no evidence of active bleeding - CBC in AM  Essential hypertension: SBP in 140's place on hydralazine for now and monitor clinical response   Diabetes mellitus type 2: hemoglobin A1c on file noted to be 6.1 on 12/10/2016. Monitor on ISS.    Hypothyroidism: Last TSH noted to be 3.32 on 01/26/2017. Continue synthroid   Hyperlipidemia cont statin   Code Status: full Family Communication: pt updated at bedside  Disposition Plan: home in am if Cr better   Consultants:  Nephrology   Procedures:  None   Antibiotics:  None  HPI/Subjective: Pt reports feeling better.   Objective: Vitals:   05/08/17 2108 05/09/17 0622  BP: (!) 116/48 116/61  Pulse: 81 69  Resp: 18 16  Temp: 98 F (36.7 C) 97.9 F (36.6 C)  SpO2: 99% 100%    Intake/Output Summary (Last 24 hours) at 05/09/2017 0920 Last data filed at 05/09/2017 [id number] Gross per 24 hour  Intake -  Output 1500 ml  Net -1500 ml   Filed Weights   05/07/17 0500 05/08/17 0609 05/09/17 0622  Weight: 79.8 kg (175 lb 14.8 oz) 78.3 kg (172 lb 9.9 oz) 77.6 kg (171 lb 1.2 oz)    Physical  Exam  Constitutional: Appears calm, NAD CVS: RRR, S1/S2 +, no murmurs, no gallops, no carotid bruit.  Pulmonary: Effort and breath sounds normal, no stridor, rhonchi, wheezes, rales.  Abdominal: Soft. BS +,  no distension, tenderness, rebound or guarding.  Musculoskeletal: Normal range of motion. +1 bilateral LE edema   Data Reviewed: Basic Metabolic Panel: Recent Labs  Lab 05/06/17 1754 05/07/17 0502 05/08/17 0435 05/09/17 0459  NA 137 138 140 139  K 4.5 4.6 4.9 4.5  CL 102 103 103 102  CO2 23 25 26 27   GLUCOSE 148* 150* 97 96  BUN 130* 115*  108* 111*  CREATININE 2.83* 2.44* 2.15* 2.37*  CALCIUM 9.0 8.5* 8.8* 8.3*  PHOS  --  5.4*  --   --    Liver Function Tests: Recent Labs  Lab 05/06/17 2328 05/07/17 0502  AST 22  --   ALT 19  --   ALKPHOS 42  --   BILITOT 0.7  --   PROT 7.2  --   ALBUMIN 3.6 3.1*   CBC: Recent Labs  Lab 05/06/17 1754 05/07/17 0502 05/08/17 0435 05/09/17 0459  WBC 7.0 5.8 5.9 6.3  NEUTROABS  --  2.4  --   --   HGB 10.8* 9.9* 10.8* 10.7*  HCT 33.0* 30.2* 33.6* 33.2*  MCV 92.2 92.6 93.1 92.5  PLT 252 216 251 236   Cardiac Enzymes: Recent Labs  Lab 05/06/17 2328 05/07/17 0502 05/07/17 1121  TROPONINI <0.03 <0.03 <0.03   BNP (last 3 results) Recent Labs    05/06/17 2328  BNP 121.3*     CBG: Recent Labs  Lab 05/08/17 0852 05/08/17 1226 05/08/17 1639 05/08/17 2108 05/09/17 0801  GLUCAP 206* 77 114* 209* 93    Studies: No results found.  Scheduled Meds: . furosemide  40 mg Intravenous Q12H  . insulin aspart  0-9 Units Subcutaneous TID WC  . levothyroxine  88 mcg Oral QAC breakfast  . loratadine  10 mg Oral Daily  . pravastatin  40 mg Oral q1800  . sodium chloride flush  3 mL Intravenous Q12H  . traZODone  50 mg Oral Once   Continuous Infusions: . sodium chloride      Time spent: 25 minutes   Iskra Magick-Myers  [facility] Pager [phone]. If 7PM-7AM, please contact night-coverage at [email or web address], password [email or web address] 05/09/2017, 9:20 AM  LOS: 3 days

## 2017-05-10 LAB — BASIC METABOLIC PANEL
Anion gap: 11 (ref 5–15)
BUN: 113 mg/dL — AB (ref 6–20)
CO2: 28 mmol/L (ref 22–32)
CREATININE: 2.49 mg/dL — AB (ref 0.44–1.00)
Calcium: 8.5 mg/dL — ABNORMAL LOW (ref 8.9–10.3)
Chloride: 98 mmol/L — ABNORMAL LOW (ref 101–111)
GFR, EST AFRICAN AMERICAN: 19 mL/min — AB (ref >60)
GFR, EST NON AFRICAN AMERICAN: 17 mL/min — AB (ref >60)
Glucose, Bld: 94 mg/dL (ref 65–99)
POTASSIUM: 4.2 mmol/L (ref 3.5–5.1)
SODIUM: 137 mmol/L (ref 135–145)

## 2017-05-10 LAB — GLUCOSE, CAPILLARY
GLUCOSE-CAPILLARY: 101 mg/dL — AB (ref 65–99)
GLUCOSE-CAPILLARY: 182 mg/dL — AB (ref 65–99)
GLUCOSE-CAPILLARY: 102 mg/dL — AB (ref 65–99)
Glucose-Capillary: 131 mg/dL — ABNORMAL HIGH (ref 65–99)

## 2017-05-10 LAB — CBC
HCT: 37.2 % (ref 36.0–46.0)
HEMOGLOBIN: 12 g/dL (ref 12.0–15.0)
MCH: 29.9 pg (ref 26.0–34.0)
MCHC: 32.3 g/dL (ref 30.0–36.0)
MCV: 92.5 fL (ref 78.0–100.0)
PLATELETS: 240 10*3/uL (ref 150–400)
RBC: 4.02 MIL/uL (ref 3.87–5.11)
RDW: 13.9 % (ref 11.5–15.5)
WBC: 6.6 10*3/uL (ref 4.0–10.5)

## 2017-05-10 MED ORDER — DOCUSATE SODIUM 100 MG PO CAPS
200.0000 mg | ORAL_CAPSULE | Freq: Once | ORAL | Status: AC
  Administered 2017-05-10: 200 mg via ORAL
  Filled 2017-05-10: qty 2

## 2017-05-10 NOTE — Clinical Social Work Note (Signed)
Clinical Social Work Assessment  Patient Details  Name: Michelle KnudsenMamie W Coghlan MRN: [id number] Date of Birth: May 15, 1932  Date of referral:  05/10/17               Reason for consult:  Facility Placement                Permission sought to share information with:  Oceanographeracility Contact Representative Permission granted to share information::  Yes, Verbal Permission Granted  Name::        Agency::     Relationship::     Contact Information:     Housing/Transportation Living arrangements for the past 2 months:  Single Family Home Source of Information:  Patient Patient Interpreter Needed:  None Criminal Activity/Legal Involvement Pertinent to Current Situation/Hospitalization:  No - Comment as needed Significant Relationships:  Adult Children Lives with:  Adult Children Do you feel safe going back to the place where you live?  (PT recommending SNF) Need for family participation in patient care:  No (Coment)  Care giving concerns:  Patient from home with her daughter. Patient independent at baseline with most ADLs uses a walker to assist with ambulation. PT recommending SNF.   Social Worker assessment / plan:  CSW spoke with patient at bedside reagrding PT recommendation for SNF. Patient agreeable, CSW explained SNF placement process. Patient reported [facility].   CSW will complete FL2 and follow up with bed offers.  Employment status:  Retired Database administratornsurance information:  Managed Medicare PT Recommendations:  Skilled Nursing Facility Information / Referral to community resources:  Skilled Nursing Facility  Patient/Family's Response to care:  [facility]. Patient appreciative of CSW assistance with discharge planning.  Patient/Family's Understanding of and Emotional Response to Diagnosis, Current Treatment, and Prognosis:  Patient verbalized understanding of current diagnosis and verbalized plan to dc to SNF for ST rehab.  Patient reported that she is hopeful to regain her strength and be as independent as possible. Patient informed CSW that her daughter was recently diagnosed with cancer and would be starting her treatment in January. CSW provided emotional support and positively affirmed patient's motivation to regain her strength independence.    Emotional Assessment Appearance:  Appears stated age Attitude/Demeanor/Rapport:  Other(Cooperative) Affect (typically observed):  Pleasant Orientation:  Oriented to Self, Oriented to Place, Oriented to  Time, Oriented to Situation Alcohol / Substance use:  Not Applicable Psych involvement (Current and /or in the community):  No (Comment)  Discharge Needs  Concerns to be addressed:  Care Coordination Readmission within the last 30 days:  No Current discharge risk:  Physical Impairment Barriers to Discharge:  Continued Medical Work up   USG CorporationKimberly L Elaine Middleton, LCSW 05/10/2017, 3:01 PM

## 2017-05-10 NOTE — NC FL2 (Signed)
[place] MEDICAID FL2 LEVEL OF CARE SCREENING TOOL     IDENTIFICATION  Patient Name: Michelle Morton Birthdate: 1931-12-19 Sex: female Admission Date (Current Location): 05/06/2017  [facility] [place]   Facility and Address:  [facility],  [address], [place] [id number]      Provider Number: [id number]  Attending Physician Name and Address:  Dorothea OgleMyers, Iskra M, MD  Relative Name and Phone Number:       Current Level of Care: Hospital Recommended Level of Care: Skilled Nursing Facility Prior Approval Number:    Date Approved/Denied:   PASRR Number:    Discharge Plan: SNF    Current Diagnoses: Patient Active Problem List   Diagnosis Date Noted  . Acute diastolic CHF (congestive heart failure) (HCC) 05/07/2017  . Bilateral lower extremity edema 05/06/2017  . Osteoporosis 01/26/2017  . Degenerative disc disease, lumbar 12/29/2016  . Neck pain on left side 12/12/2016  . Leg cramps 06/05/2016  . General weakness 03/04/2016  . Hypoglycemia 12/17/2015  . RLS (restless legs syndrome) 12/17/2015  . Weight loss 12/17/2015  . Back pain 10/16/2015  . Right-sided chest pain 03/27/2015  . Diarrhea 03/27/2015  . Hoarseness 03/27/2015  . Loss of weight 03/27/2015  . Depression 01/07/2015  . Chronic kidney disease (CKD), stage IV (severe) (HCC) 01/07/2015  . AKI (acute kidney injury) (HCC) 10/09/2014  . Uterine prolaps 09/01/2013  . Peripheral edema 02/14/2013  . Allergic rhinitis 02/14/2013  . Obesity, Class II, BMI 35-39.9 05/12/2011  . Preventative health care 05/12/2011  . TINNITUS 01/09/2009  . Essential hypertension 05/05/2007  . Hypothyroidism 02/28/2007  . Diabetes (HCC) 02/28/2007  . Hyperlipidemia 02/28/2007  . Osteoarthrosis, unspecified whether generalized or localized, involving lower leg 02/28/2007    Orientation RESPIRATION BLADDER Height & Weight     Self, Situation, Time, Place  Normal Continent Weight: 167 lb 5.3 oz  (75.9 kg) Height:  5\' 4"  (162.6 cm)  BEHAVIORAL SYMPTOMS/MOOD NEUROLOGICAL BOWEL NUTRITION STATUS        Diet(see dc summary)  AMBULATORY STATUS COMMUNICATION OF NEEDS Skin   Extensive Assist Verbally Normal                       Personal Care Assistance Level of Assistance  Bathing, Feeding, Dressing Bathing Assistance: Maximum assistance Feeding assistance: Independent Dressing Assistance: Maximum assistance     Functional Limitations Info  Sight, Hearing, Speech Sight Info: Adequate Hearing Info: Adequate Speech Info: Adequate    SPECIAL CARE FACTORS FREQUENCY  PT (By licensed PT), OT (By licensed OT)     PT Frequency: 5x OT Frequency: 5x            Contractures Contractures Info: Not present    Additional Factors Info  Code Status, Allergies Code Status Info: Full Code Allergies Info: Penicillins            Current Medications (05/10/2017):  This is the current hospital active medication list Current Facility-Administered Medications  Medication Dose Route Frequency Provider Last Rate Last Dose  . 0.9 %  sodium chloride infusion  250 mL Intravenous PRN Madelyn FlavorsSmith, Rondell A, MD      . acetaminophen (TYLENOL) tablet 650 mg  650 mg Oral Q4H PRN Madelyn FlavorsSmith, Rondell A, MD   650 mg at 05/10/17 1220  . gabapentin (NEURONTIN) capsule 200 mg  200 mg Oral QHS PRN Madelyn FlavorsSmith, Rondell A, MD   200 mg at 05/08/17 2241  . insulin aspart (novoLOG) injection 0-9 Units  0-9 Units Subcutaneous TID WC Madelyn FlavorsSmith, Rondell A, MD   2 Units at 05/10/17 1213  . levothyroxine (SYNTHROID, LEVOTHROID) tablet 88 mcg  88 mcg Oral QAC breakfast Madelyn FlavorsSmith, Rondell A, MD   88 mcg at 05/10/17 0819  . loratadine (CLARITIN) tablet 10 mg  10 mg Oral Daily Madelyn FlavorsSmith, Rondell A, MD   10 mg at 05/10/17 0819  . ondansetron (ZOFRAN) injection 4 mg  4 mg Intravenous Q6H PRN Smith, Rondell A, MD      . pravastatin (PRAVACHOL) tablet 40 mg  40 mg Oral q1800 Madelyn FlavorsSmith, Rondell A, MD   40 mg at 05/09/17 1714  . sodium chloride  (OCEAN) 0.65 % nasal spray 1 spray  1 spray Nasal BID PRN Smith, Rondell A, MD      . sodium chloride flush (NS) 0.9 % injection 3 mL  3 mL Intravenous Q12H Smith, Rondell A, MD   3 mL at 05/10/17 0820  . sodium chloride flush (NS) 0.9 % injection 3 mL  3 mL Intravenous PRN Smith, Rondell A, MD      . traZODone (DESYREL) tablet 50 mg  50 mg Oral Once Charissa BashBlount, Xenia T, NP         Discharge Medications: Please see discharge summary for a list of discharge medications.  Relevant Imaging Results:  Relevant Lab Results:   Additional Information SSN [id number]  Antionette PolesKimberly L Yisel Megill, LCSW

## 2017-05-10 NOTE — Care Management Important Message (Signed)
Important Message  Patient Details  Name: Michelle Morton MRN: [id number] Date of Birth: Apr 15, 1932   Medicare Important Message Given:  Yes    [name], [name] 05/10/2017, 2:23 PMImportant Message  Patient Details  Name: Michelle Morton MRN: [id number] Date of Birth: Apr 15, 1932   Medicare Important Message Given:  Yes    [name], [name] 05/10/2017, 2:22 PM

## 2017-05-10 NOTE — Progress Notes (Signed)
Physical Therapy Treatment Patient Details Name: Michelle KnudsenMamie W Morton MRN: [id number] DOB: 12-Jun-1931 Today's Date: 05/10/2017    History of Present Illness 83 y.o. female with medical history significant of HTN, HLD, hypothyroidism, DM type II, diastolic dysfunction last EF noted to be 55-60%,  07/2016, and CKD stage IV; who presents with complaints of large who presents with complaints of progressive leg swelling and weight gain    PT Comments    Asked by RN to reassess pt on today. Pt c/o pain in both feet and difficulty mobilizing. On today, pt required Mod assist to stand and Min assist to walk ~15 feet with a RW. Pt is requiring increased assistance on today compared to yesterday's session. She is weak and fatigues easily with activity. Pt is currently at risk for falls when mobilizing. Discussed d/c plan-pt is agreeable to short term rehab stay. She does not feel she can safely manage at home in her current condition. D/c plan updated. Will continue to follow and progress activity as tolerated.     Follow Up Recommendations  SNF     Equipment Recommendations  None recommended by PT    Recommendations for Other Services       Precautions / Restrictions Precautions Precautions: Fall Restrictions Weight Bearing Restrictions: No    Mobility  Bed Mobility               General bed mobility comments: pt sitting EOB  Transfers Overall transfer level: Needs assistance Equipment used: Rolling walker (2 wheeled) Transfers: Sit to/from Stand Sit to Stand: Mod assist         General transfer comment: Posterior lean. Assist to rise, stabilize, control descent. Increased time.   Ambulation/Gait Ambulation/Gait assistance: Min assist Ambulation Distance (Feet): 15 Feet Assistive device: Rolling walker (2 wheeled) Gait Pattern/deviations: Step-to pattern;Trunk flexed     General Gait Details: Pt is unsteady and at risk for falls. Very slow gait speed. Assist to stabilize and  maneuver safely with RW. Cues for posture and safe distance from RW. Pt fatigues very easily. She required a seated rest after just 15 feet.    Stairs            Wheelchair Mobility    Modified Rankin (Stroke Patients Only)       Balance Overall balance assessment: Needs assistance         Standing balance support: Bilateral upper extremity supported Standing balance-Leahy Scale: Poor                              Cognition Arousal/Alertness: Awake/alert Behavior During Therapy: WFL for tasks assessed/performed Overall Cognitive Status: Within Functional Limits for tasks assessed                                        Exercises      General Comments        Pertinent Vitals/Pain Pain Assessment: Faces Faces Pain Scale: Hurts little more Pain Location: bil feet Pain Descriptors / Indicators: Sore;Discomfort Pain Intervention(s): Limited activity within patient's tolerance;Repositioned    Home Living                      Prior Function            PT Goals (current goals can now be found in the care plan section) Progress  towards PT goals: Not progressing toward goals - comment(Increased difficulty mobilizing on today. D/c plan updated to SNF instead of HH)    Frequency    Min 3X/week      PT Plan Discharge plan needs to be updated    Co-evaluation              AM-PAC PT "6 Clicks" Daily Activity  Outcome Measure  Difficulty turning over in bed (including adjusting bedclothes, sheets and blankets)?: A Little Difficulty moving from lying on back to sitting on the side of the bed? : A Little Difficulty sitting down on and standing up from a chair with arms (e.g., wheelchair, bedside commode, etc,.)?: Unable Help needed moving to and from a bed to chair (including a wheelchair)?: A Lot Help needed walking in hospital room?: A Lot Help needed climbing 3-5 steps with a railing? : A Lot 6 Click Score: 13     End of Session Equipment Utilized During Treatment: Gait belt Activity Tolerance: Patient limited by fatigue Patient left: in bed;with call bell/phone within reach   PT Visit Diagnosis: Muscle weakness (generalized) (M62.81);Difficulty in walking, not elsewhere classified (R26.2);Pain Pain - Right/Left: (R and L) Pain - part of body: Ankle and joints of foot     Time: 1012-1022 PT Time Calculation (min) (ACUTE ONLY): 10 min  Charges:  $Gait Training: 8-22 mins                    G Codes:          Rebeca AlertJannie Johari Bennetts, MPT Pager: [phone]

## 2017-05-10 NOTE — Progress Notes (Signed)
[facility] PROGRESS NOTE  Michelle Morton ZOX:[id number] DOB: 10-29-1931 DOA: 05/06/2017 PCP: [name], [name], MD    Brief summary   83 y.o. female with medical history significant of HTN, HLD, hypothyroidism, DM type II, diastolic dysfunction last EF noted to be 55-60% with grade 1 dFx on 07/2016, and CKD stage IV; who presents with complaints of large who presents with complaints of progressive leg swelling and weight gain.  Per review of records at her PCP office today she weighed 183, and previously only weight 168 pounds on office visit from 03/09/2017.  Patient notes that she still able to urinate.   ED Course: Upon admission emergency department patient patient was noted to have relatively normal vital signs except for blood pressures noted to be 146/72-154/70.  Labs revealed WBC 7, Hbg 10.8, BUN 130, Cr 2.83, and glucose 148.  Dr. [place]acy Duver[place] of nephrology was consulted, and will see the patient in a.m.  Recommended checking albumin level and discouraged any additional diuretic use.  [facility] called to admit.  Assessment/Plan:  Acute on chronic Diastolic CHF/fluid overload. Worsening with renal failure. Patient appears to be fluid overloaded on physical exam. Chest x-ray showing cardiomegaly that appears stable with pulmonary vascular congestion and chronic interstitial changes.  Last EF noted to be 55-60% with grade 1 diastolic dysfunction in 07/2016.   - has been on lasix, changed to PO but Cr still up, will hold tonight's lasix and possibly resume in AM - this is the weight trend in the past 72 hours  Filed Weights   05/08/17 0609 05/09/17 0622 05/10/17 0519  Weight: 78.3 kg (172 lb 9.9 oz) 77.6 kg (171 lb 1.2 oz) 75.9 kg (167 lb 5.3 oz)  - monitor weights, I/O  Probably acute renal failure on chronic kidney disease IV: Patient's baseline creatinine had previously been noted to be around 2 - Cr still up - repeat BMP in AM - hold lasix   Acute bronchitis: Afebrile. CXR: no clear  infiltrates. Cont Trial of Levalbuterol of breathing treatment.  - resp status stable   Anemia of chronic disease: Patient with a hemoglobin of 10.8 on admission normally patient hemoglobin appears to be 11 g/dL or higher.  With the elevated BUN questioned the possibility of GI bleed.  - no signs of active bleeding - CBC in AM  Essential hypertension: SBP in 140's place on hydralazine for now and monitor clinical response   Diabetes mellitus type 2: hemoglobin A1c on file noted to be 6.1 on 12/10/2016. Monitor on ISS.    Hypothyroidism: Last TSH noted to be 3.32 on 01/26/2017. Continue synthroid   Hyperlipidemia cont statin   Code Status: full Family Communication: pt updated at bedside  Disposition Plan: SNF when bed available   Consultants:  Nephrology   Procedures:  None   Antibiotics:  None  HPI/Subjective: Pt reports feeling better.   Objective: Vitals:   05/09/17 2118 05/10/17 0519  BP: (!) 124/58 126/64  Pulse: 80 77  Resp: 16 16  Temp: 97.7 F (36.5 C) 98 F (36.7 C)  SpO2: 100% 99%    Intake/Output Summary (Last 24 hours) at 05/10/2017 1433 Last data filed at 05/10/2017 0900 Gross per 24 hour  Intake 240 ml  Output 3200 ml  Net -2960 ml   Filed Weights   05/08/17 0609 05/09/17 0622 05/10/17 0519  Weight: 78.3 kg (172 lb 9.9 oz) 77.6 kg (171 lb 1.2 oz) 75.9 kg (167 lb 5.3 oz)   Physical Exam  Constitutional: Appears  calm, NAD CVS: RRR, S1/S2 +, no murmurs, no gallops, no carotid bruit.  Pulmonary: Effort and breath sounds normal, no stridor, rhonchi, wheezes, rales.  Abdominal: Soft. BS +,  no distension, tenderness, rebound or guarding.  Musculoskeletal: Normal range of motion. No edema and no tenderness.   Data Reviewed: Basic Metabolic Panel: Recent Labs  Lab 05/06/17 1754 05/07/17 0502 05/08/17 0435 05/09/17 0459 05/10/17 0440  NA 137 138 140 139 137  K 4.5 4.6 4.9 4.5 4.2  CL 102 103 103 102 98*  CO2 23 25 26 27 28   GLUCOSE  148* 150* 97 96 94  BUN 130* 115* 108* 111* 113*  CREATININE 2.83* 2.44* 2.15* 2.37* 2.49*  CALCIUM 9.0 8.5* 8.8* 8.3* 8.5*  PHOS  --  5.4*  --   --   --    Liver Function Tests: Recent Labs  Lab 05/06/17 2328 05/07/17 0502  AST 22  --   ALT 19  --   ALKPHOS 42  --   BILITOT 0.7  --   PROT 7.2  --   ALBUMIN 3.6 3.1*   CBC: Recent Labs  Lab 05/06/17 1754 05/07/17 0502 05/08/17 0435 05/09/17 0459 05/10/17 0440  WBC 7.0 5.8 5.9 6.3 6.6  NEUTROABS  --  2.4  --   --   --   HGB 10.8* 9.9* 10.8* 10.7* 12.0  HCT 33.0* 30.2* 33.6* 33.2* 37.2  MCV 92.2 92.6 93.1 92.5 92.5  PLT 252 216 251 236 240   Cardiac Enzymes: Recent Labs  Lab 05/06/17 2328 05/07/17 0502 05/07/17 1121  TROPONINI <0.03 <0.03 <0.03   BNP (last 3 results) Recent Labs    05/06/17 2328  BNP 121.3*     CBG: Recent Labs  Lab 05/09/17 1149 05/09/17 1619 05/09/17 2128 05/10/17 0757 05/10/17 1145  GLUCAP 170* 99 153* 101* 182*    Studies: No results found.  Scheduled Meds: . furosemide  40 mg Oral BID  . insulin aspart  0-9 Units Subcutaneous TID WC  . levothyroxine  88 mcg Oral QAC breakfast  . loratadine  10 mg Oral Daily  . pravastatin  40 mg Oral q1800  . sodium chloride flush  3 mL Intravenous Q12H  . traZODone  50 mg Oral Once   Continuous Infusions: . sodium chloride      Time spent: 25 minutes   Iskra Magick-Myers  [facility] Pager [phone]. If 7PM-7AM, please contact night-coverage at [email or web address], password [email or web address] 05/10/2017, 2:33 PM  LOS: 4 days

## 2017-05-11 LAB — GLUCOSE, CAPILLARY
GLUCOSE-CAPILLARY: 312 mg/dL — AB (ref 65–99)
Glucose-Capillary: 100 mg/dL — ABNORMAL HIGH (ref 65–99)

## 2017-05-11 LAB — BASIC METABOLIC PANEL
Anion gap: 12 (ref 5–15)
BUN: 115 mg/dL — AB (ref 6–20)
CALCIUM: 8.1 mg/dL — AB (ref 8.9–10.3)
CO2: 26 mmol/L (ref 22–32)
CREATININE: 2.57 mg/dL — AB (ref 0.44–1.00)
Chloride: 99 mmol/L — ABNORMAL LOW (ref 101–111)
GFR calc non Af Amer: 16 mL/min — ABNORMAL LOW (ref >60)
GFR, EST AFRICAN AMERICAN: 19 mL/min — AB (ref >60)
GLUCOSE: 93 mg/dL (ref 65–99)
Potassium: 4.5 mmol/L (ref 3.5–5.1)
Sodium: 137 mmol/L (ref 135–145)

## 2017-05-11 LAB — CBC
HEMATOCRIT: 34.8 % — AB (ref 36.0–46.0)
Hemoglobin: 11.1 g/dL — ABNORMAL LOW (ref 12.0–15.0)
MCH: 29.8 pg (ref 26.0–34.0)
MCHC: 31.9 g/dL (ref 30.0–36.0)
MCV: 93.3 fL (ref 78.0–100.0)
PLATELETS: 245 10*3/uL (ref 150–400)
RBC: 3.73 MIL/uL — ABNORMAL LOW (ref 3.87–5.11)
RDW: 14.1 % (ref 11.5–15.5)
WBC: 5.6 10*3/uL (ref 4.0–10.5)

## 2017-05-11 MED ORDER — FUROSEMIDE 40 MG PO TABS: 40.0000 mg | ORAL_TABLET | Freq: Two times a day (BID) | ORAL | Status: DC

## 2017-05-11 NOTE — Discharge Summary (Signed)
Physician Discharge Summary  Michelle Morton TMY:[id number] DOB: 10-Jul-1931 DOA: 05/06/2017  PCP: Biagio Borg, MD  Admit date: 05/06/2017 Discharge date: 05/11/2017  Recommendations for Outpatient Follow-up:  1. Pt will need to follow up with PCP in 1-2 weeks but please note, pt will need BMP sooner to follow up on Creatinine  2. Please obtain BMP in 2-3 days and resume lasix if clinically indicated and if renal function stable  3. Please also check CBC to evaluate Hg and Hct levels  Discharge Diagnoses:  Principal Problem:   Acute diastolic CHF (congestive heart failure) (HCC) Active Problems:   Hypothyroidism   Diabetes ([place])   Bilateral lower extremity edema   Discharge Condition: Stable  Diet recommendation: Heart healthy, renal diet   History of present illness:   Brief summary   83 y.o.femalewith medical history significant of HTN, HLD, hypothyroidism, DM type II, diastolic dysfunction last EF noted to be 55-60% with grade 1 dFx on 07/2016, and CKD stage IV; who presents with complaints of large who presents with complaints of progressive leg swelling and weight gain. Per review of records at her PCP office today she weighed 183, and previously only weight 168 pounds on office visit from 03/09/2017. Patient notes that she still able to urinate.   ED Course:Upon admission emergency department patient patient was noted to have relatively normal vital signs except for blood pressures noted to be 146/72-154/70. Labs revealed WBC 7, Hbg 10.8, BUN 130, Cr 2.83, and glucose 148. Dr. Clover Mealy of nephrology was consulted, and will see the patient in a.m. Recommended checking albumin level and discouraged any additional diuretic use. [facility] called to admit.  Assessment/Plan:  Acute on chronic Diastolic CHF/fluid overload. Worsening with renal failure. Patient appears to be fluid overloaded on physical exam. Chest x-ray showing cardiomegaly that appears stable with pulmonary  vascular congestion and chronic interstitial changes. Last EF noted to be 55-60% with grade 1 diastolic dysfunction in 05/4101.  - has been on lasix, changed to PO but Cr still up,lasix was held 12/11 - this is the weight trend in the past 72 hours  Filed Weights   05/09/17 0622 05/10/17 0519 05/11/17 0536  Weight: 77.6 kg (171 lb 1.2 oz) 75.9 kg (167 lb 5.3 oz) 76.3 kg (168 lb 3.4 oz)  - pt will need BMP checked in 2-3 days and if Cr improving, can restart lasix  Probably acute renal failure on chronic kidney disease IV: Patient's baseline creatinine had previously been noted to be around 2 - Cr still up - hold lasix on discharge and resume once Cr stable   Acute bronchitis: Afebrile. CXR: no clear infiltrates. Cont Trial of Levalbuterol of breathing treatment.  - resp status stable   Anemia of chronic disease: Patient with a hemoglobin of 10.8 on admission normally patient hemoglobin appears to be 11 g/dL or higher. With the elevated BUN questioned the possibility of GI bleed.  - no signs of active bleeding  Essential hypertension: SBP in 140's place on hydralazine for now and monitor clinical response   Diabetes mellitus type 2: hemoglobin A1c on file noted to be 6.1 on 12/10/2016.  Hypothyroidism: Last TSH noted to be 3.32 on 01/26/2017. Continue synthroid   Hyperlipidemia cont statin   Code Status: full Family Communication: pt updated at bedside  Disposition Plan: SNF when bed available   Consultants:  Nephrology   Procedures:  None   Antibiotics:  None    Procedures/Studies: Dg Chest 2 View  Result Date:  05/06/2017 CLINICAL DATA:  Progressive shortness of breath for 5 days. Leg swelling for 3 days. EXAM: CHEST  2 VIEW COMPARISON:  Chest radiograph February 25, 2016 FINDINGS: The cardiac silhouette is mildly enlarged. Tortuous, possibly ectatic aorta. Calcified aortic knob. Similar pulmonary vascular congestion without pleural effusion or focal  consolidation mild chronic interstitial changes. No pneumothorax. Soft tissue planes included osseous structures are nonsuspicious. Osteopenia. Surgical clip projects in the abdomen. IMPRESSION: Stable cardiomegaly and pulmonary vascular congestion. Mild chronic interstitial changes. Aortic Atherosclerosis (ICD10-I70.0). Electronically Signed   By: Elon Alas M.D.   On: 05/06/2017 21:08   US Renal  Result Date: 05/06/2017 CLINICAL DATA:  Renal failure. EXAM: RENAL / URINARY TRACT ULTRASOUND COMPLETE COMPARISON:  None. FINDINGS: Right Kidney: Length: 9.2 cm. The right renal pelvis is prominent/distended. No caliectasis. An 8 mm cyst is identified. Left Kidney: Length: 10.4 cm.  A 7.2 cm cyst is identified. Bladder: Bilateral ureteral jets identified. The bladder is grossly unremarkable. IMPRESSION: 1. There is a fluid-filled prominent right renal pelvis which is similar since 2016. No caliectasis. The kidneys are otherwise normal other than renal cysts as above. Electronically Signed   By: Dorise Bullion III M.D   On: 05/06/2017 23:03     Discharge Exam: Vitals:   05/10/17 2100 05/11/17 0536  BP: 118/62 (!) 103/51  Pulse: 79 78  Resp: 16 16  Temp: 98.1 F (36.7 C) 98.1 F (36.7 C)  SpO2: 100% 99%   Vitals:   05/10/17 0519 05/10/17 1500 05/10/17 2100 05/11/17 0536  BP: 126/64 (!) 118/58 118/62 (!) 103/51  Pulse: 77 78 79 78  Resp: _0 Temp: 98 F (36.7 C) 98.2 F (36.8 C) 98.1 F (36.7 C) 98.1 F (36.7 C)  TempSrc: Oral Oral Oral Oral  SpO2: 99% 100% 100% 99%  Weight: 75.9 kg (167 lb 5.3 oz)   76.3 kg (168 lb 3.4 oz)  Height:        General: Pt is alert, follows commands appropriately, not in acute distress Cardiovascular: Regular rate and rhythm, S1/S2 +, no rubs, no gallops Respiratory: Clear to auscultation bilaterally, no wheezing, no crackles, no rhonchi Abdominal: Soft, non tender, non distended, bowel sounds +, no guarding Extremities: trace bilateral LE  pitting edema, no cyanosis, pulses palpable bilaterally DP and PT Neuro: Grossly nonfocal  Discharge Instructions  Discharge Instructions    Diet - low sodium heart healthy   Complete by:  As directed    Increase activity slowly   Complete by:  As directed      Allergies as of 05/11/2017      Reactions   Penicillins Rash   Allergic to IV penicillin but tolerates the oral form Has patient had a PCN reaction causing immediate rash, facial/tongue/throat swelling, SOB or lightheadedness with hypotension: no Has patient had a PCN reaction causing severe rash involving mucus membranes or skin necrosis: no Has patient had a PCN reaction that required hospitalization already in the hospital for other procedure  Has patient had a PCN reaction occurring within the last 10 years: no If all of the above answers are "NO", then ma      Medication List    STOP taking these medications   oxyCODONE-acetaminophen 5-325 MG tablet Commonly known as:  PERCOCET/ROXICET     TAKE these medications   cetirizine 10 MG tablet Commonly known as:  ZYRTEC Take 10 mg by mouth daily.   diclofenac sodium 1 % Gel Commonly known as:  VOLTAREN APPLY  4 GRAMS TOPICALLY FOUR TIMES DAILY as needed What changed:  additional instructions   furosemide 40 MG tablet Commonly known as:  LASIX Take 1 tablet (40 mg total) by mouth 2 (two) times daily. Start taking on:  05/13/2017 What changed:  These instructions start on 05/13/2017. If you are unsure what to do until then, ask your doctor or other care provider.   gabapentin 100 MG capsule Commonly known as:  NEURONTIN Take 2 capsules (200 mg total) by mouth at bedtime. What changed:    when to take this  reasons to take this   JANUVIA 50 MG tablet Generic drug:  sitaGLIPtin TAKE ONE TABLET BY MOUTH ONCE DAILY   Lancets Misc Use as directed once per day   levothyroxine 88 MCG tablet Commonly known as:  SYNTHROID, LEVOTHROID TAKE ONE TABLET BY MOUTH  ONCE DAILY What changed:    how much to take  how to take this  when to take this   lovastatin 40 MG tablet Commonly known as:  MEVACOR Take 1 tablet (40 mg total) by mouth at bedtime.   NASAL SPRAY NA Place 1 spray into the nose 2 (two) times daily.   ONE TOUCH ULTRA 2 w/Device Kit Use as directed once per day   ONE TOUCH ULTRA TEST test strip Generic drug:  glucose blood USE AS DIRECTED   Vitamin D (Ergocalciferol) 50000 units Caps capsule Commonly known as:  DRISDOL Take 1 capsule (50,000 Units total) by mouth every 7 (seven) days.       Follow-up Information    Biagio Borg, MD Follow up.   Specialties:  Internal Medicine, Radiology Contact information: [address] [place] [address] [phone]            The results of significant diagnostics from this hospitalization (including imaging, microbiology, ancillary and laboratory) are listed below for reference.     Microbiology: No results found for this or any previous visit (from the past 240 hour(s)).   Labs: Basic Metabolic Panel: Recent Labs  Lab 05/07/17 0502 05/08/17 0435 05/09/17 0459 05/10/17 0440 05/11/17 0408  NA 138 140 139 137 137  K 4.6 4.9 4.5 4.2 4.5  CL 103 103 102 98* 99*  CO2 _0 GLUCOSE 150* 97 96 94 93  BUN 115* 108* 111* 113* 115*  CREATININE 2.44* 2.15* 2.37* 2.49* 2.57*  CALCIUM 8.5* 8.8* 8.3* 8.5* 8.1*  PHOS 5.4*  --   --   --   --    Liver Function Tests: Recent Labs  Lab 05/06/17 2328 05/07/17 0502  AST 22  --   ALT 19  --   ALKPHOS 42  --   BILITOT 0.7  --   PROT 7.2  --   ALBUMIN 3.6 3.1*   No results for input(s): LIPASE, AMYLASE in the last 168 hours. No results for input(s): AMMONIA in the last 168 hours. CBC: Recent Labs  Lab 05/07/17 0502 05/08/17 0435 05/09/17 0459 05/10/17 0440 05/11/17 0408  WBC 5.8 5.9 6.3 6.6 5.6  NEUTROABS 2.4  --   --   --   --   HGB 9.9* 10.8* 10.7* 12.0 11.1*  HCT 30.2* 33.6* 33.2*  37.2 34.8*  MCV 92.6 93.1 92.5 92.5 93.3  PLT 216 251 236 240 245   Cardiac Enzymes: Recent Labs  Lab 05/06/17 2328 05/07/17 0502 05/07/17 1121  TROPONINI <0.03 <0.03 <0.03   BNP: BNP (last 3 results) Recent Labs  05/06/17 2328  BNP 121.3*    ProBNP (last 3 results) No results for input(s): PROBNP in the last 8760 hours.  CBG: Recent Labs  Lab 05/10/17 0757 05/10/17 1145 05/10/17 1720 05/10/17 2125 05/11/17 0732  GLUCAP 101* 182* 131* 102* 100*     SIGNED: Time coordinating discharge: 60 minutes  Faye Ramsay, MD  [facility] 05/11/2017, 11:39 AM Pager [phone]  If 7PM-7AM, please contact night-coverage [email or web address] Password [email or web address]

## 2017-05-11 NOTE — Clinical Social Work Placement (Signed)
Patient received and accepted bed offer at [facility]. Facility aware of patient's discharge and confirmed bed offer. [facility] contacted, patient's family notified. Patient's RN can call report to [phone] room 112, packet complete. CSW signing off, no other needs identified at this time.  CLINICAL SOCIAL WORK PLACEMENT  NOTE  Date:  05/11/2017  Patient Details  Name: Michelle Morton MRN: [id number] Date of Birth: 1932-02-11  Clinical Social Work is seeking post-discharge placement for this patient at the Skilled  Nursing Facility level of care (*CSW will initial, date and re-position this form in  chart as items are completed):  Yes   Patient/family provided with [facility] Clinical Social Work Department's list of facilities offering this level of care within the geographic area requested by the patient (or if unable, by the patient's family).  Yes   Patient/family informed of their freedom to choose among providers that offer the needed level of care, that participate in Medicare, Medicaid or managed care program needed by the patient, have an available bed and are willing to accept the patient.  Yes   Patient/family informed of [facility]'s ownership interest in [facility] and [facility], as well as of the fact that they are under no obligation to receive care at these facilities.  PASRR submitted to EDS on 05/11/17     PASRR number received on 05/11/17     Existing PASRR number confirmed on       FL2 transmitted to all facilities in geographic area requested by pt/family on 05/10/17     FL2 transmitted to all facilities within larger geographic area on       Patient informed that his/her managed care company has contracts with or will negotiate with certain facilities, including the following:        Yes   Patient/family informed of bed offers received.  [facility]     Physician recommends and patient chooses  bed at      [facility] on 05/11/17.  Patient to be transferred to facility by [facility]     Patient family notified on 05/11/17 of transfer.  Name of family member notified:  Michelle Morton     PHYSICIAN       Additional Comment:    _______________________________________________ Antionette PolesKimberly L Enjoli Tidd, LCSW 05/11/2017, 1:40 PM

## 2017-05-11 NOTE — Discharge Instructions (Signed)
Acute Kidney Injury, Adult Acute kidney injury is a sudden worsening of kidney function. The kidneys are organs that have several jobs. They filter the blood to remove waste products and extra fluid. They also maintain a healthy balance of minerals and hormones in the body, which helps control blood pressure and keep bones strong. With this condition, your kidneys do not do their jobs as well as they should. This condition ranges from mild to severe. Over time it may develop into long-lasting (chronic) kidney disease. Early detection and treatment may prevent acute kidney injury from developing into a chronic condition. What are the causes? Common causes of this condition include:  A problem with blood flow to the kidneys. This may be caused by:  Low blood pressure (hypotension) or shock.  Blood loss.  Heart and blood vessel (cardiovascular) disease.  Severe burns.  Liver disease.  Direct damage to the kidneys. This may be caused by:  Certain medicines.  A kidney infection.  Poisoning.  Being around or in contact with toxic substances.  A surgical wound.  A hard, direct hit to the kidney area.  A sudden blockage of urine flow. This may be caused by:  Cancer.  Kidney stones.  An enlarged prostate in males. What are the signs or symptoms? Symptoms of this condition may not be obvious until the condition becomes severe. Symptoms of this condition can include:  Tiredness (lethargy), or difficulty staying awake.  Nausea or vomiting.  Swelling (edema) of the face, legs, ankles, or feet.  Problems with urination, such as:  Abdominal pain, or pain along the side of your stomach (flank).  Decreased urine production.  Decrease in the force of urine flow.  Muscle twitches and cramps, especially in the legs.  Confusion or trouble concentrating.  Loss of appetite.  Fever. How is this diagnosed? This condition may be diagnosed with tests, including:  Blood  tests.  Urine tests.  Imaging tests.  A test in which a sample of tissue is removed from the kidneys to be examined under a microscope (kidney biopsy). How is this treated? Treatment for this condition depends on the cause and how severe the condition is. In mild cases, treatment may not be needed. The kidneys may heal on their own. In more severe cases, treatment will involve:  Treating the cause of the kidney injury. This may involve changing any medicines you are taking or adjusting your dosage.  Fluids. You may need specialized IV fluids to balance your body's needs.  Having a catheter placed to drain urine and prevent blockages.  Preventing problems from occurring. This may mean avoiding certain medicines or procedures that can cause further injury to the kidneys. In some cases treatment may also require:  A procedure to remove toxic wastes from the body (dialysis or continuous renal replacement therapy - CRRT).  Surgery. This may be done to repair a torn kidney, or to remove the blockage from the urinary system. Follow these instructions at home: Medicines   Take over-the-counter and prescription medicines only as told by your health care provider.  Do not take any new medicines without your health care provider's approval. Many medicines can worsen your kidney damage.  Do not take any vitamin and mineral supplements without your health care provider's approval. Many nutritional supplements can worsen your kidney damage. Lifestyle   If your health care provider prescribed changes to your diet, follow them. You may need to decrease the amount of protein you eat.  Achieve and maintain a   healthy weight. If you need help with this, ask your health care provider.  Start or continue an exercise plan. Try to exercise at least 30 minutes a day, 5 days a week.  Do not use any tobacco products, such as cigarettes, chewing tobacco, and e-cigarettes. If you need help quitting, ask  your health care provider. General instructions   Keep track of your blood pressure. Report changes in your blood pressure as told by your health care provider.  Stay up to date with immunizations. Ask your health care provider which immunizations you need.  Keep all follow-up visits as told by your health care provider. This is important. Where to find more information:  American Association of Kidney Patients: www.aakp.org  National Kidney Foundation: www.kidney.org  American Kidney Fund: www.akfinc.org  Life Options Rehabilitation Program:  www.lifeoptions.org  www.kidneyschool.org Contact a health care provider if:  Your symptoms get worse.  You develop new symptoms. Get help right away if:  You develop symptoms of worsening kidney disease, which include:  Headaches.  Abnormally dark or light skin.  Easy bruising.  Frequent hiccups.  Chest pain.  Shortness of breath.  End of menstruation in women.  Seizures.  Confusion or altered mental status.  Abdominal or back pain.  Itchiness.  You have a fever.  Your body is producing less urine.  You have pain or bleeding when you urinate. Summary  Acute kidney injury is a sudden worsening of kidney function.  Acute kidney injury can be caused by problems with blood flow to the kidneys, direct damage to the kidneys, and sudden blockage of urine flow.  Symptoms of this condition may not be obvious until it becomes severe. Symptoms may include edema, lethargy, confusion, nausea or vomiting, and problems passing urine.  This condition can usually be diagnosed with blood tests, urine tests, and imaging tests. Sometimes a kidney biopsy is done to diagnose this condition.  Treatment for this condition often involves treating the underlying cause. It is treated with fluids, medicines, dialysis, diet changes, or surgery. This information is not intended to replace advice given to you by your health care provider.  Make sure you discuss any questions you have with your health care provider. Document Released: 11/30/2010 Document Revised: 05/07/2016 Document Reviewed: 05/07/2016 Elsevier Interactive Patient Education  2017 Elsevier Inc.  

## 2017-05-12 ENCOUNTER — Telehealth: Payer: Self-pay | Admitting: *Deleted

## 2017-05-12 ENCOUNTER — Non-Acute Institutional Stay (SKILLED_NURSING_FACILITY): Payer: Medicare Other | Admitting: Internal Medicine

## 2017-05-12 ENCOUNTER — Encounter: Payer: Self-pay | Admitting: Internal Medicine

## 2017-05-12 DIAGNOSIS — J309 Allergic rhinitis, unspecified: Secondary | ICD-10-CM | POA: Diagnosis not present

## 2017-05-12 DIAGNOSIS — I5031 Acute diastolic (congestive) heart failure: Secondary | ICD-10-CM | POA: Diagnosis not present

## 2017-05-12 DIAGNOSIS — N184 Chronic kidney disease, stage 4 (severe): Secondary | ICD-10-CM | POA: Diagnosis not present

## 2017-05-12 NOTE — Assessment & Plan Note (Addendum)
Intranasal steroid Continue Zyrtec  Mucinex for thick secretions

## 2017-05-12 NOTE — Telephone Encounter (Signed)
Pt was on TCM list admitted 05/06/17 for Acute diastolic CHF (congestive heart failure). Worsening with renal failure. Patient appears to be fluid overloaded on physical exam. Pt was D/c 05/11/17 to SNF. P[er summary will need to f/u w/PCP 2 weeks after being release from SNF...Michelle Morton/lmb

## 2017-05-12 NOTE — Patient Instructions (Signed)
See assessment and plan under each diagnosis in the problem list and acutely for this visit 

## 2017-05-12 NOTE — Progress Notes (Signed)
NURSING HOME LOCATION:  [facility] ROOM NUMBER:  [id number]  CODE STATUS:  Full Code  PCP:  [name], [name], MD  [address] [place] [place] [id number]   This is a comprehensive admission note to [facility] performed on this date less than 30 days from date of admission. Included are preadmission medical/surgical history;reconciled medication list; family history; social history and comprehensive review of systems.  Corrections and additions to the records were documented.  Comprehensive physical exam was also performed. Additionally a clinical summary was entered for each active diagnosis pertinent to this admission in the Problem List to enhance continuity of care.  HPI:  The patient was hospitalized 12/7-12/12/18 with acute diastolic congestive heart failure , presenting with progressive lower extremity edema and weight gain in the context of renal failure. According to her PCP's office records she had gained from 168 pounds up 183 pounds since 03/09/17. Blood pressure was mildly elevated otherwise vital signs were normal. Dr. Kathrene BongoGoldsborough, Nephrologist suggested checking albumin level and recommended avoiding additional diuretic use. Baseline creatinine was felt to be 2.00 Chest x-ray revealed stable cardiomegaly with vascular congestion and chronic interstitial changes. The patient has anemia of chronic disease with a baseline hemoglobin of 11. Hydralazine was prescribed for elevated systolic blood pressures. Labs 12/12 revealed BUN 15, creatinine 2.57, calcium 8.1, GFR less than 20, and hemoglobin 11.1.  Past medical and surgical history: Includes renal calculi, hypothyroidism, history of blood transfusion, and allergic rhinitis. Surgeries include TKR bilaterally, renal calculi removal, and hysterectomy.  Social history: Quit smoking 1993. Never drank.  Family history: Reviewed  Review of systems: The patient states she is essentially asymptomatic at this time after  correction of her edema. She freely admits that her salt intake had been excessive as explanation for the weight gain and edema. She has constipation if she does not take a stool softener. She has numbness and tingling in the right thigh which is a chronic issue. She has been clearing her throat producing white material. She has no other upper respiratory tract allergic or infectious symptoms.  She states that Dr. Eliott Nineunham has restricted her to 32 ounces of fluid intake daily.  Constitutional: No fever,significant weight change, fatigue  Eyes: No redness, discharge, pain, vision change ENT/mouth: No  purulent discharge, earache,change in hearing ,sore throat  Cardiovascular: No chest pain, palpitations,paroxysmal nocturnal dyspnea, claudication, edema  Respiratory: No hemoptysis, DOE , significant snoring,apnea  Gastrointestinal: No heartburn,dysphagia,abdominal pain, nausea / vomiting,rectal bleeding, melena Genitourinary: No dysuria,hematuria, pyuria,  incontinence, nocturia Musculoskeletal: No joint stiffness, joint swelling, weakness,pain Dermatologic: No rash, pruritus, change in appearance of skin Neurologic: No dizziness,headache,syncope, seizures Psychiatric: No significant anxiety , depression, insomnia, anorexia  Endocrine: No change in hair/skin/ nails, excessive thirst, excessive hunger, excessive urination  Hematologic/lymphatic: No significant bruising, lymphadenopathy,abnormal bleeding Allergy/immunology: No itchy/ watery eyes, significant sneezing, urticaria, angioedema  Physical exam:  Pertinent or positive findings: The patient is bright and alert and appears younger than her stated age. Arcus senilis is present. She has minor rales at the right lower lobe. Intermittently she had a slightly rattly cough. She has fusiform changes of her knees with operative scars bilaterally. She has isolated PIP fusiform changes.  General appearance:Adequately nourished; no acute distress ,  increased work of breathing is present.   Lymphatic: No lymphadenopathy about the head, neck, axilla . Eyes: No conjunctival inflammation or lid edema is present. There is no scleral icterus. Ears:  External ear exam shows no significant lesions  or deformities.   Nose:  External nasal examination shows no deformity or inflammation. Nasal mucosa are pink and moist without lesions ,exudates Oral exam: lips and gums are healthy appearing.There is no oropharyngeal erythema or exudate . Neck:  No thyromegaly, masses, tenderness noted.    Heart:  Normal rate and regular rhythm. S1 and S2 normal without gallop, murmur, click, rub .  Lungs:without wheezes, rubs. Abdomen:Bowel sounds are normal. Abdomen is soft and nontender with no organomegaly, hernias,masses. GU: deferred  Extremities:  No cyanosis, clubbing,edema  Neurologic exam : Strength equal  in upper & lower extremities Deep tendon reflexes are equal Skin: Warm & dry w/o tenting. No significant lesions or rash.  See clinical summary under each active problem in the Problem List with associated updated therapeutic plan

## 2017-05-12 NOTE — Assessment & Plan Note (Addendum)
05/12/17 patient admits to excess sodium intake Clinically there is no sign of active congestive heart failure Based on Up to date D/C Oxymetazoline

## 2017-05-12 NOTE — Assessment & Plan Note (Signed)
Fluid restriction to 32 ounces daily

## 2017-05-26 ENCOUNTER — Telehealth: Payer: Self-pay | Admitting: Internal Medicine

## 2017-05-26 NOTE — Telephone Encounter (Signed)
Ok for verbals 

## 2017-05-26 NOTE — Telephone Encounter (Signed)
Copied from CRM [phone]. Topic: Quick Communication - See Telephone Encounter >> May 26, 2017  2:50 PM Clack, [name] wrote: CRM for notification. See Telephone encounter for:  Michelle DresserConnie from [facility] calling requesting verbal orders skill nursing and PT and Ot.  just for evaluates  Contact number [phone]  05/26/17.

## 2017-05-27 ENCOUNTER — Telehealth: Payer: Self-pay | Admitting: Internal Medicine

## 2017-05-27 NOTE — Telephone Encounter (Signed)
Copied from CRM [phone]. Topic: Quick Communication - See Telephone Encounter >> May 26, 2017  2:50 PM Clack, [name] wrote: CRM for notification. See Telephone encounter for:  Junious DresserConnie from [facility] calling requesting verbal orders skill nursing and PT and Ot.  just for evaluates  Contact number [phone]  05/26/17. >> May 27, 2017  3:49 PM Jonette EvaBarksdale, Harvey B wrote: Nino Glowalmira called to check status of verbal orders for 2x's a week for 5 weeks, contact @ [phone]

## 2017-05-27 NOTE — Telephone Encounter (Signed)
Called connie no answer LMOM w/MD response...Raechel Chute/lmb

## 2017-05-27 NOTE — Telephone Encounter (Signed)
Called Almira back verbal was given this am to connie. See previous msg...Raechel Chute/lmb

## 2017-05-30 ENCOUNTER — Ambulatory Visit: Payer: Medicare Other | Admitting: Internal Medicine

## 2017-06-01 ENCOUNTER — Telehealth: Payer: Self-pay | Admitting: Internal Medicine

## 2017-06-01 NOTE — Telephone Encounter (Signed)
Copied from CRM [phone]#[id number]. Topic: Quick Communication - See Telephone Encounter >> Jun 01, 2017  4:33 PM Floria RavelingStovall, Shana A wrote: CRM for notification. See Telephone encounter for: Jone with [facility] [phone] Requesting verbals for OT  1 week 2 2 week 4   06/01/17.

## 2017-06-02 ENCOUNTER — Ambulatory Visit: Payer: Self-pay

## 2017-06-02 NOTE — Telephone Encounter (Signed)
Ok for verbals 

## 2017-06-02 NOTE — Telephone Encounter (Signed)
Patient's daughter Mayra ReelSheryl Osten called with patient having swelling to her legs, ankles, and feet x 1 week. She said she was told to call the doctor when her legs started swelling again and not go down. She says her knees are also swollen.  The daughter was asked to press on the patient's legs with her finger to see if the print is left in the legs, she says "yes, it's dented in."  When asked what color are the legs, feet, if they look red or infected, she denies, says they are normal colored and warm.  The patient was asked did her legs hurt when she stood up on them, she said "yes, 7 out of 10." The daughter reports that her mother has stage 4 kidney disease and was told the swelling may come back with the medicine she is taking. I asked was she taking her medication as instructed, the daughter stated "yes. She takes everything like she's supposed to." Daughter says the patient just got home from rehab and she did not have swelling when she got home.  Denies fever, chest pain, difficulty breathing with rest and ambulation. According to protocol, to be seen by PCP within 24 hours, appointment made for 06/03/17, care advice given, patient and daughter verbalized understanding.  Reason for Disposition . [1] MODERATE leg swelling (e.g., swelling extends up to knees) AND [2] new onset or worsening  Answer Assessment - Initial Assessment Questions 1. ONSET: "When did the swelling start?" (e.g., minutes, hours, days)     End of last week 2. LOCATION: "What part of the leg is swollen?"  "Are both legs swollen or just one leg?"     Both legs from knee down to feet 3. SEVERITY: "How bad is the swelling?" (e.g., localized; mild, moderate, severe)  - Localized - small area of swelling localized to one leg  - MILD pedal edema - swelling limited to foot and ankle, pitting edema < 1/4 inch (6 mm) deep, rest and elevation eliminate most or all swelling  - MODERATE edema - swelling of lower leg to knee, pitting edema >  1/4 inch (6 mm) deep, rest and elevation only partially reduce swelling  - SEVERE edema - swelling extends above knee, facial or hand swelling present      Moderate edema-pitting 4. REDNESS: "Does the swelling look red or infected?"     Denies 5. PAIN: "Is the swelling painful to touch?" If so, ask: "How painful is it?"   (Scale 1-10; mild, moderate or severe)     7 6. FEVER: "Do you have a fever?" If so, ask: "What is it, how was it measured, and when did it start?"      No 7. CAUSE: "What do you think is causing the leg swelling?"     Stage 4 kidney disease 8. MEDICAL HISTORY: "Do you have a history of heart failure, kidney disease, liver failure, or cancer?"     Stage 4 kidney disease, congestive heart failure 9. RECURRENT SYMPTOM: "Have you had leg swelling before?" If so, ask: "When was the last time?" "What happened that time?"     Yes-last month when she was admitted to the hospital 10. OTHER SYMPTOMS: "Do you have any other symptoms?" (e.g., chest pain, difficulty breathing)       Denies CP and SOB 11. PREGNANCY: "Is there any chance you are pregnant?" "When was your last menstrual period?"       N/A  Protocols used: LEG SWELLING AND EDEMA-A-AH

## 2017-06-02 NOTE — Telephone Encounter (Signed)
Notified Aurea GraffJoan w/md response...Raechel Chute/lmb

## 2017-06-03 ENCOUNTER — Encounter (HOSPITAL_COMMUNITY): Payer: Self-pay | Admitting: *Deleted

## 2017-06-03 ENCOUNTER — Emergency Department (HOSPITAL_COMMUNITY): Payer: Medicare Other

## 2017-06-03 ENCOUNTER — Other Ambulatory Visit: Payer: Self-pay

## 2017-06-03 ENCOUNTER — Ambulatory Visit (INDEPENDENT_AMBULATORY_CARE_PROVIDER_SITE_OTHER): Payer: Medicare Other | Admitting: Family

## 2017-06-03 ENCOUNTER — Inpatient Hospital Stay (HOSPITAL_COMMUNITY)
Admission: EM | Admit: 2017-06-03 | Discharge: 2017-06-08 | DRG: 291 | Disposition: A | Discharge status: Hospice | Payer: Medicare Other | Attending: Internal Medicine | Admitting: Internal Medicine

## 2017-06-03 ENCOUNTER — Encounter: Payer: Self-pay | Admitting: Family

## 2017-06-03 VITALS — BP 144/70 | HR 78 | Temp 97.7°F | Ht 64.0 in | Wt 182.0 lb

## 2017-06-03 DIAGNOSIS — Z96653 Presence of artificial knee joint, bilateral: Secondary | ICD-10-CM | POA: Diagnosis present

## 2017-06-03 DIAGNOSIS — R600 Localized edema: Secondary | ICD-10-CM

## 2017-06-03 DIAGNOSIS — R635 Abnormal weight gain: Secondary | ICD-10-CM | POA: Diagnosis not present

## 2017-06-03 DIAGNOSIS — Z833 Family history of diabetes mellitus: Secondary | ICD-10-CM

## 2017-06-03 DIAGNOSIS — D631 Anemia in chronic kidney disease: Secondary | ICD-10-CM | POA: Diagnosis present

## 2017-06-03 DIAGNOSIS — N184 Chronic kidney disease, stage 4 (severe): Secondary | ICD-10-CM | POA: Diagnosis not present

## 2017-06-03 DIAGNOSIS — H9191 Unspecified hearing loss, right ear: Secondary | ICD-10-CM | POA: Diagnosis present

## 2017-06-03 DIAGNOSIS — E1129 Type 2 diabetes mellitus with other diabetic kidney complication: Secondary | ICD-10-CM | POA: Diagnosis present

## 2017-06-03 DIAGNOSIS — Z515 Encounter for palliative care: Secondary | ICD-10-CM | POA: Diagnosis not present

## 2017-06-03 DIAGNOSIS — E1122 Type 2 diabetes mellitus with diabetic chronic kidney disease: Secondary | ICD-10-CM | POA: Diagnosis present

## 2017-06-03 DIAGNOSIS — M898X9 Other specified disorders of bone, unspecified site: Secondary | ICD-10-CM | POA: Diagnosis present

## 2017-06-03 DIAGNOSIS — I509 Heart failure, unspecified: Secondary | ICD-10-CM | POA: Diagnosis not present

## 2017-06-03 DIAGNOSIS — I5032 Chronic diastolic (congestive) heart failure: Secondary | ICD-10-CM

## 2017-06-03 DIAGNOSIS — I10 Essential (primary) hypertension: Secondary | ICD-10-CM | POA: Diagnosis present

## 2017-06-03 DIAGNOSIS — E785 Hyperlipidemia, unspecified: Secondary | ICD-10-CM | POA: Diagnosis present

## 2017-06-03 DIAGNOSIS — N189 Chronic kidney disease, unspecified: Secondary | ICD-10-CM

## 2017-06-03 DIAGNOSIS — I130 Hypertensive heart and chronic kidney disease with heart failure and stage 1 through stage 4 chronic kidney disease, or unspecified chronic kidney disease: Secondary | ICD-10-CM | POA: Diagnosis not present

## 2017-06-03 DIAGNOSIS — I5031 Acute diastolic (congestive) heart failure: Secondary | ICD-10-CM

## 2017-06-03 DIAGNOSIS — Z8249 Family history of ischemic heart disease and other diseases of the circulatory system: Secondary | ICD-10-CM

## 2017-06-03 DIAGNOSIS — N179 Acute kidney failure, unspecified: Secondary | ICD-10-CM | POA: Diagnosis present

## 2017-06-03 DIAGNOSIS — E039 Hypothyroidism, unspecified: Secondary | ICD-10-CM | POA: Diagnosis present

## 2017-06-03 DIAGNOSIS — Z79890 Hormone replacement therapy (postmenopausal): Secondary | ICD-10-CM

## 2017-06-03 DIAGNOSIS — Z803 Family history of malignant neoplasm of breast: Secondary | ICD-10-CM

## 2017-06-03 DIAGNOSIS — I5033 Acute on chronic diastolic (congestive) heart failure: Secondary | ICD-10-CM | POA: Diagnosis present

## 2017-06-03 DIAGNOSIS — Z87891 Personal history of nicotine dependence: Secondary | ICD-10-CM

## 2017-06-03 DIAGNOSIS — Z7984 Long term (current) use of oral hypoglycemic drugs: Secondary | ICD-10-CM

## 2017-06-03 DIAGNOSIS — Z87442 Personal history of urinary calculi: Secondary | ICD-10-CM

## 2017-06-03 DIAGNOSIS — Z66 Do not resuscitate: Secondary | ICD-10-CM | POA: Diagnosis not present

## 2017-06-03 LAB — CBC WITH DIFFERENTIAL/PLATELET
BASOS PCT: 0 %
Basophils Absolute: 0 10*3/uL (ref 0.0–0.1)
EOS ABS: 0.3 10*3/uL (ref 0.0–0.7)
EOS PCT: 5 %
HCT: 32.2 % — ABNORMAL LOW (ref 36.0–46.0)
Hemoglobin: 10.4 g/dL — ABNORMAL LOW (ref 12.0–15.0)
LYMPHS ABS: 1.6 10*3/uL (ref 0.7–4.0)
Lymphocytes Relative: 25 %
MCH: 29.7 pg (ref 26.0–34.0)
MCHC: 32.3 g/dL (ref 30.0–36.0)
MCV: 92 fL (ref 78.0–100.0)
MONO ABS: 0.3 10*3/uL (ref 0.1–1.0)
MONOS PCT: 4 %
Neutro Abs: 4.2 10*3/uL (ref 1.7–7.7)
Neutrophils Relative %: 66 %
Platelets: 269 10*3/uL (ref 150–400)
RBC: 3.5 MIL/uL — ABNORMAL LOW (ref 3.87–5.11)
RDW: 14.5 % (ref 11.5–15.5)
WBC: 6.3 10*3/uL (ref 4.0–10.5)

## 2017-06-03 LAB — COMPREHENSIVE METABOLIC PANEL
ALBUMIN: 3.7 g/dL (ref 3.5–5.0)
ALK PHOS: 52 U/L (ref 38–126)
ALT: 16 U/L (ref 14–54)
ANION GAP: 11 (ref 5–15)
AST: 19 U/L (ref 15–41)
BUN: 144 mg/dL — ABNORMAL HIGH (ref 6–20)
CALCIUM: 9.2 mg/dL (ref 8.9–10.3)
CO2: 21 mmol/L — AB (ref 22–32)
Chloride: 102 mmol/L (ref 101–111)
Creatinine, Ser: 2.96 mg/dL — ABNORMAL HIGH (ref 0.44–1.00)
GFR calc non Af Amer: 14 mL/min — ABNORMAL LOW (ref >60)
GFR, EST AFRICAN AMERICAN: 16 mL/min — AB (ref >60)
GLUCOSE: 225 mg/dL — AB (ref 65–99)
POTASSIUM: 4.5 mmol/L (ref 3.5–5.1)
SODIUM: 134 mmol/L — AB (ref 135–145)
TOTAL PROTEIN: 7.8 g/dL (ref 6.5–8.1)
Total Bilirubin: 0.6 mg/dL (ref 0.3–1.2)

## 2017-06-03 LAB — BRAIN NATRIURETIC PEPTIDE: B NATRIURETIC PEPTIDE 5: 115.3 pg/mL — AB (ref 0.0–100.0)

## 2017-06-03 NOTE — ED Triage Notes (Signed)
Pt in c/o SOB with fluid retention, pt reports wt gain of 14 lbs in 2 weeks, pt reports recent hospitalization for same symptoms, pt takes 40 mg BID, pt denies CP, denies n/v/d, A&O x4

## 2017-06-03 NOTE — ED Triage Notes (Signed)
Pt family to [facility] requesting update, apologized for delay. Given update about wait time

## 2017-06-03 NOTE — Progress Notes (Signed)
Michelle Morton is a 84 y.o. female with the following history as recorded in EpicCare:  Patient Active Problem List   Diagnosis Date Noted  . Weight gain 06/03/2017  . Acute diastolic CHF (congestive heart failure) ([place]) 05/07/2017  . Bilateral lower extremity edema 05/06/2017  . Osteoporosis 01/26/2017  . Degenerative disc disease, lumbar 12/29/2016  . Neck pain on left side 12/12/2016  . Leg cramps 06/05/2016  . General weakness 03/04/2016  . Hypoglycemia 12/17/2015  . RLS (restless legs syndrome) 12/17/2015  . Weight loss 12/17/2015  . Back pain 10/16/2015  . Right-sided chest pain 03/27/2015  . Diarrhea 03/27/2015  . Hoarseness 03/27/2015  . Loss of weight 03/27/2015  . Depression 01/07/2015  . Chronic kidney disease (CKD), stage IV (severe) ([place]) 01/07/2015  . AKI (acute kidney injury) ([place]) 10/09/2014  . Uterine prolaps 09/01/2013  . Peripheral edema 02/14/2013  . Allergic rhinitis 02/14/2013  . Obesity, Class II, BMI 35-39.9 05/12/2011  . Preventative health care 05/12/2011  . TINNITUS 01/09/2009  . Essential hypertension 05/05/2007  . Hypothyroidism 02/28/2007  . Diabetes ([place]) 02/28/2007  . Hyperlipidemia 02/28/2007  . Osteoarthrosis, unspecified whether generalized or localized, involving lower leg 02/28/2007    Current Outpatient Medications  Medication Sig Dispense Refill  . Blood Glucose Monitoring Suppl (ONE TOUCH ULTRA 2) w/Device KIT Use as directed once per day 1 each 0  . cetirizine (ZYRTEC) 10 MG tablet Take 10 mg by mouth daily.    . Cholecalciferol (VITAMIN D3) 50000 units CAPS Take 1 capsule by mouth every 7 (seven) days.    . diclofenac sodium (VOLTAREN) 1 % GEL APPLY 4 GRAMS TOPICALLY FOUR TIMES DAILY as needed 400 g 3  . furosemide (LASIX) 40 MG tablet Take 1 tablet (40 mg total) by mouth 2 (two) times daily. 30 tablet   . gabapentin (NEURONTIN) 100 MG capsule Take 2 capsules (200 mg total) by mouth at bedtime. 60 capsule 3  . JANUVIA 50 MG tablet  TAKE ONE TABLET BY MOUTH ONCE DAILY 90 tablet 2  . Lancets MISC Use as directed once per day 100 each 12  . levothyroxine (SYNTHROID, LEVOTHROID) 88 MCG tablet TAKE ONE TABLET BY MOUTH ONCE DAILY 90 tablet 0  . lovastatin (MEVACOR) 40 MG tablet Take 1 tablet (40 mg total) by mouth at bedtime. 90 tablet 3  . ONE TOUCH ULTRA TEST test strip USE AS DIRECTED 50 each 25  . Oxymetazoline HCl (NASAL SPRAY NA) Place 1 spray into the nose 2 (two) times daily.     No current facility-administered medications for this visit.     Allergies: Penicillins  Past Medical History:  Diagnosis Date  . Allergic rhinitis, cause unspecified 02/14/2013  . Arthritis   . Arthus phenomenon   . Diabetes mellitus, type 2 ([place])   . Hearing loss    right ear, no hearing aid  . History of blood transfusion    [name] - unsure number of units transfused  . Hyperlipidemia   . Hypertension   . Hypothyroidism   . Kidney stones   . Osteoarthritis, knee    knees - otc med prn  . SVD (spontaneous vaginal delivery)    x 4  . Varicose veins    lower legs    Past Surgical History:  Procedure Laterality Date  . ABDOMINAL HYSTERECTOMY    . ANTERIOR AND POSTERIOR REPAIR N/A 04/17/2014   Procedure: ANTERIOR (CYSTOCELE) AND POSTERIOR REPAIR (RECTOCELE);  Surgeon: Cheri Fowler, MD;  Location: [facility];  Service: Gynecology;  Laterality: N/A;  . APPENDECTOMY    . CATARACT EXTRACTION Bilateral   . COLONOSCOPY    . KIDNEY STONE SURGERY     removal of stone  . REPLACEMENT TOTAL KNEE Bilateral   . TONSILLECTOMY    . WISDOM TOOTH EXTRACTION      Family History  Problem Relation Age of Onset  . Diabetes Mother   . Diabetes Father   . Hypertension Father   . Breast cancer Daughter   . Diabetes Brother        x 1  . Diabetes Sister        x 4  . Colon cancer Neg Hx   . Esophageal cancer Neg Hx   . Pancreatic cancer Neg Hx   . Liver disease Neg Hx   . Kidney disease Neg Hx     Social History   Tobacco Use   . Smoking status: Former Smoker    Packs/day: 1.00    Years: 16.00    Pack years: 16.00    Types: Cigarettes    Last attempt to quit: 05/10/1992    Years since quitting: 25.0  . Smokeless tobacco: Never Used  Substance Use Topics  . Alcohol use: No    Alcohol/week: 0.0 oz    Subjective:  Patient's normal weight is 168 pounds; she is seen in the office today with a weight of 182 and concerns about severe swelling in both of her feet, ankles and up to her knees. I saw her for similar complaint on 12/7 and she was hospitalized for almost a week at that time. She does note that her feet are so swollen she cannot get any of her shoes on. She also admits that she has been feeling short of breath in the past few days; She is currently taking Lasix 40 mg bid and under care of nephrology; does not have a cardiologist that is managing her CHF;   Objective:  Vitals:   06/03/17 1423  BP: (!) 144/70  Pulse: 78  Temp: 97.7 F (36.5 C)  TempSrc: Oral  SpO2: 98%  Weight: 182 lb (82.6 kg)  Height: '5\' 4"'$  (1.626 m)    General: Well developed, well nourished, in no acute distress  Skin : Warm and dry.  Head: Normocephalic and atraumatic  Eyes: Sclera and conjunctiva clear; pupils round and reactive to light; extraocular movements intact  Ears: External normal; canals clear; tympanic membranes normal  Oropharynx: Pink, supple. No suspicious lesions  Neck: Supple without thyromegaly, adenopathy  Lungs: Respirations unlabored; clear to auscultation bilaterally without wheeze, rales, rhonchi  CVS exam: normal rate and regular rhythm.  Extremities: 3+ pedal edema, no cyanosis, clubbing  Vessels: Difficult to assess due to swelling Neurologic: Alert and oriented; speech intact; face symmetrical; moves all extremities well; CNII-XII intact without focal deficit  Assessment:  1. Pedal edema   2. Acute diastolic CHF (congestive heart failure) ([place])   3. Chronic kidney disease (CKD), stage IV (severe)  ([place])   4. Weight gain     Plan:  Due to 14 pound weight gain noted today and severity of swelling, patient is referred back to ER for probable admission; she agrees and expresses understanding; notes that she prefers to go to [facility] this time; ER nurse contacted with information; keep planned follow-up with her PCP, Dr. Jenny Reichmann for later this month.   No Follow-up on file.  No orders of the defined types were placed in this encounter.   Requested Prescriptions  No prescriptions requested or ordered in this encounter

## 2017-06-04 ENCOUNTER — Encounter (HOSPITAL_COMMUNITY): Payer: Self-pay | Admitting: Internal Medicine

## 2017-06-04 ENCOUNTER — Other Ambulatory Visit: Payer: Self-pay

## 2017-06-04 DIAGNOSIS — Z87442 Personal history of urinary calculi: Secondary | ICD-10-CM | POA: Diagnosis not present

## 2017-06-04 DIAGNOSIS — Z8249 Family history of ischemic heart disease and other diseases of the circulatory system: Secondary | ICD-10-CM | POA: Diagnosis not present

## 2017-06-04 DIAGNOSIS — H9191 Unspecified hearing loss, right ear: Secondary | ICD-10-CM | POA: Diagnosis present

## 2017-06-04 DIAGNOSIS — J209 Acute bronchitis, unspecified: Secondary | ICD-10-CM | POA: Diagnosis not present

## 2017-06-04 DIAGNOSIS — I5031 Acute diastolic (congestive) heart failure: Secondary | ICD-10-CM | POA: Diagnosis not present

## 2017-06-04 DIAGNOSIS — Z87891 Personal history of nicotine dependence: Secondary | ICD-10-CM | POA: Diagnosis not present

## 2017-06-04 DIAGNOSIS — I5033 Acute on chronic diastolic (congestive) heart failure: Secondary | ICD-10-CM | POA: Diagnosis present

## 2017-06-04 DIAGNOSIS — Z833 Family history of diabetes mellitus: Secondary | ICD-10-CM | POA: Diagnosis not present

## 2017-06-04 DIAGNOSIS — E1129 Type 2 diabetes mellitus with other diabetic kidney complication: Secondary | ICD-10-CM | POA: Diagnosis present

## 2017-06-04 DIAGNOSIS — Z66 Do not resuscitate: Secondary | ICD-10-CM | POA: Diagnosis not present

## 2017-06-04 DIAGNOSIS — E1122 Type 2 diabetes mellitus with diabetic chronic kidney disease: Secondary | ICD-10-CM | POA: Diagnosis present

## 2017-06-04 DIAGNOSIS — Z803 Family history of malignant neoplasm of breast: Secondary | ICD-10-CM | POA: Diagnosis not present

## 2017-06-04 DIAGNOSIS — Z96653 Presence of artificial knee joint, bilateral: Secondary | ICD-10-CM | POA: Diagnosis present

## 2017-06-04 DIAGNOSIS — M159 Polyosteoarthritis, unspecified: Secondary | ICD-10-CM | POA: Diagnosis not present

## 2017-06-04 DIAGNOSIS — I509 Heart failure, unspecified: Secondary | ICD-10-CM | POA: Diagnosis not present

## 2017-06-04 DIAGNOSIS — Z7189 Other specified counseling: Secondary | ICD-10-CM | POA: Diagnosis not present

## 2017-06-04 DIAGNOSIS — Z515 Encounter for palliative care: Secondary | ICD-10-CM | POA: Diagnosis not present

## 2017-06-04 DIAGNOSIS — Z7984 Long term (current) use of oral hypoglycemic drugs: Secondary | ICD-10-CM | POA: Diagnosis not present

## 2017-06-04 DIAGNOSIS — N184 Chronic kidney disease, stage 4 (severe): Secondary | ICD-10-CM | POA: Diagnosis present

## 2017-06-04 DIAGNOSIS — Z79890 Hormone replacement therapy (postmenopausal): Secondary | ICD-10-CM | POA: Diagnosis not present

## 2017-06-04 DIAGNOSIS — N179 Acute kidney failure, unspecified: Secondary | ICD-10-CM | POA: Diagnosis present

## 2017-06-04 DIAGNOSIS — I130 Hypertensive heart and chronic kidney disease with heart failure and stage 1 through stage 4 chronic kidney disease, or unspecified chronic kidney disease: Secondary | ICD-10-CM | POA: Diagnosis present

## 2017-06-04 DIAGNOSIS — E039 Hypothyroidism, unspecified: Secondary | ICD-10-CM | POA: Diagnosis present

## 2017-06-04 DIAGNOSIS — E785 Hyperlipidemia, unspecified: Secondary | ICD-10-CM | POA: Diagnosis present

## 2017-06-04 DIAGNOSIS — D631 Anemia in chronic kidney disease: Secondary | ICD-10-CM | POA: Diagnosis present

## 2017-06-04 DIAGNOSIS — M898X9 Other specified disorders of bone, unspecified site: Secondary | ICD-10-CM | POA: Diagnosis present

## 2017-06-04 DIAGNOSIS — I5032 Chronic diastolic (congestive) heart failure: Secondary | ICD-10-CM

## 2017-06-04 DIAGNOSIS — M6281 Muscle weakness (generalized): Secondary | ICD-10-CM | POA: Diagnosis not present

## 2017-06-04 LAB — GLUCOSE, CAPILLARY
Glucose-Capillary: 175 mg/dL — ABNORMAL HIGH (ref 65–99)
Glucose-Capillary: 95 mg/dL (ref 65–99)

## 2017-06-04 LAB — MAGNESIUM: Magnesium: 2.1 mg/dL (ref 1.7–2.4)

## 2017-06-04 LAB — CBG MONITORING, ED
GLUCOSE-CAPILLARY: 102 mg/dL — AB (ref 65–99)
Glucose-Capillary: 131 mg/dL — ABNORMAL HIGH (ref 65–99)

## 2017-06-04 LAB — TROPONIN I

## 2017-06-04 LAB — TSH: TSH: 3.076 u[IU]/mL (ref 0.350–4.500)

## 2017-06-04 MED ORDER — LINAGLIPTIN 5 MG PO TABS
5.0000 mg | ORAL_TABLET | Freq: Every day | ORAL | Status: DC
  Administered 2017-06-05 – 2017-06-08 (×4): 5 mg via ORAL
  Filled 2017-06-04 (×6): qty 1

## 2017-06-04 MED ORDER — HEPARIN SODIUM (PORCINE) 5000 UNIT/ML IJ SOLN
5000.0000 [IU] | Freq: Three times a day (TID) | INTRAMUSCULAR | Status: DC
  Administered 2017-06-04 – 2017-06-08 (×11): 5000 [IU] via SUBCUTANEOUS
  Filled 2017-06-04 (×12): qty 1

## 2017-06-04 MED ORDER — ONDANSETRON HCL 4 MG PO TABS: 4.0000 mg | ORAL_TABLET | Freq: Four times a day (QID) | ORAL | Status: DC | PRN

## 2017-06-04 MED ORDER — ACETAMINOPHEN 650 MG RE SUPP: 650.0000 mg | Freq: Four times a day (QID) | RECTAL | Status: DC | PRN

## 2017-06-04 MED ORDER — LEVOTHYROXINE SODIUM 88 MCG PO TABS
88.0000 ug | ORAL_TABLET | Freq: Every day | ORAL | Status: DC
  Administered 2017-06-05 – 2017-06-08 (×4): 88 ug via ORAL
  Filled 2017-06-04 (×7): qty 1

## 2017-06-04 MED ORDER — FUROSEMIDE 10 MG/ML IJ SOLN
80.0000 mg | Freq: Two times a day (BID) | INTRAMUSCULAR | Status: DC
  Administered 2017-06-04: 80 mg via INTRAVENOUS
  Filled 2017-06-04: qty 8

## 2017-06-04 MED ORDER — ONDANSETRON HCL 4 MG/2ML IJ SOLN: 4.0000 mg | Freq: Four times a day (QID) | INTRAMUSCULAR | Status: DC | PRN

## 2017-06-04 MED ORDER — FUROSEMIDE 10 MG/ML IJ SOLN
80.0000 mg | Freq: Once | INTRAMUSCULAR | Status: AC
  Administered 2017-06-04: 80 mg via INTRAVENOUS
  Filled 2017-06-04: qty 8

## 2017-06-04 MED ORDER — GABAPENTIN 100 MG PO CAPS
200.0000 mg | ORAL_CAPSULE | Freq: Every day | ORAL | Status: DC
  Administered 2017-06-04 – 2017-06-07 (×5): 200 mg via ORAL
  Filled 2017-06-04 (×5): qty 2

## 2017-06-04 MED ORDER — ACETAMINOPHEN 325 MG PO TABS
650.0000 mg | ORAL_TABLET | Freq: Four times a day (QID) | ORAL | Status: DC | PRN
  Administered 2017-06-06 – 2017-06-07 (×2): 650 mg via ORAL
  Filled 2017-06-04 (×2): qty 2

## 2017-06-04 MED ORDER — FUROSEMIDE 10 MG/ML IJ SOLN
80.0000 mg | Freq: Three times a day (TID) | INTRAMUSCULAR | Status: DC
  Administered 2017-06-04 – 2017-06-05 (×3): 80 mg via INTRAVENOUS
  Filled 2017-06-04 (×3): qty 8

## 2017-06-04 MED ORDER — PRAVASTATIN SODIUM 40 MG PO TABS
40.0000 mg | ORAL_TABLET | Freq: Every day | ORAL | Status: DC
  Administered 2017-06-04 – 2017-06-07 (×4): 40 mg via ORAL
  Filled 2017-06-04 (×4): qty 1

## 2017-06-04 MED ORDER — INSULIN ASPART 100 UNIT/ML ~~LOC~~ SOLN
0.0000 [IU] | Freq: Three times a day (TID) | SUBCUTANEOUS | Status: DC
  Administered 2017-06-04: 2 [IU] via SUBCUTANEOUS
  Administered 2017-06-05: 1 [IU] via SUBCUTANEOUS
  Administered 2017-06-05: 2 [IU] via SUBCUTANEOUS
  Administered 2017-06-06 – 2017-06-07 (×3): 1 [IU] via SUBCUTANEOUS

## 2017-06-04 NOTE — ED Notes (Signed)
Ambulated Pt in Hallway while on Pulse Ox. Pt started at 100%. Pt stayed at 100% while walking. Pt stated she did not feel short of breath at all while walking. Once Pt was back in the bed Pt stated she felt like she was Wheezing. Pt back in bed and hooked up to Heart Monitor.

## 2017-06-04 NOTE — Care Management Note (Addendum)
Case Management Note  Patient Details  Name: Michelle Morton MRN: [id number] Date of Birth: [birth year]  Subjective/Objective:   Patient presented to [facility] ED with c/o SOB BLE edema hx of CHF history  of HTN, HLD, hypothyroidism, DM type II, diastolic dysfunction last EF noted to be 55-60%. Admitted 12/18 with exacerbation of CHF.                 Action/Plan: ED CM met with patient at bedside to discuss transitional care planning. Patient reports living at home with daughter Michelle Morton [phone] primary caregiver. PCP Cathlean Cower. Ambulates without assistance, Discussed possible transitional care recommendations with [facility] services, patient is agreeable and selected [facility] she has been active with them in the past. CM will continue to follow for transitional care needs.    Expected Discharge Date:                  Expected Discharge Plan:  [place]  In-House Referral:     Discharge planning Services  CM Consult  Post Acute Care Choice:    Choice offered to:  Patient  DME Arranged:    DME Agency:     HH Arranged:  RN, PT, OT Disease Management [place] Agency:  [facility]  Status of Service:  In process, will continue to follow  If discussed at Long Length of Stay Meetings, dates discussed:    Additional CommentsLaurena Slimmer, RN 06/04/2017, 10:21 AM

## 2017-06-04 NOTE — ED Notes (Signed)
Purewick applied.

## 2017-06-04 NOTE — ED Notes (Signed)
Pt eating breakfast tray 

## 2017-06-04 NOTE — ED Notes (Signed)
  CBG 131  

## 2017-06-04 NOTE — Consult Note (Signed)
[facility] Renal Consultation Note     HPI: Michelle Morton is a 84 y.o. female with CKD IV secondary to DM and HTN,  Also used NSAIDs for years, stopped in 2016. Followed by Dr. Lorrene Reid in office q 3 mos.  W/u with neg SPEP/UPEP. Renal artery duplex 03/2015, negative for RAS. Baseline Cr has been around 1.5-2. Seemed to be worsening lately so was sent to [facility] classes. Not very interested in dialysis.   Recent [facility] admit 12/7-12/12 with CHF exacerbation/acute on chronic RF. Echo 12/8 with Grade 1 diastolic dysfunction EF 69-62%. Also had renal ultrasound 12/7 showing right hydronephrosis, unchanged since 2016. Diuresed with IV Lasix, Cr peaked at 2.8, down to 2.49 at discharge.   Admitted currently with c/os worsening dyspnea and lower extremity swelling. Takes Lasix '40mg'$  bid at home.  Noticed worsening DOE this week with ankles swelling. CXR showing vascular congestion/edema. Labs Na 134, K 4.5, Cr 2.96 with eGFR 16, Ca 9.2, WBC 6.3, Hgb 10.4.  Lasix '80mg'$  IV bid, started. UOP 1.2L over last 24 hours. She still feels SOB with exertion, got winded walking to bathroom in ED.     Past Medical History:  Diagnosis Date  . Allergic rhinitis, cause unspecified 02/14/2013  . Arthritis   . Arthus phenomenon   . Diabetes mellitus, type 2 ([place])   . Hearing loss    right ear, no hearing aid  . History of blood transfusion    [name] - unsure number of units transfused  . Hyperlipidemia   . Hypertension   . Hypothyroidism   . Kidney stones   . Osteoarthritis, knee    knees - otc med prn  . SVD (spontaneous vaginal delivery)    x 4  . Varicose veins    lower legs   Past Surgical History:  Procedure Laterality Date  . ABDOMINAL HYSTERECTOMY    . ANTERIOR AND POSTERIOR REPAIR N/A 04/17/2014   Procedure: ANTERIOR (CYSTOCELE) AND POSTERIOR REPAIR (RECTOCELE);  Surgeon: Cheri Fowler, MD;  Location: [facility];  Service: Gynecology;  Laterality: N/A;  . APPENDECTOMY    . CATARACT  EXTRACTION Bilateral   . COLONOSCOPY    . KIDNEY STONE SURGERY     removal of stone  . REPLACEMENT TOTAL KNEE Bilateral   . TONSILLECTOMY    . WISDOM TOOTH EXTRACTION     Family History  Problem Relation Age of Onset  . Diabetes Mother   . Diabetes Father   . Hypertension Father   . Breast cancer Daughter   . Diabetes Brother        x 1  . Diabetes Sister        x 4  . Colon cancer Neg Hx   . Esophageal cancer Neg Hx   . Pancreatic cancer Neg Hx   . Liver disease Neg Hx   . Kidney disease Neg Hx    Social History:  reports that she quit smoking about 25 years ago. Her smoking use included cigarettes. She has a 16.00 pack-year smoking history. she has never used smokeless tobacco. She reports that she does not drink alcohol or use drugs. Allergies  Allergen Reactions  . Penicillins Rash    Allergic to IV penicillin but tolerates the oral form Has patient had a PCN reaction causing immediate rash, facial/tongue/throat swelling, SOB or lightheadedness with hypotension: no Has patient had a PCN reaction causing severe rash involving mucus membranes or skin necrosis: no Has patient had a PCN reaction that required hospitalization  already in the hospital for other procedure  Has patient had a PCN reaction occurring within the last 10 years: no If all of the above answers are "NO", then ma   Prior to Admission medications   Medication Sig Start Date End Date Taking? Authorizing Provider  acetaminophen (TYLENOL) 500 MG tablet Take 1,000 mg by mouth at bedtime.   Yes [provider]  amiodarone (PACERONE) 100 MG tablet Take 100 mg by mouth daily.   Yes [provider]  Blood Glucose Monitoring Suppl (ONE TOUCH ULTRA 2) w/Device KIT Use as directed once per day 02/20/16  Yes Biagio Borg, MD  cetirizine (ZYRTEC) 10 MG tablet Take 10 mg by mouth daily.   Yes [provider]  Cholecalciferol (VITAMIN D3) 50000 units CAPS Take 1 capsule by mouth every 7 (seven)  days.   Yes [provider]  diclofenac sodium (VOLTAREN) 1 % GEL APPLY 4 GRAMS TOPICALLY FOUR TIMES DAILY as needed Patient taking differently: Apply 4 g topically 4 (four) times daily as needed (PAIN).  06/04/16  Yes Biagio Borg, MD  furosemide (LASIX) 40 MG tablet Take 1 tablet (40 mg total) by mouth 2 (two) times daily. 05/13/17  Yes Theodis Blaze, MD  gabapentin (NEURONTIN) 100 MG capsule Take 2 capsules (200 mg total) by mouth at bedtime. 01/26/17  Yes Lyndal Pulley, DO  JANUVIA 50 MG tablet TAKE ONE TABLET BY MOUTH ONCE DAILY 03/21/17  Yes Biagio Borg, MD  Lancets MISC Use as directed once per day 02/20/16  Yes Biagio Borg, MD  levothyroxine (SYNTHROID, LEVOTHROID) 88 MCG tablet TAKE ONE TABLET BY MOUTH ONCE DAILY 12/04/15  Yes Biagio Borg, MD  lovastatin (MEVACOR) 40 MG tablet Take 1 tablet (40 mg total) by mouth at bedtime. 05/07/16  Yes Biagio Borg, MD  ONE TOUCH ULTRA TEST test strip USE AS DIRECTED 02/22/17  Yes Biagio Borg, MD  Oxymetazoline HCl (NASAL SPRAY NA) Place 1 spray into the nose 2 (two) times daily as needed (allergies).    Yes [provider]   Current Facility-Administered Medications  Medication Dose Route Frequency Provider Last Rate Last Dose  . acetaminophen (TYLENOL) tablet 650 mg  650 mg Oral Q6H PRN Rise Patience, MD       Or  . acetaminophen (TYLENOL) suppository 650 mg  650 mg Rectal Q6H PRN Rise Patience, MD      . furosemide (LASIX) injection 80 mg  80 mg Intravenous BID Rise Patience, MD   80 mg at 06/04/17 1216  . gabapentin (NEURONTIN) capsule 200 mg  200 mg Oral QHS Rise Patience, MD   200 mg at 06/04/17 0214  . heparin injection 5,000 Units  5,000 Units Subcutaneous Q8H Rise Patience, MD      . insulin aspart (novoLOG) injection 0-9 Units  0-9 Units Subcutaneous TID WC Rise Patience, MD      . levothyroxine (SYNTHROID, LEVOTHROID) tablet 88 mcg  88 mcg Oral QAC breakfast Rise Patience, MD      . linagliptin (TRADJENTA) tablet 5 mg  5 mg Oral Daily Rise Patience, MD      . ondansetron [facility]) tablet 4 mg  4 mg Oral Q6H PRN Rise Patience, MD       Or  . ondansetron [facility]) injection 4 mg  4 mg Intravenous Q6H PRN Rise Patience, MD      . pravastatin (PRAVACHOL) tablet 40 mg  40 mg Oral q1800 Rise Patience, MD       Current Outpatient Medications  Medication Sig Dispense Refill  . acetaminophen (TYLENOL) 500 MG tablet Take 1,000 mg by mouth at bedtime.    Marland Kitchen amiodarone (PACERONE) 100 MG tablet Take 100 mg by mouth daily.    . Blood Glucose Monitoring Suppl (ONE TOUCH ULTRA 2) w/Device KIT Use as directed once per day 1 each 0  . cetirizine (ZYRTEC) 10 MG tablet Take 10 mg by mouth daily.    . Cholecalciferol (VITAMIN D3) 50000 units CAPS Take 1 capsule by mouth every 7 (seven) days.    . diclofenac sodium (VOLTAREN) 1 % GEL APPLY 4 GRAMS TOPICALLY FOUR TIMES DAILY as needed (Patient taking differently: Apply 4 g topically 4 (four) times daily as needed (PAIN). ) 400 g 3  . furosemide (LASIX) 40 MG tablet Take 1 tablet (40 mg total) by mouth 2 (two) times daily. 30 tablet   . gabapentin (NEURONTIN) 100 MG capsule Take 2 capsules (200 mg total) by mouth at bedtime. 60 capsule 3  . JANUVIA 50 MG tablet TAKE ONE TABLET BY MOUTH ONCE DAILY 90 tablet 2  . Lancets MISC Use as directed once per day 100 each 12  . levothyroxine (SYNTHROID, LEVOTHROID) 88 MCG tablet TAKE ONE TABLET BY MOUTH ONCE DAILY 90 tablet 0  . lovastatin (MEVACOR) 40 MG tablet Take 1 tablet (40 mg total) by mouth at bedtime. 90 tablet 3  . ONE TOUCH ULTRA TEST test strip USE AS DIRECTED 50 each 25  . Oxymetazoline HCl (NASAL SPRAY NA) Place 1 spray into the nose 2 (two) times daily as needed (allergies).       ROS: As per HPI otherwise negative.  Physical Exam: Vitals:   06/04/17 1045 06/04/17 1047 06/04/17 1100 06/04/17 1115  BP: (!) 112/56 (!) 112/56 (!) 111/54 (!)  112/58  Pulse: 72 73 69 74  Resp: '12 13 13 14  '$ Temp:      TempSrc:      SpO2: 100% 100% 100% 100%  Weight:      Height:         General: WDWN elderly female NAD Head: NCAT sclera not icteric MMM Neck: Supple. JVD elevated  Lungs:  Coarse rales throughout  Heart: RRR with S1 S2 Abdomen: soft NT + BS Lower extremities: 2+ pitting pretibial edema bilat Neuro: A & O  X 3. Moves all extremities spontaneously. Psych:  Responds to questions appropriately with a normal affect.  Labs: Basic Metabolic Panel: Recent Labs  Lab 06/03/17 1824  NA 134*  K 4.5  CL 102  CO2 21*  GLUCOSE 225*  BUN 144*  CREATININE 2.96*  CALCIUM 9.2   Liver Function Tests: Recent Labs  Lab 06/03/17 1824  AST 19  ALT 16  ALKPHOS 52  BILITOT 0.6  PROT 7.8  ALBUMIN 3.7   No results for input(s): LIPASE, AMYLASE in the last 168 hours. No results for input(s): AMMONIA in the last 168 hours. CBC: Recent Labs  Lab 06/03/17 1824  WBC 6.3  NEUTROABS 4.2  HGB 10.4*  HCT 32.2*  MCV 92.0  PLT 269   Cardiac Enzymes: Recent Labs  Lab 06/04/17 0637  TROPONINI <0.03   CBG: Recent Labs  Lab 06/04/17 0925 06/04/17 1217  GLUCAP 102* 131*   Iron Studies: No results for input(s): IRON, TIBC, TRANSFERRIN, FERRITIN in the last 72 hours. Studies/Results: Dg Chest 2 View  Result Date: 06/03/2017 CLINICAL DATA:  Wheezing and  shortness of breath for 1 week. Bilateral leg swelling and weight gain over 2 weeks. EXAM: CHEST  2 VIEW COMPARISON:  05/06/2017 FINDINGS: Shallow inspiration. Mild cardiac enlargement and pulmonary vascular congestion. Developing fine interstitial pattern suggest early developing interstitial edema. Calcification of the aorta. No pneumothorax. IMPRESSION: Cardiac enlargement with mild pulmonary vascular congestion and developing interstitial edema. Electronically Signed   By: Lucienne Capers M.D.   On: 06/03/2017 19:12    Assessment/Plan: 1. Acute on chronic dCHF. Grade 1  diastolic dysfunction EF 10-25% on Echo 12/8 IV Lasix 80 mg BID started per primary.  2.  AKI on CKD IV. Baseline Cr over last year has been 1.8 -2.2. Cr 2.96 on admit. Diuresing with IV Lasix as above. UOP 1.2L over 24 hours.  Allow diuresis for now, follow labs closely.  3.  Hypertension- BP controlled. Amlodipine on outpatient med list  4.  Anemia  - Hgb 10.4. No ESA yet. Follow trend  5.  Metabolic bone disease -  Ca ok. On Calcitriol  0.5 mcg qd as outpatient  6. DM on [place] PA-C [place] Pager [phone] 06/04/2017, 1:54 PM

## 2017-06-04 NOTE — ED Provider Notes (Signed)
[facility] EMERGENCY DEPARTMENT Provider Note   CSN: [id number] Arrival date & time: 06/03/17  1752     History   Chief Complaint Chief Complaint  Patient presents with  . Shortness of Breath  . Leg Swelling    HPI Michelle Morton is a 84 y.o. female.  HPI   84 y.o.femalewith medical history significant of HTN, HLD, hypothyroidism, DM type II, diastolic dysfunction last EF noted to be 55-60% with grade 1 dFx on 07/2016, and CKD stage IV.  Recently discharged from [facility] 1 month ago.  Patient has gained 14 pounds since then, patient complains of dyspnea, shortness of breath and bilateral edema in her lower extremities.  Patient sent here by her primary physician for IV diuresis.    Past Medical History:  Diagnosis Date  . Allergic rhinitis, cause unspecified 02/14/2013  . Arthritis   . Arthus phenomenon   . Diabetes mellitus, type 2 ([place])   . Hearing loss    right ear, no hearing aid  . History of blood transfusion    [name] - unsure number of units transfused  . Hyperlipidemia   . Hypertension   . Hypothyroidism   . Kidney stones   . Osteoarthritis, knee    knees - otc med prn  . SVD (spontaneous vaginal delivery)    x 4  . Varicose veins    lower legs    Patient Active Problem List   Diagnosis Date Noted  . Weight gain 06/03/2017  . Acute diastolic CHF (congestive heart failure) ([place]) 05/07/2017  . Bilateral lower extremity edema 05/06/2017  . Osteoporosis 01/26/2017  . Degenerative disc disease, lumbar 12/29/2016  . Neck pain on left side 12/12/2016  . Leg cramps 06/05/2016  . General weakness 03/04/2016  . Hypoglycemia 12/17/2015  . RLS (restless legs syndrome) 12/17/2015  . Weight loss 12/17/2015  . Back pain 10/16/2015  . Right-sided chest pain 03/27/2015  . Diarrhea 03/27/2015  . Hoarseness 03/27/2015  . Loss of weight 03/27/2015  . Depression 01/07/2015  . Chronic kidney disease (CKD), stage IV (severe) ([place])  01/07/2015  . AKI (acute kidney injury) ([place]) 10/09/2014  . Uterine prolaps 09/01/2013  . Peripheral edema 02/14/2013  . Allergic rhinitis 02/14/2013  . Obesity, Class II, BMI 35-39.9 05/12/2011  . Preventative health care 05/12/2011  . TINNITUS 01/09/2009  . Essential hypertension 05/05/2007  . Hypothyroidism 02/28/2007  . Diabetes ([place]) 02/28/2007  . Hyperlipidemia 02/28/2007  . Osteoarthrosis, unspecified whether generalized or localized, involving lower leg 02/28/2007    Past Surgical History:  Procedure Laterality Date  . ABDOMINAL HYSTERECTOMY    . ANTERIOR AND POSTERIOR REPAIR N/A 04/17/2014   Procedure: ANTERIOR (CYSTOCELE) AND POSTERIOR REPAIR (RECTOCELE);  Surgeon: Cheri Fowler, MD;  Location: [facility];  Service: Gynecology;  Laterality: N/A;  . APPENDECTOMY    . CATARACT EXTRACTION Bilateral   . COLONOSCOPY    . KIDNEY STONE SURGERY     removal of stone  . REPLACEMENT TOTAL KNEE Bilateral   . TONSILLECTOMY    . WISDOM TOOTH EXTRACTION      OB History    No data available       Home Medications    Prior to Admission medications   Medication Sig Start Date End Date Taking? Authorizing Provider  Blood Glucose Monitoring Suppl (ONE TOUCH ULTRA 2) w/Device KIT Use as directed once per day 02/20/16   Biagio Borg, MD  cetirizine (ZYRTEC) 10 MG tablet Take 10 mg by  mouth daily.    [provider]  Cholecalciferol (VITAMIN D3) 50000 units CAPS Take 1 capsule by mouth every 7 (seven) days.    [provider]  diclofenac sodium (VOLTAREN) 1 % GEL APPLY 4 GRAMS TOPICALLY FOUR TIMES DAILY as needed 06/04/16   Biagio Borg, MD  furosemide (LASIX) 40 MG tablet Take 1 tablet (40 mg total) by mouth 2 (two) times daily. 05/13/17   Theodis Blaze, MD  gabapentin (NEURONTIN) 100 MG capsule Take 2 capsules (200 mg total) by mouth at bedtime. 01/26/17   Lyndal Pulley, DO  JANUVIA 50 MG tablet TAKE ONE TABLET BY MOUTH ONCE DAILY 03/21/17   Biagio Borg, MD    Lancets MISC Use as directed once per day 02/20/16   Biagio Borg, MD  levothyroxine (SYNTHROID, LEVOTHROID) 88 MCG tablet TAKE ONE TABLET BY MOUTH ONCE DAILY 12/04/15   Biagio Borg, MD  lovastatin (MEVACOR) 40 MG tablet Take 1 tablet (40 mg total) by mouth at bedtime. 05/07/16   Biagio Borg, MD  ONE TOUCH ULTRA TEST test strip USE AS DIRECTED 02/22/17   Biagio Borg, MD  Oxymetazoline HCl (NASAL SPRAY NA) Place 1 spray into the nose 2 (two) times daily.    [provider]    Family History Family History  Problem Relation Age of Onset  . Diabetes Mother   . Diabetes Father   . Hypertension Father   . Breast cancer Daughter   . Diabetes Brother        x 1  . Diabetes Sister        x 4  . Colon cancer Neg Hx   . Esophageal cancer Neg Hx   . Pancreatic cancer Neg Hx   . Liver disease Neg Hx   . Kidney disease Neg Hx     Social History Social History   Tobacco Use  . Smoking status: Former Smoker    Packs/day: 1.00    Years: 16.00    Pack years: 16.00    Types: Cigarettes    Last attempt to quit: 05/10/1992    Years since quitting: 25.0  . Smokeless tobacco: Never Used  Substance Use Topics  . Alcohol use: No    Alcohol/week: 0.0 oz  . Drug use: No     Allergies   Penicillins   Review of Systems Review of Systems  Constitutional: Positive for fatigue. Negative for activity change.  Respiratory: Positive for shortness of breath.   Cardiovascular: Positive for leg swelling. Negative for chest pain.  Gastrointestinal: Negative for abdominal pain.  All other systems reviewed and are negative.    Physical Exam Updated Vital Signs BP 125/62 (BP Location: Left Arm)   Pulse 86   Temp 97.7 F (36.5 C) (Oral)   Resp (!) 24   Ht '5\' 6"'$  (1.676 m)   Wt 82.6 kg (182 lb)   SpO2 97%   BMI 29.38 kg/m   Physical Exam  Constitutional: She is oriented to person, place, and time. She appears well-developed and well-nourished.  HENT:  Head: Normocephalic  and atraumatic.  Eyes: Right eye exhibits no discharge.  Cardiovascular: Normal rate, regular rhythm and normal heart sounds.  No murmur heard. Pulmonary/Chest: Accessory muscle usage present. Tachypnea noted. She has no wheezes. She has rales.  Abdominal: Soft. She exhibits no distension. There is no tenderness.  Neurological: She is oriented to person, place, and time.  Skin: Skin is warm and dry. She is not diaphoretic.  Psychiatric: She has a normal mood and affect.  Nursing note and vitals reviewed.    ED Treatments / Results  Labs (all labs ordered are listed, but only abnormal results are displayed) Labs Reviewed  BRAIN NATRIURETIC PEPTIDE - Abnormal; Notable for the following components:      Result Value   B Natriuretic Peptide 115.3 (*)    All other components within normal limits  CBC WITH DIFFERENTIAL/PLATELET - Abnormal; Notable for the following components:   RBC 3.50 (*)    Hemoglobin 10.4 (*)    HCT 32.2 (*)    All other components within normal limits  COMPREHENSIVE METABOLIC PANEL - Abnormal; Notable for the following components:   Sodium 134 (*)    CO2 21 (*)    Glucose, Bld 225 (*)    BUN 144 (*)    Creatinine, Ser 2.96 (*)    GFR calc non Af Amer 14 (*)    GFR calc Af Amer 16 (*)    All other components within normal limits    EKG  EKG Interpretation None       Radiology Dg Chest 2 View  Result Date: 06/03/2017 CLINICAL DATA:  Wheezing and shortness of breath for 1 week. Bilateral leg swelling and weight gain over 2 weeks. EXAM: CHEST  2 VIEW COMPARISON:  05/06/2017 FINDINGS: Shallow inspiration. Mild cardiac enlargement and pulmonary vascular congestion. Developing fine interstitial pattern suggest early developing interstitial edema. Calcification of the aorta. No pneumothorax. IMPRESSION: Cardiac enlargement with mild pulmonary vascular congestion and developing interstitial edema. Electronically Signed   By: Lucienne Capers M.D.   On: 06/03/2017  19:12    Procedures Procedures (including critical care time)  Medications Ordered in ED Medications - No data to display   Initial Impression / Assessment and Plan / ED Course  I have reviewed the triage vital signs and the nursing notes.  Pertinent labs & imaging results that were available during my care of the patient were reviewed by me and considered in my medical decision making (see chart for details).     84 y.o.femalewith medical history significant of HTN, HLD, hypothyroidism, DM type II, diastolic dysfunction last EF noted to be 55-60% with grade 1 dFx on 07/2016, and CKD stage IV.  Recently discharged from [facility] 1 month ago.  Patient has gained 14 pounds since then, patient complains of dyspnea, shortness of breath and bilateral edema in her lower extremities.  Patient sent here by her primary physician for IV diuresis  12:04 AM PA sent patient to the ED for IV diuresis.  Patient did not drop her oxygen saturation walking however she became tachypneic and felt short of breath.  Given the 14 pound weight gain in the use of PO diuretics at home without any diuresis, will admit for further IV diuresis.  Final Clinical Impressions(s) / ED Diagnoses   Final diagnoses:  None    ED Discharge Orders    None       Macarthur Critchley, MD 06/04/17 [phone]

## 2017-06-04 NOTE — ED Notes (Signed)
No yellow labels available. 

## 2017-06-04 NOTE — ED Notes (Signed)
Gold top tube collected for tsh blood test approximately 0313 and sent to lab.  Main lab states no gold top tube received.  Notified nurse and she will pull blood from IV.

## 2017-06-04 NOTE — H&P (Signed)
History and Physical    [name] RJJ:[id number] DOB: 01-15-1932 DOA: 06/03/2017  PCP: Biagio Borg, MD  Patient coming from: Home.  Chief Complaint: Shortness of breath and lower extremity edema.  HPI: Michelle Morton is a 84 y.o. female with history of chronic diastolic CHF last EF measured in March 2018 was 55-60% with grade 1 diastolic dysfunction was referred to the ER by patient's primary care physician as patient was found to have increasing lower extremity edema and shortness of breath.  Patient was recently admitted in December last month for similar complaints and was placed on diuresis following which patient improved.  At discharge patient's weight was around 168 pounds.  Presently on admission is 182 pounds.  Patient states she has been compliant with her medications.  Denies any chest pain or fever chills.  ED Course: In the ER on exam patient has lower extremity edema extending up to the thighs.  Chest x-ray was compatible with pulmonary edema.  Patient was given Lasix 80 mg IV and admitted for further management of CHF.  Patient's creatinine has increased from baseline.  Review of Systems: As per HPI, rest all negative.   Past Medical History:  Diagnosis Date  . Allergic rhinitis, cause unspecified 02/14/2013  . Arthritis   . Arthus phenomenon   . Diabetes mellitus, type 2 ([place])   . Hearing loss    right ear, no hearing aid  . History of blood transfusion    [name] - unsure number of units transfused  . Hyperlipidemia   . Hypertension   . Hypothyroidism   . Kidney stones   . Osteoarthritis, knee    knees - otc med prn  . SVD (spontaneous vaginal delivery)    x 4  . Varicose veins    lower legs    Past Surgical History:  Procedure Laterality Date  . ABDOMINAL HYSTERECTOMY    . ANTERIOR AND POSTERIOR REPAIR N/A 04/17/2014   Procedure: ANTERIOR (CYSTOCELE) AND POSTERIOR REPAIR (RECTOCELE);  Surgeon: Cheri Fowler, MD;  Location: [facility];  Service:  Gynecology;  Laterality: N/A;  . APPENDECTOMY    . CATARACT EXTRACTION Bilateral   . COLONOSCOPY    . KIDNEY STONE SURGERY     removal of stone  . REPLACEMENT TOTAL KNEE Bilateral   . TONSILLECTOMY    . WISDOM TOOTH EXTRACTION       reports that she quit smoking about 25 years ago. Her smoking use included cigarettes. She has a 16.00 pack-year smoking history. she has never used smokeless tobacco. She reports that she does not drink alcohol or use drugs.  Allergies  Allergen Reactions  . Penicillins Rash    Allergic to IV penicillin but tolerates the oral form Has patient had a PCN reaction causing immediate rash, facial/tongue/throat swelling, SOB or lightheadedness with hypotension: no Has patient had a PCN reaction causing severe rash involving mucus membranes or skin necrosis: no Has patient had a PCN reaction that required hospitalization already in the hospital for other procedure  Has patient had a PCN reaction occurring within the last 10 years: no If all of the above answers are "NO", then ma    Family History  Problem Relation Age of Onset  . Diabetes Mother   . Diabetes Father   . Hypertension Father   . Breast cancer Daughter   . Diabetes Brother        x 1  . Diabetes Sister  x 4  . Colon cancer Neg Hx   . Esophageal cancer Neg Hx   . Pancreatic cancer Neg Hx   . Liver disease Neg Hx   . Kidney disease Neg Hx     Prior to Admission medications   Medication Sig Start Date End Date Taking? Authorizing Provider  Blood Glucose Monitoring Suppl (ONE TOUCH ULTRA 2) w/Device KIT Use as directed once per day 02/20/16   Biagio Borg, MD  cetirizine (ZYRTEC) 10 MG tablet Take 10 mg by mouth daily.    [provider]  Cholecalciferol (VITAMIN D3) 50000 units CAPS Take 1 capsule by mouth every 7 (seven) days.    [provider]  diclofenac sodium (VOLTAREN) 1 % GEL APPLY 4 GRAMS TOPICALLY FOUR TIMES DAILY as needed 06/04/16   Biagio Borg, MD    furosemide (LASIX) 40 MG tablet Take 1 tablet (40 mg total) by mouth 2 (two) times daily. 05/13/17   Theodis Blaze, MD  gabapentin (NEURONTIN) 100 MG capsule Take 2 capsules (200 mg total) by mouth at bedtime. 01/26/17   Lyndal Pulley, DO  JANUVIA 50 MG tablet TAKE ONE TABLET BY MOUTH ONCE DAILY 03/21/17   Biagio Borg, MD  Lancets MISC Use as directed once per day 02/20/16   Biagio Borg, MD  levothyroxine (SYNTHROID, LEVOTHROID) 88 MCG tablet TAKE ONE TABLET BY MOUTH ONCE DAILY 12/04/15   Biagio Borg, MD  lovastatin (MEVACOR) 40 MG tablet Take 1 tablet (40 mg total) by mouth at bedtime. 05/07/16   Biagio Borg, MD  ONE TOUCH ULTRA TEST test strip USE AS DIRECTED 02/22/17   Biagio Borg, MD  Oxymetazoline HCl (NASAL SPRAY NA) Place 1 spray into the nose 2 (two) times daily.    [provider]    Physical Exam: Vitals:   06/03/17 1807 06/03/17 1809 06/03/17 1811 06/03/17 2115  BP:   120/65 125/62  Pulse:   (!) 47 86  Resp:   (!) 24   Temp:   97.7 F (36.5 C)   TempSrc:   Oral   SpO2:   92% 97%  Weight:  82.6 kg (182 lb)    Height: _0  (1.676 m)         Constitutional: Moderately built and nourished. Vitals:   06/03/17 1807 06/03/17 1809 06/03/17 1811 06/03/17 2115  BP:   120/65 125/62  Pulse:   (!) 47 86  Resp:   (!) 24   Temp:   97.7 F (36.5 C)   TempSrc:   Oral   SpO2:   92% 97%  Weight:  82.6 kg (182 lb)    Height: _1  (1.676 m)      Eyes: Anicteric no pallor. ENMT: No discharge from the ears eyes nose or mouth. Neck: JVD elevated no mass felt. Respiratory: No rhonchi or crepitations. Cardiovascular: S1-S2 heard no murmurs appreciated. Abdomen: Soft nontender bowel sounds present. Musculoskeletal: Bilateral lower extremity edema extending up to the thighs. Skin: No rash. Neurologic: Alert awake oriented to time place and person.  Moves all extremities. Psychiatric: Appears normal.  Normal affect.   Labs on Admission: I have personally reviewed  following labs and imaging studies  CBC: Recent Labs  Lab 06/03/17 1824  WBC 6.3  NEUTROABS 4.2  HGB 10.4*  HCT 32.2*  MCV 92.0  PLT 096   Basic Metabolic Panel: Recent Labs  Lab 06/03/17 1824  NA 134*  K 4.5  CL 102  CO2 21*  GLUCOSE 225*  BUN 144*  CREATININE 2.96*  CALCIUM 9.2   GFR: Estimated Creatinine Clearance: 15.6 mL/min (A) (by C-G formula based on SCr of 2.96 mg/dL (H)). Liver Function Tests: Recent Labs  Lab 06/03/17 1824  AST 19  ALT 16  ALKPHOS 52  BILITOT 0.6  PROT 7.8  ALBUMIN 3.7   No results for input(s): LIPASE, AMYLASE in the last 168 hours. No results for input(s): AMMONIA in the last 168 hours. Coagulation Profile: No results for input(s): INR, PROTIME in the last 168 hours. Cardiac Enzymes: No results for input(s): CKTOTAL, CKMB, CKMBINDEX, TROPONINI in the last 168 hours. BNP (last 3 results) No results for input(s): PROBNP in the last 8760 hours. HbA1C: No results for input(s): HGBA1C in the last 72 hours. CBG: No results for input(s): GLUCAP in the last 168 hours. Lipid Profile: No results for input(s): CHOL, HDL, LDLCALC, TRIG, CHOLHDL, LDLDIRECT in the last 72 hours. Thyroid Function Tests: No results for input(s): TSH, T4TOTAL, FREET4, T3FREE, THYROIDAB in the last 72 hours. Anemia Panel: No results for input(s): VITAMINB12, FOLATE, FERRITIN, TIBC, IRON, RETICCTPCT in the last 72 hours. Urine analysis:    Component Value Date/Time   COLORURINE STRAW (A) 05/06/2017 2245   APPEARANCEUR CLEAR 05/06/2017 2245   LABSPEC 1.009 05/06/2017 2245   PHURINE 6.0 05/06/2017 2245   GLUCOSEU NEGATIVE 05/06/2017 2245   GLUCOSEU NEGATIVE 10/16/2015 1436   HGBUR NEGATIVE 05/06/2017 2245   BILIRUBINUR NEGATIVE 05/06/2017 2245   KETONESUR NEGATIVE 05/06/2017 2245   PROTEINUR NEGATIVE 05/06/2017 2245   UROBILINOGEN 0.2 10/16/2015 1436   NITRITE NEGATIVE 05/06/2017 2245   LEUKOCYTESUR NEGATIVE 05/06/2017 2245   Sepsis  Labs: _0 (procalcitonin:4,lacticidven:4) )No results found for this or any previous visit (from the past 240 hour(s)).   Radiological Exams on Admission: Dg Chest 2 View  Result Date: 06/03/2017 CLINICAL DATA:  Wheezing and shortness of breath for 1 week. Bilateral leg swelling and weight gain over 2 weeks. EXAM: CHEST  2 VIEW COMPARISON:  05/06/2017 FINDINGS: Shallow inspiration. Mild cardiac enlargement and pulmonary vascular congestion. Developing fine interstitial pattern suggest early developing interstitial edema. Calcification of the aorta. No pneumothorax. IMPRESSION: Cardiac enlargement with mild pulmonary vascular congestion and developing interstitial edema. Electronically Signed   By: Lucienne Capers M.D.   On: 06/03/2017 19:12      Assessment/Plan Active Problems:   Hypothyroidism   Essential hypertension   Chronic kidney disease (CKD), stage IV (severe) (HCC)   Acute diastolic CHF (congestive heart failure) (HCC)   CHF (congestive heart failure) (HCC)   DM (diabetes mellitus), type 2 with renal complications ([place])    1. Acute on chronic diastolic CHF last EF measured in March 2018 was 55-60% with grade 1 diastolic dysfunction -patient placed on Lasix 80 mg IV every 12.  Closely follow intake output metabolic panel and daily weights.  EKG and troponin are pending. 2. Chronic kidney disease stage IV with worsening renal function -closely follow metabolic panel anticipate renal function may further worsen. 3. Diabetes mellitus type 2 on Januvia which will be continued with sliding scale coverage. 4. Chronic anemia likely from chronic kidney disease -follow CBC. 5. Hypothyroidism on Synthroid. 6. Hyperlipidemia on statins.   DVT prophylaxis: Heparin. Code Status: Full code. Family Communication: Discussed with patient. Disposition Plan: Home. Consults called: None. Admission status: Inpatient.   Rise Patience MD [facility] Pager [phone].  If 7PM-7AM, please contact night-coverage [email or web address] Password [email or web address]  06/04/2017, 1:19 AM

## 2017-06-04 NOTE — ED Notes (Signed)
Phenecia is granddtr at bedside and her contact info is [phone]; please call when room is assigned

## 2017-06-04 NOTE — Progress Notes (Signed)
PROGRESS NOTE    Michelle KnudsenMamie W Zwick  ZOX:[id number] DOB: [birth year] DOA: 06/03/2017 PCP: [name], [name], MD    Brief Narrative: Michelle Morton is a 84 y.o. female with history of chronic diastolic CHF last EF measured in March 2018 was 55-60% with grade 1 diastolic dysfunction was referred to the ER by patient's primary care physician as patient was found to have increasing lower extremity edema and shortness of breath.  Patient was recently admitted in December last month for similar complaints and was placed on diuresis following which patient improved.  At discharge patient's weight was around 168 pounds.  Presently on admission is 182 pounds.  Patient states she has been compliant with her medications.  Denies any chest pain or fever chills.  ED Course: In the ER on exam patient has lower extremity edema extending up to the thighs.  Chest x-ray was compatible with pulmonary edema.  Patient was given Lasix 80 mg IV and admitted for further management of CHF.  Patient's creatinine has increased from baseline.     Assessment & Plan:   Active Problems:   Hypothyroidism   Essential hypertension   Chronic kidney disease (CKD), stage IV (severe) (HCC)   Acute diastolic CHF (congestive heart failure) (HCC)   CHF (congestive heart failure) (HCC)   DM (diabetes mellitus), type 2 with renal complications (HCC)   1-Acute on chronic diastolic HF;  Continue with IV lasix 80 mg IV BID.  Urine out put 500 in canister.  Strict I and O.  Daily weight.  Weight last discharge 168 pounds.  Nephrology consulted.   2-Acute on CKD stage IV;  Last cr 2.5 three weeks ago.  Cr on admission 2.9.  Monitor on lasix.  Nephrology consulted.   3-DM;  SSI;   Anemia of chronic diseases;  Monitor hb.   Hypothyroidism; continue with synthroid.   Hyperlipidemia; continue with statins.     DVT prophylaxis: Heparin.  Code Status: full code.  Family Communication:  Disposition Plan: to be  determine  Consultants:     Procedures:   none   Antimicrobials:   none   Subjective: She is feeling better, breathing better, denies diet indiscretion.   Objective: Vitals:   06/04/17 0415 06/04/17 0645 06/04/17 0700 06/04/17 0745  BP: (!) 114/55 (!) 104/52 107/65 (!) 102/58  Pulse: 75 68 73 77  Resp: 12 12 12 14   Temp:      TempSrc:      SpO2: 100% 100% 100% 93%  Weight:      Height:        Intake/Output Summary (Last 24 hours) at 06/04/2017 0848 Last data filed at 06/04/2017 0610 Gross per 24 hour  Intake -  Output 250 ml  Net -250 ml   Filed Weights   06/03/17 1809  Weight: 82.6 kg (182 lb)    Examination:  General exam: Appears calm and comfortable  Respiratory system: Bilateral crackles.  Cardiovascular system: S1 & S2 heard, RRR. Trace LE edema Gastrointestinal system: Abdomen is nondistended, soft and nontender. No organomegaly or masses felt. Normal bowel sounds heard. Central nervous system: Alert and oriented. No focal neurological deficits. Extremities: Symmetric 5 x 5 power. Skin: No rashes, lesions or ulcers   Data Reviewed: I have personally reviewed following labs and imaging studies  CBC: Recent Labs  Lab 06/03/17 1824  WBC 6.3  NEUTROABS 4.2  HGB 10.4*  HCT 32.2*  MCV 92.0  PLT 269   Basic Metabolic Panel: Recent Labs  Lab  06/03/17 1824 06/04/17 0118  NA 134*  --   K 4.5  --   CL 102  --   CO2 21*  --   GLUCOSE 225*  --   BUN 144*  --   CREATININE 2.96*  --   CALCIUM 9.2  --   MG  --  2.1   GFR: Estimated Creatinine Clearance: 15.6 mL/min (A) (by C-G formula based on SCr of 2.96 mg/dL (H)). Liver Function Tests: Recent Labs  Lab 06/03/17 1824  AST 19  ALT 16  ALKPHOS 52  BILITOT 0.6  PROT 7.8  ALBUMIN 3.7   No results for input(s): LIPASE, AMYLASE in the last 168 hours. No results for input(s): AMMONIA in the last 168 hours. Coagulation Profile: No results for input(s): INR, PROTIME in the last 168  hours. Cardiac Enzymes: Recent Labs  Lab 06/04/17 0637  TROPONINI <0.03   BNP (last 3 results) No results for input(s): PROBNP in the last 8760 hours. HbA1C: No results for input(s): HGBA1C in the last 72 hours. CBG: No results for input(s): GLUCAP in the last 168 hours. Lipid Profile: No results for input(s): CHOL, HDL, LDLCALC, TRIG, CHOLHDL, LDLDIRECT in the last 72 hours. Thyroid Function Tests: Recent Labs    06/04/17 0450  TSH 3.076   Anemia Panel: No results for input(s): VITAMINB12, FOLATE, FERRITIN, TIBC, IRON, RETICCTPCT in the last 72 hours. Sepsis Labs: No results for input(s): PROCALCITON, LATICACIDVEN in the last 168 hours.  No results found for this or any previous visit (from the past 240 hour(s)).       Radiology Studies: Dg Chest 2 View  Result Date: 06/03/2017 CLINICAL DATA:  Wheezing and shortness of breath for 1 week. Bilateral leg swelling and weight gain over 2 weeks. EXAM: CHEST  2 VIEW COMPARISON:  05/06/2017 FINDINGS: Shallow inspiration. Mild cardiac enlargement and pulmonary vascular congestion. Developing fine interstitial pattern suggest early developing interstitial edema. Calcification of the aorta. No pneumothorax. IMPRESSION: Cardiac enlargement with mild pulmonary vascular congestion and developing interstitial edema. Electronically Signed   By: Burman Nieves M.D.   On: 06/03/2017 19:12        Scheduled Meds: . furosemide  80 mg Intravenous BID  . gabapentin  200 mg Oral QHS  . heparin  5,000 Units Subcutaneous Q8H  . insulin aspart  0-9 Units Subcutaneous TID WC  . levothyroxine  88 mcg Oral QAC breakfast  . linagliptin  5 mg Oral Daily  . pravastatin  40 mg Oral q1800   Continuous Infusions:   LOS: 0 days    Time spent: 35 minutes,     Alba Cory, MD [facility] Pager [phone]  If 7PM-7AM, please contact night-coverage [email or web address] Password [facility] 06/04/2017, 8:48 AM

## 2017-06-05 DIAGNOSIS — I509 Heart failure, unspecified: Secondary | ICD-10-CM

## 2017-06-05 LAB — GLUCOSE, CAPILLARY
GLUCOSE-CAPILLARY: 115 mg/dL — AB (ref 65–99)
GLUCOSE-CAPILLARY: 193 mg/dL — AB (ref 65–99)
GLUCOSE-CAPILLARY: 262 mg/dL — AB (ref 65–99)
Glucose-Capillary: 144 mg/dL — ABNORMAL HIGH (ref 65–99)

## 2017-06-05 LAB — BASIC METABOLIC PANEL
Anion gap: 13 (ref 5–15)
BUN: 147 mg/dL — AB (ref 6–20)
CO2: 23 mmol/L (ref 22–32)
CREATININE: 2.7 mg/dL — AB (ref 0.44–1.00)
Calcium: 9 mg/dL (ref 8.9–10.3)
Chloride: 99 mmol/L — ABNORMAL LOW (ref 101–111)
GFR, EST AFRICAN AMERICAN: 18 mL/min — AB (ref >60)
GFR, EST NON AFRICAN AMERICAN: 15 mL/min — AB (ref >60)
Glucose, Bld: 120 mg/dL — ABNORMAL HIGH (ref 65–99)
POTASSIUM: 4.1 mmol/L (ref 3.5–5.1)
Sodium: 135 mmol/L (ref 135–145)

## 2017-06-05 MED ORDER — FUROSEMIDE 10 MG/ML IJ SOLN
80.0000 mg | Freq: Two times a day (BID) | INTRAMUSCULAR | Status: DC
  Administered 2017-06-05 – 2017-06-07 (×4): 80 mg via INTRAVENOUS
  Filled 2017-06-05 (×4): qty 8

## 2017-06-05 MED ORDER — SENNOSIDES-DOCUSATE SODIUM 8.6-50 MG PO TABS
1.0000 | ORAL_TABLET | Freq: Two times a day (BID) | ORAL | Status: DC
  Administered 2017-06-05 – 2017-06-08 (×5): 1 via ORAL
  Filled 2017-06-05 (×6): qty 1

## 2017-06-05 NOTE — Progress Notes (Addendum)
PROGRESS NOTE    Michelle KnudsenMamie W Morton  [id number] DOB: 1932-05-25 DOA: 06/03/2017 PCP: [name], [name], MD    Brief Narrative: Michelle Morton is a 84 y.o. female with history of chronic diastolic CHF last EF measured in March 2018 was 55-60% with grade 1 diastolic dysfunction was referred to the ER by patient's primary care physician as patient was found to have increasing lower extremity edema and shortness of breath.  Patient was recently admitted in December last month for similar complaints and was placed on diuresis following which patient improved.  At discharge patient's weight was around 168 pounds.  Presently on admission is 182 pounds.  Patient states she has been compliant with her medications.  Denies any chest pain or fever chills.  ED Course: In the ER on exam patient has lower extremity edema extending up to the thighs.  Chest x-ray was compatible with pulmonary edema.  Patient was given Lasix 80 mg IV and admitted for further management of CHF.  Patient's creatinine has increased from baseline.   Assessment & Plan:   Active Problems:   Hypothyroidism   Essential hypertension   Chronic kidney disease (CKD), stage IV (severe) (HCC)   Acute diastolic CHF (congestive heart failure) (HCC)   CHF (congestive heart failure) (HCC)   DM (diabetes mellitus), type 2 with renal complications (HCC)   1-Acute on chronic diastolic HF;  Continue with IV lasix 80 mg IV TID.  Urine out put 500 in canister.  Strict I and O. Negative 3 L.  Daily weight. 171 today Weight last discharge 168 pounds.  Nephrology consulted. Appreciate help.   2-Acute on CKD stage IV;  Last cr 2.5 three weeks ago.  Cr on admission 2.9.  Monitor on lasix.  Nephrology consulted.  Renal function improving.   3-DM;  SSI;   Anemia of chronic diseases;  Monitor hb.   Hypothyroidism; continue with synthroid.   Hyperlipidemia; continue with statins.     DVT prophylaxis: Heparin.  Code Status: full code.    Family Communication:  Disposition Plan: to be determine  Consultants:     Procedures:   none   Antimicrobials:   none   Subjective: Dyspnea improved. Not at baseline.  Happy that her weight is down   Objective: Vitals:   06/04/17 1515 06/04/17 1539 06/04/17 2115 06/05/17 0534  BP: 126/66 126/62 (!) 105/50 (!) 115/53  Pulse: 86 73 83 78  Resp: 15 20 18 18   Temp:  (!) 97.4 F (36.3 C) 97.7 F (36.5 C) 97.8 F (36.6 C)  TempSrc:  Oral Oral Oral  SpO2: 100% 100% 100% 97%  Weight:  79.8 kg (175 lb 14.8 oz)  77.6 kg (171 lb)  Height:  5\' 3"  (1.6 m)      Intake/Output Summary (Last 24 hours) at 06/05/2017 0925 Last data filed at 06/05/2017 0914 Gross per 24 hour  Intake 720 ml  Output 3551 ml  Net -2831 ml   Filed Weights   06/03/17 1809 06/04/17 1539 06/05/17 0534  Weight: 82.6 kg (182 lb) 79.8 kg (175 lb 14.8 oz) 77.6 kg (171 lb)    Examination:  General exam: NAD Respiratory system: Bilateral crackles, normal respiratory effort.  Cardiovascular system: S 1, S 2 RRR Gastrointestinal system: BS present, soft, nt Central nervous system: non focal. Extremities: symmetric power. Bilateral edema  Skin: No rashes, lesions or ulcers   Data Reviewed: I have personally reviewed following labs and imaging studies  CBC: Recent Labs  Lab 06/03/17 1824  WBC 6.3  NEUTROABS 4.2  HGB 10.4*  HCT 32.2*  MCV 92.0  PLT 269   Basic Metabolic Panel: Recent Labs  Lab 06/03/17 1824 06/04/17 0118 06/05/17 0448  NA 134*  --  135  K 4.5  --  4.1  CL 102  --  99*  CO2 21*  --  23  GLUCOSE 225*  --  120*  BUN 144*  --  147*  CREATININE 2.96*  --  2.70*  CALCIUM 9.2  --  9.0  MG  --  2.1  --    GFR: Estimated Creatinine Clearance: 15.6 mL/min (A) (by C-G formula based on SCr of 2.7 mg/dL (H)). Liver Function Tests: Recent Labs  Lab 06/03/17 1824  AST 19  ALT 16  ALKPHOS 52  BILITOT 0.6  PROT 7.8  ALBUMIN 3.7   No results for input(s): LIPASE, AMYLASE  in the last 168 hours. No results for input(s): AMMONIA in the last 168 hours. Coagulation Profile: No results for input(s): INR, PROTIME in the last 168 hours. Cardiac Enzymes: Recent Labs  Lab 06/04/17 0637  TROPONINI <0.03   BNP (last 3 results) No results for input(s): PROBNP in the last 8760 hours. HbA1C: No results for input(s): HGBA1C in the last 72 hours. CBG: Recent Labs  Lab 06/04/17 0925 06/04/17 1217 06/04/17 1616 06/04/17 2118 06/05/17 0724  GLUCAP 102* 131* 175* 95 115*   Lipid Profile: No results for input(s): CHOL, HDL, LDLCALC, TRIG, CHOLHDL, LDLDIRECT in the last 72 hours. Thyroid Function Tests: Recent Labs    06/04/17 0450  TSH 3.076   Anemia Panel: No results for input(s): VITAMINB12, FOLATE, FERRITIN, TIBC, IRON, RETICCTPCT in the last 72 hours. Sepsis Labs: No results for input(s): PROCALCITON, LATICACIDVEN in the last 168 hours.  No results found for this or any previous visit (from the past 240 hour(s)).       Radiology Studies: Dg Chest 2 View  Result Date: 06/03/2017 CLINICAL DATA:  Wheezing and shortness of breath for 1 week. Bilateral leg swelling and weight gain over 2 weeks. EXAM: CHEST  2 VIEW COMPARISON:  05/06/2017 FINDINGS: Shallow inspiration. Mild cardiac enlargement and pulmonary vascular congestion. Developing fine interstitial pattern suggest early developing interstitial edema. Calcification of the aorta. No pneumothorax. IMPRESSION: Cardiac enlargement with mild pulmonary vascular congestion and developing interstitial edema. Electronically Signed   By: Burman Nieves M.D.   On: 06/03/2017 19:12        Scheduled Meds: . furosemide  80 mg Intravenous TID  . gabapentin  200 mg Oral QHS  . heparin  5,000 Units Subcutaneous Q8H  . insulin aspart  0-9 Units Subcutaneous TID WC  . levothyroxine  88 mcg Oral QAC breakfast  . linagliptin  5 mg Oral Daily  . pravastatin  40 mg Oral q1800  . senna-docusate  1 tablet Oral  BID   Continuous Infusions:   LOS: 1 day    Time spent: 35 minutes,     Alba Cory, MD [facility] Pager [phone]  If 7PM-7AM, please contact night-coverage [email or web address] Password [email or web address] 06/05/2017, 9:25 AM

## 2017-06-05 NOTE — Plan of Care (Signed)
  Nutrition: Adequate nutrition will be maintained 06/05/2017 0750 - Completed/Met by Evert Kohl, RN

## 2017-06-05 NOTE — Plan of Care (Signed)
  Education: Knowledge of General Education information will improve 06/05/2017 1037 - Progressing by Chaya Janunn, Jodene Polyak K, RN

## 2017-06-05 NOTE — Progress Notes (Signed)
Admit: 06/03/2017 LOS: 1  30F with AoC dCHF exacerbation and AoCKD4 with significant azotemia  Subjective:  SCr slightly improved to 2.7 this AM, K 4.1; BUN Stable 3.6L UOP! Weight down 11lb Still some dyspnea with exertion Was LH with bearing down for BM This AM; resolved   01/05 0701 - 01/06 0700 In: 480 [P.O.:480] Out: 3551 [Urine:3550; Stool:1]  Filed Weights   06/03/17 1809 06/04/17 1539 06/05/17 0534  Weight: 82.6 kg (182 lb) 79.8 kg (175 lb 14.8 oz) 77.6 kg (171 lb)    Scheduled Meds: . furosemide  80 mg Intravenous TID  . gabapentin  200 mg Oral QHS  . heparin  5,000 Units Subcutaneous Q8H  . insulin aspart  0-9 Units Subcutaneous TID WC  . levothyroxine  88 mcg Oral QAC breakfast  . linagliptin  5 mg Oral Daily  . pravastatin  40 mg Oral q1800  . senna-docusate  1 tablet Oral BID   Continuous Infusions: PRN Meds:.acetaminophen **OR** acetaminophen, ondansetron **OR** ondansetron (ZOFRAN) IV  Current Labs: reviewed    Physical Exam:  Blood pressure (!) 115/53, pulse 78, temperature 97.8 F (36.6 C), temperature source Oral, resp. rate 18, height 5\' 3"  (1.6 m), weight 77.6 kg (171 lb), SpO2 97 %. NAD, pleasant NCAT EOMI imiproved aeration in bases RRR nl s1s2 trace LEE Nonfocal  A 1. AoCKD4 with significant azotemia 2. AoC dCHF exacerbation; excelletn response to diuretics 3. HTN, stable 4. Anemia  P 1. Reduce diuretic frequency to BID 2. Home diuretic dosing will need ot be increased, probably furosemide 80mg  PO BID 3. Will give information about PD today   Sabra Heckyan Sanford MD 06/05/2017, 11:10 AM  Recent Labs  Lab 06/03/17 1824 06/05/17 0448  NA 134* 135  K 4.5 4.1  CL 102 99*  CO2 21* 23  GLUCOSE 225* 120*  BUN 144* 147*  CREATININE 2.96* 2.70*  CALCIUM 9.2 9.0   Recent Labs  Lab 06/03/17 1824  WBC 6.3  NEUTROABS 4.2  HGB 10.4*  HCT 32.2*  MCV 92.0  PLT 269

## 2017-06-06 LAB — BASIC METABOLIC PANEL
ANION GAP: 11 (ref 5–15)
BUN: 156 mg/dL — AB (ref 6–20)
CALCIUM: 8.6 mg/dL — AB (ref 8.9–10.3)
CO2: 25 mmol/L (ref 22–32)
Chloride: 102 mmol/L (ref 101–111)
Creatinine, Ser: 2.71 mg/dL — ABNORMAL HIGH (ref 0.44–1.00)
GFR calc Af Amer: 18 mL/min — ABNORMAL LOW (ref >60)
GFR calc non Af Amer: 15 mL/min — ABNORMAL LOW (ref >60)
Glucose, Bld: 82 mg/dL (ref 65–99)
Potassium: 4.2 mmol/L (ref 3.5–5.1)
Sodium: 138 mmol/L (ref 135–145)

## 2017-06-06 LAB — GLUCOSE, CAPILLARY
GLUCOSE-CAPILLARY: 141 mg/dL — AB (ref 65–99)
Glucose-Capillary: 85 mg/dL (ref 65–99)
Glucose-Capillary: 143 mg/dL — ABNORMAL HIGH (ref 65–99)
Glucose-Capillary: 152 mg/dL — ABNORMAL HIGH (ref 65–99)

## 2017-06-06 MED ORDER — DICLOFENAC SODIUM 1 % TD GEL
4.0000 g | Freq: Four times a day (QID) | TRANSDERMAL | Status: DC | PRN
  Administered 2017-06-06 – 2017-06-07 (×2): 4 g via TOPICAL
  Filled 2017-06-06: qty 100

## 2017-06-06 MED ORDER — LORATADINE 10 MG PO TABS
10.0000 mg | ORAL_TABLET | Freq: Every day | ORAL | Status: DC
  Administered 2017-06-06 – 2017-06-08 (×3): 10 mg via ORAL
  Filled 2017-06-06 (×3): qty 1

## 2017-06-06 MED ORDER — AMIODARONE HCL 100 MG PO TABS
100.0000 mg | ORAL_TABLET | Freq: Every day | ORAL | Status: DC
  Administered 2017-06-06 – 2017-06-07 (×2): 100 mg via ORAL
  Filled 2017-06-06 (×2): qty 1

## 2017-06-06 NOTE — Plan of Care (Signed)
  Health Behavior/Discharge Planning: Ability to manage health-related needs will improve 06/06/2017 0251 - Adequate for Discharge by Jeanella Flatteryhomas, Yakub Lodes T, RN

## 2017-06-06 NOTE — Evaluation (Signed)
Physical Therapy Evaluation Patient Details Name: Michelle KnudsenMamie W Morton MRN: [id number] DOB: [birth year] Today's Date: 06/06/2017   History of Present Illness  Ptis an [age] y/ofemalewith CKD IV secondary to DM and HTN, Also used NSAIDs for years, stopped in 2016.[facility] with CHF exacerbation/acute on chronic RF. Admitted currently with c/o worsening dyspnea and lower extremity swelling. Noticed worsening DOE this week with ankles swelling. CXR showing vascular congestion/edema.She still feels SOB with exertion, got winded walking to bathroom in ED.   Clinical Impression  Pt admitted with above diagnosis. Pt currently with functional limitations due to the deficits listed below (see PT Problem List). At the time of PT eval pt was able to perform transfers and ambulation with gross min guard assist for balance support and safety with the RW. Pt with overall decreased safety awareness and will benefit from continued RW use at this time. Pt reports she has had HHPT services coming out to the house - recommend HHPT resume at d/c to maximize functional independence in the home environment. Acutely, pt will benefit from skilled PT to increase their independence and safety with mobility to allow discharge to the venue listed below.     Follow Up Recommendations Home health PT;Supervision for mobility/OOB    Equipment Recommendations  None recommended by PT    Recommendations for Other Services       Precautions / Restrictions Precautions Precautions: Fall Restrictions Weight Bearing Restrictions: No      Mobility  Bed Mobility               General bed mobility comments: Pt received ambulating in room with NT present.   Transfers Overall transfer level: Needs assistance Equipment used: Rolling walker (2 wheeled) Transfers: Sit to/from Stand Sit to Stand: Min guard         General transfer comment: Close guard for safety as pt powered-up to full standing position.    Ambulation/Gait Ambulation/Gait assistance: Min guard;Min assist Ambulation Distance (Feet): 150 Feet Assistive device: Rolling walker (2 wheeled) Gait Pattern/deviations: Step-through pattern;Decreased stride length;Trunk flexed Gait velocity: Decreased Gait velocity interpretation: Below normal speed for age/gender General Gait Details: VC's for improved posture and closer walker proximity. Occasional assist provided for walker management, especially with turns.  Stairs            Wheelchair Mobility    Modified Rankin (Stroke Patients Only)       Balance Overall balance assessment: Needs assistance Sitting-balance support: Feet supported;No upper extremity supported Sitting balance-Leahy Scale: Fair     Standing balance support: No upper extremity supported Standing balance-Leahy Scale: Poor Standing balance comment: Trunk flexed and pt reaching for outside support when attempting to mobilize without the RW in the room.                              Pertinent Vitals/Pain Pain Assessment: No/denies pain    Home Living Family/patient expects to be discharged to:: Private residence Living Arrangements: Children Available Help at Discharge: Family;Available PRN/intermittently Type of Home: House Home Access: Level entry     Home Layout: Two level Home Equipment: Walker - 4 wheels;Walker - 2 wheels;Bedside commode Additional Comments: keeps one walker upstairs and one downstairs    Prior Function Level of Independence: Independent with assistive device(s)         Comments: amb with walker in home; dtr helps with meals, household tasks     Hand Dominance  Extremity/Trunk Assessment   Upper Extremity Assessment Upper Extremity Assessment: Defer to OT evaluation    Lower Extremity Assessment Lower Extremity Assessment: Generalized weakness    Cervical / Trunk Assessment Cervical / Trunk Assessment: Kyphotic  Communication    Communication: No difficulties  Cognition Arousal/Alertness: Awake/alert Behavior During Therapy: WFL for tasks assessed/performed Overall Cognitive Status: Within Functional Limits for tasks assessed                                        General Comments      Exercises     Assessment/Plan    PT Assessment Patient needs continued PT services  PT Problem List Decreased strength;Decreased range of motion;Decreased activity tolerance;Decreased balance;Decreased mobility;Decreased knowledge of use of DME;Decreased safety awareness;Decreased knowledge of precautions;Pain       PT Treatment Interventions DME instruction;Gait training;Stair training;Functional mobility training;Therapeutic activities;Therapeutic exercise;Neuromuscular re-education;Patient/family education    PT Goals (Current goals can be found in the Care Plan section)  Acute Rehab PT Goals PT Goal Formulation: With patient Time For Goal Achievement: 06/13/17 Potential to Achieve Goals: Good    Frequency Min 3X/week   Barriers to discharge Decreased caregiver support Alone at times    Co-evaluation               AM-PAC PT "6 Clicks" Daily Activity  Outcome Measure Difficulty turning over in bed (including adjusting bedclothes, sheets and blankets)?: None Difficulty moving from lying on back to sitting on the side of the bed? : A Little Difficulty sitting down on and standing up from a chair with arms (e.g., wheelchair, bedside commode, etc,.)?: A Little Help needed moving to and from a bed to chair (including a wheelchair)?: A Little Help needed walking in hospital room?: A Little Help needed climbing 3-5 steps with a railing? : A Little 6 Click Score: 19    End of Session Equipment Utilized During Treatment: Gait belt Activity Tolerance: Patient tolerated treatment well Patient left: in chair;with call bell/phone within reach Nurse Communication: Mobility status PT Visit Diagnosis:  Unsteadiness on feet (R26.81);Muscle weakness (generalized) (M62.81)    Time: 1610-9604 PT Time Calculation (min) (ACUTE ONLY): 25 min   Charges:   PT Evaluation $PT Eval Moderate Complexity: 1 Mod PT Treatments $Gait Training: 8-22 mins   PT G Codes:        Conni Slipper, PT, DPT Acute Rehabilitation Services Pager: [phone]   Marylynn Pearson 06/06/2017, 1:33 PM

## 2017-06-06 NOTE — Progress Notes (Signed)
PT Cancellation Note  Patient Details Name: Michelle KnudsenMamie W Morton MRN: [id number] DOB: 19-Nov-1931   Cancelled Treatment:    Reason Eval/Treat Not Completed: Checked on pt x2 this morning. Pt initially declined as she wanted to wait for breakfast, and was occupied in the room with other staff upon second attempt. Will check back as schedule allows.    Marylynn PearsonLaura D Kristin Barcus 06/06/2017, 10:17 AM   Conni SlipperLaura Jayleena Stille, PT, DPT Acute Rehabilitation Services Pager: [phone]

## 2017-06-06 NOTE — Evaluation (Signed)
Occupational Therapy Evaluation Patient Details Name: Michelle Morton MRN: [id number] DOB: March 21, 1932 Today's Date: 06/06/2017    History of Present Illness Ptis an 32 y/ofemalewith CKD IV secondary to DM and HTN, Also used NSAIDs for years, stopped in 2016.Recent [facility] admit 12/7-12/12 with CHF exacerbation/acute on chronic RF. Admitted currently with c/o worsening dyspnea and lower extremity swelling. Noticed worsening DOE this week with ankles swelling. CXR showing vascular congestion/edema.She still feels SOB with exertion, got winded walking to bathroom in ED.    Clinical Impression   Pt is ta set/sup with ADLs and min guard A with ADL mobility. Pt will have some assist from at home from family prn. All education completed and no further acute OT is indicated at this time. Pt to continue with acute PT services for functional mobility safety    Follow Up Recommendations  No OT follow up;Supervision - Intermittent(sup with showers/selfcare)    Equipment Recommendations  None recommended by OT    Recommendations for Other Services       Precautions / Restrictions Precautions Precautions: Fall Restrictions Weight Bearing Restrictions: No      Mobility Bed Mobility               General bed mobility comments: pt seated at sink upon OT arrival  Transfers Overall transfer level: Needs assistance Equipment used: Rolling walker (2 wheeled) Transfers: Sit to/from Stand Sit to Stand: Min guard         General transfer comment: Close guard for safety as pt powered-up to full standing position.     Balance Overall balance assessment: Needs assistance Sitting-balance support: Feet supported;No upper extremity supported Sitting balance-Leahy Scale: Fair     Standing balance support: No upper extremity supported;During functional activity Standing balance-Leahy Scale: Poor Standing balance comment: Trunk flexed and pt reaching for outside support when attempting to  mobilize without the RW in the room.                            ADL either performed or assessed with clinical judgement   ADL Overall ADL's : Needs assistance/impaired Eating/Feeding: Independent;Sitting   Grooming: Wash/dry face;Wash/dry hands;Standing;Supervision/safety;Set up   Upper Body Bathing: Set up;Sitting   Lower Body Bathing: Min guard;Sit to/from stand   Upper Body Dressing : Set up;Sitting   Lower Body Dressing: Sit to/from stand;Min guard   Toilet Transfer: Min guard;Ambulation;RW;Comfort height toilet;Grab bars   Toileting- Clothing Manipulation and Hygiene: Min guard;Sit to/from stand   Tub/ Shower Transfer: Min guard;Ambulation;Rolling walker;3 in 1   Functional mobility during ADLs: Min guard;Cueing for safety General ADL Comments: assist with LB consisted of helping thread underwear over feet and to donn socks     Vision Baseline Vision/History: Wears glasses Wears Glasses: Reading only Patient Visual Report: No change from baseline       Perception     Praxis      Pertinent Vitals/Pain Pain Assessment: No/denies pain     Hand Dominance Right   Extremity/Trunk Assessment Upper Extremity Assessment Upper Extremity Assessment: Overall WFL for tasks assessed   Lower Extremity Assessment Lower Extremity Assessment: Defer to PT evaluation   Cervical / Trunk Assessment Cervical / Trunk Assessment: Kyphotic   Communication Communication Communication: No difficulties   Cognition Arousal/Alertness: Awake/alert Behavior During Therapy: WFL for tasks assessed/performed Overall Cognitive Status: Within Functional Limits for tasks assessed  General Comments       Exercises     Shoulder Instructions      Home Living Family/patient expects to be discharged to:: Private residence Living Arrangements: Children Available Help at Discharge: Family;Available PRN/intermittently Type  of Home: House Home Access: Level entry     Home Layout: Two level Alternate Level Stairs-Number of Steps: 14--has chair glide   Bathroom Shower/Tub: Chief Strategy OfficerTub/shower unit   Bathroom Toilet: Standard     Home Equipment: Environmental consultantWalker - 4 wheels;Walker - 2 wheels;Bedside commode   Additional Comments: keeps one walker upstairs and one downstairs      Prior Functioning/Environment Level of Independence: Independent with assistive device(s)        Comments: amb with walker in home; dtr helps with meals, household tasks. Independent with ADLs/selfcare        OT Problem List: Decreased activity tolerance;Impaired balance (sitting and/or standing)      OT Treatment/Interventions:      OT Goals(Current goals can be found in the care plan section) Acute Rehab OT Goals Patient Stated Goal: go home today OT Goal Formulation: With patient/family  OT Frequency:     Barriers to D/C:    no barriers       Co-evaluation              AM-PAC PT "6 Clicks" Daily Activity     Outcome Measure Help from another person eating meals?: None Help from another person taking care of personal grooming?: A Little Help from another person toileting, which includes using toliet, bedpan, or urinal?: A Little Help from another person bathing (including washing, rinsing, drying)?: A Little Help from another person to put on and taking off regular upper body clothing?: None Help from another person to put on and taking off regular lower body clothing?: A Little 6 Click Score: 20   End of Session Equipment Utilized During Treatment: Rolling walker;Gait belt;Other (comment)(3 in 1)  Activity Tolerance: Patient tolerated treatment well Patient left: in bed;with call bell/phone within reach;with family/visitor present;with nursing/sitter in room(sitting EOB)  OT Visit Diagnosis: Unsteadiness on feet (R26.81);Muscle weakness (generalized) (M62.81)                Time: [id number] OT Time Calculation  (min): 25 min Charges:  OT General Charges $OT Visit: 1 Visit OT Evaluation $OT Eval Moderate Complexity: 1 Mod OT Treatments $Self Care/Home Management : 8-22 mins G-Codes: OT G-codes **NOT FOR INPATIENT CLASS** Functional Assessment Tool Used: AM-PAC 6 Clicks Daily Activity     Galen ManilaSpencer, Grady Lucci Jeanette 06/06/2017, 1:54 PM

## 2017-06-06 NOTE — Progress Notes (Signed)
[facility] ROUNDING NOTE   Subjective:    31F with AoC dCHF exacerbation and AoCKD4 with significant azotemia    Objective:  Vital signs in last 24 hours:  Temp:  [97.6 F (36.4 C)-98.6 F (37 C)] 97.9 F (36.6 C) (01/07 0700) Pulse Rate:  [71-87] 81 (01/07 0946) Resp:  [20] 20 (01/07 0700) BP: (99-121)/(44-61) 119/61 (01/07 0946) SpO2:  [99 %-100 %] 100 % (01/07 0946) Weight:  [171 lb 8 oz (77.8 kg)] 171 lb 8 oz (77.8 kg) (01/07 0700)  Weight change: -6.8 oz (-2.008 kg) Filed Weights   06/04/17 1539 06/05/17 0534 06/06/17 0700  Weight: 175 lb 14.8 oz (79.8 kg) 171 lb (77.6 kg) 171 lb 8 oz (77.8 kg)    Intake/Output: I/O last 3 completed shifts: In: 1450 [P.O.:1450] Out: 4151 [Urine:4150; Stool:1]   Intake/Output this shift:  Total I/O In: 240 [P.O.:240] Out: 200 [Urine:200]  NAD, pleasant NCAT EOMI imiproved aeration in bases RRR nl s1s2 trace LEE Nonfocal     Basic Metabolic Panel: Recent Labs  Lab 06/03/17 1824 06/04/17 0118 06/05/17 0448 06/06/17 0521  NA 134*  --  135 138  K 4.5  --  4.1 4.2  CL 102  --  99* 102  CO2 21*  --  23 25  GLUCOSE 225*  --  120* 82  BUN 144*  --  147* 156*  CREATININE 2.96*  --  2.70* 2.71*  CALCIUM 9.2  --  9.0 8.6*  MG  --  2.1  --   --     Liver Function Tests: Recent Labs  Lab 06/03/17 1824  AST 19  ALT 16  ALKPHOS 52  BILITOT 0.6  PROT 7.8  ALBUMIN 3.7   No results for input(s): LIPASE, AMYLASE in the last 168 hours. No results for input(s): AMMONIA in the last 168 hours.  CBC: Recent Labs  Lab 06/03/17 1824  WBC 6.3  NEUTROABS 4.2  HGB 10.4*  HCT 32.2*  MCV 92.0  PLT 269    Cardiac Enzymes: Recent Labs  Lab 06/04/17 0637  TROPONINI <0.03    BNP: Invalid input(s): POCBNP  CBG: Recent Labs  Lab 06/05/17 0724 06/05/17 1107 06/05/17 1646 06/05/17 2147 06/06/17 0746  GLUCAP 115* 193* 144* 262* 85    Microbiology: Results for orders placed or performed  during the hospital encounter of 09/24/15  Urine culture     Status: None   Collection Time: 09/24/15  3:42 PM  Result Value Ref Range Status   Specimen Description URINE, CLEAN CATCH  Final   Special Requests NONE  Final   Culture NO GROWTH 1 DAY  Final   Report Status 09/25/2015 FINAL  Final    Coagulation Studies: No results for input(s): LABPROT, INR in the last 72 hours.  Urinalysis: No results for input(s): COLORURINE, LABSPEC, PHURINE, GLUCOSEU, HGBUR, BILIRUBINUR, KETONESUR, PROTEINUR, UROBILINOGEN, NITRITE, LEUKOCYTESUR in the last 72 hours.  Invalid input(s): APPERANCEUR    Imaging: No results found.   Medications:    . furosemide  80 mg Intravenous BID  . gabapentin  200 mg Oral QHS  . heparin  5,000 Units Subcutaneous Q8H  . insulin aspart  0-9 Units Subcutaneous TID WC  . levothyroxine  88 mcg Oral QAC breakfast  . linagliptin  5 mg Oral Daily  . pravastatin  40 mg Oral q1800  . senna-docusate  1 tablet Oral BID   acetaminophen **OR** acetaminophen, ondansetron **OR** ondansetron (ZOFRAN) IV  Assessment/ Plan:   1. AoCKD4  with significant azotemia 2. AoC dCHF exacerbation; excelletn response to diuretics 3. HTN, stable 4. Anemia  Will need a frank conversation about dialysis versus palliative care. It may not be a bad idea to have palliative care involved   I will have Vein mapping done today   LOS: 2 Arthur Aydelotte W @TODAY @10 :25 AM

## 2017-06-06 NOTE — Progress Notes (Signed)
PROGRESS NOTE    Michelle KnudsenMamie W Morton  ZOX:[id number] DOB: Sep 07, 1931 DOA: 06/03/2017 PCP: [name], [name], MD    Brief Narrative: Michelle Morton is a 84 y.o. female with history of chronic diastolic CHF last EF measured in March 2018 was 55-60% with grade 1 diastolic dysfunction was referred to the ER by patient's primary care physician as patient was found to have increasing lower extremity edema and shortness of breath.  Patient was recently admitted in December last month for similar complaints and was placed on diuresis following which patient improved.  At discharge patient's weight was around 168 pounds.  Presently on admission is 182 pounds.  Patient states she has been compliant with her medications.  Denies any chest pain or fever chills.  ED Course: In the ER on exam patient has lower extremity edema extending up to the thighs.  Chest x-ray was compatible with pulmonary edema.  Patient was given Lasix 80 mg IV and admitted for further management of CHF.  Patient's creatinine has increased from baseline.   Assessment & Plan:   Active Problems:   Hypothyroidism   Essential hypertension   Chronic kidney disease (CKD), stage IV (severe) (HCC)   Acute diastolic CHF (congestive heart failure) (HCC)   CHF (congestive heart failure) (HCC)   DM (diabetes mellitus), type 2 with renal complications (HCC)   1-Acute on chronic diastolic HF;  Continue with IV lasix 80 mg IV BID  Urine out put 1.8 L Strict I and O. Negative 3 L.  Daily weight. 171 ---171 Weight last discharge 168 pounds.  Nephrology consulted. Appreciate help.   2-Acute on CKD stage IV;  Last cr 2.5 three weeks ago.  Cr on admission 2.9. --2.7 Monitor on lasix.  Nephrology consulted.  Renal function improving.  Patient decline HD. Nephrology recommend palliative for goals of care.   3-DM;  SSI;  Tradjenta.   Anemia of chronic diseases;  Monitor hb.   Hypothyroidism; continue with synthroid.   Hyperlipidemia; continue  with statins.     DVT prophylaxis: Heparin.  Code Status: DNR Family Communication: daughter over phone.  Disposition Plan: to be determine  Consultants:     Procedures:   none   Antimicrobials:   none   Subjective: She is breathing better, not at baseline. She decline HD. She does not wants to be resuscitated. Confirmed with daughter .    Objective: Vitals:   06/05/17 2211 06/06/17 0700 06/06/17 0946 06/06/17 1132  BP: (!) 121/44 (!) 119/53 119/61 108/85  Pulse: 87 71 81 76  Resp: 20 20  20   Temp: 98.6 F (37 C) 97.9 F (36.6 C)  97.8 F (36.6 C)  TempSrc: Oral Oral  Oral  SpO2: 100% 100% 100% 100%  Weight:  77.8 kg (171 lb 8 oz)    Height:        Intake/Output Summary (Last 24 hours) at 06/06/2017 1409 Last data filed at 06/06/2017 0955 Gross per 24 hour  Intake 820 ml  Output 1000 ml  Net -180 ml   Filed Weights   06/04/17 1539 06/05/17 0534 06/06/17 0700  Weight: 79.8 kg (175 lb 14.8 oz) 77.6 kg (171 lb) 77.8 kg (171 lb 8 oz)    Examination:  General exam: NAD Respiratory system: Normal  respiratory effort, bilateral crackles.  Cardiovascular system: S 1, S 2 rrr Gastrointestinal system: BS present, soft, nt Central nervous system:  Non focal.  Extremities: bilateral edema Skin: no rash   Data Reviewed: I have personally reviewed  following labs and imaging studies  CBC: Recent Labs  Lab 06/03/17 1824  WBC 6.3  NEUTROABS 4.2  HGB 10.4*  HCT 32.2*  MCV 92.0  PLT 269   Basic Metabolic Panel: Recent Labs  Lab 06/03/17 1824 06/04/17 0118 06/05/17 0448 06/06/17 0521  NA 134*  --  135 138  K 4.5  --  4.1 4.2  CL 102  --  99* 102  CO2 21*  --  23 25  GLUCOSE 225*  --  120* 82  BUN 144*  --  147* 156*  CREATININE 2.96*  --  2.70* 2.71*  CALCIUM 9.2  --  9.0 8.6*  MG  --  2.1  --   --    GFR: Estimated Creatinine Clearance: 15.5 mL/min (A) (by C-G formula based on SCr of 2.71 mg/dL (H)). Liver Function Tests: Recent Labs  Lab  06/03/17 1824  AST 19  ALT 16  ALKPHOS 52  BILITOT 0.6  PROT 7.8  ALBUMIN 3.7   No results for input(s): LIPASE, AMYLASE in the last 168 hours. No results for input(s): AMMONIA in the last 168 hours. Coagulation Profile: No results for input(s): INR, PROTIME in the last 168 hours. Cardiac Enzymes: Recent Labs  Lab 06/04/17 0637  TROPONINI <0.03   BNP (last 3 results) No results for input(s): PROBNP in the last 8760 hours. HbA1C: No results for input(s): HGBA1C in the last 72 hours. CBG: Recent Labs  Lab 06/05/17 1107 06/05/17 1646 06/05/17 2147 06/06/17 0746 06/06/17 1132  GLUCAP 193* 144* 262* 85 141*   Lipid Profile: No results for input(s): CHOL, HDL, LDLCALC, TRIG, CHOLHDL, LDLDIRECT in the last 72 hours. Thyroid Function Tests: Recent Labs    06/04/17 0450  TSH 3.076   Anemia Panel: No results for input(s): VITAMINB12, FOLATE, FERRITIN, TIBC, IRON, RETICCTPCT in the last 72 hours. Sepsis Labs: No results for input(s): PROCALCITON, LATICACIDVEN in the last 168 hours.  No results found for this or any previous visit (from the past 240 hour(s)).       Radiology Studies: No results found.      Scheduled Meds: . amiodarone  100 mg Oral Daily  . furosemide  80 mg Intravenous BID  . gabapentin  200 mg Oral QHS  . heparin  5,000 Units Subcutaneous Q8H  . insulin aspart  0-9 Units Subcutaneous TID WC  . levothyroxine  88 mcg Oral QAC breakfast  . linagliptin  5 mg Oral Daily  . loratadine  10 mg Oral Daily  . pravastatin  40 mg Oral q1800  . senna-docusate  1 tablet Oral BID   Continuous Infusions:   LOS: 2 days    Time spent: 35 minutes,     Alba Cory, MD [facility] Pager [phone]  If 7PM-7AM, please contact night-coverage [email or web address] Password [email or web address] 06/06/2017, 2:09 PM

## 2017-06-06 NOTE — Progress Notes (Signed)
Placed DNR band on patient.

## 2017-06-07 DIAGNOSIS — Z7189 Other specified counseling: Secondary | ICD-10-CM

## 2017-06-07 DIAGNOSIS — N184 Chronic kidney disease, stage 4 (severe): Secondary | ICD-10-CM

## 2017-06-07 DIAGNOSIS — Z515 Encounter for palliative care: Secondary | ICD-10-CM

## 2017-06-07 LAB — BASIC METABOLIC PANEL
Anion gap: 13 (ref 5–15)
BUN: 153 mg/dL — AB (ref 6–20)
CHLORIDE: 97 mmol/L — AB (ref 101–111)
CO2: 26 mmol/L (ref 22–32)
Calcium: 8.3 mg/dL — ABNORMAL LOW (ref 8.9–10.3)
Creatinine, Ser: 2.74 mg/dL — ABNORMAL HIGH (ref 0.44–1.00)
GFR calc Af Amer: 17 mL/min — ABNORMAL LOW (ref >60)
GFR calc non Af Amer: 15 mL/min — ABNORMAL LOW (ref >60)
GLUCOSE: 125 mg/dL — AB (ref 65–99)
POTASSIUM: 4.4 mmol/L (ref 3.5–5.1)
Sodium: 136 mmol/L (ref 135–145)

## 2017-06-07 LAB — GLUCOSE, CAPILLARY
GLUCOSE-CAPILLARY: 216 mg/dL — AB (ref 65–99)
Glucose-Capillary: 108 mg/dL — ABNORMAL HIGH (ref 65–99)
Glucose-Capillary: 98 mg/dL (ref 65–99)
Glucose-Capillary: 140 mg/dL — ABNORMAL HIGH (ref 65–99)

## 2017-06-07 MED ORDER — FUROSEMIDE 80 MG PO TABS
80.0000 mg | ORAL_TABLET | Freq: Two times a day (BID) | ORAL | Status: DC
  Administered 2017-06-07 – 2017-06-08 (×2): 80 mg via ORAL
  Filled 2017-06-07 (×2): qty 1

## 2017-06-07 NOTE — Plan of Care (Signed)
  Education: Knowledge of General Education information will improve 06/07/2017 0239 - Adequate for Discharge by Jeanella Flatteryhomas, Caitlyn Buchanan T, RN   Health Behavior/Discharge Planning: Ability to manage health-related needs will improve 06/07/2017 0239 - Adequate for Discharge by Jeanella Flatteryhomas, Arletta Lumadue T, RN   Cardiac: Ability to achieve and maintain adequate cardiopulmonary perfusion will improve 06/07/2017 0239 - Adequate for Discharge by Jeanella Flatteryhomas, Isauro Skelley T, RN

## 2017-06-07 NOTE — Progress Notes (Signed)
PROGRESS NOTE    [name]  ZDG:[id number] DOB: 10/10/1931 DOA: 06/03/2017 PCP: [name], MD    Brief Narrative: Michelle Morton is a 84 y.o. female with history of chronic diastolic CHF last EF measured in March 2018 was 55-60% with grade 1 diastolic dysfunction was referred to the ER by patient's primary care physician as patient was found to have increasing lower extremity edema and shortness of breath.  Patient was recently admitted in December last month for similar complaints and was placed on diuresis following which patient improved.  At discharge patient's weight was around 168 pounds.  Presently on admission is 182 pounds.  Patient states she has been compliant with her medications.  Denies any chest pain or fever chills.  ED Course: In the ER on exam patient has lower extremity edema extending up to the thighs.  Chest x-ray was compatible with pulmonary edema.  Patient was given Lasix 80 mg IV and admitted for further management of CHF.  Patient's creatinine has increased from baseline.   Assessment & Plan:   Active Problems:   Hypothyroidism   Essential hypertension   Chronic kidney disease (CKD), stage IV (severe) (HCC)   Acute diastolic CHF (congestive heart failure) (HCC)   CHF (congestive heart failure) (HCC)   DM (diabetes mellitus), type 2 with renal complications (HCC)   1-Acute on chronic diastolic HF;  Treated initially with IV lasix 80 Mg TID then BID.  Urine out put 1.4 L Strict I and O. Negative 4 L.  Daily weight. 171 ---171---168 Weight last discharge 168 pounds.  Nephrology consulted. Appreciate help.  Change to oral lasix 80 mg BID. Observed on oral lasix.   2-Acute on CKD stage IV;  Last cr 2.5 three weeks ago.  Cr on admission 2.9. --2.7--2.7 Monitor on lasix.  Nephrology consulted.  Patient decline HD. Nephrology recommend palliative for goals of care.  Stable, await palliative care meeting.   3-DM;  SSI;  Tradjenta.   Anemia of chronic  diseases;  Monitor hb.   Hypothyroidism; continue with synthroid.   Hyperlipidemia; continue with statins.     DVT prophylaxis: Heparin.  Code Status: DNR Family Communication: daughter over phone.  Disposition Plan: to be determine  Consultants:     Procedures:   none   Antimicrobials:   none   Subjective: She is feeling well, dyspnea improved.     Objective: Vitals:   06/06/17 0946 06/06/17 1132 06/06/17 2026 06/07/17 0650  BP: 119/61 108/85 94/62 (!) 110/58  Pulse: 81 76 77 75  Resp:  20 18   Temp:  97.8 F (36.6 C) 97.6 F (36.4 C) 98.3 F (36.8 C)  TempSrc:  Oral Oral Oral  SpO2: 100% 100% 100% 100%  Weight:    76.3 kg (168 lb 4.8 oz)  Height:        Intake/Output Summary (Last 24 hours) at 06/07/2017 1320 Last data filed at 06/07/2017 0843 Gross per 24 hour  Intake 860 ml  Output 1250 ml  Net -390 ml   Filed Weights   06/05/17 0534 06/06/17 0700 06/07/17 0650  Weight: 77.6 kg (171 lb) 77.8 kg (171 lb 8 oz) 76.3 kg (168 lb 4.8 oz)    Examination:  General exam: NAD Respiratory system: few crackles bases.  Cardiovascular system: S 1, S 2, RRR Gastrointestinal system: BS present, soft.  Central nervous system: non focal.  Extremities: trace edema Skin: no rash   Data Reviewed: I have personally reviewed following labs and  imaging studies  CBC: Recent Labs  Lab 06/03/17 1824  WBC 6.3  NEUTROABS 4.2  HGB 10.4*  HCT 32.2*  MCV 92.0  PLT 269   Basic Metabolic Panel: Recent Labs  Lab 06/03/17 1824 06/04/17 0118 06/05/17 0448 06/06/17 0521 06/07/17 0640  NA 134*  --  135 138 136  K 4.5  --  4.1 4.2 4.4  CL 102  --  99* 102 97*  CO2 21*  --  23 25 26   GLUCOSE 225*  --  120* 82 125*  BUN 144*  --  147* 156* 153*  CREATININE 2.96*  --  2.70* 2.71* 2.74*  CALCIUM 9.2  --  9.0 8.6* 8.3*  MG  --  2.1  --   --   --    GFR: Estimated Creatinine Clearance: 15.2 mL/min (A) (by C-G formula based on SCr of 2.74 mg/dL (H)). Liver  Function Tests: Recent Labs  Lab 06/03/17 1824  AST 19  ALT 16  ALKPHOS 52  BILITOT 0.6  PROT 7.8  ALBUMIN 3.7   No results for input(s): LIPASE, AMYLASE in the last 168 hours. No results for input(s): AMMONIA in the last 168 hours. Coagulation Profile: No results for input(s): INR, PROTIME in the last 168 hours. Cardiac Enzymes: Recent Labs  Lab 06/04/17 0637  TROPONINI <0.03   BNP (last 3 results) No results for input(s): PROBNP in the last 8760 hours. HbA1C: No results for input(s): HGBA1C in the last 72 hours. CBG: Recent Labs  Lab 06/06/17 1132 06/06/17 1536 06/06/17 2100 06/07/17 0733 06/07/17 1142  GLUCAP 141* 143* 152* 108* 98   Lipid Profile: No results for input(s): CHOL, HDL, LDLCALC, TRIG, CHOLHDL, LDLDIRECT in the last 72 hours. Thyroid Function Tests: No results for input(s): TSH, T4TOTAL, FREET4, T3FREE, THYROIDAB in the last 72 hours. Anemia Panel: No results for input(s): VITAMINB12, FOLATE, FERRITIN, TIBC, IRON, RETICCTPCT in the last 72 hours. Sepsis Labs: No results for input(s): PROCALCITON, LATICACIDVEN in the last 168 hours.  No results found for this or any previous visit (from the past 240 hour(s)).       Radiology Studies: No results found.      Scheduled Meds: . furosemide  80 mg Oral BID  . gabapentin  200 mg Oral QHS  . heparin  5,000 Units Subcutaneous Q8H  . insulin aspart  0-9 Units Subcutaneous TID WC  . levothyroxine  88 mcg Oral QAC breakfast  . linagliptin  5 mg Oral Daily  . loratadine  10 mg Oral Daily  . pravastatin  40 mg Oral q1800  . senna-docusate  1 tablet Oral BID   Continuous Infusions:   LOS: 3 days    Time spent: 35 minutes,     Alba CoryBelkys A Bess Saltzman, MD [facility] Pager [phone]  If 7PM-7AM, please contact night-coverage [email or web address] Password [email or web address] 06/07/2017, 1:20 PM

## 2017-06-07 NOTE — Consult Note (Signed)
Consultation Note Date: 06/07/2017   Patient Name: Michelle Morton  DOB: January 10, 1932  MRN: [id number]  Age / Sex: 84 y.o., female  PCP: Biagio Borg, MD Referring Physician: Elmarie Shiley, MD  Reason for Consultation: Establishing goals of care  HPI/Patient Profile: 84 y.o. female  with past medical history of diastolic CHF EF 08-02%, chronic kidney disease stage 4, diabetes mellitus type 2, HTN, HLD, hearing loss to right ear admitted from home where she lives with her daughter on 06/03/2017 with bilateral lower extremity swelling. She was recently admitted 05/06/17-05/11/17 followed by stay in [place] rehab. Reports swelling began as soon as she returned home and worsened over the course of ~1 week. She weighs herself daily but does not monitor salt intake very well. Palliative care requested to assist with [place] conversation.   Clinical Assessment and Goals of Care: I met today with [name]. Ms. Grams is very clear about her goals and is not interested in dialysis. She says that she has had many wonderful years and has a wonderful family and children and no regrets. She shares that she is a spiritual woman and is "ready to go" when her time comes. She does not fear death. She is hoping to return home ASAP and spend the rest of her life in the comfort of her home with her family (she lives with daughter Michelle Morton). We spoke about considering even hospice care with these goals.   She has 4 supportive children who will follow her wishes. She is a very strong lady and makes her own decisions. She has a husband of 20 years that lives with her other daughter as he has dementia and she could no longer care for him - "he doesn't even know who I am."   We were joined by her daughter, Michelle Morton. We again discussed options and then palliative vs hospice services upon discharge. After our discussion they are inclined to pursue hospice at home  upon discharge. [name] has no concerns except for returning home. She does agree with [name] that she is willing to stay in hospital until medically optimized but is not interested in aggressive measures. They understand that she has diuresed well but her labs are worse and know that she could decline quickly at home or could be well for a while at home. They like the idea of having hospice as a support.   Primary Decision Maker Patient    SUMMARY OF RECOMMENDATIONS   - DNR - Hopeful for home soon - Hoping for home with hospice  Code Status/Advance Care Planning:  DNR   Symptom Management:   Per primary, cardiology, renal.   Palliative Prophylaxis:   Frequent Pain Assessment  Additional Recommendations (Limitations, Scope, Preferences):  No Hemodialysis  Psycho-social/Spiritual:   Desire for further Chaplaincy support:yes  Additional Recommendations: Education on Hospice  Prognosis:   < 6 months likely with worsening renal disease  Discharge Planning: Home with Hospice      Primary Diagnoses: Present on Admission: . Acute diastolic CHF (  congestive heart failure) ([place]) . Chronic kidney disease (CKD), stage IV (severe) ([place]) . Essential hypertension . Hypothyroidism . DM (diabetes mellitus), type 2 with renal complications ([place])   I have reviewed the medical record, interviewed the patient and family, and examined the patient. The following aspects are pertinent.  Past Medical History:  Diagnosis Date  . Allergic rhinitis, cause unspecified 02/14/2013  . Arthritis   . Arthus phenomenon   . Diabetes mellitus, type 2 ([place])   . Hearing loss    right ear, no hearing aid  . History of blood transfusion    Michelle Morton - unsure number of units transfused  . Hyperlipidemia   . Hypertension   . Hypothyroidism   . Kidney stones   . Osteoarthritis, knee    knees - otc med prn  . SVD (spontaneous vaginal delivery)    x 4  . Varicose veins    lower legs    Social History   Socioeconomic History  . Marital status: Married    Spouse name: None  . Number of children: None  . Years of education: None  . Highest education level: None  Social Needs  . Financial resource strain: None  . Food insecurity - worry: None  . Food insecurity - inability: None  . Transportation needs - medical: None  . Transportation needs - non-medical: None  Occupational History  . None  Tobacco Use  . Smoking status: Former Smoker    Packs/day: 1.00    Years: 16.00    Pack years: 16.00    Types: Cigarettes    Last attempt to quit: 05/10/1992    Years since quitting: 25.0  . Smokeless tobacco: Never Used  Substance and Sexual Activity  . Alcohol use: No    Alcohol/week: 0.0 oz  . Drug use: No  . Sexual activity: Yes    Birth control/protection: Post-menopausal, Surgical    Comment: Hysterectomy  Other Topics Concern  . 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   Family History  Problem Relation Age of Onset  . Diabetes Mother   . Diabetes Father   . Hypertension Father   . Breast cancer Daughter   . Diabetes Brother        x 1  . Diabetes Sister        x 4  . Colon cancer Neg Hx   . Esophageal cancer Neg Hx   . Pancreatic cancer Neg Hx   . Liver disease Neg Hx   . Kidney disease Neg Hx    Scheduled Meds: . amiodarone  100 mg Oral Daily  . furosemide  80 mg Oral BID  . gabapentin  200 mg Oral QHS  . heparin  5,000 Units Subcutaneous Q8H  . insulin aspart  0-9 Units Subcutaneous TID WC  . levothyroxine  88 mcg Oral QAC breakfast  . linagliptin  5 mg Oral Daily  . loratadine  10 mg Oral Daily  . pravastatin  40 mg Oral q1800  . senna-docusate  1 tablet Oral BID   Continuous  Infusions: PRN Meds:.acetaminophen **OR** acetaminophen, diclofenac sodium, ondansetron **OR** ondansetron (ZOFRAN) IV Allergies  Allergen Reactions  . Penicillins Rash    Allergic to IV  penicillin but tolerates the oral form Has patient had a PCN reaction causing immediate rash, facial/tongue/throat swelling, SOB or lightheadedness with hypotension: no Has patient had a PCN reaction causing severe rash involving mucus membranes or skin necrosis: no Has patient had a PCN reaction that required hospitalization already in the hospital for other procedure  Has patient had a PCN reaction occurring within the last 10 years: no If all of the above answers are "NO", then ma   Review of Systems  Constitutional: Positive for activity change and fatigue.  Cardiovascular: Positive for leg swelling.  Neurological: Positive for weakness.    Physical Exam  Constitutional: She is oriented to person, place, and time. She appears well-developed.  HENT:  Head: Normocephalic and atraumatic.  Cardiovascular: Normal rate and regular rhythm.  Pulmonary/Chest: Effort normal. No accessory muscle usage. No tachypnea. No respiratory distress.  Abdominal: Normal appearance.  Neurological: She is alert and oriented to person, place, and time.  Nursing note and vitals reviewed.   Vital Signs: BP (!) 110/58 (BP Location: Right Arm)   Pulse 75   Temp 98.3 F (36.8 C) (Oral)   Resp 18   Ht '5\' 3"'$  (1.6 m)   Wt 76.3 kg (168 lb 4.8 oz) Comment: a scale  SpO2 100%   BMI 29.81 kg/m  Pain Assessment: No/denies pain   Pain Score: 4    SpO2: SpO2: 100 % O2 Device:SpO2: 100 % O2 Flow Rate: .O2 Flow Rate (L/min): 0 L/min  IO: Intake/output summary:   Intake/Output Summary (Last 24 hours) at 06/07/2017 0956 Last data filed at 06/07/2017 0843 Gross per 24 hour  Intake 1100 ml  Output 1250 ml  Net -150 ml    LBM: Last BM Date: 06/06/17 Baseline Weight: Weight: 82.6 kg (182 lb) Most recent weight: Weight:  76.3 kg (168 lb 4.8 oz)(a scale)     Palliative Assessment/Data: 50%    Time Total: 90 min  Greater than 50%  of this time was spent counseling and coordinating care related to the above assessment and plan.  Signed by: Vinie Sill, NP Palliative Medicine Team Pager # [phone] (M-F 8a-5p) Team Phone # [phone] (Nights/Weekends)

## 2017-06-07 NOTE — Progress Notes (Signed)
[name] ROUNDING NOTE   Subjective:   89F with AoC dCHF exacerbation and AoCKD4 with significant azotemia    Objective:  Vital signs in last 24 hours:  Temp:  [97.6 F (36.4 C)-98.3 F (36.8 C)] 98.3 F (36.8 C) (01/08 0650) Pulse Rate:  [75-81] 75 (01/08 0650) Resp:  [18-20] 18 (01/07 2026) BP: (94-119)/(58-85) 110/58 (01/08 0650) SpO2:  [100 %] 100 % (01/08 0650) Weight:  [168 lb 4.8 oz (76.3 kg)] 168 lb 4.8 oz (76.3 kg) (01/08 0650)  Weight change: -3.2 oz (-1.452 kg) Filed Weights   06/05/17 0534 06/06/17 0700 06/07/17 0650  Weight: 171 lb (77.6 kg) 171 lb 8 oz (77.8 kg) 168 lb 4.8 oz (76.3 kg)    Intake/Output: I/O last 3 completed shifts: In: 1540 [P.O.:1540] Out: 2250 [Urine:2250]   Intake/Output this shift:  Total I/O In: 380 [P.O.:380] Out: -   NAD, pleasant NCAT EOMI imiproved aeration in bases RRR nl s1s2 traceLEE Nonfocal      Basic Metabolic Panel: Recent Labs  Lab 06/03/17 1824 06/04/17 0118 06/05/17 0448 06/06/17 0521 06/07/17 0640  NA 134*  --  135 138 136  K 4.5  --  4.1 4.2 4.4  CL 102  --  99* 102 97*  CO2 21*  --  23 25 26   GLUCOSE 225*  --  120* 82 125*  BUN 144*  --  147* 156* 153*  CREATININE 2.96*  --  2.70* 2.71* 2.74*  CALCIUM 9.2  --  9.0 8.6* 8.3*  MG  --  2.1  --   --   --     Liver Function Tests: Recent Labs  Lab 06/03/17 1824  AST 19  ALT 16  ALKPHOS 52  BILITOT 0.6  PROT 7.8  ALBUMIN 3.7   No results for input(s): LIPASE, AMYLASE in the last 168 hours. No results for input(s): AMMONIA in the last 168 hours.  CBC: Recent Labs  Lab 06/03/17 1824  WBC 6.3  NEUTROABS 4.2  HGB 10.4*  HCT 32.2*  MCV 92.0  PLT 269    Cardiac Enzymes: Recent Labs  Lab 06/04/17 0637  TROPONINI <0.03    BNP: Invalid input(s): POCBNP  CBG: Recent Labs  Lab 06/06/17 0746 06/06/17 1132 06/06/17 1536 06/06/17 2100 06/07/17 0733  GLUCAP 85 141* 143* 152* 108*     Microbiology: Results for orders placed or performed during the hospital encounter of 09/24/15  Urine culture     Status: None   Collection Time: 09/24/15  3:42 PM  Result Value Ref Range Status   Specimen Description URINE, CLEAN CATCH  Final   Special Requests NONE  Final   Culture NO GROWTH 1 DAY  Final   Report Status 09/25/2015 FINAL  Final    Coagulation Studies: No results for input(s): LABPROT, INR in the last 72 hours.  Urinalysis: No results for input(s): COLORURINE, LABSPEC, PHURINE, GLUCOSEU, HGBUR, BILIRUBINUR, KETONESUR, PROTEINUR, UROBILINOGEN, NITRITE, LEUKOCYTESUR in the last 72 hours.  Invalid input(s): APPERANCEUR    Imaging: No results found.   Medications:    . amiodarone  100 mg Oral Daily  . furosemide  80 mg Intravenous BID  . gabapentin  200 mg Oral QHS  . heparin  5,000 Units Subcutaneous Q8H  . insulin aspart  0-9 Units Subcutaneous TID WC  . levothyroxine  88 mcg Oral QAC breakfast  . linagliptin  5 mg Oral Daily  . loratadine  10 mg Oral Daily  . pravastatin  40 mg Oral  q1800  . senna-docusate  1 tablet Oral BID   acetaminophen **OR** acetaminophen, diclofenac sodium, ondansetron **OR** ondansetron (ZOFRAN) IV  Assessment/ Plan:  1. AoCKD4 with significant azotemia 2. AoC dCHF exacerbation; change to 80mg  oral twice a day 3. HTN, stable 4. Anemia  Patient not interested in dialysis  Will have palliative medicine evaluate for home management      LOS: 3 Michelle Morton @TODAY @9 :43 AM

## 2017-06-07 NOTE — Plan of Care (Signed)
  Education: Knowledge of General Education information will improve 06/07/2017 0736 - Adequate for Discharge by Chaya Janunn, Eeshan Verbrugge K, RN

## 2017-06-08 ENCOUNTER — Telehealth: Payer: Self-pay | Admitting: Internal Medicine

## 2017-06-08 DIAGNOSIS — E785 Hyperlipidemia, unspecified: Secondary | ICD-10-CM

## 2017-06-08 DIAGNOSIS — N184 Chronic kidney disease, stage 4 (severe): Secondary | ICD-10-CM

## 2017-06-08 DIAGNOSIS — Z7984 Long term (current) use of oral hypoglycemic drugs: Secondary | ICD-10-CM

## 2017-06-08 DIAGNOSIS — M159 Polyosteoarthritis, unspecified: Secondary | ICD-10-CM

## 2017-06-08 DIAGNOSIS — I130 Hypertensive heart and chronic kidney disease with heart failure and stage 1 through stage 4 chronic kidney disease, or unspecified chronic kidney disease: Secondary | ICD-10-CM

## 2017-06-08 DIAGNOSIS — E1122 Type 2 diabetes mellitus with diabetic chronic kidney disease: Secondary | ICD-10-CM

## 2017-06-08 DIAGNOSIS — J209 Acute bronchitis, unspecified: Secondary | ICD-10-CM

## 2017-06-08 DIAGNOSIS — I5033 Acute on chronic diastolic (congestive) heart failure: Secondary | ICD-10-CM | POA: Diagnosis not present

## 2017-06-08 DIAGNOSIS — M6281 Muscle weakness (generalized): Secondary | ICD-10-CM

## 2017-06-08 LAB — BASIC METABOLIC PANEL
ANION GAP: 12 (ref 5–15)
BUN: 156 mg/dL — ABNORMAL HIGH (ref 6–20)
CALCIUM: 7.7 mg/dL — AB (ref 8.9–10.3)
CO2: 26 mmol/L (ref 22–32)
CREATININE: 2.77 mg/dL — AB (ref 0.44–1.00)
Chloride: 98 mmol/L — ABNORMAL LOW (ref 101–111)
GFR calc non Af Amer: 15 mL/min — ABNORMAL LOW (ref >60)
GFR, EST AFRICAN AMERICAN: 17 mL/min — AB (ref >60)
Glucose, Bld: 91 mg/dL (ref 65–99)
Potassium: 4.4 mmol/L (ref 3.5–5.1)
SODIUM: 136 mmol/L (ref 135–145)

## 2017-06-08 LAB — GLUCOSE, CAPILLARY
GLUCOSE-CAPILLARY: 96 mg/dL (ref 65–99)
GLUCOSE-CAPILLARY: 120 mg/dL — AB (ref 65–99)

## 2017-06-08 MED ORDER — FUROSEMIDE 80 MG PO TABS: 80.0000 mg | ORAL_TABLET | Freq: Two times a day (BID) | ORAL | 0 refills | Status: DC

## 2017-06-08 NOTE — Care Management Important Message (Signed)
Important Message  Patient Details  Name: Michelle KnudsenMamie W Sianez MRN: [id number] Date of Birth: 01-09-1932   Medicare Important Message Given:  Yes    Chrystopher Stangl Stefan ChurchBratton 06/08/2017, 10:33 AM

## 2017-06-08 NOTE — Progress Notes (Signed)
[facility] ROUNDING NOTE   Subjective:    18F with AoC dCHF exacerbation and AoCKD4 with significant azotemia    Objective:  Vital signs in last 24 hours:  Temp:  [97.5 F (36.4 C)-98.2 F (36.8 C)] 97.5 F (36.4 C) (01/09 0616) Pulse Rate:  [69-77] 69 (01/09 0616) Resp:  [18] 18 (01/08 2025) BP: (107-114)/(53-59) 114/59 (01/09 0616) SpO2:  [98 %-100 %] 98 % (01/09 0616) Weight:  [170 lb 1.6 oz (77.2 kg)] 170 lb 1.6 oz (77.2 kg) (01/09 0616)  Weight change: 1 lb 12.8 oz (0.816 kg) Filed Weights   06/06/17 0700 06/07/17 0650 06/08/17 0616  Weight: 171 lb 8 oz (77.8 kg) 168 lb 4.8 oz (76.3 kg) 170 lb 1.6 oz (77.2 kg)    Intake/Output: I/O last 3 completed shifts: In: 1340 [P.O.:1340] Out: 2000 [Urine:2000]   Intake/Output this shift:  Total I/O In: 180 [P.O.:180] Out: -   CVS- RRR RS- CTA ABD- BS present soft non-distended EXT- no edema   Basic Metabolic Panel: Recent Labs  Lab 06/03/17 1824 06/04/17 0118 06/05/17 0448 06/06/17 0521 06/07/17 0640 06/08/17 0445  NA 134*  --  135 138 136 136  K 4.5  --  4.1 4.2 4.4 4.4  CL 102  --  99* 102 97* 98*  CO2 21*  --  23 25 26 26   GLUCOSE 225*  --  120* 82 125* 91  BUN 144*  --  147* 156* 153* 156*  CREATININE 2.96*  --  2.70* 2.71* 2.74* 2.77*  CALCIUM 9.2  --  9.0 8.6* 8.3* 7.7*  MG  --  2.1  --   --   --   --     Liver Function Tests: Recent Labs  Lab 06/03/17 1824  AST 19  ALT 16  ALKPHOS 52  BILITOT 0.6  PROT 7.8  ALBUMIN 3.7   No results for input(s): LIPASE, AMYLASE in the last 168 hours. No results for input(s): AMMONIA in the last 168 hours.  CBC: Recent Labs  Lab 06/03/17 1824  WBC 6.3  NEUTROABS 4.2  HGB 10.4*  HCT 32.2*  MCV 92.0  PLT 269    Cardiac Enzymes: Recent Labs  Lab 06/04/17 0637  TROPONINI <0.03    BNP: Invalid input(s): POCBNP  CBG: Recent Labs  Lab 06/07/17 0733 06/07/17 1142 06/07/17 1633 06/07/17 2115 06/08/17 0735  GLUCAP 108*  98 140* 216* 96    Microbiology: Results for orders placed or performed during the hospital encounter of 09/24/15  Urine culture     Status: None   Collection Time: 09/24/15  3:42 PM  Result Value Ref Range Status   Specimen Description URINE, CLEAN CATCH  Final   Special Requests NONE  Final   Culture NO GROWTH 1 DAY  Final   Report Status 09/25/2015 FINAL  Final    Coagulation Studies: No results for input(s): LABPROT, INR in the last 72 hours.  Urinalysis: No results for input(s): COLORURINE, LABSPEC, PHURINE, GLUCOSEU, HGBUR, BILIRUBINUR, KETONESUR, PROTEINUR, UROBILINOGEN, NITRITE, LEUKOCYTESUR in the last 72 hours.  Invalid input(s): APPERANCEUR    Imaging: No results found.   Medications:    . furosemide  80 mg Oral BID  . gabapentin  200 mg Oral QHS  . heparin  5,000 Units Subcutaneous Q8H  . insulin aspart  0-9 Units Subcutaneous TID WC  . levothyroxine  88 mcg Oral QAC breakfast  . linagliptin  5 mg Oral Daily  . loratadine  10 mg Oral Daily  .  pravastatin  40 mg Oral q1800  . senna-docusate  1 tablet Oral BID   acetaminophen **OR** acetaminophen, diclofenac sodium, ondansetron **OR** ondansetron (ZOFRAN) IV  Assessment/ Plan:  1. AoCKD4 with significant azotemia 2. AoC dCHF exacerbation; changed to 80mg  oral twice a day 3. HTN, stable 4. Anemia  Patient not interested in dialysis  Will have palliative medicine evaluate for home management    Will sign off  -- no dialysis planned please call Dr Hyman Hopes [phone] if needed     LOS: 4 Michelle Morton @TODAY @9 :33 AM

## 2017-06-08 NOTE — Plan of Care (Signed)
  Education: Knowledge of General Education information will improve 06/08/2017 0715 - Adequate for Discharge by Chaya Janunn, Farah Benish K, RN   Clinical Measurements: Ability to maintain clinical measurements within normal limits will improve 06/08/2017 0715 - Adequate for Discharge by Chaya Janunn, Tucker Steedley K, RN Will remain free from infection 06/08/2017 0715 - Adequate for Discharge by Chaya Janunn, Cherre Kothari K, RN Diagnostic test results will improve 06/08/2017 0715 - Adequate for Discharge by Chaya Janunn, Edna Grover K, RN Respiratory complications will improve 06/08/2017 0715 - Adequate for Discharge by Chaya Janunn, Imari Reen K, RN Cardiovascular complication will be avoided 06/08/2017 0715 - Adequate for Discharge by Chaya Janunn, Nadirah Socorro K, RN   Activity: Risk for activity intolerance will decrease 06/08/2017 0715 - Adequate for Discharge by Chaya Janunn, Lujain Kraszewski K, RN   Coping: Level of anxiety will decrease 06/08/2017 0715 - Adequate for Discharge by Chaya Janunn, Lakeisa Heninger K, RN   Elimination: Will not experience complications related to bowel motility 06/08/2017 0715 - Adequate for Discharge by Chaya Janunn, Maveryck Bahri K, RN Will not experience complications related to urinary retention 06/08/2017 0715 - Adequate for Discharge by Chaya Janunn, Shavonn Convey K, RN   Pain Managment: General experience of comfort will improve 06/08/2017 0715 - Adequate for Discharge by Chaya Janunn, Malarie Tappen K, RN   Safety: Ability to remain free from injury will improve 06/08/2017 0715 - Adequate for Discharge by Chaya Janunn, Breonna Gafford K, RN   Skin Integrity: Risk for impaired skin integrity will decrease 06/08/2017 0715 - Adequate for Discharge by Chaya Janunn, Parthiv Mucci K, RN   Education: Ability to demonstrate management of disease process will improve 06/08/2017 0715 - Adequate for Discharge by Chaya Janunn, Braxdon Gappa K, RN Ability to verbalize understanding of medication therapies will improve 06/08/2017 0715 - Adequate for Discharge by Chaya Janunn, Zalaya Astarita K, RN   Cardiac: Ability to achieve and maintain adequate cardiopulmonary perfusion will  improve 06/08/2017 0715 - Adequate for Discharge by Chaya Janunn, Jenavi Beedle K, RN   Discharge Planning: Knowledge of Discharge Plans will increase 06/08/2017 0715 - Adequate for Discharge by Chaya Janunn, Gloyd Happ K, RN

## 2017-06-08 NOTE — Progress Notes (Signed)
PT Cancellation Note  Patient Details Name: Michelle Morton MRN: [id number] DOB: Apr 14, 1932   Cancelled Treatment:    Reason Eval/Treat Not Completed: (P) Other (comment)(Pt meeting with pallative care at this moment will return as time permits.  )   Tamanna Whitson Artis DelayJ Shellsea Borunda 06/08/2017, 11:26 AM  Joycelyn RuaAimee Shanin Szymanowski, PTA pager [phone]

## 2017-06-08 NOTE — Telephone Encounter (Signed)
Copied from CRM [phone]. Topic: Quick Communication - See Telephone Encounter >> Jun 08, 2017  2:58 PM Cipriano Morton, Michelle S wrote: CRM for notification. See Telephone encounter for:  [facility] and needs Verbal confirmation from [name]  will be the attending physician.  [facility] [phone]  06/08/17.

## 2017-06-08 NOTE — Progress Notes (Signed)
Patient will be discharging to home today with [facility]. Daughter will provide transportation. Hospice SW meeting with patient at this time. DNR page, all follow up appts reviewed with patient and family.  All personal belongings with patient. Patient demonstrates no distress at this time.

## 2017-06-08 NOTE — Progress Notes (Signed)
PT Cancellation Note  Patient Details Name: Michelle Morton MRN: [id number] DOB: [birth year]   Cancelled Treatment:    Reason Eval/Treat Not Completed: (P) Patient declined, no reason specified(Pt declined PT visit before d/c home.  Pt reports, I am only interested in getting dressed and going home.  Will cancel treatment at this time due to patient request.  )   Michelle Morton Artis DelayJ Ziyan Hillmer 06/08/2017, 2:21 PM  [name], PTA pager [phone]

## 2017-06-08 NOTE — Care Management Note (Signed)
Case Management Note  Patient Details  Name: Michelle Morton MRN: [id number] Date of Birth: 01/10/1932  Subjective/Objective:  CHF                 Action/Plan: CM talked to patient about home hospice choice, [facility]; Chales AbrahamsMary Ann with [facility] called for arrangements.  Expected Discharge Date:  06/08/17               Expected Discharge Plan:  Home w Hospice Care  In-House Referral:   Palliative Care  Discharge planning Services  CM Consult  Post Acute Care Choice:    Choice offered to:  [facility]  [facility]  Status of Service:  In process, will continue to follow  Reola MosherChandler, Mavery Milling L, RN,MHA,BSN [phone] 06/08/2017, 12:11 PM

## 2017-06-08 NOTE — Telephone Encounter (Signed)
[facility] and informed her that Lennon AlstromJJ will be the attending MD.

## 2017-06-08 NOTE — Progress Notes (Signed)
CM talked to patient about Home Hospice choice; patient stated that Palliative Care plans to have a meeting her family today at 6111 am to discuss DCP; CM to follow up; Alexis GoodellB Jolisa Intriago RN,MHA,BSN [phone]

## 2017-06-08 NOTE — Progress Notes (Signed)
[facility] Hospital Liaison: RN visit  Notified by Jiles CrockerBrenda Chandler, [facility] of patient/family request [facility] at [facility] after discharge. Chart and patient information under review by [facility]. Hospice eligibility pending at this time.  Writer spoke with patient and daughter, Angelique BlonderDenise  bedside to initiate education related to hospice philosophy, services and team approach to care. verbalized understanding of information given. Per discussion, plan is for discharge to home by private vehicle today. Please send signed and completed DNR form home with patient/family. Patient will need prescriptions for discharge comfort medications.  DME needs have been discussed, patient currently has the following equipment in the home: Dan HumphreysWalker, W/C, 3N1, shower chair. Patient/family requests the following DME for delivery to the home: none.  [facility] Referral Center aware of the above. Please notify [facility] when patient is ready to leave the unit at discharge. (Call [phone] or [phone] after 5pm.) [facility] information and contact numbers given to at time of visit. Above information shared with CMRN.  Please call with any hospice related questions.  Thank you for this referral.  Elsie SaasMary Anne Robertson, RN, [facility] Hospital Liaison [phone] ? Hospital liaisons are now on AMION.

## 2017-06-08 NOTE — Progress Notes (Signed)
Daily Progress Note   Patient Name: Michelle Morton       Date: 06/08/2017 DOB: 03/23/1932  Age: 84 y.o. MRN#: [id number] Attending Physician: Elmarie Shiley, MD Primary Care Physician: Biagio Borg, MD Admit Date: 06/03/2017  Reason for Consultation/Follow-up: Establishing goals of care  Subjective: Michelle Morton is sitting up in recliner and reading her newspaper.   Length of Stay: 4  Current Medications: Scheduled Meds:  . furosemide  80 mg Oral BID  . gabapentin  200 mg Oral QHS  . heparin  5,000 Units Subcutaneous Q8H  . insulin aspart  0-9 Units Subcutaneous TID WC  . levothyroxine  88 mcg Oral QAC breakfast  . linagliptin  5 mg Oral Daily  . loratadine  10 mg Oral Daily  . pravastatin  40 mg Oral q1800  . senna-docusate  1 tablet Oral BID    Continuous Infusions:   PRN Meds: acetaminophen **OR** acetaminophen, diclofenac sodium, ondansetron **OR** ondansetron (ZOFRAN) IV  Physical Exam         Constitutional: She is oriented to person, place, and time. She appears well-developed.  HENT:  Head: Normocephalic and atraumatic.  Cardiovascular: Normal rate and regular rhythm.  Pulmonary/Chest: Effort normal. No accessory muscle usage. No tachypnea. No respiratory distress.  Abdominal: Normal appearance.  Neurological: She is alert and oriented to person, place, and time.  Nursing note and vitals reviewed.   Vital Signs: BP (!) 114/59 (BP Location: Right Arm)   Pulse 69   Temp (!) 97.5 F (36.4 C) (Oral)   Resp 18   Ht '5\' 3"'$  (1.6 m)   Wt 77.2 kg (170 lb 1.6 oz) Comment: a scale  SpO2 98%   BMI 30.13 kg/m  SpO2: SpO2: 98 % O2 Device: O2 Device: Not Delivered O2 Flow Rate: O2 Flow Rate (L/min): 0 L/min  Intake/output summary:   Intake/Output Summary (Last 24  hours) at 06/08/2017 1054 Last data filed at 06/08/2017 0740 Gross per 24 hour  Intake 660 ml  Output 750 ml  Net -90 ml   LBM: Last BM Date: 06/07/17 Baseline Weight: Weight: 82.6 kg (182 lb) Most recent weight: Weight: 77.2 kg (170 lb 1.6 oz)(a scale)       Palliative Assessment/Data: 50%      Patient Active Problem List   Diagnosis Date  Noted  . Palliative care encounter   . CHF (congestive heart failure) ([place]) 06/04/2017  . DM (diabetes mellitus), type 2 with renal complications ([place]) 79/07/4095  . Weight gain 06/03/2017  . Acute diastolic CHF (congestive heart failure) ([place]) 05/07/2017  . Bilateral lower extremity edema 05/06/2017  . Osteoporosis 01/26/2017  . Degenerative disc disease, lumbar 12/29/2016  . Neck pain on left side 12/12/2016  . Leg cramps 06/05/2016  . General weakness 03/04/2016  . Hypoglycemia 12/17/2015  . RLS (restless legs syndrome) 12/17/2015  . Weight loss 12/17/2015  . Back pain 10/16/2015  . Right-sided chest pain 03/27/2015  . Diarrhea 03/27/2015  . Hoarseness 03/27/2015  . Loss of weight 03/27/2015  . Depression 01/07/2015  . Chronic kidney disease (CKD), stage IV (severe) ([place]) 01/07/2015  . AKI (acute kidney injury) ([place]) 10/09/2014  . Uterine prolaps 09/01/2013  . Peripheral edema 02/14/2013  . Allergic rhinitis 02/14/2013  . Obesity, Class II, BMI 35-39.9 05/12/2011  . Preventative health care 05/12/2011  . TINNITUS 01/09/2009  . Essential hypertension 05/05/2007  . Hypothyroidism 02/28/2007  . Diabetes ([place]) 02/28/2007  . Hyperlipidemia 02/28/2007  . Osteoarthrosis, unspecified whether generalized or localized, involving lower leg 02/28/2007    Palliative Care Assessment & Plan   HPI: 84 y.o. female  with past medical history of diastolic CHF EF 35-32%, chronic kidney disease stage 4, diabetes mellitus type 2, HTN, HLD, hearing loss to right ear admitted from home where she lives with her daughter on 06/03/2017 with bilateral  lower extremity swelling. She was recently admitted 05/06/17-05/11/17 followed by stay in [place] rehab. Reports swelling began as soon as she returned home and worsened over the course of ~1 week. She weighs herself daily but does not monitor salt intake very well. Palliative care requested to assist with [place] conversation.    Assessment: I met today with Michelle Morton and her daughter, Langley Gauss. Langley Gauss is a Education officer, museum and has many good questions. We discussed palliative vs hospice services at home and Langley Gauss agrees that they would all benefit from hospice assistance. They are both concerned about the help and support she will need at home and that family may not be able to be there as much as needed. Langley Gauss is very interested in speaking with her family about arranging a calendar to help support her mother at home. Michelle Morton became very upset during this conversation and admits that she is tearful because we talk like will be dying in the next days to couple weeks. I offered support and reassured her that everyone just wants to make sure she has the help she needs and that we are just planning for worst case scenario. Explained that Langley Gauss also just wants to make sure she is safe and happy at home and is scared she could fall - not just to have people there because she is dying. Michelle Morton understands. Michelle Morton also expressed that she would like to be at [facility] at the EOL.   Langley Gauss has concerns about hospice getting into the home and her mother being alone. I negotiated that hospice will be in and out to check on her (encouraged them to speak with hospice about plans on getting into the home if Michelle Morton cannot get to the door) and that they will set up a phone schedule to call and check on Michelle Morton when they are not there so that she can maintain independence as long as possible (and give children peace of mind that she is  safe).   After [facility] left Michelle Morton expressed frustration that her children do not get  along. I told her that I am sure they all just want what is best for her and hopefully they can all come together for her at this time. She seemed in better spirits. Very much anticipating going home today with hospice assistance.   Recommendations/Plan:  Hospice to help manage symptoms at home. She does wish to follow up with Dr. Lorrene Reid next week.   Currently no symptom needs. Anticipate weakness and even SOB with return of fluid.   Goals of Care and Additional Recommendations:  Limitations on Scope of Treatment: No Hemodialysis  Code Status:  DNR  Prognosis:   < 6 months  Discharge Planning:  Home with Hospice   Thank you for allowing the Palliative Medicine Team to assist in the care of this patient.   Total Time 60 min Prolonged Time Billed  no       Greater than 50%  of this time was spent counseling and coordinating care related to the above assessment and plan.  Vinie Sill, NP Palliative Medicine Team Pager # [phone] (M-F 8a-5p) Team Phone # [phone] (Nights/Weekends)

## 2017-06-08 NOTE — Plan of Care (Signed)
  Health Behavior/Discharge Planning: Ability to manage health-related needs will improve 06/08/2017 0110 - Adequate for Discharge by Jeanella Flatteryhomas, Leanore Biggers T, RN

## 2017-06-08 NOTE — Discharge Summary (Signed)
Physician Discharge Summary  Michelle Morton VFI:[id number] DOB: 1932-04-02 DOA: 06/03/2017  PCP: Biagio Borg, MD  Admit date: 06/03/2017 Discharge date: 06/08/2017  Admitted From: Home  Disposition: Home with hospice.   Recommendations for Outpatient Follow-up:  1. Follow up with PCP in 1-2 weeks 2. Please obtain BMP/CBC in one week 3. Adjust lasix as needed.    Discharge Condition: Stable.  CODE STATUS: DNR Diet recommendation: Heart Healthy  Brief/Interim Summary: Brief Narrative: Michelle Morton a 84 y.o.femalewithhistory of chronic diastolic CHF last EF measured in March 2018 was 55-60% with grade 1 diastolic dysfunction was referred to the ER by patient's primary care physician as patient was found to have increasing lower extremity edema and shortness of breath. Patient was recently admitted in December last month for similar complaints and was placed on diuresis following which patient improved. At discharge patient's weight was around 168 pounds. Presently on admission is 182 pounds. Patient states she has been compliant with her medications. Denies any chest pain or fever chills.  ED Course:In the ER on exam patient has lower extremity edema extending up to the thighs. Chest x-ray was compatible with pulmonary edema. Patient was given Lasix 80 mg IV and admitted for further management of CHF. Patient's creatinine has increased from baseline.   Assessment & Plan:   Active Problems:   Hypothyroidism   Essential hypertension   Chronic kidney disease (CKD), stage IV (severe) (HCC)   Acute diastolic CHF (congestive heart failure) (HCC)   CHF (congestive heart failure) (HCC)   DM (diabetes mellitus), type 2 with renal complications (HCC)   1-Acute on chronic diastolic HF;  Treated initially with IV lasix 80 Mg TID then BID.  Urine out put 750 Strict I and O. Negative 4 L.  Daily weight. 171 ---171---168-171 Weight last discharge 168 pounds.  Nephrology  consulted. Appreciate help.  Plan to discharge on oral lasix 80 mg BID.   2-Acute on CKD stage IV;  Last cr 2.5 three weeks ago.  Cr on admission 2.9. --2.7--2.7 Monitor on lasix. Plan to discharge on 80 mg BID>  Nephrology consulted.  Patient decline HD. Nephrology recommend palliative for goals of care.  Patient decline HD. Plan is to go home with hospice.  Discharge on lasix 80 mg BID, can be titrated up for increase weight.   3-DM;  SSI;  Tradjenta.   Anemia of chronic diseases;  Monitor hb.   Hypothyroidism; continue with synthroid.   Hyperlipidemia; continue with statins.      Discharge Diagnoses:  Active Problems:   Hypothyroidism   Essential hypertension   Chronic kidney disease (CKD), stage IV (severe) (HCC)   Acute diastolic CHF (congestive heart failure) (HCC)   CHF (congestive heart failure) (HCC)   DM (diabetes mellitus), type 2 with renal complications [facility])   Palliative care encounter    Discharge Instructions  Discharge Instructions    Diet - low sodium heart healthy   Complete by:  As directed    Increase activity slowly   Complete by:  As directed      Allergies as of 06/08/2017      Reactions   Penicillins Rash   Allergic to IV penicillin but tolerates the oral form Has patient had a PCN reaction causing immediate rash, facial/tongue/throat swelling, SOB or lightheadedness with hypotension: no Has patient had a PCN reaction causing severe rash involving mucus membranes or skin necrosis: no Has patient had a PCN reaction that required hospitalization already in the hospital for  other procedure  Has patient had a PCN reaction occurring within the last 10 years: no If all of the above answers are "NO", then ma      Medication List    TAKE these medications   acetaminophen 500 MG tablet Commonly known as:  TYLENOL Take 1,000 mg by mouth at bedtime.   amLODipine 5 MG tablet Commonly known as:  NORVASC Take 5 mg by mouth daily.    cetirizine 10 MG tablet Commonly known as:  ZYRTEC Take 10 mg by mouth daily.   diclofenac sodium 1 % Gel Commonly known as:  VOLTAREN APPLY 4 GRAMS TOPICALLY FOUR TIMES DAILY as needed What changed:    how much to take  how to take this  when to take this  reasons to take this  additional instructions   furosemide 80 MG tablet Commonly known as:  LASIX Take 1 tablet (80 mg total) by mouth 2 (two) times daily. What changed:    medication strength  how much to take   gabapentin 100 MG capsule Commonly known as:  NEURONTIN Take 2 capsules (200 mg total) by mouth at bedtime.   JANUVIA 50 MG tablet Generic drug:  sitaGLIPtin TAKE ONE TABLET BY MOUTH ONCE DAILY   Lancets Misc Use as directed once per day   levothyroxine 88 MCG tablet Commonly known as:  SYNTHROID, LEVOTHROID TAKE ONE TABLET BY MOUTH ONCE DAILY   lovastatin 40 MG tablet Commonly known as:  MEVACOR Take 1 tablet (40 mg total) by mouth at bedtime.   NASAL SPRAY NA Place 1 spray into the nose 2 (two) times daily as needed (allergies).   ONE TOUCH ULTRA 2 w/Device Kit Use as directed once per day   ONE TOUCH ULTRA TEST test strip Generic drug:  glucose blood USE AS DIRECTED   Vitamin D3 50000 units Caps Take 1 capsule by mouth every 7 (seven) days.       Allergies  Allergen Reactions  . Penicillins Rash    Allergic to IV penicillin but tolerates the oral form Has patient had a PCN reaction causing immediate rash, facial/tongue/throat swelling, SOB or lightheadedness with hypotension: no Has patient had a PCN reaction causing severe rash involving mucus membranes or skin necrosis: no Has patient had a PCN reaction that required hospitalization already in the hospital for other procedure  Has patient had a PCN reaction occurring within the last 10 years: no If all of the above answers are "NO", then ma    Consultations:  Nephrology    Procedures/Studies: Dg Chest 2 View  Result  Date: 06/03/2017 CLINICAL DATA:  Wheezing and shortness of breath for 1 week. Bilateral leg swelling and weight gain over 2 weeks. EXAM: CHEST  2 VIEW COMPARISON:  05/06/2017 FINDINGS: Shallow inspiration. Mild cardiac enlargement and pulmonary vascular congestion. Developing fine interstitial pattern suggest early developing interstitial edema. Calcification of the aorta. No pneumothorax. IMPRESSION: Cardiac enlargement with mild pulmonary vascular congestion and developing interstitial edema. Electronically Signed   By: Lucienne Capers M.D.   On: 06/03/2017 19:12     Subjective: She is feeling better, denies dyspnea.  Daughter and son are coming today for meeting with SW, Palliative ?   Discharge Exam: Vitals:   06/07/17 2025 06/08/17 0616  BP: (!) 107/53 (!) 114/59  Pulse: 77 69  Resp: 18   Temp: 98.2 F (36.8 C) (!) 97.5 F (36.4 C)  SpO2: 100% 98%   Vitals:   06/06/17 2026 06/07/17 0650 06/07/17 2025 06/08/17  0616  BP: 94/62 (!) 110/58 (!) 107/53 (!) 114/59  Pulse: 77 75 77 69  Resp: 18  18   Temp: 97.6 F (36.4 C) 98.3 F (36.8 C) 98.2 F (36.8 C) (!) 97.5 F (36.4 C)  TempSrc: Oral Oral Oral Oral  SpO2: 100% 100% 100% 98%  Weight:  76.3 kg (168 lb 4.8 oz)  77.2 kg (170 lb 1.6 oz)  Height:        General: Pt is alert, awake, not in acute distress Cardiovascular: RRR, S1/S2 +, no rubs, no gallops Respiratory: CTA bilaterally, no wheezing, no rhonchi Abdominal: Soft, NT, ND, bowel sounds + Extremities: no edema, no cyanosis    The results of significant diagnostics from this hospitalization (including imaging, microbiology, ancillary and laboratory) are listed below for reference.     Microbiology: No results found for this or any previous visit (from the past 240 hour(s)).   Labs: BNP (last 3 results) Recent Labs    05/06/17 2328 06/03/17 1824  BNP 121.3* 283.6*   Basic Metabolic Panel: Recent Labs  Lab 06/03/17 1824 06/04/17 0118 06/05/17 0448  06/06/17 0521 06/07/17 0640 06/08/17 0445  NA 134*  --  135 138 136 136  K 4.5  --  4.1 4.2 4.4 4.4  CL 102  --  99* 102 97* 98*  CO2 21*  --  '23 25 26 26  '$ GLUCOSE 225*  --  120* 82 125* 91  BUN 144*  --  147* 156* 153* 156*  CREATININE 2.96*  --  2.70* 2.71* 2.74* 2.77*  CALCIUM 9.2  --  9.0 8.6* 8.3* 7.7*  MG  --  2.1  --   --   --   --    Liver Function Tests: Recent Labs  Lab 06/03/17 1824  AST 19  ALT 16  ALKPHOS 52  BILITOT 0.6  PROT 7.8  ALBUMIN 3.7   No results for input(s): LIPASE, AMYLASE in the last 168 hours. No results for input(s): AMMONIA in the last 168 hours. CBC: Recent Labs  Lab 06/03/17 1824  WBC 6.3  NEUTROABS 4.2  HGB 10.4*  HCT 32.2*  MCV 92.0  PLT 269   Cardiac Enzymes: Recent Labs  Lab 06/04/17 0637  TROPONINI <0.03   BNP: Invalid input(s): POCBNP CBG: Recent Labs  Lab 06/07/17 0733 06/07/17 1142 06/07/17 1633 06/07/17 2115 06/08/17 0735  GLUCAP 108* 98 140* 216* 96   D-Dimer No results for input(s): DDIMER in the last 72 hours. Hgb A1c No results for input(s): HGBA1C in the last 72 hours. Lipid Profile No results for input(s): CHOL, HDL, LDLCALC, TRIG, CHOLHDL, LDLDIRECT in the last 72 hours. Thyroid function studies No results for input(s): TSH, T4TOTAL, T3FREE, THYROIDAB in the last 72 hours.  Invalid input(s): FREET3 Anemia work up No results for input(s): VITAMINB12, FOLATE, FERRITIN, TIBC, IRON, RETICCTPCT in the last 72 hours. Urinalysis    Component Value Date/Time   COLORURINE STRAW (A) 05/06/2017 2245   APPEARANCEUR CLEAR 05/06/2017 2245   LABSPEC 1.009 05/06/2017 2245   PHURINE 6.0 05/06/2017 2245   GLUCOSEU NEGATIVE 05/06/2017 2245   GLUCOSEU NEGATIVE 10/16/2015 1436   HGBUR NEGATIVE 05/06/2017 2245   BILIRUBINUR NEGATIVE 05/06/2017 2245   KETONESUR NEGATIVE 05/06/2017 2245   PROTEINUR NEGATIVE 05/06/2017 2245   UROBILINOGEN 0.2 10/16/2015 1436   NITRITE NEGATIVE 05/06/2017 2245   LEUKOCYTESUR  NEGATIVE 05/06/2017 2245   Sepsis Labs Invalid input(s): PROCALCITONIN,  WBC,  LACTICIDVEN Microbiology No results found for this or any previous  visit (from the past 240 hour(s)).   Time coordinating discharge: Over 30 minutes  SIGNED:   Elmarie Shiley, MD  [facility] 06/08/2017, 9:17 AM Pager [phone]  If 7PM-7AM, please contact night-coverage [email or web address] Password [email or web address]

## 2017-06-09 ENCOUNTER — Telehealth: Payer: Self-pay | Admitting: *Deleted

## 2017-06-09 NOTE — Telephone Encounter (Signed)
Transition Care Management Follow-up Telephone Call   Date discharged? 06/08/17   How have you been since you were released from the hospital? Pt states she seem to be doing fairly well   Do you understand why you were in the hospital? YES   Do you understand the discharge instructions? YES   Where were you discharged to? Home   Items Reviewed:  Medications reviewed: YES  Allergies reviewed: YES  Dietary changes reviewed: YES, heart healthy  Referrals reviewed: No referral needed   Functional Questionnaire:   Activities of Daily Living (ADLs):   She states she are independent in the following: bathing and hygiene, feeding, continence, grooming, toileting and dressing States they require assistance with the following: ambulation sometimes   Any transportation issues/concerns?: NO   Any patient concern? NO   Confirmed importance and date/time of follow-up visits scheduled YES,pt already had a f/u appt scheduled for 06/15/17 inform will keep same appt as a hosp f/u and she stated she have a f/u appt schedule w/her kidney doctor too on the 18th of January.   Provider Appointment booked with [name]  Confirmed with patient if condition begins to worsen call PCP or go to the ER.  Patient was given the office number and encouraged to call back with question or concerns.  : YES

## 2017-06-15 ENCOUNTER — Inpatient Hospital Stay: Payer: Medicare Other | Admitting: Internal Medicine

## 2017-07-06 ENCOUNTER — Other Ambulatory Visit: Payer: Self-pay | Admitting: Internal Medicine

## 2017-07-15 ENCOUNTER — Telehealth: Payer: Self-pay | Admitting: *Deleted

## 2017-07-15 ENCOUNTER — Other Ambulatory Visit (INDEPENDENT_AMBULATORY_CARE_PROVIDER_SITE_OTHER)

## 2017-07-15 ENCOUNTER — Encounter: Payer: Self-pay | Admitting: Internal Medicine

## 2017-07-15 ENCOUNTER — Ambulatory Visit (INDEPENDENT_AMBULATORY_CARE_PROVIDER_SITE_OTHER): Payer: Medicare Other | Admitting: Internal Medicine

## 2017-07-15 VITALS — BP 120/64 | HR 84 | Temp 97.5°F | Ht 63.0 in | Wt 183.2 lb

## 2017-07-15 DIAGNOSIS — I10 Essential (primary) hypertension: Secondary | ICD-10-CM

## 2017-07-15 DIAGNOSIS — I5031 Acute diastolic (congestive) heart failure: Secondary | ICD-10-CM

## 2017-07-15 DIAGNOSIS — E083299 Diabetes mellitus due to underlying condition with mild nonproliferative diabetic retinopathy without macular edema, unspecified eye: Secondary | ICD-10-CM | POA: Diagnosis not present

## 2017-07-15 LAB — CBC WITH DIFFERENTIAL/PLATELET
BASOS ABS: 0 10*3/uL (ref 0.0–0.1)
Basophils Relative: 0.5 % (ref 0.0–3.0)
Eosinophils Absolute: 0.2 10*3/uL (ref 0.0–0.7)
Eosinophils Relative: 2.5 % (ref 0.0–5.0)
HCT: 36.8 % (ref 36.0–46.0)
Hemoglobin: 11.7 g/dL — ABNORMAL LOW (ref 12.0–15.0)
Lymphocytes Relative: 17.7 % (ref 12.0–46.0)
Lymphs Abs: 1.2 10*3/uL (ref 0.7–4.0)
MCHC: 31.8 g/dL (ref 30.0–36.0)
MCV: 94 fl (ref 78.0–100.0)
MONOS PCT: 8.3 % (ref 3.0–12.0)
Monocytes Absolute: 0.6 10*3/uL (ref 0.1–1.0)
Neutro Abs: 4.9 10*3/uL (ref 1.4–7.7)
Neutrophils Relative %: 71 % (ref 43.0–77.0)
Platelets: 248 10*3/uL (ref 150.0–400.0)
RBC: 3.91 Mil/uL (ref 3.87–5.11)
RDW: 14 % (ref 11.5–15.5)
WBC: 6.9 10*3/uL (ref 4.0–10.5)

## 2017-07-15 LAB — BASIC METABOLIC PANEL
BUN: 137 mg/dL (ref 6–23)
CHLORIDE: 97 meq/L (ref 96–112)
CO2: 22 meq/L (ref 19–32)
CREATININE: 2.64 mg/dL — AB (ref 0.40–1.20)
Calcium: 8.1 mg/dL — ABNORMAL LOW (ref 8.4–10.5)
GFR: 22.19 mL/min — ABNORMAL LOW (ref >60.00)
Glucose, Bld: 284 mg/dL — ABNORMAL HIGH (ref 70–99)
Potassium: 4.3 mEq/L (ref 3.5–5.1)
SODIUM: 136 meq/L (ref 135–145)

## 2017-07-15 MED ORDER — METOLAZONE 2.5 MG PO TABS: ORAL_TABLET | ORAL | 1 refills | Status: DC

## 2017-07-15 NOTE — Patient Instructions (Addendum)
Ok to start the metolazone (extra fluid pill) at 2.5 mg on Mon - Wed - Fridays only  Please check you weight first thing after visiting the bathroom every morning, and write the weights down, and bring to your next appt  Please call in 1 week if you see your weight is not improved by at least 3-5  Lbs  Please continue all other medications as before, and refills have been done if requested.  Please have the pharmacy call with any other refills you may need.  Please keep your appointments with your specialists as you may have planned  Please go to the LAB in the Basement (turn left off the elevator) for the tests to be done today  You will be contacted by phone if any changes need to be made immediately.  Otherwise, you will receive a letter about your results with an explanation, but please check with MyChart first.  Please remember to sign up for MyChart if you have not done so, as this will be important to you in the future with finding out test results, communicating by private email, and scheduling acute appointments online when needed.   Please return in 2 weeks, or sooner if needed

## 2017-07-15 NOTE — Telephone Encounter (Signed)
Ok, this is chronic and actually improved

## 2017-07-15 NOTE — Telephone Encounter (Signed)
Received call from [facility] in the lab w/critical BUN @ 137

## 2017-07-15 NOTE — Progress Notes (Signed)
Subjective:    Patient ID: Michelle Morton, female    DOB: Oct 04, 1931, 84 y.o.   MRN: [id number]  HPI   Here to f/u post hospn for acute on chronic diast chf in the setting of ckd, d/c with lasix 80 bid, but unfortunately has not controlled her d/o as has had wt gain, cough , sob, wheeziness and worsening LE edema, sleeping in a recliner chair to keep elevated at night due to orthopnea, feeling similar to when she presented for hospn.   Pt denies fever, wt loss, night sweats, loss of appetite, or other constitutional symptoms though does have significant generalized weakness as well.    Wt Readings from Last 3 Encounters:  07/15/17 183 lb 4 oz (83.1 kg)  06/08/17 170 lb 1.6 oz (77.2 kg)  06/03/17 182 lb (82.6 kg)  Now under hospice as she has declined dialysis.   Pt denies polydipsia, polyuria. Pt denies new neurological symptoms such as new headache, or facial or extremity weakness or numbness   Past Medical History:  Diagnosis Date  . Allergic rhinitis, cause unspecified 02/14/2013  . Arthritis   . Arthus phenomenon   . Diabetes mellitus, type 2 ([place])   . Hearing loss    right ear, no hearing aid  . History of blood transfusion    [name] - unsure number of units transfused  . Hyperlipidemia   . Hypertension   . Hypothyroidism   . Kidney stones   . Osteoarthritis, knee    knees - otc med prn  . SVD (spontaneous vaginal delivery)    x 4  . Varicose veins    lower legs   Past Surgical History:  Procedure Laterality Date  . ABDOMINAL HYSTERECTOMY    . ANTERIOR AND POSTERIOR REPAIR N/A 04/17/2014   Procedure: ANTERIOR (CYSTOCELE) AND POSTERIOR REPAIR (RECTOCELE);  Surgeon: Cheri Fowler, MD;  Location: [facility];  Service: Gynecology;  Laterality: N/A;  . APPENDECTOMY    . CATARACT EXTRACTION Bilateral   . COLONOSCOPY    . KIDNEY STONE SURGERY     removal of stone  . REPLACEMENT TOTAL KNEE Bilateral   . TONSILLECTOMY    . WISDOM TOOTH EXTRACTION      reports that she quit  smoking about 25 years ago. Her smoking use included cigarettes. She has a 16.00 pack-year smoking history. she has never used smokeless tobacco. She reports that she does not drink alcohol or use drugs. family history includes Breast cancer in her daughter; Diabetes in her brother, father, mother, and sister; Hypertension in her father. Allergies  Allergen Reactions  . Penicillins Rash    Allergic to IV penicillin but tolerates the oral form Has patient had a PCN reaction causing immediate rash, facial/tongue/throat swelling, SOB or lightheadedness with hypotension: no Has patient had a PCN reaction causing severe rash involving mucus membranes or skin necrosis: no Has patient had a PCN reaction that required hospitalization already in the hospital for other procedure  Has patient had a PCN reaction occurring within the last 10 years: no If all of the above answers are "NO", then ma   Current Outpatient Medications on File Prior to Visit  Medication Sig Dispense Refill  . acetaminophen (TYLENOL) 500 MG tablet Take 1,000 mg by mouth at bedtime.    Marland Kitchen amLODipine (NORVASC) 5 MG tablet Take 5 mg by mouth daily.    . Blood Glucose Monitoring Suppl (ONE TOUCH ULTRA 2) w/Device KIT Use as directed once per day 1 each 0  .  cetirizine (ZYRTEC) 10 MG tablet Take 10 mg by mouth daily.    . Cholecalciferol (VITAMIN D3) 50000 units CAPS Take 1 capsule by mouth every 7 (seven) days.    . diclofenac sodium (VOLTAREN) 1 % GEL APPLY 4 GRAMS TOPICALLY FOUR TIMES DAILY as needed (Patient taking differently: Apply 4 g topically 4 (four) times daily as needed (PAIN). ) 400 g 3  . furosemide (LASIX) 80 MG tablet Take 1 tablet (80 mg total) by mouth 2 (two) times daily. 60 tablet 0  . gabapentin (NEURONTIN) 100 MG capsule Take 2 capsules (200 mg total) by mouth at bedtime. 60 capsule 3  . JANUVIA 50 MG tablet TAKE ONE TABLET BY MOUTH ONCE DAILY 90 tablet 2  . Lancets MISC Use as directed once per day 100 each 12  .  levothyroxine (SYNTHROID, LEVOTHROID) 88 MCG tablet TAKE ONE TABLET BY MOUTH ONCE DAILY 90 tablet 0  . lovastatin (MEVACOR) 40 MG tablet TAKE ONE TABLET BY MOUTH AT BEDTIME 90 tablet 0  . ONE TOUCH ULTRA TEST test strip USE AS DIRECTED 50 each 25  . Oxymetazoline HCl (NASAL SPRAY NA) Place 1 spray into the nose 2 (two) times daily as needed (allergies).      No current facility-administered medications on file prior to visit.    Review of Systems  Constitutional: Negative for other unusual diaphoresis or sweats HENT: Negative for ear discharge or swelling Eyes: Negative for other worsening visual disturbances Respiratory: Negative for stridor or other swelling  Gastrointestinal: Negative for worsening distension or other blood Genitourinary: Negative for retention or other urinary change Musculoskeletal: Negative for other MSK pain or swelling Skin: Negative for color change or other new lesions Neurological: Negative for worsening tremors and other numbness  Psychiatric/Behavioral: Negative for worsening agitation or other fatigue All other system neg per pt    Objective:   Physical Exam BP 120/64 (BP Location: Left Arm, Patient Position: Sitting, Cuff Size: Normal)   Pulse 84   Temp (!) 97.5 F (36.4 C) (Oral)   Ht 5' 3" (1.6 m)   Wt 183 lb 4 oz (83.1 kg)   SpO2 98%   BMI 32.46 kg/m  VS noted, weak Constitutional: Pt appears in NAD HENT: Head: NCAT.  Right Ear: External ear normal.  Left Ear: External ear normal.  Eyes: . Pupils are equal, round, and reactive to light. Conjunctivae and EOM are normal Nose: without d/c or deformity Neck: Neck supple. Gross normal ROM Cardiovascular: Normal rate and regular rhythm.   Pulmonary/Chest: Effort normal and breath sounds decreased with few bibasilar rales but now wheezing.  Abd:  Soft, NT, ND, + BS, no organomegaly Neurological: Pt is alert. At baseline orientation, motor grossly intact Skin: Skin is warm. No rashes, other new  lesions, but has bilat LE edema to just below the groin Psychiatric: Pt behavior is normal without agitation  No other exam findings    Assessment & Plan:

## 2017-07-16 NOTE — Assessment & Plan Note (Signed)
stable overall by history and exam, recent data reviewed with pt, and pt to continue medical treatment as before,  to f/u any worsening symptoms or concerns BP Readings from Last 3 Encounters:  07/15/17 120/64  06/08/17 (!) 114/59  06/03/17 (!) 144/70

## 2017-07-16 NOTE — Assessment & Plan Note (Addendum)
BP stable but with marked volume overload and wt gain c/w acute dCHF in the setting of endstage renal dz; will cont lasix 80 bid but add metolazone 2.5 Mon-wed-fri, check daily wts, call if not improved in 1 wk, f/u at 2 wks or sooner if needed, or to ED if any worsening s/s  Note:  Total time for pt hx, exam, review of record with pt in the room, determination of diagnoses and plan for further eval and tx is > 40 min, with over 50% spent in coordination and counseling of patient including the differential dx, tx, further evaluation and other management of acute CHF, DM and HTN

## 2017-07-16 NOTE — Assessment & Plan Note (Signed)
Lab Results  Component Value Date   HGBA1C 6.1 12/10/2016  stable overall by history and exam, recent data reviewed with pt, and pt to continue medical treatment as before,  to f/u any worsening symptoms or concerns

## 2017-07-28 ENCOUNTER — Telehealth: Payer: Self-pay

## 2017-07-28 NOTE — Telephone Encounter (Signed)
A voicemail was left on a phone in our office over the weekend----[facility]---patient has experienced walking problems,pain,swelling, gained 3 lbs since starting new fluid pill---I have left message at [phone] asking patient or Michelle Morton to call back and ask for Gurvir Schrom,RN at [facility], would like to find out exactly what symptoms she is having and make sure of new medication she is talking about---can talk with Veatrice Eckstein,RN at [facility] when someone calls back

## 2017-08-02 ENCOUNTER — Ambulatory Visit (INDEPENDENT_AMBULATORY_CARE_PROVIDER_SITE_OTHER): Admitting: Internal Medicine

## 2017-08-02 ENCOUNTER — Other Ambulatory Visit (INDEPENDENT_AMBULATORY_CARE_PROVIDER_SITE_OTHER)

## 2017-08-02 ENCOUNTER — Encounter: Payer: Self-pay | Admitting: Internal Medicine

## 2017-08-02 VITALS — BP 146/82 | HR 86 | Temp 97.9°F | Ht 63.0 in

## 2017-08-02 DIAGNOSIS — I5031 Acute diastolic (congestive) heart failure: Secondary | ICD-10-CM

## 2017-08-02 DIAGNOSIS — N184 Chronic kidney disease, stage 4 (severe): Secondary | ICD-10-CM | POA: Diagnosis not present

## 2017-08-02 DIAGNOSIS — E1122 Type 2 diabetes mellitus with diabetic chronic kidney disease: Secondary | ICD-10-CM

## 2017-08-02 LAB — BASIC METABOLIC PANEL
BUN: 124 mg/dL — AB (ref 6–23)
CHLORIDE: 99 meq/L (ref 96–112)
CO2: 26 mEq/L (ref 19–32)
CREATININE: 2.71 mg/dL — AB (ref 0.40–1.20)
Calcium: 9.2 mg/dL (ref 8.4–10.5)
GFR: 21.53 mL/min — ABNORMAL LOW (ref >60.00)
Glucose, Bld: 152 mg/dL — ABNORMAL HIGH (ref 70–99)
POTASSIUM: 3.9 meq/L (ref 3.5–5.1)
Sodium: 136 mEq/L (ref 135–145)

## 2017-08-02 MED ORDER — METOLAZONE 5 MG PO TABS: 5.0000 mg | ORAL_TABLET | Freq: Every day | ORAL | 3 refills | Status: DC

## 2017-08-02 NOTE — Patient Instructions (Signed)
OK to increase the metolazone to 5 mg every day  Please continue to check your weight every day, write them down, and bring to next appt  Please continue all other medications as before, and refills have been done if requested.  Please have the pharmacy call with any other refills you may need.  Please keep your appointments with your specialists as you may have planned - Renal in April 2019  Please go to the LAB in the Basement (turn left off the elevator) for the tests to be done today  You will be contacted by phone if any changes need to be made immediately.  Otherwise, you will receive a letter about your results with an explanation, but please check with MyChart first.  Please remember to sign up for MyChart if you have not done so, as this will be important to you in the future with finding out test results, communicating by private email, and scheduling acute appointments online when needed.  Please return in 1 week, or sooner if needed

## 2017-08-02 NOTE — Progress Notes (Signed)
Subjective:    Patient ID: Michelle Morton, female    DOB: 1932/02/24, 84 y.o.   MRN: [id number]  HPI  Here to f/u with c/o worsening LE swelling, and getting weaker from up to the BR so much with walker, and heavy legs and sob, maybe a few wheezes.  Wt has not reduced, despite good compliance with all diuretics.  Pt denies chest pain, orthopnea, PND, palpitations, dizziness or syncope.   Pt denies fever, wt loss, night sweats, loss of appetite, or other constitutional symptoms   Pt denies polydipsia, polyuria  Wt Readings from Last 3 Encounters:  07/15/17 183 lb 4 oz (83.1 kg)  06/08/17 170 lb 1.6 oz (77.2 kg)  06/03/17 182 lb (82.6 kg)   Past Medical History:  Diagnosis Date  . Allergic rhinitis, cause unspecified 02/14/2013  . Arthritis   . Arthus phenomenon   . Diabetes mellitus, type 2 ([place])   . Hearing loss    right ear, no hearing aid  . History of blood transfusion    [name] - unsure number of units transfused  . Hyperlipidemia   . Hypertension   . Hypothyroidism   . Kidney stones   . Osteoarthritis, knee    knees - otc med prn  . SVD (spontaneous vaginal delivery)    x 4  . Varicose veins    lower legs   Past Surgical History:  Procedure Laterality Date  . ABDOMINAL HYSTERECTOMY    . ANTERIOR AND POSTERIOR REPAIR N/A 04/17/2014   Procedure: ANTERIOR (CYSTOCELE) AND POSTERIOR REPAIR (RECTOCELE);  Surgeon: Cheri Fowler, MD;  Location: [facility];  Service: Gynecology;  Laterality: N/A;  . APPENDECTOMY    . CATARACT EXTRACTION Bilateral   . COLONOSCOPY    . KIDNEY STONE SURGERY     removal of stone  . REPLACEMENT TOTAL KNEE Bilateral   . TONSILLECTOMY    . WISDOM TOOTH EXTRACTION      reports that she quit smoking about 25 years ago. Her smoking use included cigarettes. She has a 16.00 pack-year smoking history. she has never used smokeless tobacco. She reports that she does not drink alcohol or use drugs. family history includes Breast cancer in her daughter;  Diabetes in her brother, father, mother, and sister; Hypertension in her father. Allergies  Allergen Reactions  . Penicillins Rash    Allergic to IV penicillin but tolerates the oral form Has patient had a PCN reaction causing immediate rash, facial/tongue/throat swelling, SOB or lightheadedness with hypotension: no Has patient had a PCN reaction causing severe rash involving mucus membranes or skin necrosis: no Has patient had a PCN reaction that required hospitalization already in the hospital for other procedure  Has patient had a PCN reaction occurring within the last 10 years: no If all of the above answers are "NO", then ma   Current Outpatient Medications on File Prior to Visit  Medication Sig Dispense Refill  . acetaminophen (TYLENOL) 500 MG tablet Take 1,000 mg by mouth at bedtime.    Marland Kitchen amLODipine (NORVASC) 5 MG tablet Take 5 mg by mouth daily.    . Blood Glucose Monitoring Suppl (ONE TOUCH ULTRA 2) w/Device KIT Use as directed once per day 1 each 0  . cetirizine (ZYRTEC) 10 MG tablet Take 10 mg by mouth daily.    . Cholecalciferol (VITAMIN D3) 50000 units CAPS Take 1 capsule by mouth every 7 (seven) days.    . diclofenac sodium (VOLTAREN) 1 % GEL APPLY 4 GRAMS TOPICALLY FOUR  TIMES DAILY as needed (Patient taking differently: Apply 4 g topically 4 (four) times daily as needed (PAIN). ) 400 g 3  . furosemide (LASIX) 80 MG tablet Take 1 tablet (80 mg total) by mouth 2 (two) times daily. 60 tablet 0  . gabapentin (NEURONTIN) 100 MG capsule Take 2 capsules (200 mg total) by mouth at bedtime. 60 capsule 3  . JANUVIA 50 MG tablet TAKE ONE TABLET BY MOUTH ONCE DAILY 90 tablet 2  . Lancets MISC Use as directed once per day 100 each 12  . levothyroxine (SYNTHROID, LEVOTHROID) 88 MCG tablet TAKE ONE TABLET BY MOUTH ONCE DAILY 90 tablet 0  . lovastatin (MEVACOR) 40 MG tablet TAKE ONE TABLET BY MOUTH AT BEDTIME 90 tablet 0  . ONE TOUCH ULTRA TEST test strip USE AS DIRECTED 50 each 25  .  Oxymetazoline HCl (NASAL SPRAY NA) Place 1 spray into the nose 2 (two) times daily as needed (allergies).      No current facility-administered medications on file prior to visit.    Review of Systems  Constitutional: Negative for other unusual diaphoresis or sweats HENT: Negative for ear discharge or swelling Eyes: Negative for other worsening visual disturbances Respiratory: Negative for stridor or other swelling  Gastrointestinal: Negative for worsening distension or other blood Genitourinary: Negative for retention or other urinary change Musculoskeletal: Negative for other MSK pain or swelling Skin: Negative for color change or other new lesions Neurological: Negative for worsening tremors and other numbness  Psychiatric/Behavioral: Negative for worsening agitation or other fatigue All other system neg per pt    Objective:   Physical Exam BP (!) 146/82   Pulse 86   Temp 97.9 F (36.6 C) (Oral)   Ht '5\' 3"'$  (1.6 m)   SpO2 96%   BMI 32.46 kg/m  VS noted,  Constitutional: Pt appears in NAD HENT: Head: NCAT.  Right Ear: External ear normal.  Left Ear: External ear normal.  Eyes: . Pupils are equal, round, and reactive to light. Conjunctivae and EOM are normal Nose: without d/c or deformity Neck: Neck supple. Gross normal ROM Cardiovascular: Normal rate and regular rhythm.   Pulmonary/Chest: Effort normal and breath sounds without rales or wheezing.  Neurological: Pt is alert. At baseline orientation, motor grossly intact Skin: Skin is warm. No rashes, other new lesions, 2+ bilat LE edema to just above the knees, calves nontender, no significant skin erythema Psychiatric: Pt behavior is normal without agitation  No other exam findings      Assessment & Plan:

## 2017-08-04 ENCOUNTER — Ambulatory Visit: Payer: Medicare Other | Admitting: Internal Medicine

## 2017-08-05 NOTE — Assessment & Plan Note (Addendum)
Without significant improvement so far with daily lasix and metolazone m-w-f ; ok to cont daily lasix, increase metolazone to 5 qd, for BMP today and daily wts, f/u 1 wk

## 2017-08-05 NOTE — Assessment & Plan Note (Signed)
Lab Results  Component Value Date   HGBA1C 6.1 12/10/2016  stable overall by history and exam, recent data reviewed with pt, and pt to continue medical treatment as before,  to f/u any worsening symptoms or concerns 

## 2017-08-05 NOTE — Assessment & Plan Note (Signed)
stable overall by history and exam, recent data reviewed with pt, and pt to continue medical treatment as before,  to f/u any worsening symptoms or concerns  

## 2017-08-10 ENCOUNTER — Telehealth: Payer: Self-pay | Admitting: Internal Medicine

## 2017-08-10 ENCOUNTER — Ambulatory Visit: Payer: Medicare Other | Admitting: Internal Medicine

## 2017-08-10 NOTE — Telephone Encounter (Signed)
Copied from CRM [id number]. Topic: Quick Communication - See Telephone Encounter >> Aug 10, 2017  8:18 AM Oneal GroutSebastian, Jennifer S wrote: CRM for notification. See Telephone encounter for:  Patient has lost 4lbs with metolazone (ZAROXOLYN) 5 MG tablet, patient had a fall last night EMS was called, is going to discontinue meds, she believes it is making her weak.  08/10/17.

## 2017-08-10 NOTE — Telephone Encounter (Signed)
FYI

## 2017-08-19 ENCOUNTER — Ambulatory Visit: Payer: Self-pay

## 2017-08-19 NOTE — Telephone Encounter (Signed)
Patient's daughter, Mayra ReelSheryl Sevin, called and patient gave verification to speak to her. Sheryl says "she has swelling and is SOB, but this is an ongoing issue. She missed her appointment on 3/13 and I was calling to schedule it for Monday, if he's open." I asked is the swelling and SOB worse than it was on 3/13, she says "the SOB comes and goes like always, she's just weak and don't feel like going today. The swelling is in her thighs, which is a little worse. She has swelling to her ankles, but I don't think she took her medicine this morning yet. But, it doesn't help the thighs swelling." I asked about pain in the legs, she asked the patient who says "no." I asked about redness to legs, fever, she says "no redness or fever." I asked about other symptoms, she says "her appetite is not that good lately." According to protocol, see PCP within 24 hours, appointment made for Monday, 08/22/17 at 0900 with [name], care advice given, [facility].   Reason for Disposition . [1] MODERATE leg swelling (e.g., swelling extends up to knees) AND [2] new onset or worsening  Answer Assessment - Initial Assessment Questions 1. ONSET: "When did the swelling start?" (e.g., minutes, hours, days)     Always swollen, but a little more than before 2. LOCATION: "What part of the leg is swollen?"  "Are both legs swollen or just one leg?"     Thighs, legs, ankles 3. SEVERITY: "How bad is the swelling?" (e.g., localized; mild, moderate, severe)  - Localized - small area of swelling localized to one leg  - MILD pedal edema - swelling limited to foot and ankle, pitting edema < 1/4 inch (6 mm) deep, rest and elevation eliminate most or all swelling  - MODERATE edema - swelling of lower leg to knee, pitting edema > 1/4 inch (6 mm) deep, rest and elevation only partially reduce swelling  - SEVERE edema - swelling extends above knee, facial or hand swelling present      Severe 4. REDNESS: "Does the swelling  look red or infected?"     No 5. PAIN: "Is the swelling painful to touch?" If so, ask: "How painful is it?"   (Scale 1-10; mild, moderate or severe)     No 6. FEVER: "Do you have a fever?" If so, ask: "What is it, how was it measured, and when did it start?"      No 7. CAUSE: "What do you think is causing the leg swelling?"     Chronic conditions 8. MEDICAL HISTORY: "Do you have a history of heart failure, kidney disease, liver failure, or cancer?"     Yes 9. RECURRENT SYMPTOM: "Have you had leg swelling before?" If so, ask: "When was the last time?" "What happened that time?"     Yes-all the time 10. OTHER SYMPTOMS: "Do you have any other symptoms?" (e.g., chest pain, difficulty breathing)       Appetite not as good 11. PREGNANCY: "Is there any chance you are pregnant?" "When was your last menstrual period?"       No  Protocols used: LEG SWELLING AND EDEMA-A-AH

## 2017-08-22 ENCOUNTER — Ambulatory Visit (INDEPENDENT_AMBULATORY_CARE_PROVIDER_SITE_OTHER): Payer: Medicare Other | Admitting: Internal Medicine

## 2017-08-22 ENCOUNTER — Other Ambulatory Visit (INDEPENDENT_AMBULATORY_CARE_PROVIDER_SITE_OTHER)

## 2017-08-22 ENCOUNTER — Encounter: Payer: Self-pay | Admitting: Internal Medicine

## 2017-08-22 VITALS — BP 144/88 | HR 91 | Ht 63.0 in | Wt 188.0 lb

## 2017-08-22 DIAGNOSIS — H6982 Other specified disorders of Eustachian tube, left ear: Secondary | ICD-10-CM | POA: Insufficient documentation

## 2017-08-22 DIAGNOSIS — E1122 Type 2 diabetes mellitus with diabetic chronic kidney disease: Secondary | ICD-10-CM | POA: Diagnosis not present

## 2017-08-22 DIAGNOSIS — I5031 Acute diastolic (congestive) heart failure: Secondary | ICD-10-CM | POA: Diagnosis not present

## 2017-08-22 LAB — BASIC METABOLIC PANEL
BUN: 122 mg/dL (ref 6–23)
CALCIUM: 9.3 mg/dL (ref 8.4–10.5)
CO2: 27 mEq/L (ref 19–32)
Chloride: 98 mEq/L (ref 96–112)
Creatinine, Ser: 2.8 mg/dL — ABNORMAL HIGH (ref 0.40–1.20)
GFR: 20.73 mL/min — AB (ref >60.00)
Glucose, Bld: 159 mg/dL — ABNORMAL HIGH (ref 70–99)
POTASSIUM: 4 meq/L (ref 3.5–5.1)
SODIUM: 137 meq/L (ref 135–145)

## 2017-08-22 MED ORDER — VITAMIN D3 1.25 MG (50000 UT) PO CAPS: 1.0000 | ORAL_CAPSULE | ORAL | 0 refills | Status: DC

## 2017-08-22 MED ORDER — LEVOTHYROXINE SODIUM 88 MCG PO TABS: 88.0000 ug | ORAL_TABLET | Freq: Every day | ORAL | 0 refills | Status: DC

## 2017-08-22 MED ORDER — METOLAZONE 5 MG PO TABS: 2.5000 mg | ORAL_TABLET | Freq: Every day | ORAL | 3 refills | Status: DC

## 2017-08-22 NOTE — Assessment & Plan Note (Signed)
uanble to tolerate increased diuretic, to cont same tx

## 2017-08-22 NOTE — Assessment & Plan Note (Signed)
Ok for mucinex otc prn,  to f/u any worsening symptoms or concerns 

## 2017-08-22 NOTE — Patient Instructions (Addendum)
Ok to continue the metolazone at 2.5 mg per day in addition to the lasix as you are taking  Please continue all other medications as before, and refills have been done if requested.  Please have the pharmacy call with any other refills you may need.  Please keep your appointments with your specialists as you may have planned  Please go to the LAB in the Basement (turn left off the elevator) for the tests to be done today  You will be contacted by phone if any changes need to be made immediately.  Otherwise, you will receive a letter about your results with an explanation, but please check with MyChart first.  Please remember to sign up for MyChart if you have not done so, as this will be important to you in the future with finding out test results, communicating by private email, and scheduling acute appointments online when needed.

## 2017-08-22 NOTE — Progress Notes (Signed)
Subjective:    Patient ID: Michelle Morton, female    DOB: [birth year], 84 y.o.   MRN: [id number]  HPI  Here to f/u diastolic chf; was advised per hospice MD to reduce the metolazone to 2.5 mg in addition to the lasix 80 bid "b/c it was too much" as she had some off balance and dizziness,and she c/o frequent urination; was advised this on mar 22, and actually did not take the metolazone at all as she was going to a bday party.  Edema seems worse to her and wt is steadily increasing.  Wt Readings from Last 3 Encounters:  08/22/17 188 lb (85.3 kg)  07/15/17 183 lb 4 oz (83.1 kg)  06/08/17 170 lb 1.6 oz (77.2 kg)  Also with recurrent left neck post pain with some radiation to the shoulder but no LUE pain, weakness, numbness. Also with left ear fullness and reduced hearing with popping and crackling. Past Medical History:  Diagnosis Date  . Allergic rhinitis, cause unspecified 02/14/2013  . Arthritis   . Arthus phenomenon   . Diabetes mellitus, type 2 ([place])   . Hearing loss    right ear, no hearing aid  . History of blood transfusion    [name] - unsure number of units transfused  . Hyperlipidemia   . Hypertension   . Hypothyroidism   . Kidney stones   . Osteoarthritis, knee    knees - otc med prn  . SVD (spontaneous vaginal delivery)    x 4  . Varicose veins    lower legs   Past Surgical History:  Procedure Laterality Date  . ABDOMINAL HYSTERECTOMY    . ANTERIOR AND POSTERIOR REPAIR N/A 04/17/2014   Procedure: ANTERIOR (CYSTOCELE) AND POSTERIOR REPAIR (RECTOCELE);  Surgeon: Cheri Fowler, MD;  Location: [facility];  Service: Gynecology;  Laterality: N/A;  . APPENDECTOMY    . CATARACT EXTRACTION Bilateral   . COLONOSCOPY    . KIDNEY STONE SURGERY     removal of stone  . REPLACEMENT TOTAL KNEE Bilateral   . TONSILLECTOMY    . WISDOM TOOTH EXTRACTION      reports that she quit smoking about 25 years ago. Her smoking use included cigarettes. She has a 16.00 pack-year smoking  history. She has never used smokeless tobacco. She reports that she does not drink alcohol or use drugs. family history includes Breast cancer in her daughter; Diabetes in her brother, father, mother, and sister; Hypertension in her father. Allergies  Allergen Reactions  . Penicillins Rash    Allergic to IV penicillin but tolerates the oral form Has patient had a PCN reaction causing immediate rash, facial/tongue/throat swelling, SOB or lightheadedness with hypotension: no Has patient had a PCN reaction causing severe rash involving mucus membranes or skin necrosis: no Has patient had a PCN reaction that required hospitalization already in the hospital for other procedure  Has patient had a PCN reaction occurring within the last 10 years: no If all of the above answers are "NO", then ma   Current Outpatient Medications on File Prior to Visit  Medication Sig Dispense Refill  . acetaminophen (TYLENOL) 500 MG tablet Take 1,000 mg by mouth at bedtime.    Marland Kitchen amLODipine (NORVASC) 5 MG tablet Take 5 mg by mouth daily.    . Blood Glucose Monitoring Suppl (ONE TOUCH ULTRA 2) w/Device KIT Use as directed once per day 1 each 0  . cetirizine (ZYRTEC) 10 MG tablet Take 10 mg by mouth daily.    Marland Kitchen  diclofenac sodium (VOLTAREN) 1 % GEL APPLY 4 GRAMS TOPICALLY FOUR TIMES DAILY as needed (Patient taking differently: Apply 4 g topically 4 (four) times daily as needed (PAIN). ) 400 g 3  . furosemide (LASIX) 80 MG tablet Take 1 tablet (80 mg total) by mouth 2 (two) times daily. 60 tablet 0  . gabapentin (NEURONTIN) 100 MG capsule Take 2 capsules (200 mg total) by mouth at bedtime. 60 capsule 3  . JANUVIA 50 MG tablet TAKE ONE TABLET BY MOUTH ONCE DAILY 90 tablet 2  . Lancets MISC Use as directed once per day 100 each 12  . lovastatin (MEVACOR) 40 MG tablet TAKE ONE TABLET BY MOUTH AT BEDTIME 90 tablet 0  . metolazone (ZAROXOLYN) 5 MG tablet Take 1 tablet (5 mg total) by mouth daily. 90 tablet 3  . ONE TOUCH ULTRA  TEST test strip USE AS DIRECTED 50 each 25  . Oxymetazoline HCl (NASAL SPRAY NA) Place 1 spray into the nose 2 (two) times daily as needed (allergies).      No current facility-administered medications on file prior to visit.    Review of Systems  Constitutional: Negative for other unusual diaphoresis or sweats HENT: Negative for ear discharge or swelling Eyes: Negative for other worsening visual disturbances Respiratory: Negative for stridor or other swelling  Gastrointestinal: Negative for worsening distension or other blood Genitourinary: Negative for retention or other urinary change Musculoskeletal: Negative for other MSK pain or swelling Skin: Negative for color change or other new lesions Neurological: Negative for worsening tremors and other numbness  Psychiatric/Behavioral: Negative for worsening agitation or other fatigue All other system neg per pt    Objective:   Physical Exam BP (!) 144/88   Pulse 91   Ht '5\' 3"'$  (1.6 m)   Wt 188 lb (85.3 kg)   SpO2 98%   BMI 33.30 kg/m  VS noted,  Constitutional: Pt appears in NAD HENT: Head: NCAT.  Right Ear: External ear normal.  Left Ear: External ear normal., left TM mild erythema with post fluid  Eyes: . Pupils are equal, round, and reactive to light. Conjunctivae and EOM are normal Nose: without d/c or deformity Neck: Neck supple. Gross normal ROM Cardiovascular: Normal rate and regular rhythm.   Pulmonary/Chest: Effort normal and breath sounds without rales or wheezing.  Abd:  Soft, NT, ND, + BS, no organomegaly Neurological: Pt is alert. At baseline orientation, motor grossly intact Skin: Skin is warm. No rashes, other new lesions, 1-2+ bilat LE edema Psychiatric: Pt behavior is normal without agitation  No other exam findings  Transthoracic Echocardiography 05/07/2017  Summary-  Study Conclusions  - Left ventricle: The cavity size was normal. Wall thickness was   normal. Systolic function was normal. The  estimated ejection   fraction was in the range of 60% to 65%. Doppler parameters are   consistent with abnormal left ventricular relaxation (grade 1   diastolic dysfunction). - Mitral valve: Calcified annulus. Mildly thickened leaflets . - Left atrium: The atrium was mildly dilated. - Tricuspid valve: There was mild-moderate regurgitation. - Pulmonary arteries: PA peak pressure: 38 mm Hg (S).    Assessment & Plan:

## 2017-08-22 NOTE — Assessment & Plan Note (Signed)
Lab Results  Component Value Date   HGBA1C 6.1 12/10/2016  stable overall by history and exam, recent data reviewed with pt, and pt to continue medical treatment as before,  to f/u any worsening symptoms or concerns 

## 2017-08-24 ENCOUNTER — Telehealth: Payer: Self-pay | Admitting: Internal Medicine

## 2017-08-24 NOTE — Telephone Encounter (Signed)
Spoke with Michelle Morton regarding AWV. Patient stated that she would like a call back sometime in May 2019 to schedule wellness visit. SF.

## 2017-08-26 ENCOUNTER — Emergency Department (HOSPITAL_COMMUNITY)

## 2017-08-26 ENCOUNTER — Inpatient Hospital Stay (HOSPITAL_COMMUNITY)
Admission: EM | Admit: 2017-08-26 | Discharge: 2017-08-28 | DRG: 291 | Disposition: A | Discharge status: Hospice | Attending: Family Medicine | Admitting: Family Medicine

## 2017-08-26 ENCOUNTER — Telehealth: Payer: Self-pay | Admitting: Internal Medicine

## 2017-08-26 ENCOUNTER — Other Ambulatory Visit: Payer: Self-pay

## 2017-08-26 ENCOUNTER — Encounter (HOSPITAL_COMMUNITY): Payer: Self-pay | Admitting: Emergency Medicine

## 2017-08-26 DIAGNOSIS — M170 Bilateral primary osteoarthritis of knee: Secondary | ICD-10-CM | POA: Diagnosis present

## 2017-08-26 DIAGNOSIS — R600 Localized edema: Secondary | ICD-10-CM | POA: Diagnosis not present

## 2017-08-26 DIAGNOSIS — D631 Anemia in chronic kidney disease: Secondary | ICD-10-CM | POA: Diagnosis present

## 2017-08-26 DIAGNOSIS — E785 Hyperlipidemia, unspecified: Secondary | ICD-10-CM

## 2017-08-26 DIAGNOSIS — E1129 Type 2 diabetes mellitus with other diabetic kidney complication: Secondary | ICD-10-CM | POA: Diagnosis present

## 2017-08-26 DIAGNOSIS — J301 Allergic rhinitis due to pollen: Secondary | ICD-10-CM

## 2017-08-26 DIAGNOSIS — Z87891 Personal history of nicotine dependence: Secondary | ICD-10-CM

## 2017-08-26 DIAGNOSIS — Z880 Allergy status to penicillin: Secondary | ICD-10-CM

## 2017-08-26 DIAGNOSIS — Z9115 Patient's noncompliance with renal dialysis: Secondary | ICD-10-CM

## 2017-08-26 DIAGNOSIS — I5030 Unspecified diastolic (congestive) heart failure: Secondary | ICD-10-CM | POA: Diagnosis not present

## 2017-08-26 DIAGNOSIS — N184 Chronic kidney disease, stage 4 (severe): Secondary | ICD-10-CM | POA: Diagnosis present

## 2017-08-26 DIAGNOSIS — Z7984 Long term (current) use of oral hypoglycemic drugs: Secondary | ICD-10-CM

## 2017-08-26 DIAGNOSIS — I5031 Acute diastolic (congestive) heart failure: Secondary | ICD-10-CM | POA: Diagnosis present

## 2017-08-26 DIAGNOSIS — Z96653 Presence of artificial knee joint, bilateral: Secondary | ICD-10-CM | POA: Diagnosis present

## 2017-08-26 DIAGNOSIS — I5032 Chronic diastolic (congestive) heart failure: Secondary | ICD-10-CM

## 2017-08-26 DIAGNOSIS — I10 Essential (primary) hypertension: Secondary | ICD-10-CM | POA: Diagnosis present

## 2017-08-26 DIAGNOSIS — E1159 Type 2 diabetes mellitus with other circulatory complications: Secondary | ICD-10-CM

## 2017-08-26 DIAGNOSIS — I5033 Acute on chronic diastolic (congestive) heart failure: Secondary | ICD-10-CM | POA: Diagnosis present

## 2017-08-26 DIAGNOSIS — H9190 Unspecified hearing loss, unspecified ear: Secondary | ICD-10-CM | POA: Diagnosis present

## 2017-08-26 DIAGNOSIS — I951 Orthostatic hypotension: Secondary | ICD-10-CM | POA: Diagnosis not present

## 2017-08-26 DIAGNOSIS — E1122 Type 2 diabetes mellitus with diabetic chronic kidney disease: Secondary | ICD-10-CM | POA: Diagnosis present

## 2017-08-26 DIAGNOSIS — I130 Hypertensive heart and chronic kidney disease with heart failure and stage 1 through stage 4 chronic kidney disease, or unspecified chronic kidney disease: Principal | ICD-10-CM | POA: Diagnosis present

## 2017-08-26 DIAGNOSIS — E7870 Disorder of bile acid and cholesterol metabolism, unspecified: Secondary | ICD-10-CM

## 2017-08-26 DIAGNOSIS — Z79899 Other long term (current) drug therapy: Secondary | ICD-10-CM

## 2017-08-26 DIAGNOSIS — Z66 Do not resuscitate: Secondary | ICD-10-CM | POA: Diagnosis present

## 2017-08-26 DIAGNOSIS — N179 Acute kidney failure, unspecified: Secondary | ICD-10-CM

## 2017-08-26 DIAGNOSIS — I472 Ventricular tachycardia: Secondary | ICD-10-CM | POA: Diagnosis not present

## 2017-08-26 DIAGNOSIS — Z515 Encounter for palliative care: Secondary | ICD-10-CM | POA: Diagnosis present

## 2017-08-26 DIAGNOSIS — I509 Heart failure, unspecified: Secondary | ICD-10-CM

## 2017-08-26 DIAGNOSIS — E039 Hypothyroidism, unspecified: Secondary | ICD-10-CM | POA: Diagnosis present

## 2017-08-26 LAB — COMPREHENSIVE METABOLIC PANEL
ALK PHOS: 58 U/L (ref 38–126)
ALT: 17 U/L (ref 14–54)
AST: 23 U/L (ref 15–41)
Albumin: 3.9 g/dL (ref 3.5–5.0)
Anion gap: 14 (ref 5–15)
BILIRUBIN TOTAL: 0.9 mg/dL (ref 0.3–1.2)
BUN: 141 mg/dL — AB (ref 6–20)
CALCIUM: 8.9 mg/dL (ref 8.9–10.3)
CO2: 25 mmol/L (ref 22–32)
Chloride: 95 mmol/L — ABNORMAL LOW (ref 101–111)
Creatinine, Ser: 2.93 mg/dL — ABNORMAL HIGH (ref 0.44–1.00)
GFR calc Af Amer: 16 mL/min — ABNORMAL LOW (ref >60)
GFR calc non Af Amer: 14 mL/min — ABNORMAL LOW (ref >60)
GLUCOSE: 193 mg/dL — AB (ref 65–99)
POTASSIUM: 4.2 mmol/L (ref 3.5–5.1)
Sodium: 134 mmol/L — ABNORMAL LOW (ref 135–145)
TOTAL PROTEIN: 7.7 g/dL (ref 6.5–8.1)

## 2017-08-26 LAB — CBC
HEMATOCRIT: 37 % (ref 36.0–46.0)
Hemoglobin: 11.4 g/dL — ABNORMAL LOW (ref 12.0–15.0)
MCH: 28.9 pg (ref 26.0–34.0)
MCHC: 30.8 g/dL (ref 30.0–36.0)
MCV: 93.7 fL (ref 78.0–100.0)
Platelets: 265 10*3/uL (ref 150–400)
RBC: 3.95 MIL/uL (ref 3.87–5.11)
RDW: 13.7 % (ref 11.5–15.5)
WBC: 6.2 10*3/uL (ref 4.0–10.5)

## 2017-08-26 LAB — BRAIN NATRIURETIC PEPTIDE: B Natriuretic Peptide: 71.9 pg/mL (ref 0.0–100.0)

## 2017-08-26 LAB — I-STAT TROPONIN, ED: Troponin i, poc: 0 ng/mL (ref 0.00–0.08)

## 2017-08-26 NOTE — ED Triage Notes (Signed)
Pt here with c/o sob , hospice wanted pt to come to ED to see if she had \\too  much fluid , pt states that she gets sob when walking

## 2017-08-26 NOTE — Telephone Encounter (Signed)
TC from patient's Hospice RN, Archie Pattenonya. She is at the home at this time. The patient has increased BLE with Lt. Greater than RT. She stated Mrs. Haueter said the edema is worse now then earlier this week. She reported Mrs. Kasal is SOB on exertion-just going to the bathroom.She is also wheezing. The nurse stated her wt on 3/22 was 185 lb. Weight on Monday 3/25 was 188 lb. Advised to seek care at ED. Daughter will drive patient to [facility] ED as soon as she gets off work.

## 2017-08-27 ENCOUNTER — Encounter (HOSPITAL_COMMUNITY): Payer: Self-pay | Admitting: Internal Medicine

## 2017-08-27 ENCOUNTER — Other Ambulatory Visit: Payer: Self-pay

## 2017-08-27 DIAGNOSIS — Z79899 Other long term (current) drug therapy: Secondary | ICD-10-CM | POA: Diagnosis not present

## 2017-08-27 DIAGNOSIS — D631 Anemia in chronic kidney disease: Secondary | ICD-10-CM | POA: Diagnosis present

## 2017-08-27 DIAGNOSIS — E785 Hyperlipidemia, unspecified: Secondary | ICD-10-CM | POA: Diagnosis present

## 2017-08-27 DIAGNOSIS — M170 Bilateral primary osteoarthritis of knee: Secondary | ICD-10-CM | POA: Diagnosis present

## 2017-08-27 DIAGNOSIS — I5031 Acute diastolic (congestive) heart failure: Secondary | ICD-10-CM

## 2017-08-27 DIAGNOSIS — E1122 Type 2 diabetes mellitus with diabetic chronic kidney disease: Secondary | ICD-10-CM | POA: Diagnosis present

## 2017-08-27 DIAGNOSIS — I509 Heart failure, unspecified: Secondary | ICD-10-CM

## 2017-08-27 DIAGNOSIS — Z880 Allergy status to penicillin: Secondary | ICD-10-CM | POA: Diagnosis not present

## 2017-08-27 DIAGNOSIS — E1121 Type 2 diabetes mellitus with diabetic nephropathy: Secondary | ICD-10-CM

## 2017-08-27 DIAGNOSIS — I951 Orthostatic hypotension: Secondary | ICD-10-CM | POA: Diagnosis not present

## 2017-08-27 DIAGNOSIS — Z66 Do not resuscitate: Secondary | ICD-10-CM | POA: Diagnosis present

## 2017-08-27 DIAGNOSIS — Z7984 Long term (current) use of oral hypoglycemic drugs: Secondary | ICD-10-CM | POA: Diagnosis not present

## 2017-08-27 DIAGNOSIS — I130 Hypertensive heart and chronic kidney disease with heart failure and stage 1 through stage 4 chronic kidney disease, or unspecified chronic kidney disease: Secondary | ICD-10-CM | POA: Diagnosis present

## 2017-08-27 DIAGNOSIS — I5033 Acute on chronic diastolic (congestive) heart failure: Secondary | ICD-10-CM | POA: Diagnosis present

## 2017-08-27 DIAGNOSIS — I472 Ventricular tachycardia: Secondary | ICD-10-CM | POA: Diagnosis not present

## 2017-08-27 DIAGNOSIS — Z515 Encounter for palliative care: Secondary | ICD-10-CM | POA: Diagnosis present

## 2017-08-27 DIAGNOSIS — Z87891 Personal history of nicotine dependence: Secondary | ICD-10-CM | POA: Diagnosis not present

## 2017-08-27 DIAGNOSIS — H9190 Unspecified hearing loss, unspecified ear: Secondary | ICD-10-CM | POA: Diagnosis present

## 2017-08-27 DIAGNOSIS — E039 Hypothyroidism, unspecified: Secondary | ICD-10-CM | POA: Diagnosis present

## 2017-08-27 DIAGNOSIS — R600 Localized edema: Secondary | ICD-10-CM | POA: Diagnosis present

## 2017-08-27 DIAGNOSIS — N184 Chronic kidney disease, stage 4 (severe): Secondary | ICD-10-CM | POA: Diagnosis present

## 2017-08-27 DIAGNOSIS — Z96653 Presence of artificial knee joint, bilateral: Secondary | ICD-10-CM | POA: Diagnosis present

## 2017-08-27 DIAGNOSIS — Z9115 Patient's noncompliance with renal dialysis: Secondary | ICD-10-CM | POA: Diagnosis not present

## 2017-08-27 LAB — CBC
HEMATOCRIT: 35.3 % — AB (ref 36.0–46.0)
Hemoglobin: 10.8 g/dL — ABNORMAL LOW (ref 12.0–15.0)
MCH: 28.6 pg (ref 26.0–34.0)
MCHC: 30.6 g/dL (ref 30.0–36.0)
MCV: 93.4 fL (ref 78.0–100.0)
PLATELETS: 264 10*3/uL (ref 150–400)
RBC: 3.78 MIL/uL — ABNORMAL LOW (ref 3.87–5.11)
RDW: 13.8 % (ref 11.5–15.5)
WBC: 6.6 10*3/uL (ref 4.0–10.5)

## 2017-08-27 LAB — CBG MONITORING, ED
GLUCOSE-CAPILLARY: 172 mg/dL — AB (ref 65–99)
Glucose-Capillary: 183 mg/dL — ABNORMAL HIGH (ref 65–99)
Glucose-Capillary: 68 mg/dL (ref 65–99)

## 2017-08-27 LAB — GLUCOSE, CAPILLARY
Glucose-Capillary: 261 mg/dL — ABNORMAL HIGH (ref 65–99)
Glucose-Capillary: 107 mg/dL — ABNORMAL HIGH (ref 65–99)

## 2017-08-27 LAB — CREATININE, SERUM
Creatinine, Ser: 3.08 mg/dL — ABNORMAL HIGH (ref 0.44–1.00)
GFR calc Af Amer: 15 mL/min — ABNORMAL LOW (ref >60)
GFR calc non Af Amer: 13 mL/min — ABNORMAL LOW (ref >60)

## 2017-08-27 MED ORDER — HEPARIN SODIUM (PORCINE) 5000 UNIT/ML IJ SOLN
5000.0000 [IU] | Freq: Three times a day (TID) | INTRAMUSCULAR | Status: DC
  Administered 2017-08-27: 5000 [IU] via SUBCUTANEOUS
  Filled 2017-08-27 (×2): qty 1

## 2017-08-27 MED ORDER — FUROSEMIDE 10 MG/ML IJ SOLN
80.0000 mg | Freq: Once | INTRAMUSCULAR | Status: AC
  Administered 2017-08-27: 80 mg via INTRAVENOUS
  Filled 2017-08-27: qty 8

## 2017-08-27 MED ORDER — PRAVASTATIN SODIUM 40 MG PO TABS
40.0000 mg | ORAL_TABLET | Freq: Every day | ORAL | Status: DC
  Administered 2017-08-27: 40 mg via ORAL
  Filled 2017-08-27: qty 1

## 2017-08-27 MED ORDER — GUAIFENESIN ER 600 MG PO TB12
1200.0000 mg | ORAL_TABLET | Freq: Every day | ORAL | Status: DC
  Administered 2017-08-27 – 2017-08-28 (×2): 1200 mg via ORAL
  Filled 2017-08-27 (×2): qty 2

## 2017-08-27 MED ORDER — AMLODIPINE BESYLATE 5 MG PO TABS
5.0000 mg | ORAL_TABLET | Freq: Every day | ORAL | Status: DC
  Administered 2017-08-27 – 2017-08-28 (×2): 5 mg via ORAL
  Filled 2017-08-27 (×2): qty 1

## 2017-08-27 MED ORDER — ACETAMINOPHEN 325 MG PO TABS
650.0000 mg | ORAL_TABLET | Freq: Four times a day (QID) | ORAL | Status: DC | PRN
  Administered 2017-08-27: 650 mg via ORAL
  Filled 2017-08-27 (×2): qty 2

## 2017-08-27 MED ORDER — GABAPENTIN 100 MG PO CAPS
200.0000 mg | ORAL_CAPSULE | Freq: Every day | ORAL | Status: DC
  Administered 2017-08-27: 200 mg via ORAL
  Filled 2017-08-27: qty 2

## 2017-08-27 MED ORDER — LINAGLIPTIN 5 MG PO TABS
5.0000 mg | ORAL_TABLET | Freq: Every day | ORAL | Status: DC
  Administered 2017-08-27 – 2017-08-28 (×2): 5 mg via ORAL
  Filled 2017-08-27 (×4): qty 1

## 2017-08-27 MED ORDER — FUROSEMIDE 10 MG/ML IJ SOLN
80.0000 mg | Freq: Three times a day (TID) | INTRAMUSCULAR | Status: DC
  Administered 2017-08-27 – 2017-08-28 (×4): 80 mg via INTRAVENOUS
  Filled 2017-08-27 (×4): qty 8

## 2017-08-27 MED ORDER — LEVOTHYROXINE SODIUM 88 MCG PO TABS
88.0000 ug | ORAL_TABLET | Freq: Every day | ORAL | Status: DC
  Administered 2017-08-28 (×2): 88 ug via ORAL
  Filled 2017-08-27 (×3): qty 1

## 2017-08-27 MED ORDER — ONDANSETRON HCL 4 MG/2ML IJ SOLN: 4.0000 mg | Freq: Four times a day (QID) | INTRAMUSCULAR | Status: DC | PRN

## 2017-08-27 MED ORDER — INSULIN ASPART 100 UNIT/ML ~~LOC~~ SOLN
0.0000 [IU] | Freq: Three times a day (TID) | SUBCUTANEOUS | Status: DC
  Administered 2017-08-27: 5 [IU] via SUBCUTANEOUS
  Administered 2017-08-27 – 2017-08-28 (×3): 2 [IU] via SUBCUTANEOUS
  Filled 2017-08-27: qty 1

## 2017-08-27 MED ORDER — LORATADINE 10 MG PO TABS
10.0000 mg | ORAL_TABLET | Freq: Every day | ORAL | Status: DC
  Administered 2017-08-27 – 2017-08-28 (×2): 10 mg via ORAL
  Filled 2017-08-27 (×2): qty 1

## 2017-08-27 MED ORDER — METOLAZONE 2.5 MG PO TABS
2.5000 mg | ORAL_TABLET | Freq: Every day | ORAL | Status: DC
  Administered 2017-08-27 – 2017-08-28 (×2): 2.5 mg via ORAL
  Filled 2017-08-27 (×4): qty 1

## 2017-08-27 MED ORDER — ACETAMINOPHEN 650 MG RE SUPP: 650.0000 mg | Freq: Four times a day (QID) | RECTAL | Status: DC | PRN

## 2017-08-27 MED ORDER — ONDANSETRON HCL 4 MG PO TABS: 4.0000 mg | ORAL_TABLET | Freq: Four times a day (QID) | ORAL | Status: DC | PRN

## 2017-08-27 NOTE — Care Management (Signed)
EDCM  reviewed patient's record CM met with patient at bedside confirmed she is active with [facility].  CM will contact [facility].

## 2017-08-27 NOTE — ED Notes (Signed)
Pt received diet tray. 

## 2017-08-27 NOTE — ED Provider Notes (Signed)
[facility] EMERGENCY DEPARTMENT Provider Note   CSN: [id number] Arrival date & time: 08/26/17  1823     History   Chief Complaint Chief Complaint  Patient presents with  . Shortness of Breath    HPI Michelle Morton is a 84 y.o. female.  Patient is an 84 year old female with past medical history of type 2 diabetes, hypertension, high cholesterol, and chronic renal insufficiency.  She presents today for evaluation of weakness, leg swelling, weight gain, and shortness of breath with exertion.  This is worsened over the past several days.  She was admitted in January with a similar presentation during which time she was diuresed.  The patient is refusing hemodialysis and is currently a hospice patient.  She takes both Lasix and metolazone at home.  The history is provided by the patient.  Shortness of Breath  This is a new problem. The average episode lasts 3 days. The problem occurs continuously.The problem has been gradually worsening. Associated symptoms include leg swelling. Pertinent negatives include no fever, no cough, no sputum production and no chest pain.    Past Medical History:  Diagnosis Date  . Allergic rhinitis, cause unspecified 02/14/2013  . Arthritis   . Arthus phenomenon   . Diabetes mellitus, type 2 ([place])   . Hearing loss    right ear, no hearing aid  . History of blood transfusion    [name] - unsure number of units transfused  . Hyperlipidemia   . Hypertension   . Hypothyroidism   . Kidney stones   . Osteoarthritis, knee    knees - otc med prn  . SVD (spontaneous vaginal delivery)    x 4  . Varicose veins    lower legs    Patient Active Problem List   Diagnosis Date Noted  . Eustachian tube dysfunction, left 08/22/2017  . Palliative care encounter   . CHF (congestive heart failure) ([place]) 06/04/2017  . DM (diabetes mellitus), type 2 with renal complications ([place]) 40/98/1191  . Weight gain 06/03/2017  . Acute diastolic CHF  (congestive heart failure) ([place]) 05/07/2017  . Bilateral lower extremity edema 05/06/2017  . Osteoporosis 01/26/2017  . Degenerative disc disease, lumbar 12/29/2016  . Neck pain on left side 12/12/2016  . Leg cramps 06/05/2016  . General weakness 03/04/2016  . Hypoglycemia 12/17/2015  . RLS (restless legs syndrome) 12/17/2015  . Weight loss 12/17/2015  . Back pain 10/16/2015  . Right-sided chest pain 03/27/2015  . Diarrhea 03/27/2015  . Hoarseness 03/27/2015  . Loss of weight 03/27/2015  . Depression 01/07/2015  . Chronic kidney disease (CKD), stage IV (severe) ([place]) 01/07/2015  . AKI (acute kidney injury) ([place]) 10/09/2014  . Uterine prolaps 09/01/2013  . Peripheral edema 02/14/2013  . Allergic rhinitis 02/14/2013  . Obesity, Class II, BMI 35-39.9 05/12/2011  . Preventative health care 05/12/2011  . TINNITUS 01/09/2009  . Essential hypertension 05/05/2007  . Hypothyroidism 02/28/2007  . Diabetes ([place]) 02/28/2007  . Hyperlipidemia 02/28/2007  . Osteoarthrosis, unspecified whether generalized or localized, involving lower leg 02/28/2007    Past Surgical History:  Procedure Laterality Date  . ABDOMINAL HYSTERECTOMY    . ANTERIOR AND POSTERIOR REPAIR N/A 04/17/2014   Procedure: ANTERIOR (CYSTOCELE) AND POSTERIOR REPAIR (RECTOCELE);  Surgeon: Cheri Fowler, MD;  Location: [facility];  Service: Gynecology;  Laterality: N/A;  . APPENDECTOMY    . CATARACT EXTRACTION Bilateral   . COLONOSCOPY    . KIDNEY STONE SURGERY     removal of stone  .  REPLACEMENT TOTAL KNEE Bilateral   . TONSILLECTOMY    . WISDOM TOOTH EXTRACTION       OB History   None      Home Medications    Prior to Admission medications   Medication Sig Start Date End Date Taking? Authorizing Provider  acetaminophen (TYLENOL) 500 MG tablet Take 1,000 mg by mouth at bedtime.    [provider]  amLODipine (NORVASC) 5 MG tablet Take 5 mg by mouth daily.    [provider]  Blood Glucose  Monitoring Suppl (ONE TOUCH ULTRA 2) w/Device KIT Use as directed once per day 02/20/16   Biagio Borg, MD  cetirizine (ZYRTEC) 10 MG tablet Take 10 mg by mouth daily.    [provider]  Cholecalciferol (VITAMIN D3) 50000 units CAPS Take 1 capsule by mouth every 7 (seven) days. 08/22/17   Biagio Borg, MD  diclofenac sodium (VOLTAREN) 1 % GEL APPLY 4 GRAMS TOPICALLY FOUR TIMES DAILY as needed Patient taking differently: Apply 4 g topically 4 (four) times daily as needed (PAIN).  06/04/16   Biagio Borg, MD  furosemide (LASIX) 80 MG tablet Take 1 tablet (80 mg total) by mouth 2 (two) times daily. 06/08/17   Regalado, Belkys A, MD  gabapentin (NEURONTIN) 100 MG capsule Take 2 capsules (200 mg total) by mouth at bedtime. 01/26/17   Lyndal Pulley, DO  JANUVIA 50 MG tablet TAKE ONE TABLET BY MOUTH ONCE DAILY 03/21/17   Biagio Borg, MD  Lancets MISC Use as directed once per day 02/20/16   Biagio Borg, MD  levothyroxine (SYNTHROID, LEVOTHROID) 88 MCG tablet Take 1 tablet (88 mcg total) by mouth daily. 08/22/17   Biagio Borg, MD  lovastatin (MEVACOR) 40 MG tablet TAKE ONE TABLET BY MOUTH AT BEDTIME 07/06/17   Biagio Borg, MD  metolazone (ZAROXOLYN) 5 MG tablet Take 0.5 tablets (2.5 mg total) by mouth daily. 08/22/17   Biagio Borg, MD  ONE TOUCH ULTRA TEST test strip USE AS DIRECTED 02/22/17   Biagio Borg, MD  Oxymetazoline HCl (NASAL SPRAY NA) Place 1 spray into the nose 2 (two) times daily as needed (allergies).     [provider]    Family History Family History  Problem Relation Age of Onset  . Diabetes Mother   . Diabetes Father   . Hypertension Father   . Breast cancer Daughter   . Diabetes Brother        x 1  . Diabetes Sister        x 4  . Colon cancer Neg Hx   . Esophageal cancer Neg Hx   . Pancreatic cancer Neg Hx   . Liver disease Neg Hx   . Kidney disease Neg Hx     Social History Social History   Tobacco Use  . Smoking status: Former Smoker     Packs/day: 1.00    Years: 16.00    Pack years: 16.00    Types: Cigarettes    Last attempt to quit: 05/10/1992    Years since quitting: 25.3  . Smokeless tobacco: Never Used  Substance Use Topics  . Alcohol use: No    Alcohol/week: 0.0 oz  . Drug use: No     Allergies   Penicillins   Review of Systems Review of Systems  Constitutional: Negative for fever.  Respiratory: Positive for shortness of breath. Negative for cough and sputum production.   Cardiovascular: Positive for leg swelling. Negative  for chest pain.  All other systems reviewed and are negative.    Physical Exam Updated Vital Signs BP 122/60   Pulse 84   Temp 97.6 F (36.4 C) (Oral)   Resp 18   SpO2 100%   Physical Exam  Constitutional: She is oriented to person, place, and time. She appears well-developed and well-nourished. No distress.  HENT:  Head: Normocephalic and atraumatic.  Neck: Normal range of motion. Neck supple.  Cardiovascular: Normal rate and regular rhythm. Exam reveals no gallop and no friction rub.  No murmur heard. Pulmonary/Chest: Effort normal and breath sounds normal. No respiratory distress. She has no wheezes.  Abdominal: Soft. Bowel sounds are normal. She exhibits no distension. There is no tenderness.  Musculoskeletal: Normal range of motion.       Right lower leg: She exhibits edema. She exhibits no tenderness.       Left lower leg: She exhibits edema. She exhibits no tenderness.  There is 2-3+ pitting edema of both lower extremities.  Neurological: She is alert and oriented to person, place, and time.  Skin: Skin is warm and dry. She is not diaphoretic.  Nursing note and vitals reviewed.    ED Treatments / Results  Labs (all labs ordered are listed, but only abnormal results are displayed) Labs Reviewed  CBC - Abnormal; Notable for the following components:      Result Value   Hemoglobin 11.4 (*)    All other components within normal limits  COMPREHENSIVE METABOLIC  PANEL - Abnormal; Notable for the following components:   Sodium 134 (*)    Chloride 95 (*)    Glucose, Bld 193 (*)    BUN 141 (*)    Creatinine, Ser 2.93 (*)    GFR calc non Af Amer 14 (*)    GFR calc Af Amer 16 (*)    All other components within normal limits  BRAIN NATRIURETIC PEPTIDE  I-STAT TROPONIN, ED    EKG None  Radiology Dg Chest 2 View  Result Date: 08/26/2017 CLINICAL DATA:  Shortness of breath EXAM: CHEST - 2 VIEW COMPARISON:  Chest radiograph 06/03/2017 FINDINGS: Unchanged mild cardiomegaly. No pulmonary edema. No pleural effusion or pneumothorax. Chronic lower thoracic compression deformity. IMPRESSION: Mild cardiomegaly without pulmonary edema. Electronically Signed   By: Ulyses Jarred M.D.   On: 08/26/2017 19:47    Procedures Procedures (including critical care time)  Medications Ordered in ED Medications  furosemide (LASIX) injection 80 mg (has no administration in time range)     Initial Impression / Assessment and Plan / ED Course  I have reviewed the triage vital signs and the nursing notes.  Pertinent labs & imaging results that were available during my care of the patient were reviewed by me and considered in my medical decision making (see chart for details).  Patient presenting with increased leg swelling over the past several days.  He is also had a weight gain and feels weak and short of breath.  She has a history of chronic renal insufficiency and is currently a hospice patient as she does not want dialysis.  Her workup today reveals an elevated BUN and creatinine which is consistent with her previous studies.  Her BNP is normal and chest x-ray is unremarkable.  She will be given IV Lasix, then admitted to the hospitalist service under the care of Dr. Hal Hope.  Final Clinical Impressions(s) / ED Diagnoses   Final diagnoses:  None    ED Discharge Orders    None  Veryl Speak, MD 08/27/17 Natasha Mead

## 2017-08-27 NOTE — ED Notes (Signed)
ED Provider at bedside. 

## 2017-08-27 NOTE — Progress Notes (Signed)
[age] year old female known history of diastolic heart failure-HTN, anemia chronic disease, DM TY 2, hypothyroid, surgical repair cystocele rectocele 2015  -most recently admitted 1/4-1/9 with acute diastolic heart failure baseline weight 168 pounds on admission 182--At that time nephrology was consulted for goals of care given chronic kidney disease stage IV and patient declined HD and was sent home with hospice and discharged on Lasix 80 twice daily discharge weight was 172 pounds  patient was seen by her primary care physicianSubsequently on 2/15 and found to be at 183 pounds again, follow-up visit noted had not lost weight and much weaker with movement heavy like shortness of breath despite compliance with diuretics prescribed On 3/5-- PCP prescribed metolazone Monday Wednesday Friday at that office visit 5 mg, told to pursue daily weights and hospice I told the patient not to take the metolazone  In the emergency room found to have BUN/creatinine elevated above baseline 141/usual 150/2.7 weight on admission 188 pounds, CXR mild cardiomegaly without pulmonary edema hemoglobin has been stable no white count.  We will call Hospice--->Will ask if she can be managed at hom and will plan for porbable d/c home in am.  I have explained clearly to the patient that we have only a few options to Rx this  Patient confirms comfort is the goal,  reasonable to use high dose diuretics and have Hospice Weigh in and coord

## 2017-08-27 NOTE — ED Notes (Signed)
Pt given 8 ounces of OJ d/t CBG 68

## 2017-08-27 NOTE — H&P (Signed)
History and Physical    Michelle Morton SWN:[id number] DOB: 03/16/1932 DOA: 08/26/2017  PCP: Biagio Borg, MD  Patient coming from: Home.  Chief Complaint: Shortness of breath.  HPI: Michelle Morton is a 84 y.o. female with history of diastolic CHF, chronic kidney disease stage IV, diabetes mellitus type 2, anemia, hypothyroidism presents to the ER with complaints of increasing shortness of breath or lower extremity edema.  Patient symptoms started 5 days ago.  Patient states over the last 2-3 weeks patient gained 17 pounds despite taking Lasix.  Denies any chest pain productive cough fever or chills.  Patient called her PCP and was instructed to come to the ER.  ED Course: In the ER chest x-ray was unremarkable BNP was 71.  On exam patient is short of breath elevated JVD lower extremity edema.  Patient was given Lasix 80 mg IV and admitted for further management of diastolic CHF.  Review of Systems: As per HPI, rest all negative.   Past Medical History:  Diagnosis Date  . Allergic rhinitis, cause unspecified 02/14/2013  . Arthritis   . Arthus phenomenon   . Diabetes mellitus, type 2 ([place])   . Hearing loss    right ear, no hearing aid  . History of blood transfusion    [name] - unsure number of units transfused  . Hyperlipidemia   . Hypertension   . Hypothyroidism   . Kidney stones   . Osteoarthritis, knee    knees - otc med prn  . SVD (spontaneous vaginal delivery)    x 4  . Varicose veins    lower legs    Past Surgical History:  Procedure Laterality Date  . ABDOMINAL HYSTERECTOMY    . ANTERIOR AND POSTERIOR REPAIR N/A 04/17/2014   Procedure: ANTERIOR (CYSTOCELE) AND POSTERIOR REPAIR (RECTOCELE);  Surgeon: Cheri Fowler, MD;  Location: [facility];  Service: Gynecology;  Laterality: N/A;  . APPENDECTOMY    . CATARACT EXTRACTION Bilateral   . COLONOSCOPY    . KIDNEY STONE SURGERY     removal of stone  . REPLACEMENT TOTAL KNEE Bilateral   . TONSILLECTOMY    . WISDOM  TOOTH EXTRACTION       reports that she quit smoking about 25 years ago. Her smoking use included cigarettes. She has a 16.00 pack-year smoking history. She has never used smokeless tobacco. She reports that she does not drink alcohol or use drugs.  Allergies  Allergen Reactions  . Penicillins Rash    Allergic to IV penicillin but tolerates the oral form Has patient had a PCN reaction causing immediate rash, facial/tongue/throat swelling, SOB or lightheadedness with hypotension: no Has patient had a PCN reaction causing severe rash involving mucus membranes or skin necrosis: no Has patient had a PCN reaction that required hospitalization already in the hospital for other procedure  Has patient had a PCN reaction occurring within the last 10 years: no If all of the above answers are "NO", then ma    Family History  Problem Relation Age of Onset  . Diabetes Mother   . Diabetes Father   . Hypertension Father   . Breast cancer Daughter   . Diabetes Brother        x 1  . Diabetes Sister        x 4  . Colon cancer Neg Hx   . Esophageal cancer Neg Hx   . Pancreatic cancer Neg Hx   . Liver disease Neg Hx   .  Kidney disease Neg Hx     Prior to Admission medications   Medication Sig Start Date End Date Taking? Authorizing Provider  acetaminophen (TYLENOL) 500 MG tablet Take 1,000 mg by mouth daily as needed for mild pain.    Yes [provider]  amLODipine (NORVASC) 5 MG tablet Take 5 mg by mouth daily.   Yes [provider]  Blood Glucose Monitoring Suppl (ONE TOUCH ULTRA 2) w/Device KIT Use as directed once per day 02/20/16  Yes Biagio Borg, MD  cetirizine (ZYRTEC) 10 MG tablet Take 10 mg by mouth daily as needed for allergies.    Yes [provider]  Cholecalciferol (VITAMIN D3) 50000 units CAPS Take 1 capsule by mouth every 7 (seven) days. 08/22/17  Yes Biagio Borg, MD  diclofenac sodium (VOLTAREN) 1 % GEL APPLY 4 GRAMS TOPICALLY FOUR TIMES DAILY as  needed Patient taking differently: Apply 4 g topically 4 (four) times daily as needed (PAIN).  06/04/16  Yes Biagio Borg, MD  furosemide (LASIX) 80 MG tablet Take 1 tablet (80 mg total) by mouth 2 (two) times daily. 06/08/17  Yes Regalado, Belkys A, MD  gabapentin (NEURONTIN) 100 MG capsule Take 2 capsules (200 mg total) by mouth at bedtime. 01/26/17  Yes Lyndal Pulley, DO  guaiFENesin (MUCINEX) 600 MG 12 hr tablet Take 1,200 mg by mouth daily.   Yes [provider]  JANUVIA 50 MG tablet TAKE ONE TABLET BY MOUTH ONCE DAILY 03/21/17  Yes Biagio Borg, MD  Lancets MISC Use as directed once per day 02/20/16  Yes Biagio Borg, MD  levothyroxine (SYNTHROID, LEVOTHROID) 88 MCG tablet Take 1 tablet (88 mcg total) by mouth daily. 08/22/17  Yes Biagio Borg, MD  lovastatin (MEVACOR) 40 MG tablet TAKE ONE TABLET BY MOUTH AT BEDTIME 07/06/17  Yes Biagio Borg, MD  metolazone (ZAROXOLYN) 5 MG tablet Take 0.5 tablets (2.5 mg total) by mouth daily. 08/22/17  Yes Biagio Borg, MD  ONE TOUCH ULTRA TEST test strip USE AS DIRECTED 02/22/17  Yes Biagio Borg, MD  Oxymetazoline HCl (NASAL SPRAY NA) Place 1 spray into the nose 2 (two) times daily as needed (allergies).    Yes [provider]    Physical Exam: Vitals:   08/26/17 1830 08/27/17 0300  BP: 122/60 (!) 118/57  Pulse: 84 80  Resp: 18 13  Temp: 97.6 F (36.4 C)   TempSrc: Oral   SpO2: 100% 100%      Constitutional: Moderately built and nourished. Vitals:   08/26/17 1830 08/27/17 0300  BP: 122/60 (!) 118/57  Pulse: 84 80  Resp: 18 13  Temp: 97.6 F (36.4 C)   TempSrc: Oral   SpO2: 100% 100%   Eyes: Anicteric no pallor. ENMT: No discharge from the ears eyes nose or mouth. Neck: No mass felt.  No neck rigidity.  JVD elevated. Respiratory: No rhonchi mild crepitations. Cardiovascular: S1-S2 heard no murmurs appreciated. Abdomen: Soft nontender bowel sounds present. Musculoskeletal: Bilateral lower extremity edema. Skin:  No rash. Neurologic: Alert awake oriented to time place and person.  Moves all extremities. Psychiatric: Appears normal.  Normal affect.   Labs on Admission: I have personally reviewed following labs and imaging studies  CBC: Recent Labs  Lab 08/26/17 1859  WBC 6.2  HGB 11.4*  HCT 37.0  MCV 93.7  PLT 093   Basic Metabolic Panel: Recent Labs  Lab 08/22/17 0953 08/26/17 1859  NA 137 134*  K 4.0 4.2  CL 98 95*  CO2 27 25  GLUCOSE 159* 193*  BUN 122* 141*  CREATININE 2.80* 2.93*  CALCIUM 9.3 8.9   GFR: Estimated Creatinine Clearance: 15.1 mL/min (A) (by C-G formula based on SCr of 2.93 mg/dL (H)). Liver Function Tests: Recent Labs  Lab 08/26/17 1859  AST 23  ALT 17  ALKPHOS 58  BILITOT 0.9  PROT 7.7  ALBUMIN 3.9   No results for input(s): LIPASE, AMYLASE in the last 168 hours. No results for input(s): AMMONIA in the last 168 hours. Coagulation Profile: No results for input(s): INR, PROTIME in the last 168 hours. Cardiac Enzymes: No results for input(s): CKTOTAL, CKMB, CKMBINDEX, TROPONINI in the last 168 hours. BNP (last 3 results) No results for input(s): PROBNP in the last 8760 hours. HbA1C: No results for input(s): HGBA1C in the last 72 hours. CBG: No results for input(s): GLUCAP in the last 168 hours. Lipid Profile: No results for input(s): CHOL, HDL, LDLCALC, TRIG, CHOLHDL, LDLDIRECT in the last 72 hours. Thyroid Function Tests: No results for input(s): TSH, T4TOTAL, FREET4, T3FREE, THYROIDAB in the last 72 hours. Anemia Panel: No results for input(s): VITAMINB12, FOLATE, FERRITIN, TIBC, IRON, RETICCTPCT in the last 72 hours. Urine analysis:    Component Value Date/Time   COLORURINE STRAW (A) 05/06/2017 2245   APPEARANCEUR CLEAR 05/06/2017 2245   LABSPEC 1.009 05/06/2017 2245   PHURINE 6.0 05/06/2017 2245   GLUCOSEU NEGATIVE 05/06/2017 2245   GLUCOSEU NEGATIVE 10/16/2015 1436   HGBUR NEGATIVE 05/06/2017 2245   BILIRUBINUR NEGATIVE 05/06/2017  2245   KETONESUR NEGATIVE 05/06/2017 2245   PROTEINUR NEGATIVE 05/06/2017 2245   UROBILINOGEN 0.2 10/16/2015 1436   NITRITE NEGATIVE 05/06/2017 2245   LEUKOCYTESUR NEGATIVE 05/06/2017 2245   Sepsis Labs: '@LABRCNTIP'$ (procalcitonin:4,lacticidven:4) )No results found for this or any previous visit (from the past 240 hour(s)).   Radiological Exams on Admission: Dg Chest 2 View  Result Date: 08/26/2017 CLINICAL DATA:  Shortness of breath EXAM: CHEST - 2 VIEW COMPARISON:  Chest radiograph 06/03/2017 FINDINGS: Unchanged mild cardiomegaly. No pulmonary edema. No pleural effusion or pneumothorax. Chronic lower thoracic compression deformity. IMPRESSION: Mild cardiomegaly without pulmonary edema. Electronically Signed   By: Ulyses Jarred M.D.   On: 08/26/2017 19:47    EKG: Independently reviewed.  Normal sinus rhythm.  Assessment/Plan Principal Problem:   Acute diastolic CHF (congestive heart failure) (HCC) Active Problems:   Hypothyroidism   Essential hypertension   CHF (congestive heart failure) (HCC)   DM (diabetes mellitus), type 2 with renal complications (HCC)   Acute CHF (congestive heart failure) ([place])    1. Acute on chronic diastolic CHF last EF measured in May 07, 2017 was 60-65% with grade 1 diastolic dysfunction -patient has been placed on Lasix 80 mg IV every 8.  Patient is also on metolazone.  Follow metabolic panel daily weights intake output. 2. Diabetes mellitus type 2 on Tradjenta. 3. Chronic kidney disease stage IV -creatinine appears to be at baseline.  Follow metabolic panel. 4. Hypothyroidism on Synthroid. 5. Anemia secondary to chronic kidney disease -follow CBC. 6. Hypertension on amlodipine.   DVT prophylaxis: Heparin. Code Status: DNR. Family Communication: Family at the bedside. Disposition Plan: Home. Consults called: None. Admission status: Inpatient.   Rise Patience MD [facility] Pager [phone].  If 7PM-7AM, please contact  night-coverage [email or web address] Password [email or web address]  08/27/2017, 4:19 AM

## 2017-08-27 NOTE — ED Notes (Signed)
Hospice Rn with pt in room, aware of plan for admit

## 2017-08-27 NOTE — ED Notes (Addendum)
Lasix due at 10:40 d/t frequency of 8 hrs, pt breakfast heated up and pt provided coffee, will continue to monitor

## 2017-08-27 NOTE — ED Notes (Signed)
Lunch tray ordered 

## 2017-08-27 NOTE — Progress Notes (Addendum)
MC --  3E17    [facility] ([facility] GIP RN Visit @ 1100 am (in ED)  This is a related and covered GIP admission of 08/27/17 with [facility] diagnosis of Diastolic Heart Failure per Dr. Smith MinceMazzocchi.  Earlier in the week patient reported to hospice MD and primary care Dr. Oliver BarreJames [name] that she did not feel she was tolerating increase in medications for diuresis. She was advised to decrease metolazone from 5 mg to 2.5 mg po daily and continue Lasix 80 mg BID on Monday 08/22/17. Throughout the week  pt had increasing SOB with activity, BLE edema and a 3 lb weight gain. Hospice RN Edd Fabiananya Troutman evaluated patient in the home 08/26/17, notified Dr. Oliver BarreJames [name] of above and pt was advised to go to the ED.   Admitting diagnosis: Acute diastolic CHF  Day 1 of GIP  Received phone call from Dr. Gloriann LoanSantani, notifying of admission and plan for IV diuresis. Visited with patient in the room, who does not have any complaints at this time. No family at bedside. She is agreement with the plan for diuresis and looks forward to returning home. Purewick cathter in place draining clear, pale yellow urine. We discussed decreasing medications this week may have contributed to admission and the need for finding a balance of medications that do not make her feel poorly but can manage her symptoms at home.  Medications: Patient has received 2 doses of IV Lasix 80 mg at 0240 and 1040.  Goals of Care: Return home with hospice services. Pt has out of hospital DNR. No Tube Feeding and No Intubation.  Discharge Planning: [facility] will continue to follow patient while in the hospital and anticipate any discharge needs. Should ambulance transport be needed at the time of discharge, please call [facility] as [facility] contracts with [facility] for our patients.  Communication to [facility]: no family at bedside.  Communication to [facility]: notified Edd Fabiananya Troutman, RN and Albin FellingAnne Batten, CSW of patient's admission.  Transfer summary and current [facility]  medication list placed on shadow chart.  Please call for any hospice related questions or concerns.  Thank you, Haynes Bastracy Ennis, RN, BSN [facility] Hospital Liaison [phone]  [facility] Hospital Liaisons are on AMION.

## 2017-08-27 NOTE — ED Notes (Signed)
Heart healthy/ carb modified fluid restriction 1200 mL breakfast tray ordered

## 2017-08-28 LAB — BASIC METABOLIC PANEL
Anion gap: 14 (ref 5–15)
BUN: 150 mg/dL — AB (ref 6–20)
CO2: 31 mmol/L (ref 22–32)
Calcium: 9.3 mg/dL (ref 8.9–10.3)
Chloride: 91 mmol/L — ABNORMAL LOW (ref 101–111)
Creatinine, Ser: 3.16 mg/dL — ABNORMAL HIGH (ref 0.44–1.00)
GFR, EST AFRICAN AMERICAN: 15 mL/min — AB (ref >60)
GFR, EST NON AFRICAN AMERICAN: 13 mL/min — AB (ref >60)
Glucose, Bld: 142 mg/dL — ABNORMAL HIGH (ref 65–99)
Potassium: 4 mmol/L (ref 3.5–5.1)
SODIUM: 136 mmol/L (ref 135–145)

## 2017-08-28 LAB — GLUCOSE, CAPILLARY
GLUCOSE-CAPILLARY: 165 mg/dL — AB (ref 65–99)
Glucose-Capillary: 162 mg/dL — ABNORMAL HIGH (ref 65–99)

## 2017-08-28 LAB — MAGNESIUM: Magnesium: 2.5 mg/dL — ABNORMAL HIGH (ref 1.7–2.4)

## 2017-08-28 MED ORDER — METOPROLOL SUCCINATE ER 25 MG PO TB24: 12.5000 mg | ORAL_TABLET | Freq: Every day | ORAL | 0 refills | Status: DC

## 2017-08-28 MED ORDER — METOLAZONE 5 MG PO TABS: 2.5000 mg | ORAL_TABLET | ORAL | 3 refills | Status: DC

## 2017-08-28 NOTE — Progress Notes (Signed)
[facility] -- 3E17 [facility] ([facility] GIP RN visit at 1155 am  This is a related and covered GIP admission with [facility] diagnosis of Diastolic Heart Failure per Dr. Smith MinceMazzocchi.  Earlier in the week patient reported to hospice MD and primary care Dr. Oliver BarreJames [name] that she did not feel she was tolerating increase in medications for diuresis. She was advised to decrease metolazone from 5 mg to 2.5 mg po daily and continue Lasix 80 mg BID on Monday 08/22/17. Throughout the week  pt had increasing SOB with activity, BLE edema and a 3 lb weight gain. Hospice RN Edd Fabiananya Troutman evaluated patient in the home 08/26/17, notified Dr. Oliver BarreJames [name] of above and pt was advised to go to the ED.   Admitting diagnosis: Acute diastolic CHF  Day 2 of GIP  Visited with patient at bedside, no family present at this time though Ms. Minehart reports her daughter just left. Ms. Alto DenverHunt reports that she is breathing much better and that her leg swelling has greatly improved. She is hoping to go home today but has not seen the physician yet today for updated plan of care. She denies any pain and does not appear to be in any distress. Purewick catheter is draining clear yellow urine. She is on room air.  Medications: Pt is receiving Lasix 80 mg IV Q 8 hours and Metolazone 2.5 mg po daily. She has received Tylenol 650 mg po X 1 last evening. No other prn medications.   Goals of care: Return home with hospice services. Pt has out of hospital DNR. No tube feeding and no intubation.  Discharge planning: [facility] will continue to follow patient while in the hospital and anticipate any discharge needs. Should ambulance transport be needed at the time of discharge, please call [facility] as [facility] contracts with [facility] for our patients.  Communication to [facility]: no family at bedside.  Communication to [facility]: will update accordingly  Please call with any hospice related questions or concerns.  Thank you, Haynes Bastracy Ennis, RN, BSN [facility]  Hospital Liaison [phone]  [facility] Hospital Liaisons are on AMION.

## 2017-08-28 NOTE — Progress Notes (Signed)
Pt has orders to be discharged. Discharge instructions given and pt has no additional questions at this time. Medication regimen reviewed and pt educated. Pt verbalized understanding and has no additional questions. Telemetry box removed. IV removed and site in good condition. Pt stable and waiting for transportation via daughter.

## 2017-08-28 NOTE — Discharge Summary (Signed)
Physician Discharge Summary  Michelle Morton:[id number] DOB: 05/21/1932 DOA: 08/26/2017  PCP: Biagio Borg, MD  Admit date: 08/26/2017 Discharge date: 08/28/2017  Time spent: 40 minutes  Recommendations for Outpatient Follow-up:  1. Recommend goals of care with [facility] to be further delineated and would recommend discontinuation of some of the meds on the [facility] such as statin and vitamin D 2. Discontinued amlodipine in favor of low-dose metoprolol XL 12.5 and may need titration as an outpatient 3. Continuing Lasix 80 twice daily--she prefers not to use metolazone as this makes her feel dizzy-would recommend titration of diuresis to comfort with Lasix only 4. Prescribing TED hose for when she is awake so that her venous insufficiency can be alleviated and she will not feel as dizzy  Discharge Diagnoses:  Principal Problem:   Acute diastolic CHF (congestive heart failure) (HCC) Active Problems:   Hypothyroidism   Essential hypertension   CHF (congestive heart failure) (HCC)   DM (diabetes mellitus), type 2 with renal complications (HCC)   Acute CHF (congestive heart failure) [facility])   Discharge Condition: Improved  Diet recommendation: Heart healthy  Filed Weights   08/27/17 0843 08/27/17 1545 08/28/17 0545  Weight: 83.2 kg (183 lb 6.8 oz) 82.2 kg (181 lb 4.8 oz) 80.6 kg (177 lb 9.6 oz)    History of present illness:  84 year old female advanced heart failure multiple admissions to the hospital and currently on hospice for diagnosis of acute heart failure cardiorenal syndrome-baseline weight as an outpatient about 168 at dry weight last admission 05/2017 was sent home on Lasix 80 twice daily Creatinines have been in the 2-3 range and she has had difficulty with volume management with adjustment of oral Lasix as well as IV Lasix by primary care physician and hospice physician and ultimately because of weight gain shortness of breath was sent to the emergency room after being seen at PCP  office where weight was noted to be around 188 pounds on 3/25 She was diuresed with Lasix 80 3 times daily but became orthostatic and hypotensive and I had a long discussion with her about the balance of trying to get to her dry weight with also difficulty in diuresis and keeping her dry because of orthostasis and dizziness I am prescribing for TED hose on discharge She had nonsustained VT 8 beats and so discontinued amlodipine and placed on metoprolol 12.5-her magnesium was 2.5 so she does not need replacement and I think with only a one-time occurrence she may be able to have even a lower dose or discontinue the metoprolol if she has further dizziness I had a discussion with her and she does not wish to use metolazone at all as she is also says that this makes her feel uncomfortable I suppose we can diurese her with an extra dose of Lasix 40 mg if she gains more than 3 pounds and recommend that she keep her legs elevated at home Hospice is kindly requested to try diuresis at home and diurese to comfort if there is a need for the same to keep her out of the hospital-with hospice philosophy I have discontinued her statin and her vitamin D and multiple other meds that I do not feel are compatible with hospice approach Obviously her primary care physician hospice physician can change meds if they feel appropriate   Discharge Exam: Vitals:   08/28/17 0859 08/28/17 1216  BP:  (!) 106/57  Pulse: 93 82  Resp:  20  Temp:  97.7 F (36.5 C)  SpO2:  100%    General: eomi ncat no pallor no ict Cardiovascular: S1-S2 no JVD at this time-telemetry showed 8 beats of NSVT Respiratory: Chest is clear Abdomen soft nontender no rebound Lower extremities are soft without any swelling and she is not on any oxygen at this time and satting well  Discharge Instructions   Discharge Instructions    Diet - low sodium heart healthy   Complete by:  As directed    Discharge instructions   Complete by:  As  directed    We have simplified some of your home meds and you will need to take your diuretics as instructed I would recommend you also start a very low dose of medication to control your heart rate Your hospice physician can easily look in on you and adjust her meds  I would not recommend further hospitalization as hospice should be able to take care of some of your issues at home   Increase activity slowly   Complete by:  As directed      Allergies as of 08/28/2017      Reactions   Penicillins Rash   Allergic to IV penicillin but tolerates the oral form Has patient had a PCN reaction causing immediate rash, facial/tongue/throat swelling, SOB or lightheadedness with hypotension: no Has patient had a PCN reaction causing severe rash involving mucus membranes or skin necrosis: no Has patient had a PCN reaction that required hospitalization already in the hospital for other procedure  Has patient had a PCN reaction occurring within the last 10 years: no If all of the above answers are "NO", then ma      Medication List    STOP taking these medications   amLODipine 5 MG tablet Commonly known as:  NORVASC   Lancets Misc   lovastatin 40 MG tablet Commonly known as:  MEVACOR   ONE TOUCH ULTRA 2 w/Device Kit   ONE TOUCH ULTRA TEST test strip Generic drug:  glucose blood   Vitamin D3 50000 units Caps     TAKE these medications   acetaminophen 500 MG tablet Commonly known as:  TYLENOL Take 1,000 mg by mouth daily as needed for mild pain.   cetirizine 10 MG tablet Commonly known as:  ZYRTEC Take 10 mg by mouth daily as needed for allergies.   diclofenac sodium 1 % Gel Commonly known as:  VOLTAREN APPLY 4 GRAMS TOPICALLY FOUR TIMES DAILY as needed What changed:    how much to take  how to take this  when to take this  reasons to take this  additional instructions   furosemide 80 MG tablet Commonly known as:  LASIX Take 1 tablet (80 mg total) by mouth 2 (two)  times daily.   gabapentin 100 MG capsule Commonly known as:  NEURONTIN Take 2 capsules (200 mg total) by mouth at bedtime.   guaiFENesin 600 MG 12 hr tablet Commonly known as:  MUCINEX Take 1,200 mg by mouth daily.   JANUVIA 50 MG tablet Generic drug:  sitaGLIPtin TAKE ONE TABLET BY MOUTH ONCE DAILY   levothyroxine 88 MCG tablet Commonly known as:  SYNTHROID, LEVOTHROID Take 1 tablet (88 mcg total) by mouth daily.   metolazone 5 MG tablet Commonly known as:  ZAROXOLYN Take 0.5 tablets (2.5 mg total) by mouth every other day. What changed:  when to take this   metoprolol succinate 25 MG 24 hr tablet Commonly known as:  TOPROL-XL Take 0.5 tablets (12.5 mg total) by mouth daily.  NASAL SPRAY NA Place 1 spray into the nose 2 (two) times daily as needed (allergies).      Allergies  Allergen Reactions  . Penicillins Rash    Allergic to IV penicillin but tolerates the oral form Has patient had a PCN reaction causing immediate rash, facial/tongue/throat swelling, SOB or lightheadedness with hypotension: no Has patient had a PCN reaction causing severe rash involving mucus membranes or skin necrosis: no Has patient had a PCN reaction that required hospitalization already in the hospital for other procedure  Has patient had a PCN reaction occurring within the last 10 years: no If all of the above answers are "NO", then ma      The results of significant diagnostics from this hospitalization (including imaging, microbiology, ancillary and laboratory) are listed below for reference.    Significant Diagnostic Studies: Dg Chest 2 View  Result Date: 08/26/2017 CLINICAL DATA:  Shortness of breath EXAM: CHEST - 2 VIEW COMPARISON:  Chest radiograph 06/03/2017 FINDINGS: Unchanged mild cardiomegaly. No pulmonary edema. No pleural effusion or pneumothorax. Chronic lower thoracic compression deformity. IMPRESSION: Mild cardiomegaly without pulmonary edema. Electronically Signed   By:  Ulyses Jarred M.D.   On: 08/26/2017 19:47    Microbiology: No results found for this or any previous visit (from the past 240 hour(s)).   Labs: Basic Metabolic Panel: Recent Labs  Lab 08/22/17 0953 08/26/17 1859 08/27/17 0454 08/28/17 0541 08/28/17 1244  NA 137 134*  --  136  --   K 4.0 4.2  --  4.0  --   CL 98 95*  --  91*  --   CO2 27 25  --  31  --   GLUCOSE 159* 193*  --  142*  --   BUN 122* 141*  --  150*  --   CREATININE 2.80* 2.93* 3.08* 3.16*  --   CALCIUM 9.3 8.9  --  9.3  --   MG  --   --   --   --  2.5*   Liver Function Tests: Recent Labs  Lab 08/26/17 1859  AST 23  ALT 17  ALKPHOS 58  BILITOT 0.9  PROT 7.7  ALBUMIN 3.9   No results for input(s): LIPASE, AMYLASE in the last 168 hours. No results for input(s): AMMONIA in the last 168 hours. CBC: Recent Labs  Lab 08/26/17 1859 08/27/17 0454  WBC 6.2 6.6  HGB 11.4* 10.8*  HCT 37.0 35.3*  MCV 93.7 93.4  PLT 265 264   Cardiac Enzymes: No results for input(s): CKTOTAL, CKMB, CKMBINDEX, TROPONINI in the last 168 hours. BNP: BNP (last 3 results) Recent Labs    05/06/17 2328 06/03/17 1824 08/26/17 1859  BNP 121.3* 115.3* 71.9    ProBNP (last 3 results) No results for input(s): PROBNP in the last 8760 hours.  CBG: Recent Labs  Lab 08/27/17 1301 08/27/17 1624 08/27/17 2045 08/28/17 0732 08/28/17 1123  GLUCAP 172* 261* 107* 165* 162*       Signed:  Nita Sells MD   [facility] 08/28/2017, 2:20 PM

## 2017-08-28 NOTE — Progress Notes (Signed)
Unable to complete orthostatic vitals at this time due to pt wanting to rest. Will re attempt after breakfast.

## 2017-08-28 NOTE — Progress Notes (Signed)
Orthostatic Vitals:  Lying:120/51, HR 73 Sitting: 110/69, HR 75 Standing: 99/51, HR 76 Patient unable to stand for 3 minutes.

## 2017-08-28 NOTE — Progress Notes (Signed)
Hospice hospital liaison notified regarding pt's discharge today. Patient states daughter will pick her up and she will not need transportation set up.

## 2017-08-29 ENCOUNTER — Telehealth: Payer: Self-pay | Admitting: *Deleted

## 2017-08-29 NOTE — Telephone Encounter (Signed)
Pt was on TCM report admitted 08/26/17 for acute diastolic CHF. Pt D/C 08/28/17 and will f/u w/hospice/PCP for any additional med change.Marland Kitchen.Raechel Chute/lmb

## 2017-09-01 ENCOUNTER — Ambulatory Visit: Payer: Medicare Other

## 2017-09-02 ENCOUNTER — Encounter: Payer: Self-pay | Admitting: Internal Medicine

## 2017-09-02 ENCOUNTER — Ambulatory Visit (INDEPENDENT_AMBULATORY_CARE_PROVIDER_SITE_OTHER): Payer: Medicare Other | Admitting: Internal Medicine

## 2017-09-02 ENCOUNTER — Ambulatory Visit: Payer: Medicare Other

## 2017-09-02 VITALS — BP 132/88 | HR 92 | Temp 97.6°F | Ht 63.0 in | Wt 188.0 lb

## 2017-09-02 DIAGNOSIS — I10 Essential (primary) hypertension: Secondary | ICD-10-CM

## 2017-09-02 DIAGNOSIS — I5031 Acute diastolic (congestive) heart failure: Secondary | ICD-10-CM

## 2017-09-02 DIAGNOSIS — I509 Heart failure, unspecified: Secondary | ICD-10-CM | POA: Diagnosis not present

## 2017-09-02 DIAGNOSIS — M810 Age-related osteoporosis without current pathological fracture: Secondary | ICD-10-CM | POA: Diagnosis not present

## 2017-09-02 DIAGNOSIS — E1121 Type 2 diabetes mellitus with diabetic nephropathy: Secondary | ICD-10-CM

## 2017-09-02 DIAGNOSIS — N184 Chronic kidney disease, stage 4 (severe): Secondary | ICD-10-CM

## 2017-09-02 MED ORDER — DENOSUMAB 60 MG/ML ~~LOC~~ SOLN
60.0000 mg | Freq: Once | SUBCUTANEOUS | Status: AC
  Administered 2017-09-02: 60 mg via SUBCUTANEOUS

## 2017-09-02 NOTE — Progress Notes (Signed)
Subjective:    Patient ID: Michelle Morton, female    DOB: [birth year], 84 y.o.   MRN: [id number]  HPI  Here to f/u recent hospn 3/29-3/31 with acute on chronic dCHF, amlodipine changed to low dose BB, to cont lasix 80 bid, metolazone stopped due to intolerance, pt seen per palliative care with goals of care to continue to be evaluated per [facility], statin and Vit D stopped, and TED hose recommended, hasnt started yet but plans to get soon.  Pt denies chest pain, increased sob or doe, wheezing, orthopnea, PND, increased LE swelling, palpitations, dizziness or syncope.  Pt denies new neurological symptoms such as new headache, or facial or extremity weakness or numbness   Pt denies polydipsia, polyuria Wt Readings from Last 3 Encounters:  09/02/17 188 lb (85.3 kg)  08/28/17 177 lb 9.6 oz (80.6 kg)  08/22/17 188 lb (85.3 kg)  Due to see renal every 3 months.  No other interval hx or new complaints Past Medical History:  Diagnosis Date  . Allergic rhinitis, cause unspecified 02/14/2013  . Arthritis   . Arthus phenomenon   . Diabetes mellitus, type 2 (HCC)   . Hearing loss    right ear, no hearing aid  . History of blood transfusion    [name] - unsure number of units transfused  . Hyperlipidemia   . Hypertension   . Hypothyroidism   . Kidney stones   . Osteoarthritis, knee    knees - otc med prn  . SVD (spontaneous vaginal delivery)    x 4  . Varicose veins    lower legs   Past Surgical History:  Procedure Laterality Date  . ABDOMINAL HYSTERECTOMY    . ANTERIOR AND POSTERIOR REPAIR N/A 04/17/2014   Procedure: ANTERIOR (CYSTOCELE) AND POSTERIOR REPAIR (RECTOCELE);  Surgeon: Lavina Hamman, MD;  Location: [facility];  Service: Gynecology;  Laterality: N/A;  . APPENDECTOMY    . CATARACT EXTRACTION Bilateral   . COLONOSCOPY    . KIDNEY STONE SURGERY     removal of stone  . REPLACEMENT TOTAL KNEE Bilateral   . TONSILLECTOMY    . WISDOM TOOTH EXTRACTION      reports that she quit smoking  about 25 years ago. Her smoking use included cigarettes. She has a 16.00 pack-year smoking history. She has never used smokeless tobacco. She reports that she does not drink alcohol or use drugs. family history includes Breast cancer in her daughter; Diabetes in her brother, father, mother, and sister; Hypertension in her father. Allergies  Allergen Reactions  . Penicillins Rash    Allergic to IV penicillin but tolerates the oral form Has patient had a PCN reaction causing immediate rash, facial/tongue/throat swelling, SOB or lightheadedness with hypotension: no Has patient had a PCN reaction causing severe rash involving mucus membranes or skin necrosis: no Has patient had a PCN reaction that required hospitalization already in the hospital for other procedure  Has patient had a PCN reaction occurring within the last 10 years: no If all of the above answers are "NO", then ma   Current Outpatient Medications on File Prior to Visit  Medication Sig Dispense Refill  . acetaminophen (TYLENOL) 500 MG tablet Take 1,000 mg by mouth daily as needed for mild pain.     . cetirizine (ZYRTEC) 10 MG tablet Take 10 mg by mouth daily as needed for allergies.     Marland Kitchen diclofenac sodium (VOLTAREN) 1 % GEL APPLY 4 GRAMS TOPICALLY FOUR TIMES DAILY as needed (Patient  taking differently: Apply 4 g topically 4 (four) times daily as needed (PAIN). ) 400 g 3  . furosemide (LASIX) 80 MG tablet Take 1 tablet (80 mg total) by mouth 2 (two) times daily. 60 tablet 0  . gabapentin (NEURONTIN) 100 MG capsule Take 2 capsules (200 mg total) by mouth at bedtime. 60 capsule 3  . guaiFENesin (MUCINEX) 600 MG 12 hr tablet Take 1,200 mg by mouth daily.    Marland Kitchen. JANUVIA 50 MG tablet TAKE ONE TABLET BY MOUTH ONCE DAILY 90 tablet 2  . levothyroxine (SYNTHROID, LEVOTHROID) 88 MCG tablet Take 1 tablet (88 mcg total) by mouth daily. 90 tablet 0  . metoprolol succinate (TOPROL-XL) 25 MG 24 hr tablet Take 0.5 tablets (12.5 mg total) by mouth  daily. 30 tablet 0  . Oxymetazoline HCl (NASAL SPRAY NA) Place 1 spray into the nose 2 (two) times daily as needed (allergies).      No current facility-administered medications on file prior to visit.    Review of Systems  Constitutional: Negative for other unusual diaphoresis or sweats HENT: Negative for ear discharge or swelling Eyes: Negative for other worsening visual disturbances Respiratory: Negative for stridor or other swelling  Gastrointestinal: Negative for worsening distension or other blood Genitourinary: Negative for retention or other urinary change Musculoskeletal: Negative for other MSK pain or swelling Skin: Negative for color change or other new lesions Neurological: Negative for worsening tremors and other numbness  Psychiatric/Behavioral: Negative for worsening agitation or other fatigue All other system neg per pt    Objective:   Physical Exam BP 132/88   Pulse 92   Temp 97.6 F (36.4 C) (Oral)   Ht 5\' 3"  (1.6 m)   Wt 188 lb (85.3 kg)   SpO2 98%   BMI 33.30 kg/m  VS noted,  Constitutional: Pt appears in NAD HENT: Head: NCAT.  Right Ear: External ear normal.  Left Ear: External ear normal.  Eyes: . Pupils are equal, round, and reactive to light. Conjunctivae and EOM are normal Nose: without d/c or deformity Neck: Neck supple. Gross normal ROM Cardiovascular: Normal rate and regular rhythm.   Pulmonary/Chest: Effort normal and breath sounds without rales or wheezing.  Abd:  Soft, NT, ND, + BS, no organomegaly Neurological: Pt is alert. At baseline orientation, motor grossly intact Skin: Skin is warm. No rashes, other new lesions, 1+ bilat LE edema Psychiatric: Pt behavior is normal without agitation  No other exam findings  Lab Results  Component Value Date   WBC 6.6 08/27/2017   HGB 10.8 (L) 08/27/2017   HCT 35.3 (L) 08/27/2017   PLT 264 08/27/2017   GLUCOSE 142 (H) 08/28/2017   CHOL 175 12/17/2015   TRIG 82.0 12/17/2015   HDL 61.10  12/17/2015   LDLCALC 97 12/17/2015   ALT 17 08/26/2017   AST 23 08/26/2017   NA 136 08/28/2017   K 4.0 08/28/2017   CL 91 (L) 08/28/2017   CREATININE 3.16 (H) 08/28/2017   BUN 150 (H) 08/28/2017   CO2 31 08/28/2017   TSH 3.076 06/04/2017   INR 1.0 05/03/2008   HGBA1C 6.1 12/10/2016   MICROALBUR 4.8 (H) 10/16/2015      Assessment & Plan:

## 2017-09-02 NOTE — Patient Instructions (Addendum)
You will be contacted regarding the referral for: Diet  Ok to take extra lasix 80 mg during the day for weight gain of 3 lbs or more  Please remember to call for your yearly eye exam  You are given the Prolia shot today at your request  Please continue all other medications as before, and refills have been done if requested.  Please have the pharmacy call with any other refills you may need.  Please keep your appointments with your specialists as you may have planned  Please return in 3 months, or sooner if needed

## 2017-09-03 NOTE — Assessment & Plan Note (Signed)
stable overall by history and exam, recent data reviewed with pt, and pt to continue medical treatment as before,  to f/u any worsening symptoms or concerns BP Readings from Last 3 Encounters:  09/02/17 132/88  08/28/17 (!) 106/57  08/22/17 (!) 144/88

## 2017-09-03 NOTE — Assessment & Plan Note (Signed)
For prolia injection as planned

## 2017-09-03 NOTE — Assessment & Plan Note (Addendum)
To f/u renal as planned, pt reqeusts dietary referral for renal

## 2017-09-03 NOTE — Assessment & Plan Note (Signed)
Lab Results  Component Value Date   HGBA1C 6.1 12/10/2016  stable overall by history and exam, recent data reviewed with pt, and pt to continue medical treatment as before,  to f/u any worsening symptoms or concerns

## 2017-09-03 NOTE — Assessment & Plan Note (Addendum)
stable overall by history and exam, recent data reviewed with pt, and pt to continue medical treatment as before,  to f/u any worsening symptoms or concerns  Note:  Total time for pt hx, exam, review of record with pt in the room, determination of diagnoses and plan for further eval and tx is > 40 min, with over 50% spent in coordination and counseling of patient including the differential dx, tx, further evaluation and other management of diastolic CHF, CKD, DM, HTN and osteoporosis

## 2017-09-04 ENCOUNTER — Other Ambulatory Visit: Payer: Self-pay

## 2017-09-04 ENCOUNTER — Inpatient Hospital Stay (HOSPITAL_COMMUNITY)
Admission: EM | Admit: 2017-09-04 | Discharge: 2017-09-07 | DRG: 563 | Disposition: A | Discharge status: Skilled Nursing Facility | Attending: Internal Medicine | Admitting: Internal Medicine

## 2017-09-04 ENCOUNTER — Emergency Department (HOSPITAL_COMMUNITY)

## 2017-09-04 ENCOUNTER — Encounter (HOSPITAL_COMMUNITY): Payer: Self-pay

## 2017-09-04 DIAGNOSIS — Z96653 Presence of artificial knee joint, bilateral: Secondary | ICD-10-CM | POA: Diagnosis present

## 2017-09-04 DIAGNOSIS — E083299 Diabetes mellitus due to underlying condition with mild nonproliferative diabetic retinopathy without macular edema, unspecified eye: Secondary | ICD-10-CM

## 2017-09-04 DIAGNOSIS — E039 Hypothyroidism, unspecified: Secondary | ICD-10-CM | POA: Diagnosis present

## 2017-09-04 DIAGNOSIS — Z7189 Other specified counseling: Secondary | ICD-10-CM

## 2017-09-04 DIAGNOSIS — N184 Chronic kidney disease, stage 4 (severe): Secondary | ICD-10-CM | POA: Diagnosis present

## 2017-09-04 DIAGNOSIS — S42214A Unspecified nondisplaced fracture of surgical neck of right humerus, initial encounter for closed fracture: Secondary | ICD-10-CM

## 2017-09-04 DIAGNOSIS — R402413 Glasgow coma scale score 13-15, at hospital admission: Secondary | ICD-10-CM | POA: Diagnosis present

## 2017-09-04 DIAGNOSIS — W01198A Fall on same level from slipping, tripping and stumbling with subsequent striking against other object, initial encounter: Secondary | ICD-10-CM | POA: Diagnosis present

## 2017-09-04 DIAGNOSIS — Z66 Do not resuscitate: Secondary | ICD-10-CM | POA: Diagnosis present

## 2017-09-04 DIAGNOSIS — S42351A Displaced comminuted fracture of shaft of humerus, right arm, initial encounter for closed fracture: Secondary | ICD-10-CM | POA: Diagnosis present

## 2017-09-04 DIAGNOSIS — Z87891 Personal history of nicotine dependence: Secondary | ICD-10-CM

## 2017-09-04 DIAGNOSIS — I10 Essential (primary) hypertension: Secondary | ICD-10-CM | POA: Diagnosis present

## 2017-09-04 DIAGNOSIS — I5032 Chronic diastolic (congestive) heart failure: Secondary | ICD-10-CM | POA: Diagnosis present

## 2017-09-04 DIAGNOSIS — H9191 Unspecified hearing loss, right ear: Secondary | ICD-10-CM | POA: Diagnosis present

## 2017-09-04 DIAGNOSIS — S42211A Unspecified displaced fracture of surgical neck of right humerus, initial encounter for closed fracture: Secondary | ICD-10-CM | POA: Diagnosis not present

## 2017-09-04 DIAGNOSIS — I130 Hypertensive heart and chronic kidney disease with heart failure and stage 1 through stage 4 chronic kidney disease, or unspecified chronic kidney disease: Secondary | ICD-10-CM | POA: Diagnosis present

## 2017-09-04 DIAGNOSIS — Z79899 Other long term (current) drug therapy: Secondary | ICD-10-CM

## 2017-09-04 DIAGNOSIS — Z7984 Long term (current) use of oral hypoglycemic drugs: Secondary | ICD-10-CM

## 2017-09-04 DIAGNOSIS — Z880 Allergy status to penicillin: Secondary | ICD-10-CM

## 2017-09-04 DIAGNOSIS — J309 Allergic rhinitis, unspecified: Secondary | ICD-10-CM | POA: Diagnosis present

## 2017-09-04 DIAGNOSIS — T148XXA Other injury of unspecified body region, initial encounter: Secondary | ICD-10-CM | POA: Diagnosis present

## 2017-09-04 DIAGNOSIS — W19XXXA Unspecified fall, initial encounter: Secondary | ICD-10-CM | POA: Diagnosis present

## 2017-09-04 DIAGNOSIS — Y92009 Unspecified place in unspecified non-institutional (private) residence as the place of occurrence of the external cause: Secondary | ICD-10-CM

## 2017-09-04 DIAGNOSIS — Z515 Encounter for palliative care: Secondary | ICD-10-CM | POA: Diagnosis present

## 2017-09-04 DIAGNOSIS — M1990 Unspecified osteoarthritis, unspecified site: Secondary | ICD-10-CM | POA: Diagnosis present

## 2017-09-04 DIAGNOSIS — E1129 Type 2 diabetes mellitus with other diabetic kidney complication: Secondary | ICD-10-CM | POA: Diagnosis present

## 2017-09-04 DIAGNOSIS — E1122 Type 2 diabetes mellitus with diabetic chronic kidney disease: Secondary | ICD-10-CM | POA: Diagnosis present

## 2017-09-04 MED ORDER — FENTANYL CITRATE (PF) 100 MCG/2ML IJ SOLN
50.0000 ug | Freq: Once | INTRAMUSCULAR | Status: AC
Start: 2017-09-04 — End: 2017-09-04
  Administered 2017-09-04: 50 ug via INTRAVENOUS
  Filled 2017-09-04: qty 2

## 2017-09-04 NOTE — ED Triage Notes (Signed)
Pt brought in by [facility] from home for a fall that occurred at 1530 today. Pt lives with daughter but was at home alone when she tripped over the carpet and fell. Per EMS pt was still on the floor on arrival. Pt given fentanyl PTA. Pt A+Ox4. Pt arm splinted by EMS at this time.

## 2017-09-04 NOTE — ED Provider Notes (Signed)
Patient seen/examined in the Emergency Department in conjunction with Midlevel Provider  Patient reports right shoulder pain after fall. Exam : Awake alert, mild tenderness over right shoulder.  No chest or abdominal pain.  No injury to legs. Plan: Imaging pending at this time   Zadie RhineWickline, Johnnell Liou, MD 09/04/17 [id number]

## 2017-09-04 NOTE — ED Notes (Signed)
Patient transported to CT 

## 2017-09-05 ENCOUNTER — Encounter (HOSPITAL_COMMUNITY): Payer: Self-pay

## 2017-09-05 ENCOUNTER — Telehealth: Payer: Self-pay

## 2017-09-05 ENCOUNTER — Emergency Department (HOSPITAL_COMMUNITY)

## 2017-09-05 DIAGNOSIS — Z515 Encounter for palliative care: Secondary | ICD-10-CM | POA: Diagnosis present

## 2017-09-05 DIAGNOSIS — I5032 Chronic diastolic (congestive) heart failure: Secondary | ICD-10-CM

## 2017-09-05 DIAGNOSIS — Z96653 Presence of artificial knee joint, bilateral: Secondary | ICD-10-CM | POA: Diagnosis present

## 2017-09-05 DIAGNOSIS — S42351A Displaced comminuted fracture of shaft of humerus, right arm, initial encounter for closed fracture: Secondary | ICD-10-CM | POA: Diagnosis present

## 2017-09-05 DIAGNOSIS — Z79899 Other long term (current) drug therapy: Secondary | ICD-10-CM | POA: Diagnosis not present

## 2017-09-05 DIAGNOSIS — Z880 Allergy status to penicillin: Secondary | ICD-10-CM | POA: Diagnosis not present

## 2017-09-05 DIAGNOSIS — I10 Essential (primary) hypertension: Secondary | ICD-10-CM | POA: Diagnosis not present

## 2017-09-05 DIAGNOSIS — N184 Chronic kidney disease, stage 4 (severe): Secondary | ICD-10-CM | POA: Diagnosis not present

## 2017-09-05 DIAGNOSIS — J309 Allergic rhinitis, unspecified: Secondary | ICD-10-CM | POA: Diagnosis present

## 2017-09-05 DIAGNOSIS — M1990 Unspecified osteoarthritis, unspecified site: Secondary | ICD-10-CM | POA: Diagnosis present

## 2017-09-05 DIAGNOSIS — E1129 Type 2 diabetes mellitus with other diabetic kidney complication: Secondary | ICD-10-CM | POA: Diagnosis not present

## 2017-09-05 DIAGNOSIS — E039 Hypothyroidism, unspecified: Secondary | ICD-10-CM

## 2017-09-05 DIAGNOSIS — Z66 Do not resuscitate: Secondary | ICD-10-CM | POA: Diagnosis present

## 2017-09-05 DIAGNOSIS — T148XXA Other injury of unspecified body region, initial encounter: Secondary | ICD-10-CM | POA: Diagnosis present

## 2017-09-05 DIAGNOSIS — Z7189 Other specified counseling: Secondary | ICD-10-CM

## 2017-09-05 DIAGNOSIS — W01198A Fall on same level from slipping, tripping and stumbling with subsequent striking against other object, initial encounter: Secondary | ICD-10-CM | POA: Diagnosis present

## 2017-09-05 DIAGNOSIS — H9191 Unspecified hearing loss, right ear: Secondary | ICD-10-CM | POA: Diagnosis present

## 2017-09-05 DIAGNOSIS — I130 Hypertensive heart and chronic kidney disease with heart failure and stage 1 through stage 4 chronic kidney disease, or unspecified chronic kidney disease: Secondary | ICD-10-CM | POA: Diagnosis present

## 2017-09-05 DIAGNOSIS — W19XXXA Unspecified fall, initial encounter: Secondary | ICD-10-CM

## 2017-09-05 DIAGNOSIS — Z87891 Personal history of nicotine dependence: Secondary | ICD-10-CM | POA: Diagnosis not present

## 2017-09-05 DIAGNOSIS — Y92009 Unspecified place in unspecified non-institutional (private) residence as the place of occurrence of the external cause: Secondary | ICD-10-CM | POA: Diagnosis not present

## 2017-09-05 DIAGNOSIS — S42424A Nondisplaced comminuted supracondylar fracture without intercondylar fracture of right humerus, initial encounter for closed fracture: Secondary | ICD-10-CM | POA: Diagnosis not present

## 2017-09-05 DIAGNOSIS — R402413 Glasgow coma scale score 13-15, at hospital admission: Secondary | ICD-10-CM | POA: Diagnosis present

## 2017-09-05 DIAGNOSIS — E1122 Type 2 diabetes mellitus with diabetic chronic kidney disease: Secondary | ICD-10-CM | POA: Diagnosis present

## 2017-09-05 DIAGNOSIS — S42211A Unspecified displaced fracture of surgical neck of right humerus, initial encounter for closed fracture: Secondary | ICD-10-CM | POA: Diagnosis present

## 2017-09-05 DIAGNOSIS — Z7984 Long term (current) use of oral hypoglycemic drugs: Secondary | ICD-10-CM | POA: Diagnosis not present

## 2017-09-05 DIAGNOSIS — E083299 Diabetes mellitus due to underlying condition with mild nonproliferative diabetic retinopathy without macular edema, unspecified eye: Secondary | ICD-10-CM | POA: Diagnosis not present

## 2017-09-05 LAB — BASIC METABOLIC PANEL
Anion gap: 16 — ABNORMAL HIGH (ref 5–15)
BUN: 148 mg/dL — AB (ref 6–20)
CO2: 23 mmol/L (ref 22–32)
CREATININE: 2.53 mg/dL — AB (ref 0.44–1.00)
Calcium: 8.4 mg/dL — ABNORMAL LOW (ref 8.9–10.3)
Chloride: 97 mmol/L — ABNORMAL LOW (ref 101–111)
GFR calc Af Amer: 19 mL/min — ABNORMAL LOW (ref >60)
GFR, EST NON AFRICAN AMERICAN: 16 mL/min — AB (ref >60)
GLUCOSE: 205 mg/dL — AB (ref 65–99)
POTASSIUM: 3.9 mmol/L (ref 3.5–5.1)
SODIUM: 136 mmol/L (ref 135–145)

## 2017-09-05 LAB — CBC WITH DIFFERENTIAL/PLATELET
BASOS ABS: 0 10*3/uL (ref 0.0–0.1)
BASOS PCT: 0 %
EOS ABS: 0 10*3/uL (ref 0.0–0.7)
EOS PCT: 0 %
HCT: 36.2 % (ref 36.0–46.0)
Hemoglobin: 11.5 g/dL — ABNORMAL LOW (ref 12.0–15.0)
Lymphocytes Relative: 5 %
Lymphs Abs: 0.5 10*3/uL — ABNORMAL LOW (ref 0.7–4.0)
MCH: 29.4 pg (ref 26.0–34.0)
MCHC: 31.8 g/dL (ref 30.0–36.0)
MCV: 92.6 fL (ref 78.0–100.0)
MONO ABS: 0.4 10*3/uL (ref 0.1–1.0)
Monocytes Relative: 4 %
Neutro Abs: 9.2 10*3/uL — ABNORMAL HIGH (ref 1.7–7.7)
Neutrophils Relative %: 91 %
PLATELETS: 227 10*3/uL (ref 150–400)
RBC: 3.91 MIL/uL (ref 3.87–5.11)
RDW: 13.7 % (ref 11.5–15.5)
WBC: 10.2 10*3/uL (ref 4.0–10.5)

## 2017-09-05 LAB — APTT: aPTT: 33 seconds (ref 24–36)

## 2017-09-05 LAB — TYPE AND SCREEN
ABO/RH(D): AB POS
ANTIBODY SCREEN: NEGATIVE

## 2017-09-05 LAB — GLUCOSE, CAPILLARY
GLUCOSE-CAPILLARY: 218 mg/dL — AB (ref 65–99)
Glucose-Capillary: 221 mg/dL — ABNORMAL HIGH (ref 65–99)

## 2017-09-05 LAB — CBG MONITORING, ED
Glucose-Capillary: 174 mg/dL — ABNORMAL HIGH (ref 65–99)
Glucose-Capillary: 82 mg/dL (ref 65–99)

## 2017-09-05 LAB — CK: CK TOTAL: 327 U/L — AB (ref 38–234)

## 2017-09-05 LAB — ABO/RH: ABO/RH(D): AB POS

## 2017-09-05 LAB — BRAIN NATRIURETIC PEPTIDE: B Natriuretic Peptide: 254.4 pg/mL — ABNORMAL HIGH (ref 0.0–100.0)

## 2017-09-05 LAB — PROTIME-INR
INR: 1.05
PROTHROMBIN TIME: 13.6 s (ref 11.4–15.2)

## 2017-09-05 MED ORDER — MORPHINE SULFATE (PF) 4 MG/ML IV SOLN
1.0000 mg | INTRAVENOUS | Status: DC | PRN
  Filled 2017-09-05: qty 1

## 2017-09-05 MED ORDER — GUAIFENESIN ER 600 MG PO TB12: 1200.0000 mg | ORAL_TABLET | Freq: Every day | ORAL | Status: DC

## 2017-09-05 MED ORDER — METHOCARBAMOL 500 MG PO TABS
500.0000 mg | ORAL_TABLET | Freq: Three times a day (TID) | ORAL | Status: DC | PRN
  Administered 2017-09-07: 500 mg via ORAL
  Filled 2017-09-05: qty 1

## 2017-09-05 MED ORDER — SALINE SPRAY 0.65 % NA SOLN: Freq: Two times a day (BID) | NASAL | Status: DC | PRN

## 2017-09-05 MED ORDER — SODIUM CHLORIDE 0.9 % IV BOLUS
500.0000 mL | Freq: Once | INTRAVENOUS | Status: AC
  Administered 2017-09-05: 500 mL via INTRAVENOUS

## 2017-09-05 MED ORDER — HYDRALAZINE HCL 20 MG/ML IJ SOLN: 5.0000 mg | INTRAMUSCULAR | Status: DC | PRN

## 2017-09-05 MED ORDER — LEVOTHYROXINE SODIUM 88 MCG PO TABS
88.0000 ug | ORAL_TABLET | Freq: Every day | ORAL | Status: DC
  Administered 2017-09-05 – 2017-09-07 (×3): 88 ug via ORAL
  Filled 2017-09-05 (×3): qty 1

## 2017-09-05 MED ORDER — METOPROLOL SUCCINATE ER 25 MG PO TB24
12.5000 mg | ORAL_TABLET | Freq: Every day | ORAL | Status: DC
  Administered 2017-09-05 – 2017-09-07 (×2): 12.5 mg via ORAL
  Filled 2017-09-05 (×4): qty 1

## 2017-09-05 MED ORDER — LORATADINE 10 MG PO TABS
10.0000 mg | ORAL_TABLET | Freq: Every day | ORAL | Status: DC
  Administered 2017-09-05 – 2017-09-07 (×3): 10 mg via ORAL
  Filled 2017-09-05 (×3): qty 1

## 2017-09-05 MED ORDER — INSULIN ASPART 100 UNIT/ML ~~LOC~~ SOLN
0.0000 [IU] | Freq: Every day | SUBCUTANEOUS | Status: DC
  Administered 2017-09-05: 2 [IU] via SUBCUTANEOUS

## 2017-09-05 MED ORDER — SENNOSIDES-DOCUSATE SODIUM 8.6-50 MG PO TABS: 1.0000 | ORAL_TABLET | Freq: Every evening | ORAL | Status: DC | PRN

## 2017-09-05 MED ORDER — ZOLPIDEM TARTRATE 5 MG PO TABS: 5.0000 mg | ORAL_TABLET | Freq: Every evening | ORAL | Status: DC | PRN

## 2017-09-05 MED ORDER — GABAPENTIN 100 MG PO CAPS
200.0000 mg | ORAL_CAPSULE | Freq: Every day | ORAL | Status: DC
  Administered 2017-09-05 – 2017-09-06 (×2): 200 mg via ORAL
  Filled 2017-09-05 (×2): qty 2

## 2017-09-05 MED ORDER — FUROSEMIDE 80 MG PO TABS
80.0000 mg | ORAL_TABLET | Freq: Two times a day (BID) | ORAL | Status: DC
  Administered 2017-09-05 – 2017-09-07 (×6): 80 mg via ORAL
  Filled 2017-09-05: qty 4
  Filled 2017-09-05 (×5): qty 1

## 2017-09-05 MED ORDER — GUAIFENESIN ER 600 MG PO TB12
1200.0000 mg | ORAL_TABLET | Freq: Two times a day (BID) | ORAL | Status: DC
  Administered 2017-09-05 – 2017-09-07 (×4): 1200 mg via ORAL
  Filled 2017-09-05 (×4): qty 2

## 2017-09-05 MED ORDER — INSULIN ASPART 100 UNIT/ML ~~LOC~~ SOLN
0.0000 [IU] | Freq: Three times a day (TID) | SUBCUTANEOUS | Status: DC
  Administered 2017-09-05: 3 [IU] via SUBCUTANEOUS
  Administered 2017-09-05 – 2017-09-06 (×2): 2 [IU] via SUBCUTANEOUS
  Administered 2017-09-06: 3 [IU] via SUBCUTANEOUS
  Administered 2017-09-07: 1 [IU] via SUBCUTANEOUS
  Administered 2017-09-07 (×2): 2 [IU] via SUBCUTANEOUS
  Filled 2017-09-05: qty 1

## 2017-09-05 MED ORDER — HYDROCODONE-ACETAMINOPHEN 5-325 MG PO TABS
1.0000 | ORAL_TABLET | Freq: Once | ORAL | Status: AC
  Administered 2017-09-05: 1 via ORAL
  Filled 2017-09-05: qty 1

## 2017-09-05 MED ORDER — ALBUTEROL SULFATE (2.5 MG/3ML) 0.083% IN NEBU
2.5000 mg | INHALATION_SOLUTION | RESPIRATORY_TRACT | Status: DC | PRN
  Administered 2017-09-06: 2.5 mg via RESPIRATORY_TRACT
  Filled 2017-09-05: qty 3

## 2017-09-05 MED ORDER — ACETAMINOPHEN 325 MG PO TABS
650.0000 mg | ORAL_TABLET | Freq: Every day | ORAL | Status: DC | PRN
  Administered 2017-09-06: 650 mg via ORAL
  Filled 2017-09-05: qty 2

## 2017-09-05 MED ORDER — FENTANYL CITRATE (PF) 100 MCG/2ML IJ SOLN
50.0000 ug | Freq: Once | INTRAMUSCULAR | Status: AC
  Administered 2017-09-05: 50 ug via INTRAVENOUS
  Filled 2017-09-05: qty 2

## 2017-09-05 MED ORDER — DICLOFENAC SODIUM 1 % TD GEL: 4.0000 g | Freq: Four times a day (QID) | TRANSDERMAL | Status: DC | PRN

## 2017-09-05 MED ORDER — DOCUSATE SODIUM 100 MG PO CAPS
100.0000 mg | ORAL_CAPSULE | Freq: Two times a day (BID) | ORAL | Status: DC
  Administered 2017-09-05 – 2017-09-07 (×5): 100 mg via ORAL
  Filled 2017-09-05 (×6): qty 1

## 2017-09-05 MED ORDER — HYDROCODONE-ACETAMINOPHEN 5-325 MG PO TABS
1.0000 | ORAL_TABLET | Freq: Four times a day (QID) | ORAL | Status: DC | PRN
Start: 2017-09-05 — End: 2017-09-07
  Administered 2017-09-05 – 2017-09-07 (×3): 1 via ORAL
  Filled 2017-09-05 (×3): qty 1

## 2017-09-05 MED ORDER — ONDANSETRON HCL 4 MG/2ML IJ SOLN: 4.0000 mg | Freq: Three times a day (TID) | INTRAMUSCULAR | Status: DC | PRN

## 2017-09-05 MED ORDER — HEPARIN SODIUM (PORCINE) 5000 UNIT/ML IJ SOLN
5000.0000 [IU] | Freq: Three times a day (TID) | INTRAMUSCULAR | Status: DC
  Administered 2017-09-05 – 2017-09-07 (×6): 5000 [IU] via SUBCUTANEOUS
  Filled 2017-09-05 (×5): qty 1

## 2017-09-05 NOTE — ED Notes (Signed)
Patient transported to [id number] by RN.

## 2017-09-05 NOTE — ED Notes (Signed)
Ordered dinner tray.  

## 2017-09-05 NOTE — Telephone Encounter (Signed)
FYI  Copied from CRM [id number]. Topic: Inquiry >> Sep 05, 2017  1:29 PM Crist InfanteHarrald, Kathy J wrote: Reason for CRM: Cordelia PenSherry with Hospice and palliative care would like the dr Jonny RuizJohn to know pt was [facility] and is in room 5C10

## 2017-09-05 NOTE — H&P (Signed)
History and Physical    Michelle Morton ZOX:[id number] DOB: 1931/06/12 DOA: 09/04/2017  Referring MD/NP/PA:   PCP: [name], MD   Patient coming from:  The patient is coming from home.  At baseline, pt is independent for most of ADL.   Chief Complaint: fall and right shoulder pain  HPI: Michelle Morton is a 84 y.o. female with medical history significant of hypertension, hyperlipidemia, diabetes mellitus, hypothyroidism, varicose vein, dCHF, CKD-4, and hospice care, who presents with fall and right shoulder pain.  Per report, pt fell when she used her walker and tripped on the carpet at home at about 15:30. She developed severe pain in right shoulder. She was on the floor for several hours until family back home. No LOC. No head or neck injury. Her left shoulder pain is constant, sharp, 9 out of 10 in severity, nonradiating. No numbness in arms. No Unilateral weakness or numbness or tingling to extremities. No facial droop or slurred speech. Patient denies chest pain, shortness breath, cough. No fever or chills. Denies nausea, vomiting, diarrhea, abdominal pain, symptoms of UTI.  ED Course: pt was found to have wBC 10.7, stable renal function, CK 327, no fever, heart rate in 90s, oxygen saturation 99% on room air. CT of head, C-spine and maxillofacial negative for acute issues. X-ray of R forearm is negative. No fracture of R forearm by x-ray. Patient was found to have nondisplaced comminuted right humerus head and neck fracture. Pt is placed on tele bed for obs.  Review of Systems:   General: no fevers, chills, no body weight gain, has fatigue HEENT: no blurry vision, hearing changes or sore throat Respiratory: no dyspnea, coughing, wheezing CV: no chest pain, no palpitations GI: no nausea, vomiting, abdominal pain, diarrhea, constipation GU: no dysuria, burning on urination, increased urinary frequency, hematuria  Ext: has trace leg edema Neuro: no unilateral weakness, numbness, or  tingling, no vision change or hearing loss. Had fall Skin: no rash, no skin tear. MSK: No muscle spasm, no deformity, no limitation of range of movement in spin Heme: No easy bruising.  Travel history: No recent long distant travel.  Allergy:  Allergies  Allergen Reactions  . Penicillins Rash    Allergic to IV penicillin but tolerates the oral form Has patient had a PCN reaction causing immediate rash, facial/tongue/throat swelling, SOB or lightheadedness with hypotension: no Has patient had a PCN reaction causing severe rash involving mucus membranes or skin necrosis: no Has patient had a PCN reaction that required hospitalization already in the hospital for other procedure  Has patient had a PCN reaction occurring within the last 10 years: no If all of the above answers are "NO", then ma    Past Medical History:  Diagnosis Date  . Allergic rhinitis, cause unspecified 02/14/2013  . Arthritis   . Arthus phenomenon   . Diabetes mellitus, type 2 (HCC)   . Hearing loss    right ear, no hearing aid  . History of blood transfusion    [name] - unsure number of units transfused  . Hyperlipidemia   . Hypertension   . Hypothyroidism   . Kidney stones   . Osteoarthritis, knee    knees - otc med prn  . SVD (spontaneous vaginal delivery)    x 4  . Varicose veins    lower legs    Past Surgical History:  Procedure Laterality Date  . ABDOMINAL HYSTERECTOMY    . ANTERIOR AND POSTERIOR REPAIR N/A 04/17/2014  Procedure: ANTERIOR (CYSTOCELE) AND POSTERIOR REPAIR (RECTOCELE);  Surgeon: Lavina Hamman, MD;  Location: [facility];  Service: Gynecology;  Laterality: N/A;  . APPENDECTOMY    . CATARACT EXTRACTION Bilateral   . COLONOSCOPY    . KIDNEY STONE SURGERY     removal of stone  . REPLACEMENT TOTAL KNEE Bilateral   . TONSILLECTOMY    . WISDOM TOOTH EXTRACTION      Social History:  reports that she quit smoking about 25 years ago. Her smoking use included cigarettes. She has a  16.00 pack-year smoking history. She has never used smokeless tobacco. She reports that she does not drink alcohol or use drugs.  Family History:  Family History  Problem Relation Age of Onset  . Diabetes Mother   . Diabetes Father   . Hypertension Father   . Breast cancer Daughter   . Diabetes Brother        x 1  . Diabetes Sister        x 4  . Colon cancer Neg Hx   . Esophageal cancer Neg Hx   . Pancreatic cancer Neg Hx   . Liver disease Neg Hx   . Kidney disease Neg Hx      Prior to Admission medications   Medication Sig Start Date End Date Taking? Authorizing Provider  acetaminophen (TYLENOL) 500 MG tablet Take 1,000 mg by mouth daily as needed for mild pain.    Yes [provider]  cetirizine (ZYRTEC) 10 MG tablet Take 10 mg by mouth daily as needed for allergies.    Yes [provider]  diclofenac sodium (VOLTAREN) 1 % GEL APPLY 4 GRAMS TOPICALLY FOUR TIMES DAILY as needed Patient taking differently: Apply 4 g topically 4 (four) times daily as needed (PAIN).  06/04/16  Yes [name], MD  furosemide (LASIX) 80 MG tablet Take 1 tablet (80 mg total) by mouth 2 (two) times daily. 06/08/17  Yes Regalado, Belkys A, MD  gabapentin (NEURONTIN) 100 MG capsule Take 2 capsules (200 mg total) by mouth at bedtime. 01/26/17  Yes Judi Saa, DO  guaiFENesin (MUCINEX) 600 MG 12 hr tablet Take 1,200 mg by mouth daily.   Yes [provider]  JANUVIA 50 MG tablet TAKE ONE TABLET BY MOUTH ONCE DAILY 03/21/17  Yes [name], MD  levothyroxine (SYNTHROID, LEVOTHROID) 88 MCG tablet Take 1 tablet (88 mcg total) by mouth daily. 08/22/17  Yes [name], MD  metoprolol succinate (TOPROL-XL) 25 MG 24 hr tablet Take 0.5 tablets (12.5 mg total) by mouth daily. 08/28/17  Yes Rhetta Mura, MD  Oxymetazoline HCl (NASAL SPRAY NA) Place 1 spray into the nose 2 (two) times daily as needed (allergies).    Yes [provider]    Physical Exam: Vitals:    09/05/17 0315 09/05/17 0330 09/05/17 0345 09/05/17 0400  BP: 120/73 114/62 128/65 130/74  Pulse: 90 86 88 93  Resp: 11 11 13 14   SpO2: 99% 99% 100% 100%  Weight:      Height:       General: Not in acute distress HEENT:       Eyes: PERRL, EOMI, no scleral icterus.       ENT: No discharge from the ears and nose, no pharynx injection, no tonsillar enlargement.        Neck: No JVD, no bruit, no mass felt. Heme: No neck lymph node enlargement. Cardiac: S1/S2, RRR, No murmurs, No gallops or rubs. Respiratory: No rales, wheezing, rhonchi  or rubs. GI: Soft, nondistended, nontender, no rebound pain, no organomegaly, BS present. GU: No hematuria Ext: has trace leg edema bilaterally. 2+DP/PT pulse bilaterally. Musculoskeletal: has tenderness in R shoulder, s/p of shoulder immobilizer placement. Skin: No rashes.  Neuro: Alert, oriented X3, cranial nerves II-XII grossly intact, moves all extremities normally.  Psych: Patient is not psychotic, no suicidal or hemocidal ideation.  Labs on Admission: I have personally reviewed following labs and imaging studies  CBC: Recent Labs  Lab 09/05/17 0111  WBC 10.2  NEUTROABS 9.2*  HGB 11.5*  HCT 36.2  MCV 92.6  PLT 227   Basic Metabolic Panel: Recent Labs  Lab 09/05/17 0111  NA 136  K 3.9  CL 97*  CO2 23  GLUCOSE 205*  BUN 148*  CREATININE 2.53*  CALCIUM 8.4*   GFR: Estimated Creatinine Clearance: 17.2 mL/min (A) (by C-G formula based on SCr of 2.53 mg/dL (H)). Liver Function Tests: No results for input(s): AST, ALT, ALKPHOS, BILITOT, PROT, ALBUMIN in the last 168 hours. No results for input(s): LIPASE, AMYLASE in the last 168 hours. No results for input(s): AMMONIA in the last 168 hours. Coagulation Profile: No results for input(s): INR, PROTIME in the last 168 hours. Cardiac Enzymes: Recent Labs  Lab 09/05/17 0111  CKTOTAL 327*   BNP (last 3 results) No results for input(s): PROBNP in the last 8760 hours. HbA1C: No  results for input(s): HGBA1C in the last 72 hours. CBG: No results for input(s): GLUCAP in the last 168 hours. Lipid Profile: No results for input(s): CHOL, HDL, LDLCALC, TRIG, CHOLHDL, LDLDIRECT in the last 72 hours. Thyroid Function Tests: No results for input(s): TSH, T4TOTAL, FREET4, T3FREE, THYROIDAB in the last 72 hours. Anemia Panel: No results for input(s): VITAMINB12, FOLATE, FERRITIN, TIBC, IRON, RETICCTPCT in the last 72 hours. Urine analysis:    Component Value Date/Time   COLORURINE STRAW (A) 05/06/2017 2245   APPEARANCEUR CLEAR 05/06/2017 2245   LABSPEC 1.009 05/06/2017 2245   PHURINE 6.0 05/06/2017 2245   GLUCOSEU NEGATIVE 05/06/2017 2245   GLUCOSEU NEGATIVE 10/16/2015 1436   HGBUR NEGATIVE 05/06/2017 2245   BILIRUBINUR NEGATIVE 05/06/2017 2245   KETONESUR NEGATIVE 05/06/2017 2245   PROTEINUR NEGATIVE 05/06/2017 2245   UROBILINOGEN 0.2 10/16/2015 1436   NITRITE NEGATIVE 05/06/2017 2245   LEUKOCYTESUR NEGATIVE 05/06/2017 2245   Sepsis Labs: @LABRCNTIP (procalcitonin:4,lacticidven:4) )No results found for this or any previous visit (from the past 240 hour(s)).   Radiological Exams on Admission: Dg Clavicle Right  Result Date: 09/05/2017 CLINICAL DATA:  Pain after trip and fall today. EXAM: RIGHT CLAVICLE - 2+ VIEWS COMPARISON:  None. FINDINGS: No acute clavicular fracture joint dislocation. Acute comminuted fracture of the right humeral head and neck without angulation greater than 45 degrees or displacement greater than 1 cm. The adjacent included ribs and lung are nonacute. Aortic atherosclerosis without aneurysm is seen at the arch. Cervical spondylosis. Bilateral carotid atherosclerosis. IMPRESSION: 1. Negative for acute right clavicular fracture. 2. Positive for acute comminuted fracture of the right humeral head and neck without significant angulation or displacement. Electronically Signed   By: Tollie Eth M.D.   On: 09/05/2017 01:01   Dg Forearm Right  Result  Date: 09/05/2017 CLINICAL DATA:  Right arm pain after slip fall today. EXAM: RIGHT FOREARM - 2 VIEW COMPARISON:  None. FINDINGS: No acute fracture of the forearm. 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 Carpal rows are maintained. Enthesopathy at the triceps insertion on the olecranon. No elbow joint effusion. No radial  head fracture. IMPRESSION: No acute fracture or malalignment of the right forearm and included wrist. 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 Spurring at the triceps insertion on the olecranon. Electronically Signed   By: Tollie Eth M.D.   On: 09/05/2017 01:05   Ct Head Wo Contrast  Result Date: 09/05/2017 CLINICAL DATA:  Right shoulder pain after a fall. EXAM: CT HEAD WITHOUT CONTRAST CT MAXILLOFACIAL WITHOUT CONTRAST CT CERVICAL SPINE WITHOUT CONTRAST TECHNIQUE: Multidetector CT imaging of the head, cervical spine, and maxillofacial structures were performed using the standard protocol without intravenous contrast. Multiplanar CT image reconstructions of the cervical spine and maxillofacial structures were also generated. COMPARISON:  Cervical spine radiographs 12/29/2016 FINDINGS: CT HEAD FINDINGS Brain: Diffuse cerebral atrophy. Mild ventricular dilatation consistent with central atrophy. Low-attenuation changes in the deep white matter consistent small vessel ischemia. No mass effect or midline shift. No abnormal extra-axial fluid collections. Gray-white matter junctions are distinct. Basal cisterns are not effaced. No acute intracranial hemorrhage. Vascular: Intracranial arterial vascular calcifications are present. Skull: Calvarium appears intact. No acute depressed skull fractures. Sinuses/Orbits: Paranasal sinuses and mastoid air cells are clear. Other: None. CT MAXILLOFACIAL FINDINGS Osseous: No fracture or mandibular dislocation. No destructive process. Multiple dental caries and tooth extractions. Orbits: Negative. No traumatic or inflammatory finding. Sinuses:  Clear.  Concha bullosa of the right middle turbinate. Soft tissues: Negative. CT CERVICAL SPINE FINDINGS Alignment: Normal. Skull base and vertebrae: No acute fracture. No primary bone lesion or focal pathologic process. Soft tissues and spinal canal: No prevertebral fluid or swelling. No visible canal hematoma. Disc levels: Degenerative changes in the cervical spine with narrowed interspaces and endplate hypertrophic changes most prominent at C3-4, C5-6, C6-7, and C7-T1 levels. Degenerative changes in the facet joints. Upper chest: Limited visualization of the lung apices. Vascular calcifications in the cervical carotid arteries. Other: None. IMPRESSION: 1. No acute intracranial abnormalities. Chronic atrophy and small vessel ischemic changes. 2. No acute displaced orbital or facial fractures identified. 3. Normal alignment of the cervical spine. No acute displaced fractures. Degenerative changes. Electronically Signed   By: Burman Nieves M.D.   On: 09/05/2017 00:41   Ct Cervical Spine Wo Contrast  Result Date: 09/05/2017 CLINICAL DATA:  Right shoulder pain after a fall. EXAM: CT HEAD WITHOUT CONTRAST CT MAXILLOFACIAL WITHOUT CONTRAST CT CERVICAL SPINE WITHOUT CONTRAST TECHNIQUE: Multidetector CT imaging of the head, cervical spine, and maxillofacial structures were performed using the standard protocol without intravenous contrast. Multiplanar CT image reconstructions of the cervical spine and maxillofacial structures were also generated. COMPARISON:  Cervical spine radiographs 12/29/2016 FINDINGS: CT HEAD FINDINGS Brain: Diffuse cerebral atrophy. Mild ventricular dilatation consistent with central atrophy. Low-attenuation changes in the deep white matter consistent small vessel ischemia. No mass effect or midline shift. No abnormal extra-axial fluid collections. Gray-white matter junctions are distinct. Basal cisterns are not effaced. No acute intracranial hemorrhage. Vascular: Intracranial arterial  vascular calcifications are present. Skull: Calvarium appears intact. No acute depressed skull fractures. Sinuses/Orbits: Paranasal sinuses and mastoid air cells are clear. Other: None. CT MAXILLOFACIAL FINDINGS Osseous: No fracture or mandibular dislocation. No destructive process. Multiple dental caries and tooth extractions. Orbits: Negative. No traumatic or inflammatory finding. Sinuses: Clear.  Concha bullosa of the right middle turbinate. Soft tissues: Negative. CT CERVICAL SPINE FINDINGS Alignment: Normal. Skull base and vertebrae: No acute fracture. No primary bone lesion or focal pathologic process. Soft tissues and spinal canal: No prevertebral fluid or swelling. No visible canal hematoma. Disc levels: Degenerative changes in the  cervical spine with narrowed interspaces and endplate hypertrophic changes most prominent at C3-4, C5-6, C6-7, and C7-T1 levels. Degenerative changes in the facet joints. Upper chest: Limited visualization of the lung apices. Vascular calcifications in the cervical carotid arteries. Other: None. IMPRESSION: 1. No acute intracranial abnormalities. Chronic atrophy and small vessel ischemic changes. 2. No acute displaced orbital or facial fractures identified. 3. Normal alignment of the cervical spine. No acute displaced fractures. Degenerative changes. Electronically Signed   By: Burman NievesWilliam  Stevens M.D.   On: 09/05/2017 00:41   Dg Humerus Right  Result Date: 09/05/2017 CLINICAL DATA:  Right shoulder pain after fall today. EXAM: RIGHT HUMERUS - 2+ VIEW COMPARISON:  None. FINDINGS: Acute, comminuted closed fracture of the right surgical neck of the humerus and humeral head in the region of the biceps tubercles. No angulation greater than 45 degrees or displacement greater than 1 cm identified. No joint dislocation. IMPRESSION: Comminuted proximal humerus fracture involving the surgical neck and humeral head without significant angulation or displacement. Electronically Signed   By:  Tollie Ethavid  Kwon M.D.   On: 09/05/2017 01:02   Ct Maxillofacial Wo Cm  Result Date: 09/05/2017 CLINICAL DATA:  Right shoulder pain after a fall. EXAM: CT HEAD WITHOUT CONTRAST CT MAXILLOFACIAL WITHOUT CONTRAST CT CERVICAL SPINE WITHOUT CONTRAST TECHNIQUE: Multidetector CT imaging of the head, cervical spine, and maxillofacial structures were performed using the standard protocol without intravenous contrast. Multiplanar CT image reconstructions of the cervical spine and maxillofacial structures were also generated. COMPARISON:  Cervical spine radiographs 12/29/2016 FINDINGS: CT HEAD FINDINGS Brain: Diffuse cerebral atrophy. Mild ventricular dilatation consistent with central atrophy. Low-attenuation changes in the deep white matter consistent small vessel ischemia. No mass effect or midline shift. No abnormal extra-axial fluid collections. Gray-white matter junctions are distinct. Basal cisterns are not effaced. No acute intracranial hemorrhage. Vascular: Intracranial arterial vascular calcifications are present. Skull: Calvarium appears intact. No acute depressed skull fractures. Sinuses/Orbits: Paranasal sinuses and mastoid air cells are clear. Other: None. CT MAXILLOFACIAL FINDINGS Osseous: No fracture or mandibular dislocation. No destructive process. Multiple dental caries and tooth extractions. Orbits: Negative. No traumatic or inflammatory finding. Sinuses: Clear.  Concha bullosa of the right middle turbinate. Soft tissues: Negative. CT CERVICAL SPINE FINDINGS Alignment: Normal. Skull base and vertebrae: No acute fracture. No primary bone lesion or focal pathologic process. Soft tissues and spinal canal: No prevertebral fluid or swelling. No visible canal hematoma. Disc levels: Degenerative changes in the cervical spine with narrowed interspaces and endplate hypertrophic changes most prominent at C3-4, C5-6, C6-7, and C7-T1 levels. Degenerative changes in the facet joints. Upper chest: Limited visualization of  the lung apices. Vascular calcifications in the cervical carotid arteries. Other: None. IMPRESSION: 1. No acute intracranial abnormalities. Chronic atrophy and small vessel ischemic changes. 2. No acute displaced orbital or facial fractures identified. 3. Normal alignment of the cervical spine. No acute displaced fractures. Degenerative changes. Electronically Signed   By: Burman NievesWilliam  Stevens M.D.   On: 09/05/2017 00:41     EKG: Not done in ED, will get one.   Assessment/Plan Principal Problem:   Closed comminuted fracture of right humerus-head and neck Active Problems:   Hypothyroidism   Essential hypertension   Chronic kidney disease (CKD), stage IV (severe) (HCC)   Chronic diastolic CHF (congestive heart failure) (HCC)   DM (diabetes mellitus), type 2 with renal complications [facility](HCC)   Fall   Hospice care patient   Fall and closed comminuted fracture of right humerus-head and neck: pt seems to have  had a mechanica fall, which caused fracture of right humerus-head and neck. Patient has moderate pain now. No neurovascular compromise. Pt states that she dose not want to do surgery if possible. She probably does not need surgery. Since it already in early morning, I did not call ortho surgeon.   - will place on tele bed for obs - Pain control: morphine prn and percocet - When necessary Zofran for nausea - Robaxin for muscle spasm - type and cross, INR/PTT - PT/OT when able to (not ordered now) - Please call orthopedic surgeon in the morning. - consult to SW and CM  Chronic diastolic CHF: 2-D echo on 05/07/17 showed EF of 60-65% with grade 1 diastolic dysfunction. Patient has trace leg edema, but no JVD. No respiratory distress. CHF is compensated. -continue home Lasix and metoprolol -check BNP  Hypothyroidism: Last TSH was 3.076 on 06/04/17 -Continue home Synthroid  DM (diabetes mellitus), type 2 with renal complications: Last A1c 6.1 on 12/10/16, well controled. Patient is taking Januvia  at home -SSI  HTN:  -Continue home medications: metoprolol -also on lasix -IV hydralazine prn  Chronic kidney disease (CKD), stage IV (severe) (HCC): baseline creatinine 2.5-3.0. Her creatinine is 2.5-3.0, BUN 148. Stable. Follow-up renal function by BMP.  Hospice care patient: EDP spoke with hospice team, will will see pt in am. -they will come to see pt in AM    DVT ppx: SQ Heparin     Code Status: Full code Family Communication: None at bed side.   Disposition Plan:  Anticipate discharge back to previous home environment Consults called:  none Admission status: Obs / tele      Date of Service 09/05/2017    Lorretta Harp [facility] Pager [phone]  If 7PM-7AM, please contact night-coverage [email or web address] Password [email or web address] 09/05/2017, 6:02 AM

## 2017-09-05 NOTE — ED Notes (Signed)
On arrival to room pt was placed on bedpan and subsequently needed linen completely changed. Pt was only able to minimal assist change.

## 2017-09-05 NOTE — ED Notes (Signed)
Nutrition contacted and states no tray was sent. Pt given [place] sandwhich and apple sauce.

## 2017-09-05 NOTE — ED Provider Notes (Signed)
[facility] EMERGENCY DEPARTMENT Provider Note   CSN: [id number] Arrival date & time: 09/04/17  2224     History   Chief Complaint Chief Complaint  Patient presents with  . Fall    HPI Michelle Morton is a 84 y.o. female.  HPI 84 year old female past medical history significant for CKD, diabetes, hypertension that presents to the emergency department today for evaluation of right shoulder pain after mechanical fall.  Patient is currently on hospice.  She lives at home with her daughter.  Patient uses a walker at baseline.  States that earlier today approximately 230 she tripped over the carpet and fell onto her right shoulder.  Patient denies hitting her head or losing consciousness.  Patient states that she was on the floor since 230 until her daughter came home at 79 to get her.  Patient reports significant pain with range of motion of the right shoulder.  Patient was not ambulatory after the event.  Patient denies any other associated pain.  She was given fentanyl in route by EMS which improved her pain initially.  Pt denies any fever, chill, ha, vision changes, lightheadedness, dizziness, congestion, neck pain, cp, sob, cough, abd pain, n/v/d, urinary symptoms, change in bowel habits, melena, hematochezia, lower extremity paresthesias.  Past Medical History:  Diagnosis Date  . Allergic rhinitis, cause unspecified 02/14/2013  . Arthritis   . Arthus phenomenon   . Diabetes mellitus, type 2 (HCC)   . Hearing loss    right ear, no hearing aid  . History of blood transfusion    [name] - unsure number of units transfused  . Hyperlipidemia   . Hypertension   . Hypothyroidism   . Kidney stones   . Osteoarthritis, knee    knees - otc med prn  . SVD (spontaneous vaginal delivery)    x 4  . Varicose veins    lower legs    Patient Active Problem List   Diagnosis Date Noted  . Fall 09/05/2017  . Closed comminuted fracture of right humerus-head and neck  09/05/2017  . Hospice care patient 09/05/2017  . Eustachian tube dysfunction, left 08/22/2017  . Palliative care encounter   . Chronic diastolic CHF (congestive heart failure) (HCC) 06/04/2017  . DM (diabetes mellitus), type 2 with renal complications (HCC) 06/04/2017  . Weight gain 06/03/2017  . Acute diastolic CHF (congestive heart failure) (HCC) 05/07/2017  . Bilateral lower extremity edema 05/06/2017  . Osteoporosis 01/26/2017  . Degenerative disc disease, lumbar 12/29/2016  . Neck pain on left side 12/12/2016  . Leg cramps 06/05/2016  . General weakness 03/04/2016  . Hypoglycemia 12/17/2015  . RLS (restless legs syndrome) 12/17/2015  . Weight loss 12/17/2015  . Back pain 10/16/2015  . Right-sided chest pain 03/27/2015  . Diarrhea 03/27/2015  . Hoarseness 03/27/2015  . Loss of weight 03/27/2015  . Depression 01/07/2015  . Chronic kidney disease (CKD), stage IV (severe) (HCC) 01/07/2015  . AKI (acute kidney injury) (HCC) 10/09/2014  . Uterine prolaps 09/01/2013  . Peripheral edema 02/14/2013  . Allergic rhinitis 02/14/2013  . Obesity, Class II, BMI 35-39.9 05/12/2011  . Preventative health care 05/12/2011  . TINNITUS 01/09/2009  . Essential hypertension 05/05/2007  . Hypothyroidism 02/28/2007  . Diabetes (HCC) 02/28/2007  . Hyperlipidemia 02/28/2007  . Osteoarthrosis, unspecified whether generalized or localized, involving lower leg 02/28/2007    Past Surgical History:  Procedure Laterality Date  . ABDOMINAL HYSTERECTOMY    . ANTERIOR AND POSTERIOR REPAIR N/A 04/17/2014  Procedure: ANTERIOR (CYSTOCELE) AND POSTERIOR REPAIR (RECTOCELE);  Surgeon: Lavina Hamman, MD;  Location: [facility];  Service: Gynecology;  Laterality: N/A;  . APPENDECTOMY    . CATARACT EXTRACTION Bilateral   . COLONOSCOPY    . KIDNEY STONE SURGERY     removal of stone  . REPLACEMENT TOTAL KNEE Bilateral   . TONSILLECTOMY    . WISDOM TOOTH EXTRACTION       OB History   None      Home  Medications    Prior to Admission medications   Medication Sig Start Date End Date Taking? Authorizing Provider  acetaminophen (TYLENOL) 500 MG tablet Take 1,000 mg by mouth daily as needed for mild pain.    Yes [provider]  cetirizine (ZYRTEC) 10 MG tablet Take 10 mg by mouth daily as needed for allergies.    Yes [provider]  diclofenac sodium (VOLTAREN) 1 % GEL APPLY 4 GRAMS TOPICALLY FOUR TIMES DAILY as needed Patient taking differently: Apply 4 g topically 4 (four) times daily as needed (PAIN).  06/04/16  Yes [name], MD  furosemide (LASIX) 80 MG tablet Take 1 tablet (80 mg total) by mouth 2 (two) times daily. 06/08/17  Yes Regalado, Belkys A, MD  gabapentin (NEURONTIN) 100 MG capsule Take 2 capsules (200 mg total) by mouth at bedtime. 01/26/17  Yes Judi Saa, DO  guaiFENesin (MUCINEX) 600 MG 12 hr tablet Take 1,200 mg by mouth daily.   Yes [provider]  JANUVIA 50 MG tablet TAKE ONE TABLET BY MOUTH ONCE DAILY 03/21/17  Yes [name], MD  levothyroxine (SYNTHROID, LEVOTHROID) 88 MCG tablet Take 1 tablet (88 mcg total) by mouth daily. 08/22/17  Yes [name], MD  metoprolol succinate (TOPROL-XL) 25 MG 24 hr tablet Take 0.5 tablets (12.5 mg total) by mouth daily. 08/28/17  Yes Rhetta Mura, MD  Oxymetazoline HCl (NASAL SPRAY NA) Place 1 spray into the nose 2 (two) times daily as needed (allergies).    Yes [provider]    Family History Family History  Problem Relation Age of Onset  . Diabetes Mother   . Diabetes Father   . Hypertension Father   . Breast cancer Daughter   . Diabetes Brother        x 1  . Diabetes Sister        x 4  . Colon cancer Neg Hx   . Esophageal cancer Neg Hx   . Pancreatic cancer Neg Hx   . Liver disease Neg Hx   . Kidney disease Neg Hx     Social History Social History   Tobacco Use  . Smoking status: Former Smoker    Packs/day: 1.00    Years: 16.00    Pack years: 16.00     Types: Cigarettes    Last attempt to quit: 05/10/1992    Years since quitting: 25.3  . Smokeless tobacco: Never Used  Substance Use Topics  . Alcohol use: No    Alcohol/week: 0.0 oz  . Drug use: No     Allergies   Penicillins   Review of Systems Review of Systems  All other systems reviewed and are negative.    Physical Exam Updated Vital Signs BP 130/74   Pulse 93   Resp 14   Ht 5\' 3"  (1.6 m)   Wt 83.5 kg (184 lb)   SpO2 100%   BMI 32.59 kg/m   Physical Exam  Constitutional: She is oriented to person,  place, and time. She appears well-developed and well-nourished. No distress.  HENT:  Head: Normocephalic and atraumatic.  Eyes: Right eye exhibits no discharge. Left eye exhibits no discharge. No scleral icterus.  Neck: Normal range of motion. Neck supple.  No c spine midline tenderness. No paraspinal tenderness. No deformities or step offs noted. Full ROM. Supple. No nuchal rigidity.    Cardiovascular: Normal rate, regular rhythm, normal heart sounds and intact distal pulses. Exam reveals no gallop and no friction rub.  No murmur heard. Pulmonary/Chest: Effort normal and breath sounds normal. No stridor. No respiratory distress. She has no wheezes. She has no rales. She exhibits no tenderness.  Abdominal: Soft. Bowel sounds are normal. She exhibits no distension. There is no tenderness. There is no rebound and no guarding.  No ecchymosis.  Musculoskeletal:       Right shoulder: She exhibits decreased range of motion, tenderness, bony tenderness, swelling and pain. She exhibits no effusion, no crepitus, no deformity, no laceration, no spasm, normal pulse and normal strength.  No midline T spine or L spine tenderness. No deformities or step offs noted. Full ROM. Pelvis is stable.  Grip strength normal bilaterally.  Axillary nerve intact.  Patient has no obvious deformity.  Minimal edema noted of the right shoulder.  Limited range of motion secondary to pain.    Neurological: She is alert and oriented to person, place, and time.  Skin: Skin is warm and dry. Capillary refill takes less than 2 seconds. No pallor.  Psychiatric: Her behavior is normal. Judgment and thought content normal.  Nursing note and vitals reviewed.    ED Treatments / Results  Labs (all labs ordered are listed, but only abnormal results are displayed) Labs Reviewed  BASIC METABOLIC PANEL - Abnormal; Notable for the following components:      Result Value   Chloride 97 (*)    Glucose, Bld 205 (*)    BUN 148 (*)    Creatinine, Ser 2.53 (*)    Calcium 8.4 (*)    GFR calc non Af Amer 16 (*)    GFR calc Af Amer 19 (*)    Anion gap 16 (*)    All other components within normal limits  CBC WITH DIFFERENTIAL/PLATELET - Abnormal; Notable for the following components:   Hemoglobin 11.5 (*)    Neutro Abs 9.2 (*)    Lymphs Abs 0.5 (*)    All other components within normal limits  CK - Abnormal; Notable for the following components:   Total CK 327 (*)    All other components within normal limits  PROTIME-INR  APTT  BRAIN NATRIURETIC PEPTIDE  TYPE AND SCREEN    EKG None  Radiology Dg Clavicle Right  Result Date: 09/05/2017 CLINICAL DATA:  Pain after trip and fall today. EXAM: RIGHT CLAVICLE - 2+ VIEWS COMPARISON:  None. FINDINGS: No acute clavicular fracture joint dislocation. Acute comminuted fracture of the right humeral head and neck without angulation greater than 45 degrees or displacement greater than 1 cm. The adjacent included ribs and lung are nonacute. Aortic atherosclerosis without aneurysm is seen at the arch. Cervical spondylosis. Bilateral carotid atherosclerosis. IMPRESSION: 1. Negative for acute right clavicular fracture. 2. Positive for acute comminuted fracture of the right humeral head and neck without significant angulation or displacement. Electronically Signed   By: Tollie Eth M.D.   On: 09/05/2017 01:01   Dg Forearm Right  Result Date:  09/05/2017 CLINICAL DATA:  Right arm pain after slip fall today. EXAM: RIGHT  FOREARM - 2 VIEW COMPARISON:  None. FINDINGS: No acute fracture of the forearm. 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 Carpal rows are maintained. Enthesopathy at the triceps insertion on the olecranon. No elbow joint effusion. No radial head fracture. IMPRESSION: No acute fracture or malalignment of the right forearm and included wrist. 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 Spurring at the triceps insertion on the olecranon. Electronically Signed   By: Tollie Eth M.D.   On: 09/05/2017 01:05   Ct Head Wo Contrast  Result Date: 09/05/2017 CLINICAL DATA:  Right shoulder pain after a fall. EXAM: CT HEAD WITHOUT CONTRAST CT MAXILLOFACIAL WITHOUT CONTRAST CT CERVICAL SPINE WITHOUT CONTRAST TECHNIQUE: Multidetector CT imaging of the head, cervical spine, and maxillofacial structures were performed using the standard protocol without intravenous contrast. Multiplanar CT image reconstructions of the cervical spine and maxillofacial structures were also generated. COMPARISON:  Cervical spine radiographs 12/29/2016 FINDINGS: CT HEAD FINDINGS Brain: Diffuse cerebral atrophy. Mild ventricular dilatation consistent with central atrophy. Low-attenuation changes in the deep white matter consistent small vessel ischemia. No mass effect or midline shift. No abnormal extra-axial fluid collections. Gray-white matter junctions are distinct. Basal cisterns are not effaced. No acute intracranial hemorrhage. Vascular: Intracranial arterial vascular calcifications are present. Skull: Calvarium appears intact. No acute depressed skull fractures. Sinuses/Orbits: Paranasal sinuses and mastoid air cells are clear. Other: None. CT MAXILLOFACIAL FINDINGS Osseous: No fracture or mandibular dislocation. No destructive process. Multiple dental caries and tooth extractions. Orbits: Negative. No traumatic or inflammatory finding. Sinuses: Clear.   Concha bullosa of the right middle turbinate. Soft tissues: Negative. CT CERVICAL SPINE FINDINGS Alignment: Normal. Skull base and vertebrae: No acute fracture. No primary bone lesion or focal pathologic process. Soft tissues and spinal canal: No prevertebral fluid or swelling. No visible canal hematoma. Disc levels: Degenerative changes in the cervical spine with narrowed interspaces and endplate hypertrophic changes most prominent at C3-4, C5-6, C6-7, and C7-T1 levels. Degenerative changes in the facet joints. Upper chest: Limited visualization of the lung apices. Vascular calcifications in the cervical carotid arteries. Other: None. IMPRESSION: 1. No acute intracranial abnormalities. Chronic atrophy and small vessel ischemic changes. 2. No acute displaced orbital or facial fractures identified. 3. Normal alignment of the cervical spine. No acute displaced fractures. Degenerative changes. Electronically Signed   By: Burman Nieves M.D.   On: 09/05/2017 00:41   Ct Cervical Spine Wo Contrast  Result Date: 09/05/2017 CLINICAL DATA:  Right shoulder pain after a fall. EXAM: CT HEAD WITHOUT CONTRAST CT MAXILLOFACIAL WITHOUT CONTRAST CT CERVICAL SPINE WITHOUT CONTRAST TECHNIQUE: Multidetector CT imaging of the head, cervical spine, and maxillofacial structures were performed using the standard protocol without intravenous contrast. Multiplanar CT image reconstructions of the cervical spine and maxillofacial structures were also generated. COMPARISON:  Cervical spine radiographs 12/29/2016 FINDINGS: CT HEAD FINDINGS Brain: Diffuse cerebral atrophy. Mild ventricular dilatation consistent with central atrophy. Low-attenuation changes in the deep white matter consistent small vessel ischemia. No mass effect or midline shift. No abnormal extra-axial fluid collections. Gray-white matter junctions are distinct. Basal cisterns are not effaced. No acute intracranial hemorrhage. Vascular: Intracranial arterial vascular  calcifications are present. Skull: Calvarium appears intact. No acute depressed skull fractures. Sinuses/Orbits: Paranasal sinuses and mastoid air cells are clear. Other: None. CT MAXILLOFACIAL FINDINGS Osseous: No fracture or mandibular dislocation. No destructive process. Multiple dental caries and tooth extractions. Orbits: Negative. No traumatic or inflammatory finding. Sinuses: Clear.  Concha bullosa of the right middle turbinate. Soft tissues: Negative. CT  CERVICAL SPINE FINDINGS Alignment: Normal. Skull base and vertebrae: No acute fracture. No primary bone lesion or focal pathologic process. Soft tissues and spinal canal: No prevertebral fluid or swelling. No visible canal hematoma. Disc levels: Degenerative changes in the cervical spine with narrowed interspaces and endplate hypertrophic changes most prominent at C3-4, C5-6, C6-7, and C7-T1 levels. Degenerative changes in the facet joints. Upper chest: Limited visualization of the lung apices. Vascular calcifications in the cervical carotid arteries. Other: None. IMPRESSION: 1. No acute intracranial abnormalities. Chronic atrophy and small vessel ischemic changes. 2. No acute displaced orbital or facial fractures identified. 3. Normal alignment of the cervical spine. No acute displaced fractures. Degenerative changes. Electronically Signed   By: Burman Nieves M.D.   On: 09/05/2017 00:41   Dg Humerus Right  Result Date: 09/05/2017 CLINICAL DATA:  Right shoulder pain after fall today. EXAM: RIGHT HUMERUS - 2+ VIEW COMPARISON:  None. FINDINGS: Acute, comminuted closed fracture of the right surgical neck of the humerus and humeral head in the region of the biceps tubercles. No angulation greater than 45 degrees or displacement greater than 1 cm identified. No joint dislocation. IMPRESSION: Comminuted proximal humerus fracture involving the surgical neck and humeral head without significant angulation or displacement. Electronically Signed   By: Tollie Eth M.D.   On: 09/05/2017 01:02   Ct Maxillofacial Wo Cm  Result Date: 09/05/2017 CLINICAL DATA:  Right shoulder pain after a fall. EXAM: CT HEAD WITHOUT CONTRAST CT MAXILLOFACIAL WITHOUT CONTRAST CT CERVICAL SPINE WITHOUT CONTRAST TECHNIQUE: Multidetector CT imaging of the head, cervical spine, and maxillofacial structures were performed using the standard protocol without intravenous contrast. Multiplanar CT image reconstructions of the cervical spine and maxillofacial structures were also generated. COMPARISON:  Cervical spine radiographs 12/29/2016 FINDINGS: CT HEAD FINDINGS Brain: Diffuse cerebral atrophy. Mild ventricular dilatation consistent with central atrophy. Low-attenuation changes in the deep white matter consistent small vessel ischemia. No mass effect or midline shift. No abnormal extra-axial fluid collections. Gray-white matter junctions are distinct. Basal cisterns are not effaced. No acute intracranial hemorrhage. Vascular: Intracranial arterial vascular calcifications are present. Skull: Calvarium appears intact. No acute depressed skull fractures. Sinuses/Orbits: Paranasal sinuses and mastoid air cells are clear. Other: None. CT MAXILLOFACIAL FINDINGS Osseous: No fracture or mandibular dislocation. No destructive process. Multiple dental caries and tooth extractions. Orbits: Negative. No traumatic or inflammatory finding. Sinuses: Clear.  Concha bullosa of the right middle turbinate. Soft tissues: Negative. CT CERVICAL SPINE FINDINGS Alignment: Normal. Skull base and vertebrae: No acute fracture. No primary bone lesion or focal pathologic process. Soft tissues and spinal canal: No prevertebral fluid or swelling. No visible canal hematoma. Disc levels: Degenerative changes in the cervical spine with narrowed interspaces and endplate hypertrophic changes most prominent at C3-4, C5-6, C6-7, and C7-T1 levels. Degenerative changes in the facet joints. Upper chest: Limited visualization of the  lung apices. Vascular calcifications in the cervical carotid arteries. Other: None. IMPRESSION: 1. No acute intracranial abnormalities. Chronic atrophy and small vessel ischemic changes. 2. No acute displaced orbital or facial fractures identified. 3. Normal alignment of the cervical spine. No acute displaced fractures. Degenerative changes. Electronically Signed   By: Burman Nieves M.D.   On: 09/05/2017 00:41    Procedures Procedures (including critical care time)  Medications Ordered in ED Medications  HYDROcodone-acetaminophen (NORCO/VICODIN) 5-325 MG per tablet 1 tablet (has no administration in time range)  acetaminophen (TYLENOL) tablet 650 mg (has no administration in time range)  loratadine (CLARITIN) tablet 10 mg (has no  administration in time range)  diclofenac sodium (VOLTAREN) 1 % transdermal gel 4 g (has no administration in time range)  furosemide (LASIX) tablet 80 mg (has no administration in time range)  gabapentin (NEURONTIN) capsule 200 mg (has no administration in time range)  guaiFENesin (MUCINEX) 12 hr tablet 1,200 mg (has no administration in time range)  levothyroxine (SYNTHROID, LEVOTHROID) tablet 88 mcg (has no administration in time range)  metoprolol succinate (TOPROL-XL) 24 hr tablet 12.5 mg (has no administration in time range)  sodium chloride (OCEAN) 0.65 % nasal spray (has no administration in time range)  morphine 4 MG/ML injection 1 mg (has no administration in time range)  methocarbamol (ROBAXIN) tablet 500 mg (has no administration in time range)  insulin aspart (novoLOG) injection 0-9 Units (has no administration in time range)  insulin aspart (novoLOG) injection 0-5 Units (has no administration in time range)  heparin injection 5,000 Units (has no administration in time range)  senna-docusate (Senokot-S) tablet 1 tablet (has no administration in time range)  ondansetron (ZOFRAN) injection 4 mg (has no administration in time range)  hydrALAZINE  (APRESOLINE) injection 5 mg (has no administration in time range)  zolpidem (AMBIEN) tablet 5 mg (has no administration in time range)  fentaNYL (SUBLIMAZE) injection 50 mcg (50 mcg Intravenous Given 09/04/17 2338)  fentaNYL (SUBLIMAZE) injection 50 mcg (50 mcg Intravenous Given 09/05/17 0226)  sodium chloride 0.9 % bolus 500 mL (0 mLs Intravenous Stopped 09/05/17 0304)  HYDROcodone-acetaminophen (NORCO/VICODIN) 5-325 MG per tablet 1 tablet (1 tablet Oral Given 09/05/17 0400)     Initial Impression / Assessment and Plan / ED Course  I have reviewed the triage vital signs and the nursing notes.  Pertinent labs & imaging results that were available during my care of the patient were reviewed by me and considered in my medical decision making (see chart for details).     Patient presents to the ED after mechanical fall with right shoulder pain.  Patient was on the ground for 6 hours prior to EMS arrival.  Patient reports pain to the right shoulder with range of motion.  Denies any other pain.  Vital signs are reassuring.  Patient is afebrile, no tachycardia or hypotension is noted.  Patient does have pain with range of motion of the right shoulder.  No obvious deformity.  Neurovascularly intact.  Axillary nerve intact.  There is no other signs of trauma on exam including intra-abdominal, intracranial, intrathoracic.  Given that patient was on the floor for 6 hours will obtain basic lab work and CK to rule out any signs of rhabdomyolysis.  She has known CKD.  No leukocytosis.  Hemoglobin appears at patient's baseline.  Electrolytes are reassuring.  Creatinine is 2.5 which is improved from patient's baseline of 3.  CK is 327.  Patient given small 500 fluid bolus given that her EF was 60%.  Imaging was obtained.  X-ray reveals comminuted proximal humeral head fracture without significant displacement or angulation.  CT of head and neck revealed no acute findings.  No other fractures noted.  Patient is  continued to have significant amount of pain in the ED.  Patient was not able to ambulate without assistance.  Continues to have significant pain.  Daughter was concerned that patient cannot go home and ambulate by herself with pain medication.  I did speak with hospice facility concerning patient.  They felt like the patient would benefit from admission to the hospital for pain control and possibly discharge to rehab facility.  They  also recommended OT and PT.  Will see patient in the hospital.  Discussed with Dr. Clyde Lundborg with hospital medicine who agrees to admission will see patient in the ED and place admission orders for observation.  Patient remains hemodynamically stable and appropriate for admission at this time.  Updated on plan of care.  Patient seen and evaluated my attending who is agreeable with the above plan.  Final Clinical Impressions(s) / ED Diagnoses   Final diagnoses:  Closed nondisplaced fracture of surgical neck of right humerus, unspecified fracture morphology, initial encounter  Fall, initial encounter    ED Discharge Orders    None       Wallace Keller 09/05/17 [id number]    Zadie Rhine, MD 09/05/17 [id number]

## 2017-09-05 NOTE — ED Notes (Signed)
Ordered Breakfast Tray  

## 2017-09-05 NOTE — Consult Note (Signed)
Reason for Consult:Humerus fx Referring Physician: Niajah Sipos is an 84 y.o. female.  HPI: Faryn was tidying up at home and got tangled in some sheets and fell. She had immediate shoulder pain. She came to the ED and x-rays showed a right humeral neck fx. Family was concerned about pain control and ability to manage at home so she was admitted for pain control and PT/OT. Orthopedic surgery was consulted to make sure nothing else needed to be done. She is on hospice for CHF and ESRD. She is RHD.  Past Medical History:  Diagnosis Date  . Allergic rhinitis, cause unspecified 02/14/2013  . Arthritis   . Arthus phenomenon   . Diabetes mellitus, type 2 ([place])   . Hearing loss    right ear, no hearing aid  . History of blood transfusion    [name] - unsure number of units transfused  . Hyperlipidemia   . Hypertension   . Hypothyroidism   . Kidney stones   . Osteoarthritis, knee    knees - otc med prn  . SVD (spontaneous vaginal delivery)    x 4  . Varicose veins    lower legs    Past Surgical History:  Procedure Laterality Date  . ABDOMINAL HYSTERECTOMY    . ANTERIOR AND POSTERIOR REPAIR N/A 04/17/2014   Procedure: ANTERIOR (CYSTOCELE) AND POSTERIOR REPAIR (RECTOCELE);  Surgeon: Cheri Fowler, MD;  Location: [facility];  Service: Gynecology;  Laterality: N/A;  . APPENDECTOMY    . CATARACT EXTRACTION Bilateral   . COLONOSCOPY    . KIDNEY STONE SURGERY     removal of stone  . REPLACEMENT TOTAL KNEE Bilateral   . TONSILLECTOMY    . WISDOM TOOTH EXTRACTION      Family History  Problem Relation Age of Onset  . Diabetes Mother   . Diabetes Father   . Hypertension Father   . Breast cancer Daughter   . Diabetes Brother        x 1  . Diabetes Sister        x 4  . Colon cancer Neg Hx   . Esophageal cancer Neg Hx   . Pancreatic cancer Neg Hx   . Liver disease Neg Hx   . Kidney disease Neg Hx     Social History:  reports that she quit smoking about 25 years ago. Her  smoking use included cigarettes. She has a 16.00 pack-year smoking history. She has never used smokeless tobacco. She reports that she does not drink alcohol or use drugs.  Allergies:  Allergies  Allergen Reactions  . Penicillins Rash    Allergic to IV penicillin but tolerates the oral form Has patient had a PCN reaction causing immediate rash, facial/tongue/throat swelling, SOB or lightheadedness with hypotension: no Has patient had a PCN reaction causing severe rash involving mucus membranes or skin necrosis: no Has patient had a PCN reaction that required hospitalization already in the hospital for other procedure  Has patient had a PCN reaction occurring within the last 10 years: no If all of the above answers are "NO", then ma    Medications: I have reviewed the patient's current medications.  Results for orders placed or performed during the hospital encounter of 09/04/17 (from the past 48 hour(s))  Basic metabolic panel     Status: Abnormal   Collection Time: 09/05/17  1:11 AM  Result Value Ref Range   Sodium 136 135 - 145 mmol/L   Potassium 3.9 3.5 -  5.1 mmol/L   Chloride 97 (L) 101 - 111 mmol/L   CO2 23 22 - 32 mmol/L   Glucose, Bld 205 (H) 65 - 99 mg/dL   BUN 148 (H) 6 - 20 mg/dL   Creatinine, Ser 2.53 (H) 0.44 - 1.00 mg/dL   Calcium 8.4 (L) 8.9 - 10.3 mg/dL   GFR calc non Af Amer 16 (L) >60 mL/min   GFR calc Af Amer 19 (L) >60 mL/min    Comment: (NOTE) The eGFR has been calculated using the CKD EPI equation. This calculation has not been validated in all clinical situations. eGFR's persistently <60 mL/min signify possible Chronic Kidney Disease.    Anion gap 16 (H) 5 - 15    Comment: Performed at [facility], [place] [address], [place], [place] [address]  CBC with Differential     Status: Abnormal   Collection Time: 09/05/17  1:11 AM  Result Value Ref Range   WBC 10.2 4.0 - 10.5 K/uL   RBC 3.91 3.87 - 5.11 MIL/uL   Hemoglobin 11.5 (L) 12.0 - 15.0 g/dL   HCT  36.2 36.0 - 46.0 %   MCV 92.6 78.0 - 100.0 fL   MCH 29.4 26.0 - 34.0 pg   MCHC 31.8 30.0 - 36.0 g/dL   RDW 13.7 11.5 - 15.5 %   Platelets 227 150 - 400 K/uL   Neutrophils Relative % 91 %   Neutro Abs 9.2 (H) 1.7 - 7.7 K/uL   Lymphocytes Relative 5 %   Lymphs Abs 0.5 (L) 0.7 - 4.0 K/uL   Monocytes Relative 4 %   Monocytes Absolute 0.4 0.1 - 1.0 K/uL   Eosinophils Relative 0 %   Eosinophils Absolute 0.0 0.0 - 0.7 K/uL   Basophils Relative 0 %   Basophils Absolute 0.0 0.0 - 0.1 K/uL    Comment: Performed at [facility] [address], [place], [place] [address]  CK     Status: Abnormal   Collection Time: 09/05/17  1:11 AM  Result Value Ref Range   Total CK 327 (H) 38 - 234 U/L    Comment: Performed at [facility], [place] [address] [place], [place] [address]  Protime-INR     Status: None   Collection Time: 09/05/17  6:14 AM  Result Value Ref Range   Prothrombin Time 13.6 11.4 - 15.2 seconds   INR 1.05     Comment: Performed at [facility] [address], [place], [place] [address]  Type and screen [place]     Status: None   Collection Time: 09/05/17  6:14 AM  Result Value Ref Range   ABO/RH(D) AB POS    Antibody Screen NEG    Sample Expiration      09/08/2017 Performed at [facility], [place] [address], [place], [place] [address]   APTT     Status: None   Collection Time: 09/05/17  6:14 AM  Result Value Ref Range   aPTT 33 24 - 36 seconds    Comment: Performed at [facility] [address] [place], [place] [address]  Brain natriuretic peptide     Status: Abnormal   Collection Time: 09/05/17  6:15 AM  Result Value Ref Range   B Natriuretic Peptide 254.4 (H) 0.0 - 100.0 pg/mL    Comment: Performed at [facility] [address], [place], [place] [address]    Dg Clavicle Right  Result Date:  09/05/2017 CLINICAL DATA:  Pain after trip and fall today. EXAM: RIGHT CLAVICLE - 2+ VIEWS COMPARISON:  None. FINDINGS: No  acute clavicular fracture joint dislocation. Acute comminuted fracture of the right humeral head and neck without angulation greater than 45 degrees or displacement greater than 1 cm. The adjacent included ribs and lung are nonacute. Aortic atherosclerosis without aneurysm is seen at the arch. Cervical spondylosis. Bilateral carotid atherosclerosis. IMPRESSION: 1. Negative for acute right clavicular fracture. 2. Positive for acute comminuted fracture of the right humeral head and neck without significant angulation or displacement. Electronically Signed   By: [name] Royalty M.D.   On: 09/05/2017 01:01   Dg Forearm Right  Result Date: 09/05/2017 CLINICAL DATA:  Right arm pain after slip fall today. EXAM: RIGHT FOREARM - 2 VIEW COMPARISON:  None. FINDINGS: No acute fracture of the forearm. 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 Carpal rows are maintained. Enthesopathy at the triceps insertion on the olecranon. No elbow joint effusion. No radial head fracture. IMPRESSION: No acute fracture or malalignment of the right forearm and included wrist. 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 Spurring at the triceps insertion on the olecranon. Electronically Signed   By: [name] Royalty M.D.   On: 09/05/2017 01:05   Ct Head Wo Contrast  Result Date: 09/05/2017 CLINICAL DATA:  Right shoulder pain after a fall. EXAM: CT HEAD WITHOUT CONTRAST CT MAXILLOFACIAL WITHOUT CONTRAST CT CERVICAL SPINE WITHOUT CONTRAST TECHNIQUE: Multidetector CT imaging of the head, cervical spine, and maxillofacial structures were performed using the standard protocol without intravenous contrast. Multiplanar CT image reconstructions of the cervical spine and maxillofacial structures were also generated. COMPARISON:  Cervical spine radiographs 12/29/2016 FINDINGS: CT HEAD FINDINGS Brain: Diffuse cerebral atrophy. Mild ventricular dilatation consistent with central atrophy. Low-attenuation changes in the deep white matter  consistent small vessel ischemia. No mass effect or midline shift. No abnormal extra-axial fluid collections. Gray-white matter junctions are distinct. Basal cisterns are not effaced. No acute intracranial hemorrhage. Vascular: Intracranial arterial vascular calcifications are present. Skull: Calvarium appears intact. No acute depressed skull fractures. Sinuses/Orbits: Paranasal sinuses and mastoid air cells are clear. Other: None. CT MAXILLOFACIAL FINDINGS Osseous: No fracture or mandibular dislocation. No destructive process. Multiple dental caries and tooth extractions. Orbits: Negative. No traumatic or inflammatory finding. Sinuses: Clear.  Concha bullosa of the right middle turbinate. Soft tissues: Negative. CT CERVICAL SPINE FINDINGS Alignment: Normal. Skull base and vertebrae: No acute fracture. No primary bone lesion or focal pathologic process. Soft tissues and spinal canal: No prevertebral fluid or swelling. No visible canal hematoma. Disc levels: Degenerative changes in the cervical spine with narrowed interspaces and endplate hypertrophic changes most prominent at C3-4, C5-6, C6-7, and C7-T1 levels. Degenerative changes in the facet joints. Upper chest: Limited visualization of the lung apices. Vascular calcifications in the cervical carotid arteries. Other: None. IMPRESSION: 1. No acute intracranial abnormalities. Chronic atrophy and small vessel ischemic changes. 2. No acute displaced orbital or facial fractures identified. 3. Normal alignment of the cervical spine. No acute displaced fractures. Degenerative changes. Electronically Signed   By: Lucienne Capers M.D.   On: 09/05/2017 00:41   Ct Cervical Spine Wo Contrast  Result Date: 09/05/2017 CLINICAL DATA:  Right shoulder pain after a fall. EXAM: CT HEAD WITHOUT CONTRAST CT MAXILLOFACIAL WITHOUT CONTRAST CT CERVICAL SPINE WITHOUT CONTRAST TECHNIQUE: Multidetector CT imaging of the head, cervical spine, and maxillofacial structures were performed  using the standard protocol without intravenous contrast. Multiplanar CT image reconstructions of  the cervical spine and maxillofacial structures were also generated. COMPARISON:  Cervical spine radiographs 12/29/2016 FINDINGS: CT HEAD FINDINGS Brain: Diffuse cerebral atrophy. Mild ventricular dilatation consistent with central atrophy. Low-attenuation changes in the deep white matter consistent small vessel ischemia. No mass effect or midline shift. No abnormal extra-axial fluid collections. Gray-white matter junctions are distinct. Basal cisterns are not effaced. No acute intracranial hemorrhage. Vascular: Intracranial arterial vascular calcifications are present. Skull: Calvarium appears intact. No acute depressed skull fractures. Sinuses/Orbits: Paranasal sinuses and mastoid air cells are clear. Other: None. CT MAXILLOFACIAL FINDINGS Osseous: No fracture or mandibular dislocation. No destructive process. Multiple dental caries and tooth extractions. Orbits: Negative. No traumatic or inflammatory finding. Sinuses: Clear.  Concha bullosa of the right middle turbinate. Soft tissues: Negative. CT CERVICAL SPINE FINDINGS Alignment: Normal. Skull base and vertebrae: No acute fracture. No primary bone lesion or focal pathologic process. Soft tissues and spinal canal: No prevertebral fluid or swelling. No visible canal hematoma. Disc levels: Degenerative changes in the cervical spine with narrowed interspaces and endplate hypertrophic changes most prominent at C3-4, C5-6, C6-7, and C7-T1 levels. Degenerative changes in the facet joints. Upper chest: Limited visualization of the lung apices. Vascular calcifications in the cervical carotid arteries. Other: None. IMPRESSION: 1. No acute intracranial abnormalities. Chronic atrophy and small vessel ischemic changes. 2. No acute displaced orbital or facial fractures identified. 3. Normal alignment of the cervical spine. No acute displaced fractures. Degenerative changes.  Electronically Signed   By: Lucienne Capers M.D.   On: 09/05/2017 00:41   Dg Humerus Right  Result Date: 09/05/2017 CLINICAL DATA:  Right shoulder pain after fall today. EXAM: RIGHT HUMERUS - 2+ VIEW COMPARISON:  None. FINDINGS: Acute, comminuted closed fracture of the right surgical neck of the humerus and humeral head in the region of the biceps tubercles. No angulation greater than 45 degrees or displacement greater than 1 cm identified. No joint dislocation. IMPRESSION: Comminuted proximal humerus fracture involving the surgical neck and humeral head without significant angulation or displacement. Electronically Signed   By: [name] Royalty M.D.   On: 09/05/2017 01:02   Ct Maxillofacial Wo Cm  Result Date: 09/05/2017 CLINICAL DATA:  Right shoulder pain after a fall. EXAM: CT HEAD WITHOUT CONTRAST CT MAXILLOFACIAL WITHOUT CONTRAST CT CERVICAL SPINE WITHOUT CONTRAST TECHNIQUE: Multidetector CT imaging of the head, cervical spine, and maxillofacial structures were performed using the standard protocol without intravenous contrast. Multiplanar CT image reconstructions of the cervical spine and maxillofacial structures were also generated. COMPARISON:  Cervical spine radiographs 12/29/2016 FINDINGS: CT HEAD FINDINGS Brain: Diffuse cerebral atrophy. Mild ventricular dilatation consistent with central atrophy. Low-attenuation changes in the deep white matter consistent small vessel ischemia. No mass effect or midline shift. No abnormal extra-axial fluid collections. Gray-white matter junctions are distinct. Basal cisterns are not effaced. No acute intracranial hemorrhage. Vascular: Intracranial arterial vascular calcifications are present. Skull: Calvarium appears intact. No acute depressed skull fractures. Sinuses/Orbits: Paranasal sinuses and mastoid air cells are clear. Other: None. CT MAXILLOFACIAL FINDINGS Osseous: No fracture or mandibular dislocation. No destructive process. Multiple dental caries and tooth  extractions. Orbits: Negative. No traumatic or inflammatory finding. Sinuses: Clear.  Concha bullosa of the right middle turbinate. Soft tissues: Negative. CT CERVICAL SPINE FINDINGS Alignment: Normal. Skull base and vertebrae: No acute fracture. No primary bone lesion or focal pathologic process. Soft tissues and spinal canal: No prevertebral fluid or swelling. No visible canal hematoma. Disc levels: Degenerative changes in the cervical spine with narrowed interspaces and endplate hypertrophic  changes most prominent at C3-4, C5-6, C6-7, and C7-T1 levels. Degenerative changes in the facet joints. Upper chest: Limited visualization of the lung apices. Vascular calcifications in the cervical carotid arteries. Other: None. IMPRESSION: 1. No acute intracranial abnormalities. Chronic atrophy and small vessel ischemic changes. 2. No acute displaced orbital or facial fractures identified. 3. Normal alignment of the cervical spine. No acute displaced fractures. Degenerative changes. Electronically Signed   By: Lucienne Capers M.D.   On: 09/05/2017 00:41    Review of Systems  Constitutional: Negative for weight loss.  HENT: Negative for ear discharge, ear pain, hearing loss and tinnitus.   Eyes: Negative for blurred vision, double vision, photophobia and pain.  Respiratory: Negative for cough, sputum production and shortness of breath.   Cardiovascular: Negative for chest pain.  Gastrointestinal: Negative for abdominal pain, nausea and vomiting.  Genitourinary: Negative for dysuria, flank pain, frequency and urgency.  Musculoskeletal: Positive for joint pain (Right shoulder). Negative for back pain, falls, myalgias and neck pain.  Neurological: Negative for dizziness, tingling, sensory change, focal weakness, loss of consciousness and headaches.  Endo/Heme/Allergies: Does not bruise/bleed easily.  Psychiatric/Behavioral: Negative for depression, memory loss and substance abuse. The patient is not  nervous/anxious.    Blood pressure 128/67, pulse 90, resp. rate 15, height '5\' 3"'$  (1.6 m), weight 83.5 kg (184 lb), SpO2 99 %. Physical Exam  Constitutional: She appears well-developed and well-nourished. No distress.  HENT:  Head: Normocephalic and atraumatic.  Eyes: Conjunctivae are normal. Right eye exhibits no discharge. Left eye exhibits no discharge. No scleral icterus.  Neck: Normal range of motion.  Cardiovascular: Normal rate and regular rhythm.  Respiratory: Effort normal. No respiratory distress.  Musculoskeletal:  Right shoulder, elbow, wrist, digits- no skin wounds, mod TTP shoulder, in sling  Sens  Ax/R/M/U intact  Mot   Ax/ R/ PIN/ M/ AIN/ U intact  Rad 2+  Neurological: She is alert.  Skin: Skin is warm and dry. She is not diaphoretic.  Psychiatric: She has a normal mood and affect. Her behavior is normal.    Assessment/Plan: Fall Right humeral neck fx, likely 3-part -- Acceptable alignment on x-ray, can treat non-operatively in sling with NWB. Will start early motion, should see Dr. Percell Miller in office in about 2 weeks. Multiple medical problems -- per primary service    Michelle Abu, PA-C Orthopedic Surgery [phone] 09/05/2017, 9:54 AM

## 2017-09-05 NOTE — ED Notes (Signed)
Pt's daughter Angelique BlonderDenise [phone] asked to be contacted with updates on pts room changing.

## 2017-09-05 NOTE — Progress Notes (Signed)
Patient admitted after midnight, please see H&P.  Appreciated ortho recommendations: sling, NWB.  Patient is on hospice but was ambulatory prior.  Needs PT/OT assessment and possible SNF.    Michelle CanaryJessica Ruby Logiudice DO

## 2017-09-05 NOTE — Progress Notes (Signed)
[facility] - [facility] ([facility]) - RN Visit at 11:00am.  This is a related and covered hospitalization admission of 09/04/17 with a [facility] diagnosis of Congested Heart Failure per Dr. Barbee ShropshireHertweck of [facility].   Patient fell at 3:30pm on 09/04/17 at home and was unable to get off floor for several hours until found by family at 9:30pm. [facility] notified by daughter Tora DuckSheryl at 10:10pm that EMS was called to home and advised that [facility] for possible broken shoulder.  Patient was given pain medication by EMS for transport.  Family was concerned about pain control and ability to manage patient at home so patient admitted for pain control and PT/OT.    Patient was admitted on 09/04/17 for Right Humeral Fracture  [facility] LOS Day 1  Per Epic Notes: (09/05/17) [facility] Course: Patient was found to have wBC 10.7, stable renal function, CK 327, no fever, heart rate in 90s, oxygen saturation 99% on room air. CT of head, C-spine and maxillofacial negative for acute issues. X-ray of R forearm is negative. No fracture of R forearm by x-ray. Patient was found to have nondisplaced comminuted right humerus head and neck fracture. Pt is placed on tele bed for observation, pain control : morphine prn and percocet. Patient to be admitted to a room on [facility] 10  for PT/OT assessment, pain control and Orthopedic Consult.  Orthopedics Consult: Acceptable alignment on x-ray, can treat non-operatively in sling with NWB. Will start early motion, should see Dr. Eulah PontMurphy in office in about 2 weeks.  Visited patient with daughter Angelique BlonderDenise at bedside. Patient sleepy stating "just got pain medicine" denying complaints of discomfort at this time and in NAD.  Daughter supported and given [facility] contact information then made aware that [facility] will continue to follow patient while in hospital and continue  to anticipate needs.  Encouraged daughter to call as needed.  Daughter did state that " they are not sure if they  want to go to a SNF/Rehab or just take patient back home when ready to be discharged"....will continue to assess discharge needs daily. Patient does have a OOF DNR.     Goals of Care: Pain management and PT/OT/Orthopedic MD assessment.  Communication with [facility]: [facility] team and Primary Physician made aware of admission. [facility] SW updated on patient condition.   [facility] will continue to follow and anticipate discharge needs. Should ambulance transport be needed at time of discharge, please call [facility] as [facility] contracts with them for our patients.  Will place transfer summary and [facility] current medication list on shadow chart upon arrival to 5C10.  Please call with any hospice related questions,  Thank you,  Roda ShuttersSherry Gibson, RN [facility] Hospital Liaison [phone]  [facility] Hospital Liaisons now found on AMION

## 2017-09-05 NOTE — Evaluation (Signed)
Physical Therapy Evaluation Patient Details Name: Michelle Morton MRN: [id number] DOB: 1932/01/30 Today's Date: 09/05/2017   History of Present Illness  Michelle Morton was tidying up at home and got tangled in some sheets and fell. She had immediate shoulder pain. She came to the ED and x-rays showed a right humeral neck fx. Family was concerned about pain control and ability to manage at home so she was admitted for pain control and PT/OT. Orthopedic surgery was consulted to make sure nothing else needed to be done. She is on hospice for CHF and ESRD. She is RHD.  Clinical Impression  Pt admitted with above diagnosis. Pt currently with functional limitations due to the deficits listed below (see PT Problem List). Pt was able to sit edge of stretcher for 5 min and then was dizzy.  BP stable but laid pt down as she was also fatigued.  Will progress as pt able.  Will need SNF.  Will follow acutely.  Pt will benefit from skilled PT to increase their independence and safety with mobility to allow discharge to the venue listed below.      Follow Up Recommendations SNF;Supervision/Assistance - 24 hour    Equipment Recommendations  None recommended by PT    Recommendations for Other Services       Precautions / Restrictions Precautions Precautions: Fall;Shoulder Type of Shoulder Precautions: sling Shoulder Interventions: Shoulder sling/immobilizer;At all times Required Braces or Orthoses: Sling Restrictions Weight Bearing Restrictions: Yes RUE Weight Bearing: Non weight bearing      Mobility  Bed Mobility Overal bed mobility: Needs Assistance Bed Mobility: Supine to Sit     Supine to sit: Mod assist     General bed mobility comments: Assist for LEs and elevation of trunk.   Transfers                 General transfer comment: Pt declined standing as she felt dizzy just sitting on edge of stretcher.    Ambulation/Gait                Stairs            Wheelchair  Mobility    Modified Rankin (Stroke Patients Only)       Balance Overall balance assessment: Needs assistance Sitting-balance support: No upper extremity supported;Feet supported Sitting balance-Leahy Scale: Fair Sitting balance - Comments: cannot accept challenges to balance                                     Pertinent Vitals/Pain Pain Assessment: Faces Faces Pain Scale: Hurts even more Pain Location: right shoulder Pain Descriptors / Indicators: Aching;Grimacing;Guarding Pain Intervention(s): Limited activity within patient's tolerance;Monitored during session;Premedicated before session;Repositioned    Home Living Family/patient expects to be discharged to:: Private residence Living Arrangements: Children Available Help at Discharge: Family;Available PRN/intermittently(lives with daughter who works and is gone 10 hours day) Type of Home: House Home Access: Level entry     Home Layout: Two level Home Equipment: Environmental consultant - 4 wheels;Walker - 2 wheels;Bedside commode;Tub bench;Wheelchair - manual(handrails on bed both sides) Additional Comments: Keeps RW upstairs and rollator downstairs and has a RW in her car    Prior Function Level of Independence: Independent with assistive device(s)         Comments: amb with walker in home; dtr helps with meals, household tasks. Independent with ADLs/selfcare. Daughter who is caregiver is being treated for melanoma.  Pt reports 3 falls in the last few months.     Hand Dominance   Dominant Hand: Right    Extremity/Trunk Assessment   Upper Extremity Assessment Upper Extremity Assessment: Defer to OT evaluation    Lower Extremity Assessment Lower Extremity Assessment: Generalized weakness    Cervical / Trunk Assessment Cervical / Trunk Assessment: Kyphotic  Communication   Communication: No difficulties  Cognition Arousal/Alertness: Awake/alert Behavior During Therapy: WFL for tasks  assessed/performed Overall Cognitive Status: Within Functional Limits for tasks assessed                                        General Comments      Exercises General Exercises - Lower Extremity Ankle Circles/Pumps: AROM;Both;5 reps;Seated Long Arc Quad: AROM;Both;5 reps;Seated   Assessment/Plan    PT Assessment Patient needs continued PT services  PT Problem List Decreased activity tolerance;Decreased balance;Decreased mobility;Decreased knowledge of use of DME;Decreased knowledge of precautions;Decreased safety awareness;Pain;Decreased strength       PT Treatment Interventions DME instruction;Gait training;Functional mobility training;Therapeutic activities;Therapeutic exercise;Balance training;Patient/family education    PT Goals (Current goals can be found in the Care Plan section)  Acute Rehab PT Goals Patient Stated Goal: to go home after rehab PT Goal Formulation: With patient Time For Goal Achievement: 09/19/17 Potential to Achieve Goals: Good    Frequency Min 2X/week   Barriers to discharge Decreased caregiver support daughter works during days and is undergoing treatment for melanoma    Co-evaluation               AM-PAC PT "6 Clicks" Daily Activity  Outcome Measure Difficulty turning over in bed (including adjusting bedclothes, sheets and blankets)?: Unable Difficulty moving from lying on back to sitting on the side of the bed? : Unable Difficulty sitting down on and standing up from a chair with arms (e.g., wheelchair, bedside commode, etc,.)?: Unable Help needed moving to and from a bed to chair (including a wheelchair)?: Total Help needed walking in hospital room?: Total Help needed climbing 3-5 steps with a railing? : Total 6 Click Score: 6    End of Session Equipment Utilized During Treatment: Gait belt Activity Tolerance: Patient limited by fatigue Patient left: in bed;with call bell/phone within reach;with family/visitor  present Nurse Communication: Mobility status;Need for lift equipment PT Visit Diagnosis: Repeated falls (R29.6);Muscle weakness (generalized) (M62.81);Pain Pain - Right/Left: Right Pain - part of body: Shoulder;Arm    Time: [id number] PT Time Calculation (min) (ACUTE ONLY): 25 min   Charges:   PT Evaluation $PT Eval Moderate Complexity: 1 Mod PT Treatments $Therapeutic Activity: 8-22 mins   PT G Codes:        Paxson Harrower,PT Acute Rehabilitation [phone] (pager)   Berline Lopesawn F Jaileigh Weimer 09/05/2017, 1:35 PM

## 2017-09-05 NOTE — ED Notes (Signed)
Diet called for ?

## 2017-09-05 NOTE — ED Notes (Signed)
Pt noted as course breath sounds. Pt staets she uses nasal spray at home. Will contact MD to advise.

## 2017-09-05 NOTE — ED Notes (Signed)
Admitting MD in room with patient 

## 2017-09-05 NOTE — Clinical Social Work Note (Signed)
Clinical Social Work Assessment  Patient Details  Name: Michelle Morton MRN: [id number] Date of Birth: 1931-11-21  Date of referral:  09/05/17               Reason for consult:  Facility Placement                Permission sought to share information with:  Family Supports Permission granted to share information::  Yes, Verbal Permission Granted  Name::     Michelle Morton::  family  Relationship::  daughter  Contact Information:  Michelle Morton [phone]  Housing/Transportation Living arrangements for the past 2 months:  Single Family Home(with daughter Secondary school teacher.) Source of Information:  Patient Patient Interpreter Needed:  None Criminal Activity/Legal Involvement Pertinent to Current Situation/Hospitalization:  No - Comment as needed Significant Relationships:  Adult Children, Other Family Members Lives with:  Self Do you feel safe going back to the place where you live?  Yes Need for family participation in patient care:  Yes (Comment)  Care giving concerns:  CSW spoke with pt at bedside. At this time pt nor daughter are able to identify any concerns. CSW spoke with pt about facility placement and pt as well as daughter expressed wanting [facility]. CSW accepted this but also advised pt and daughter that sometimes facilities cant take pt's due to not being able to meet their needs. Pt and daughter expressed concerns around this but CSW did express that CSW as well as other staff would do their best to ensure that pt's needs are met.    Social Worker assessment / plan:  CSW spoke with pt and daughter (Michelle Morton) at bedside. During this time CSW was informed that pt is from home with daughter Michelle Morton) and have lived with her for three years. Pt reports that pt has a number of supports including daughters as well as grandchildren. Pt expressed being interested in SNF placement at the time of discharge as pt expressed that pt wants to get back up and walking. During this assessment pt  spoke in a low tone that was calm. Pt wore hospital gown and turban on head with no complaints being expressed to CSW at this time.   Employment status:  Retired Nurse, adult PT Recommendations:  Not assessed at this time Information / Referral to community resources:  [facility](spoke with pt about facility placement options ([facility] is first choice). )  Patient/Family's Response to care:  CSW explained role to pt and daughter and both appeared to be agreeable and understating of role at this time. CSW observed that pt's response to care was positive and hopeful for improvement in health as time progresses.   Patient/Family's Understanding of and Emotional Response to Diagnosis, Current Treatment, and Prognosis:  Pt's and daughters understanding of care was appropriate to diagnosis given at this time. Pt's emotional response was grateful and appreciative of support given from CSW as well as daughter at bedside.   Emotional Assessment Appearance:  Appears stated age Attitude/Demeanor/Rapport:  Engaged, Gracious Affect (typically observed):  Appropriate, Pleasant Orientation:  Oriented to Self, Oriented to Place, Oriented to  Time, Oriented to Situation Alcohol / Substance use:  Not Applicable Psych involvement (Current and /or in the community):  No (Comment)  Discharge Needs  Concerns to be addressed:  No discharge needs identified Readmission within the last 30 days:  No Current discharge risk:  Dependent with Mobility Barriers to Discharge:  Continued Medical Work up  Wetzel Bjornstad, LCSWA 09/05/2017, 8:20 AM

## 2017-09-05 NOTE — ED Notes (Signed)
Pt unable to stand for more than 20 seconds. Pt cannot ambulate at this time. Joselyn Glassmanyler, [place] is aware.

## 2017-09-06 ENCOUNTER — Telehealth: Payer: Self-pay | Admitting: Internal Medicine

## 2017-09-06 DIAGNOSIS — Z515 Encounter for palliative care: Secondary | ICD-10-CM

## 2017-09-06 LAB — BASIC METABOLIC PANEL
Anion gap: 15 (ref 5–15)
BUN: 138 mg/dL — AB (ref 6–20)
CALCIUM: 8 mg/dL — AB (ref 8.9–10.3)
CO2: 26 mmol/L (ref 22–32)
CREATININE: 2.73 mg/dL — AB (ref 0.44–1.00)
Chloride: 98 mmol/L — ABNORMAL LOW (ref 101–111)
GFR calc Af Amer: 17 mL/min — ABNORMAL LOW (ref >60)
GFR calc non Af Amer: 15 mL/min — ABNORMAL LOW (ref >60)
GLUCOSE: 99 mg/dL (ref 65–99)
Potassium: 4 mmol/L (ref 3.5–5.1)
Sodium: 139 mmol/L (ref 135–145)

## 2017-09-06 LAB — CBC
HCT: 33.2 % — ABNORMAL LOW (ref 36.0–46.0)
Hemoglobin: 10.4 g/dL — ABNORMAL LOW (ref 12.0–15.0)
MCH: 29.5 pg (ref 26.0–34.0)
MCHC: 31.3 g/dL (ref 30.0–36.0)
MCV: 94.1 fL (ref 78.0–100.0)
Platelets: 211 10*3/uL (ref 150–400)
RBC: 3.53 MIL/uL — ABNORMAL LOW (ref 3.87–5.11)
RDW: 13.9 % (ref 11.5–15.5)
WBC: 8.1 10*3/uL (ref 4.0–10.5)

## 2017-09-06 LAB — GLUCOSE, CAPILLARY
Glucose-Capillary: 108 mg/dL — ABNORMAL HIGH (ref 65–99)
Glucose-Capillary: 235 mg/dL — ABNORMAL HIGH (ref 65–99)
Glucose-Capillary: 186 mg/dL — ABNORMAL HIGH (ref 65–99)
Glucose-Capillary: 199 mg/dL — ABNORMAL HIGH (ref 65–99)

## 2017-09-06 MED ORDER — LINAGLIPTIN 5 MG PO TABS
5.0000 mg | ORAL_TABLET | Freq: Every day | ORAL | Status: DC
  Administered 2017-09-06 – 2017-09-07 (×2): 5 mg via ORAL
  Filled 2017-09-06 (×2): qty 1

## 2017-09-06 NOTE — Evaluation (Signed)
Occupational Therapy Evaluation Patient Details Name: Michelle Morton MRN: [id number] DOB: November 25, 1931 Today's Date: 09/06/2017    History of Present Illness Michelle Morton was tidying up at home and got tangled in some sheets and fell. She had immediate shoulder pain. She came to the ED and x-rays showed a right humeral neck fx. Family was concerned about pain control and ability to manage at home so she was admitted for pain control and PT/OT. Orthopedic surgery was consulted to make sure nothing else needed to be done. She is on hospice for CHF and ESRD. She is RHD.   Clinical Impression   PT admitted with R humeral neck fx. Pt currently with functional limitiations due to the deficits listed below (see OT problem list). Pt very concerned with hospice benefits and the need for SNF at this time. Pt states "I can't believe how long this takes to heal" pt very motivated and asking therapy come early to ensure they come before any other discipline so therapy doesn't have to wait in line. Pt requires total+2 (A) at this time with orthostatic BP. Pt will benefit from skilled OT to increase their independence and safety with adls and balance to allow discharge SNF.     Follow Up Recommendations  SNF    Equipment Recommendations  3 in 1 bedside commode;Hospital bed;Wheelchair cushion (measurements OT);Wheelchair (measurements OT)    Recommendations for Other Services       Precautions / Restrictions Precautions Precautions: Fall;Shoulder Type of Shoulder Precautions: sling Shoulder Interventions: Shoulder sling/immobilizer;At all times Required Braces or Orthoses: Sling Restrictions Weight Bearing Restrictions: Yes RUE Weight Bearing: Non weight bearing      Mobility Bed Mobility               General bed mobility comments: in chair on arrival  Transfers Overall transfer level: Needs assistance   Transfers: Sit to/from Stand Sit to Stand: +2 physical assistance;Mod assist          General transfer comment: requires (A) to power up and cues for hand placement    Balance Overall balance assessment: Needs assistance Sitting-balance support: No upper extremity supported;Feet supported Sitting balance-Leahy Scale: Fair     Standing balance support: Single extremity supported;During functional activity Standing balance-Leahy Scale: Poor                             ADL either performed or assessed with clinical judgement   ADL Overall ADL's : Needs assistance/impaired Eating/Feeding: Set up;Sitting Eating/Feeding Details (indicate cue type and reason): drinking apple juice     Upper Body Bathing: Moderate assistance   Lower Body Bathing: Total assistance   Upper Body Dressing : Moderate assistance   Lower Body Dressing: Total assistance   Toilet Transfer: +2 for physical assistance;Moderate assistance Toilet Transfer Details (indicate cue type and reason): requires (A) to power up and noted to become orthostatic (see vital signs)                 Vision         Perception     Praxis      Pertinent Vitals/Pain Faces Pain Scale: Hurts even more Pain Location: right shoulder Pain Descriptors / Indicators: Aching;Grimacing;Guarding     Hand Dominance Right   Extremity/Trunk Assessment Upper Extremity Assessment Upper Extremity Assessment: RUE deficits/detail RUE Deficits / Details: sling at this time / can have sling lose if fully supported with pillows. sleep in sling  Lower Extremity Assessment Lower Extremity Assessment: Defer to PT evaluation   Cervical / Trunk Assessment Cervical / Trunk Assessment: Kyphotic   Communication Communication Communication: No difficulties   Cognition Arousal/Alertness: Awake/alert Behavior During Therapy: WFL for tasks assessed/performed Overall Cognitive Status: Within Functional Limits for tasks assessed                                     General Comments        Exercises     Shoulder Instructions      Home Living Family/patient expects to be discharged to:: Private residence Living Arrangements: Children Available Help at Discharge: Family;Available PRN/intermittently(lives with daughter who works and is gone 10 hours day) Type of Home: House Home Access: Level entry     Home Layout: Two level Alternate Level Stairs-Number of Steps: 14--has chair glide   Bathroom Shower/Tub: Chief Strategy OfficerTub/shower unit   Bathroom Toilet: Standard     Home Equipment: Environmental consultantWalker - 4 wheels;Walker - 2 wheels;Bedside commode;Tub bench;Wheelchair - manual(handrails on bed both sides)   Additional Comments: Keeps RW upstairs and rollator downstairs and has a RW in her car      Prior Functioning/Environment Level of Independence: Independent with assistive device(s)        Comments: amb with walker in home; dtr helps with meals, household tasks. Independent with ADLs/selfcare. Daughter who is caregiver is being treated for melanoma.  Pt reports 3 falls in the last few months.        OT Problem List: Decreased strength;Decreased activity tolerance;Impaired balance (sitting and/or standing);Decreased safety awareness;Decreased knowledge of use of DME or AE;Decreased knowledge of precautions;Obesity;Pain;Impaired UE functional use;Increased edema      OT Treatment/Interventions: Self-care/ADL training;Therapeutic exercise;DME and/or AE instruction;Therapeutic activities;Patient/family education;Balance training    OT Goals(Current goals can be found in the care plan section) Acute Rehab OT Goals Patient Stated Goal: to go home after rehab OT Goal Formulation: With patient Time For Goal Achievement: 09/20/17 Potential to Achieve Goals: Good  OT Frequency: Min 2X/week   Barriers to D/C: Decreased caregiver support          Co-evaluation              AM-PAC PT "6 Clicks" Daily Activity     Outcome Measure Help from another person eating meals?: A  Little Help from another person taking care of personal grooming?: A Lot Help from another person toileting, which includes using toliet, bedpan, or urinal?: A Lot Help from another person bathing (including washing, rinsing, drying)?: A Lot Help from another person to put on and taking off regular upper body clothing?: A Lot Help from another person to put on and taking off regular lower body clothing?: A Lot 6 Click Score: 13   End of Session Equipment Utilized During Treatment: Gait belt Nurse Communication: Mobility status;Precautions;Weight bearing status  Activity Tolerance: Patient tolerated treatment well Patient left: in chair;with call bell/phone within reach;with chair alarm set  OT Visit Diagnosis: Unsteadiness on feet (R26.81)                Time: [id number] OT Time Calculation (min): 20 min Charges:  OT General Charges $OT Visit: 1 Visit OT Evaluation $OT Eval Moderate Complexity: 1 Mod G-Codes:      Mateo FlowJones, Brynn   OTR/L Pager: [phone] Office: [phone] .   Boone MasterJones, Halen Mossbarger B 09/06/2017, 3:56 PM

## 2017-09-06 NOTE — Progress Notes (Signed)
PROGRESS NOTE    Michelle Morton  ZHY:[id number] DOB: May 28, 1932 DOA: 09/04/2017 PCP: [name], MD   Outpatient Specialists:     Brief Narrative:  Michelle Morton is a 84 y.o. female with medical history significant of hypertension, hyperlipidemia, diabetes mellitus, hypothyroidism, varicose vein, dCHF, CKD-4, and hospice care, who presents with fall and right shoulder pain.     Assessment & Plan:   Principal Problem:   Closed comminuted fracture of right humerus-head and neck Active Problems:   Hypothyroidism   Essential hypertension   Chronic kidney disease (CKD), stage IV (severe) (HCC)   Chronic diastolic CHF (congestive heart failure) (HCC)   DM (diabetes mellitus), type 2 with renal complications (HCC)   Fall   Hospice care patient   Fracture   Fall and closed comminuted fracture of right humerus-head and neck:  -mechanical fall -PT: SNF -family to discuss discharge planning as patient will require more help  Chronic diastolic CHF:  -resume home meds (lasix)  Hypothyroidism:  -Continue home Synthroid  DM (diabetes mellitus), type 2 with renal complications:  -tradjenta -SSI  HTN:  -holding parameters  Chronic kidney disease (CKD), stage IV (severe) (HCC):  -cr 2.5-3 -not a HD candidate  Hospice care patient:  -hospice following    DVT prophylaxis:  SQ Heparin  Code Status: DNR   Family Communication:   Disposition Plan:     Consultants:   Ortho  hospice     Subjective: Pain in arm controlled at rest but bad when up and moving  Objective: Vitals:   09/06/17 1024 09/06/17 1027 09/06/17 1030 09/06/17 1309  BP: 103/63 (!) 88/53 103/63   Pulse: 72 85 76   Resp: 17     Temp:      TempSrc:      SpO2:    92%  Weight:      Height:        Intake/Output Summary (Last 24 hours) at 09/06/2017 1418 Last data filed at 09/06/2017 0850 Gross per 24 hour  Intake 120 ml  Output 1650 ml  Net -1530 ml   Filed Weights   09/04/17  2253 09/05/17 1714 09/05/17 2050  Weight: 83.5 kg (184 lb) 83.5 kg (184 lb) 82.6 kg (182 lb)    Examination:  General exam: on bedside commode, right arm in sling Respiratory system: no increase work of breathing, crackles at [employer] nervous system: alert Psychiatry: mood normal, good eye contact    Data Reviewed: I have personally reviewed following labs and imaging studies  CBC: Recent Labs  Lab 09/05/17 0111 09/06/17 0624  WBC 10.2 8.1  NEUTROABS 9.2*  --   HGB 11.5* 10.4*  HCT 36.2 33.2*  MCV 92.6 94.1  PLT 227 211   Basic Metabolic Panel: Recent Labs  Lab 09/05/17 0111 09/06/17 0624  NA 136 139  K 3.9 4.0  CL 97* 98*  CO2 23 26  GLUCOSE 205* 99  BUN 148* 138*  CREATININE 2.53* 2.73*  CALCIUM 8.4* 8.0*   GFR: Estimated Creatinine Clearance: 15.9 mL/min (A) (by C-G formula based on SCr of 2.73 mg/dL (H)). Liver Function Tests: No results for input(s): AST, ALT, ALKPHOS, BILITOT, PROT, ALBUMIN in the last 168 hours. No results for input(s): LIPASE, AMYLASE in the last 168 hours. No results for input(s): AMMONIA in the last 168 hours. Coagulation Profile: Recent Labs  Lab 09/05/17 0614  INR 1.05   Cardiac Enzymes: Recent Labs  Lab 09/05/17 0111  CKTOTAL 327*   BNP (  last 3 results) No results for input(s): PROBNP in the last 8760 hours. HbA1C: No results for input(s): HGBA1C in the last 72 hours. CBG: Recent Labs  Lab 09/05/17 1531 09/05/17 1726 09/05/17 2055 09/06/17 0729 09/06/17 1222  GLUCAP 82 218* 221* 108* 235*   Lipid Profile: No results for input(s): CHOL, HDL, LDLCALC, TRIG, CHOLHDL, LDLDIRECT in the last 72 hours. Thyroid Function Tests: No results for input(s): TSH, T4TOTAL, FREET4, T3FREE, THYROIDAB in the last 72 hours. Anemia Panel: No results for input(s): VITAMINB12, FOLATE, FERRITIN, TIBC, IRON, RETICCTPCT in the last 72 hours. Urine analysis:    Component Value Date/Time   COLORURINE STRAW (A) 05/06/2017 2245    APPEARANCEUR CLEAR 05/06/2017 2245   LABSPEC 1.009 05/06/2017 2245   PHURINE 6.0 05/06/2017 2245   GLUCOSEU NEGATIVE 05/06/2017 2245   GLUCOSEU NEGATIVE 10/16/2015 1436   HGBUR NEGATIVE 05/06/2017 2245   BILIRUBINUR NEGATIVE 05/06/2017 2245   KETONESUR NEGATIVE 05/06/2017 2245   PROTEINUR NEGATIVE 05/06/2017 2245   UROBILINOGEN 0.2 10/16/2015 1436   NITRITE NEGATIVE 05/06/2017 2245   LEUKOCYTESUR NEGATIVE 05/06/2017 2245     )No results found for this or any previous visit (from the past 240 hour(s)).    Anti-infectives (From admission, onward)   None       Radiology Studies: Dg Clavicle Right  Result Date: 09/05/2017 CLINICAL DATA:  Pain after trip and fall today. EXAM: RIGHT CLAVICLE - 2+ VIEWS COMPARISON:  None. FINDINGS: No acute clavicular fracture joint dislocation. Acute comminuted fracture of the right humeral head and neck without angulation greater than 45 degrees or displacement greater than 1 cm. The adjacent included ribs and lung are nonacute. Aortic atherosclerosis without aneurysm is seen at the arch. Cervical spondylosis. Bilateral carotid atherosclerosis. IMPRESSION: 1. Negative for acute right clavicular fracture. 2. Positive for acute comminuted fracture of the right humeral head and neck without significant angulation or displacement. Electronically Signed   By: Tollie Ethavid  Kwon M.D.   On: 09/05/2017 01:01   Dg Forearm Right  Result Date: 09/05/2017 CLINICAL DATA:  Right arm pain after slip fall today. EXAM: RIGHT FOREARM - 2 VIEW COMPARISON:  None. FINDINGS: No acute fracture of the forearm. 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 Carpal rows are maintained. Enthesopathy at the triceps insertion on the olecranon. No elbow joint effusion. No radial head fracture. IMPRESSION: No acute fracture or malalignment of the right forearm and included wrist. 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 Spurring at the triceps insertion on the olecranon.  Electronically Signed   By: Tollie Ethavid  Kwon M.D.   On: 09/05/2017 01:05   Ct Head Wo Contrast  Result Date: 09/05/2017 CLINICAL DATA:  Right shoulder pain after a fall. EXAM: CT HEAD WITHOUT CONTRAST CT MAXILLOFACIAL WITHOUT CONTRAST CT CERVICAL SPINE WITHOUT CONTRAST TECHNIQUE: Multidetector CT imaging of the head, cervical spine, and maxillofacial structures were performed using the standard protocol without intravenous contrast. Multiplanar CT image reconstructions of the cervical spine and maxillofacial structures were also generated. COMPARISON:  Cervical spine radiographs 12/29/2016 FINDINGS: CT HEAD FINDINGS Brain: Diffuse cerebral atrophy. Mild ventricular dilatation consistent with central atrophy. Low-attenuation changes in the deep white matter consistent small vessel ischemia. No mass effect or midline shift. No abnormal extra-axial fluid collections. Gray-white matter junctions are distinct. Basal cisterns are not effaced. No acute intracranial hemorrhage. Vascular: Intracranial arterial vascular calcifications are present. Skull: Calvarium appears intact. No acute depressed skull fractures. Sinuses/Orbits: Paranasal sinuses and mastoid air cells are clear. Other: None. CT MAXILLOFACIAL  FINDINGS Osseous: No fracture or mandibular dislocation. No destructive process. Multiple dental caries and tooth extractions. Orbits: Negative. No traumatic or inflammatory finding. Sinuses: Clear.  Concha bullosa of the right middle turbinate. Soft tissues: Negative. CT CERVICAL SPINE FINDINGS Alignment: Normal. Skull base and vertebrae: No acute fracture. No primary bone lesion or focal pathologic process. Soft tissues and spinal canal: No prevertebral fluid or swelling. No visible canal hematoma. Disc levels: Degenerative changes in the cervical spine with narrowed interspaces and endplate hypertrophic changes most prominent at C3-4, C5-6, C6-7, and C7-T1 levels. Degenerative changes in the facet joints. Upper chest:  Limited visualization of the lung apices. Vascular calcifications in the cervical carotid arteries. Other: None. IMPRESSION: 1. No acute intracranial abnormalities. Chronic atrophy and small vessel ischemic changes. 2. No acute displaced orbital or facial fractures identified. 3. Normal alignment of the cervical spine. No acute displaced fractures. Degenerative changes. Electronically Signed   By: Burman Nieves M.D.   On: 09/05/2017 00:41   Ct Cervical Spine Wo Contrast  Result Date: 09/05/2017 CLINICAL DATA:  Right shoulder pain after a fall. EXAM: CT HEAD WITHOUT CONTRAST CT MAXILLOFACIAL WITHOUT CONTRAST CT CERVICAL SPINE WITHOUT CONTRAST TECHNIQUE: Multidetector CT imaging of the head, cervical spine, and maxillofacial structures were performed using the standard protocol without intravenous contrast. Multiplanar CT image reconstructions of the cervical spine and maxillofacial structures were also generated. COMPARISON:  Cervical spine radiographs 12/29/2016 FINDINGS: CT HEAD FINDINGS Brain: Diffuse cerebral atrophy. Mild ventricular dilatation consistent with central atrophy. Low-attenuation changes in the deep white matter consistent small vessel ischemia. No mass effect or midline shift. No abnormal extra-axial fluid collections. Gray-white matter junctions are distinct. Basal cisterns are not effaced. No acute intracranial hemorrhage. Vascular: Intracranial arterial vascular calcifications are present. Skull: Calvarium appears intact. No acute depressed skull fractures. Sinuses/Orbits: Paranasal sinuses and mastoid air cells are clear. Other: None. CT MAXILLOFACIAL FINDINGS Osseous: No fracture or mandibular dislocation. No destructive process. Multiple dental caries and tooth extractions. Orbits: Negative. No traumatic or inflammatory finding. Sinuses: Clear.  Concha bullosa of the right middle turbinate. Soft tissues: Negative. CT CERVICAL SPINE FINDINGS Alignment: Normal. Skull base and vertebrae:  No acute fracture. No primary bone lesion or focal pathologic process. Soft tissues and spinal canal: No prevertebral fluid or swelling. No visible canal hematoma. Disc levels: Degenerative changes in the cervical spine with narrowed interspaces and endplate hypertrophic changes most prominent at C3-4, C5-6, C6-7, and C7-T1 levels. Degenerative changes in the facet joints. Upper chest: Limited visualization of the lung apices. Vascular calcifications in the cervical carotid arteries. Other: None. IMPRESSION: 1. No acute intracranial abnormalities. Chronic atrophy and small vessel ischemic changes. 2. No acute displaced orbital or facial fractures identified. 3. Normal alignment of the cervical spine. No acute displaced fractures. Degenerative changes. Electronically Signed   By: Burman Nieves M.D.   On: 09/05/2017 00:41   Dg Humerus Right  Result Date: 09/05/2017 CLINICAL DATA:  Right shoulder pain after fall today. EXAM: RIGHT HUMERUS - 2+ VIEW COMPARISON:  None. FINDINGS: Acute, comminuted closed fracture of the right surgical neck of the humerus and humeral head in the region of the biceps tubercles. No angulation greater than 45 degrees or displacement greater than 1 cm identified. No joint dislocation. IMPRESSION: Comminuted proximal humerus fracture involving the surgical neck and humeral head without significant angulation or displacement. Electronically Signed   By: Tollie Eth M.D.   On: 09/05/2017 01:02   Ct Maxillofacial Wo Cm  Result Date: 09/05/2017 CLINICAL DATA:  Right shoulder pain after a fall. EXAM: CT HEAD WITHOUT CONTRAST CT MAXILLOFACIAL WITHOUT CONTRAST CT CERVICAL SPINE WITHOUT CONTRAST TECHNIQUE: Multidetector CT imaging of the head, cervical spine, and maxillofacial structures were performed using the standard protocol without intravenous contrast. Multiplanar CT image reconstructions of the cervical spine and maxillofacial structures were also generated. COMPARISON:  Cervical spine  radiographs 12/29/2016 FINDINGS: CT HEAD FINDINGS Brain: Diffuse cerebral atrophy. Mild ventricular dilatation consistent with central atrophy. Low-attenuation changes in the deep white matter consistent small vessel ischemia. No mass effect or midline shift. No abnormal extra-axial fluid collections. Gray-white matter junctions are distinct. Basal cisterns are not effaced. No acute intracranial hemorrhage. Vascular: Intracranial arterial vascular calcifications are present. Skull: Calvarium appears intact. No acute depressed skull fractures. Sinuses/Orbits: Paranasal sinuses and mastoid air cells are clear. Other: None. CT MAXILLOFACIAL FINDINGS Osseous: No fracture or mandibular dislocation. No destructive process. Multiple dental caries and tooth extractions. Orbits: Negative. No traumatic or inflammatory finding. Sinuses: Clear.  Concha bullosa of the right middle turbinate. Soft tissues: Negative. CT CERVICAL SPINE FINDINGS Alignment: Normal. Skull base and vertebrae: No acute fracture. No primary bone lesion or focal pathologic process. Soft tissues and spinal canal: No prevertebral fluid or swelling. No visible canal hematoma. Disc levels: Degenerative changes in the cervical spine with narrowed interspaces and endplate hypertrophic changes most prominent at C3-4, C5-6, C6-7, and C7-T1 levels. Degenerative changes in the facet joints. Upper chest: Limited visualization of the lung apices. Vascular calcifications in the cervical carotid arteries. Other: None. IMPRESSION: 1. No acute intracranial abnormalities. Chronic atrophy and small vessel ischemic changes. 2. No acute displaced orbital or facial fractures identified. 3. Normal alignment of the cervical spine. No acute displaced fractures. Degenerative changes. Electronically Signed   By: Burman Nieves M.D.   On: 09/05/2017 00:41        Scheduled Meds: . docusate sodium  100 mg Oral BID  . furosemide  80 mg Oral BID  . gabapentin  200 mg Oral  QHS  . guaiFENesin  1,200 mg Oral BID  . heparin  5,000 Units Subcutaneous Q8H  . insulin aspart  0-5 Units Subcutaneous QHS  . insulin aspart  0-9 Units Subcutaneous TID WC  . levothyroxine  88 mcg Oral QAC breakfast  . loratadine  10 mg Oral Daily  . metoprolol succinate  12.5 mg Oral Daily   Continuous Infusions:   LOS: 1 day    Time spent: 35 min    Joseph Art, DO [facility] Pager [phone]  If 7PM-7AM, please contact night-coverage [email or web address] Password [email or web address] 09/06/2017, 2:18 PM

## 2017-09-06 NOTE — Telephone Encounter (Signed)
Ok for verbal 

## 2017-09-06 NOTE — Progress Notes (Signed)
[facility] ([facility]) - RN Visit at 0930.  This is a related and covered hospitalization admission of 09/04/17 with a [facility] diagnosis of Congested Heart Failure per Dr. Barbee ShropshireHertweck of [facility].   Patient fell at 3:30pm on 09/04/17 at home and was unable to get off floor for several hours until found by family at 9:30pm. [facility] notified by daughter Tora DuckSheryl at 10:10pm that EMS was called to home and advised that [facility] for possible broken shoulder.  Patient was given pain medication by EMS for transport.  Family was concerned about pain control and ability to manage patient at home so patient admitted for pain control and PT/OT.    Patient was admitted on 09/04/17 for Right Humeral Fracture  [facility] LOS Day 2  No new MD notes today at time of visit.  Medications: Lasix 80mg  BID, metoprolol succinate 12.5mg  QD, gabapentin 200mg  @ HS, guaifenesin 1200mg  BID, Heparin 5000u Q8hrs, novolog sliding scale @HS , and TID with meals, levothyroxine 88mcg QD, Claritin 10mg  QD, hydrocodone acetaminophen 5-325mg  Q6hrs prn last given at 1018am.   Visited with patient at bedside. No family present. Patient is alert and oriented, denies pain. She states she is feeling better and is waiting for PT to come back and work with her. She is tolerating POs well and had just finished breakfast. Celedonio MiyamotoKatie Powers, [facility] CSW, had just visited as well.   Goals of Care: Pain management and PT/OT/Orthopedic MD assessment. Patient does have a OOF DNR.  Communication with [facility]: Discussed discharge planning with Celedonio MiyamotoKatie Powers, CSW and [facility]. Family and patient need to decide on how much therapy they wish to pursue and if patient will be going home with family or to SNF for rehab.   [facility] will continue to follow and anticipate discharge needs. Should ambulance transport be needed at time of discharge, please call [facility] as [facility] contracts with them for our patients.   Please call with  any hospice related questions,  Thank you,  Elsie SaasMary Anne Robertson, RN, [facility]  [facility] Hospital Liaison [phone]  [facility] Liaisons now found on AMION

## 2017-09-06 NOTE — Progress Notes (Signed)
[facility] - LCSW visit at 09:05am.  LCSW met with Pt in her room. Pt was alert, oriented, and pleasant, denying concerns with pain. Pt did indicate wheezing when she ate dairy though she was eating yogurt upon LCSW arrival. Pt appeared to eat the majority of her breakfast. LCSW discussed Pt fall with her and discharge plans. Pt indicated openness to going to either of her daughter's homes post discharge and LCSW called Pt daughter, Ivin Booty "Langley Gauss." LCSW spoke with Langley Gauss regarding discharge plan and she notes desire for Pt to go to [facility] for rehabilitation prior to staying with her in her home. She inquired who would arrange this for Pt and LCSW noted that it would be the hospital SW. Pt currently resides with her other daughter, Malachy Mood "[name]." Langley Gauss also discussed CAPS services for Pt along with PT/OT in the home with desire to discuss options. Conversation held regarding revoking [facility] to pursue rehabilitation if this is what is desired. LCSW agreed to speak with floor social worker with update regarding conversation to follow up with options for Pt based on hospital team observations/recommendations. LCSW also noted fairly certain belief that this is a covered [facility] hospital stay for Pt. LCSW spoke with hospital SW, Lorriane Shire, MD, Dr. Eliseo Squires, and hospital [facility] RN liaison, Bevely Palmer, to update.  Christena Deem, Pablo Pena Ext: [phone]

## 2017-09-06 NOTE — Telephone Encounter (Signed)
Copied from CRM [phone]. Topic: General - Other >> Sep 06, 2017  3:33 PM Maia Pettiesrtiz, Kristie S wrote: Reason for CRM: requesting VO to recertify pt for hospice and will be attending physican. She has secure VM on this line [phone] or ok to call Hospice office line directly [phone]

## 2017-09-07 DIAGNOSIS — E083299 Diabetes mellitus due to underlying condition with mild nonproliferative diabetic retinopathy without macular edema, unspecified eye: Secondary | ICD-10-CM

## 2017-09-07 LAB — GLUCOSE, CAPILLARY
GLUCOSE-CAPILLARY: 160 mg/dL — AB (ref 65–99)
Glucose-Capillary: 137 mg/dL — ABNORMAL HIGH (ref 65–99)
Glucose-Capillary: 175 mg/dL — ABNORMAL HIGH (ref 65–99)

## 2017-09-07 MED ORDER — PANTOPRAZOLE SODIUM 40 MG PO TBEC
40.0000 mg | DELAYED_RELEASE_TABLET | Freq: Two times a day (BID) | ORAL | Status: DC
  Administered 2017-09-07: 40 mg via ORAL
  Filled 2017-09-07: qty 1

## 2017-09-07 MED ORDER — ALBUTEROL SULFATE (2.5 MG/3ML) 0.083% IN NEBU: 2.5000 mg | INHALATION_SOLUTION | RESPIRATORY_TRACT | 12 refills | Status: DC | PRN

## 2017-09-07 MED ORDER — LINAGLIPTIN 5 MG PO TABS: 5.0000 mg | ORAL_TABLET | Freq: Every day | ORAL | Status: DC

## 2017-09-07 MED ORDER — ACETAMINOPHEN 325 MG PO TABS: 650.0000 mg | ORAL_TABLET | Freq: Every day | ORAL | Status: AC | PRN

## 2017-09-07 MED ORDER — HYDROCODONE-ACETAMINOPHEN 5-325 MG PO TABS: 1.0000 | ORAL_TABLET | Freq: Four times a day (QID) | ORAL | 0 refills | Status: DC | PRN

## 2017-09-07 MED ORDER — METHOCARBAMOL 500 MG PO TABS: 500.0000 mg | ORAL_TABLET | Freq: Three times a day (TID) | ORAL | 0 refills | Status: DC | PRN

## 2017-09-07 MED ORDER — DOCUSATE SODIUM 100 MG PO CAPS: 100.0000 mg | ORAL_CAPSULE | Freq: Two times a day (BID) | ORAL | 0 refills | Status: AC

## 2017-09-07 MED ORDER — SENNOSIDES-DOCUSATE SODIUM 8.6-50 MG PO TABS: 1.0000 | ORAL_TABLET | Freq: Every evening | ORAL | Status: AC | PRN

## 2017-09-07 MED ORDER — PANTOPRAZOLE SODIUM 40 MG PO TBEC: 40.0000 mg | DELAYED_RELEASE_TABLET | Freq: Two times a day (BID) | ORAL | Status: DC

## 2017-09-07 NOTE — Progress Notes (Signed)
[facility]. Waiting for [facility] to transport pt.

## 2017-09-07 NOTE — Discharge Summary (Signed)
Physician Discharge Summary  CHIQUITTA MATTY WUJ:[id number] DOB: 1931/12/01 DOA: 09/04/2017  PCP: [name], MD  Admit date: 09/04/2017 Discharge date: 09/07/2017   Recommendations for Outpatient Follow-Up:   1. DNR 2. Continue incentive spirometry 3. non-operatively in sling with NWB. Will start early motion, should see Dr. Eulah Pont in office in about 2 weeks.   Discharge Diagnosis:   Principal Problem:   Closed comminuted fracture of right humerus-head and neck Active Problems:   Hypothyroidism   Essential hypertension   Chronic kidney disease (CKD), stage IV (severe) (HCC)   Chronic diastolic CHF (congestive heart failure) (HCC)   DM (diabetes mellitus), type 2 with renal complications (HCC)   Fall   Hospice care patient   Fracture   Discharge disposition:  Home  Discharge Condition: Improved.  Diet recommendation: Low sodium, heart healthy.  Carbohydrate-modified  Wound care: None.   History of Present Illness:   Michelle Morton is a 84 y.o. female with medical history significant of hypertension, hyperlipidemia, diabetes mellitus, hypothyroidism, varicose vein, dCHF, CKD-4, and hospice care, who presents with fall and right shoulder pain.  Per report, pt fell when she used her walker and tripped on the carpet at home at about 15:30. She developed severe pain in right shoulder. She was on the floor for several hours until family back home. No LOC. No head or neck injury. Her left shoulder pain is constant, sharp, 9 out of 10 in severity, nonradiating. No numbness in arms. No Unilateral weakness or numbness or tingling to extremities. No facial droop or slurred speech. Patient denies chest pain, shortness breath, cough. No fever or chills. Denies nausea, vomiting, diarrhea, abdominal pain, symptoms of UTI.     Hospital Course by Problem:   Fall and closed comminuted fracture of right humerus-head and neck:  -mechanical fall -PT: SNF -non-operatively in sling with  NWB. Will start early motion, should see Dr. Eulah Pont in office in about 2 weeks.  Chronic diastolic CHF:  -resume home meds (lasix)  Hypothyroidism:  -Continue home Synthroid  DM (diabetes mellitus), type 2 with renal complications: -tradjenta -carb mod diet  HTN:  -hold meds for low BP  Chronic kidney disease (CKD), stage IV (severe) (HCC): -cr 2.5-3 -not a HD candidate  Hospice care patient: -will revoke while getting rehab      Medical Consultants:    ortho   Discharge Exam:   Vitals:   09/07/17 0544 09/07/17 0953  BP: (!) 99/53 (!) 97/58  Pulse: 74 75  Resp:  18  Temp: 98.3 F (36.8 C) 97.7 F (36.5 C)  SpO2: 98% 100%   Vitals:   09/06/17 1526 09/06/17 2125 09/07/17 0544 09/07/17 0953  BP: (!) 94/52 (!) 111/56 (!) 99/53 (!) 97/58  Pulse: 78 75 74 75  Resp: 18   18  Temp: 98.4 F (36.9 C) 98.1 F (36.7 C) 98.3 F (36.8 C) 97.7 F (36.5 C)  TempSrc: Oral Oral Oral Oral  SpO2: 98% 100% 98% 100%  Weight:  82.5 kg (181 lb 14.1 oz)    Height:        Gen:  NAD    The results of significant diagnostics from this hospitalization (including imaging, microbiology, ancillary and laboratory) are listed below for reference.     Procedures and Diagnostic Studies:   Dg Clavicle Right  Result Date: 09/05/2017 CLINICAL DATA:  Pain after trip and fall today. EXAM: RIGHT CLAVICLE - 2+ VIEWS COMPARISON:  None. FINDINGS: No acute clavicular fracture joint dislocation.  Acute comminuted fracture of the right humeral head and neck without angulation greater than 45 degrees or displacement greater than 1 cm. The adjacent included ribs and lung are nonacute. Aortic atherosclerosis without aneurysm is seen at the arch. Cervical spondylosis. Bilateral carotid atherosclerosis. IMPRESSION: 1. Negative for acute right clavicular fracture. 2. Positive for acute comminuted fracture of the right humeral head and neck without significant angulation or displacement.  Electronically Signed   By: Tollie Eth M.D.   On: 09/05/2017 01:01   Dg Forearm Right  Result Date: 09/05/2017 CLINICAL DATA:  Right arm pain after slip fall today. EXAM: RIGHT FOREARM - 2 VIEW COMPARISON:  None. FINDINGS: No acute fracture of the forearm. 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 Carpal rows are maintained. Enthesopathy at the triceps insertion on the olecranon. No elbow joint effusion. No radial head fracture. IMPRESSION: No acute fracture or malalignment of the right forearm and included wrist. 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 Spurring at the triceps insertion on the olecranon. Electronically Signed   By: Tollie Eth M.D.   On: 09/05/2017 01:05   Ct Head Wo Contrast  Result Date: 09/05/2017 CLINICAL DATA:  Right shoulder pain after a fall. EXAM: CT HEAD WITHOUT CONTRAST CT MAXILLOFACIAL WITHOUT CONTRAST CT CERVICAL SPINE WITHOUT CONTRAST TECHNIQUE: Multidetector CT imaging of the head, cervical spine, and maxillofacial structures were performed using the standard protocol without intravenous contrast. Multiplanar CT image reconstructions of the cervical spine and maxillofacial structures were also generated. COMPARISON:  Cervical spine radiographs 12/29/2016 FINDINGS: CT HEAD FINDINGS Brain: Diffuse cerebral atrophy. Mild ventricular dilatation consistent with central atrophy. Low-attenuation changes in the deep white matter consistent small vessel ischemia. No mass effect or midline shift. No abnormal extra-axial fluid collections. Gray-white matter junctions are distinct. Basal cisterns are not effaced. No acute intracranial hemorrhage. Vascular: Intracranial arterial vascular calcifications are present. Skull: Calvarium appears intact. No acute depressed skull fractures. Sinuses/Orbits: Paranasal sinuses and mastoid air cells are clear. Other: None. CT MAXILLOFACIAL FINDINGS Osseous: No fracture or mandibular dislocation. No destructive process. Multiple  dental caries and tooth extractions. Orbits: Negative. No traumatic or inflammatory finding. Sinuses: Clear.  Concha bullosa of the right middle turbinate. Soft tissues: Negative. CT CERVICAL SPINE FINDINGS Alignment: Normal. Skull base and vertebrae: No acute fracture. No primary bone lesion or focal pathologic process. Soft tissues and spinal canal: No prevertebral fluid or swelling. No visible canal hematoma. Disc levels: Degenerative changes in the cervical spine with narrowed interspaces and endplate hypertrophic changes most prominent at C3-4, C5-6, C6-7, and C7-T1 levels. Degenerative changes in the facet joints. Upper chest: Limited visualization of the lung apices. Vascular calcifications in the cervical carotid arteries. Other: None. IMPRESSION: 1. No acute intracranial abnormalities. Chronic atrophy and small vessel ischemic changes. 2. No acute displaced orbital or facial fractures identified. 3. Normal alignment of the cervical spine. No acute displaced fractures. Degenerative changes. Electronically Signed   By: Burman Nieves M.D.   On: 09/05/2017 00:41   Ct Cervical Spine Wo Contrast  Result Date: 09/05/2017 CLINICAL DATA:  Right shoulder pain after a fall. EXAM: CT HEAD WITHOUT CONTRAST CT MAXILLOFACIAL WITHOUT CONTRAST CT CERVICAL SPINE WITHOUT CONTRAST TECHNIQUE: Multidetector CT imaging of the head, cervical spine, and maxillofacial structures were performed using the standard protocol without intravenous contrast. Multiplanar CT image reconstructions of the cervical spine and maxillofacial structures were also generated. COMPARISON:  Cervical spine radiographs 12/29/2016 FINDINGS: CT HEAD FINDINGS Brain: Diffuse cerebral atrophy. Mild ventricular dilatation consistent  with central atrophy. Low-attenuation changes in the deep white matter consistent small vessel ischemia. No mass effect or midline shift. No abnormal extra-axial fluid collections. Gray-white matter junctions are distinct.  Basal cisterns are not effaced. No acute intracranial hemorrhage. Vascular: Intracranial arterial vascular calcifications are present. Skull: Calvarium appears intact. No acute depressed skull fractures. Sinuses/Orbits: Paranasal sinuses and mastoid air cells are clear. Other: None. CT MAXILLOFACIAL FINDINGS Osseous: No fracture or mandibular dislocation. No destructive process. Multiple dental caries and tooth extractions. Orbits: Negative. No traumatic or inflammatory finding. Sinuses: Clear.  Concha bullosa of the right middle turbinate. Soft tissues: Negative. CT CERVICAL SPINE FINDINGS Alignment: Normal. Skull base and vertebrae: No acute fracture. No primary bone lesion or focal pathologic process. Soft tissues and spinal canal: No prevertebral fluid or swelling. No visible canal hematoma. Disc levels: Degenerative changes in the cervical spine with narrowed interspaces and endplate hypertrophic changes most prominent at C3-4, C5-6, C6-7, and C7-T1 levels. Degenerative changes in the facet joints. Upper chest: Limited visualization of the lung apices. Vascular calcifications in the cervical carotid arteries. Other: None. IMPRESSION: 1. No acute intracranial abnormalities. Chronic atrophy and small vessel ischemic changes. 2. No acute displaced orbital or facial fractures identified. 3. Normal alignment of the cervical spine. No acute displaced fractures. Degenerative changes. Electronically Signed   By: Burman Nieves M.D.   On: 09/05/2017 00:41   Dg Humerus Right  Result Date: 09/05/2017 CLINICAL DATA:  Right shoulder pain after fall today. EXAM: RIGHT HUMERUS - 2+ VIEW COMPARISON:  None. FINDINGS: Acute, comminuted closed fracture of the right surgical neck of the humerus and humeral head in the region of the biceps tubercles. No angulation greater than 45 degrees or displacement greater than 1 cm identified. No joint dislocation. IMPRESSION: Comminuted proximal humerus fracture involving the surgical  neck and humeral head without significant angulation or displacement. Electronically Signed   By: Tollie Eth M.D.   On: 09/05/2017 01:02   Ct Maxillofacial Wo Cm  Result Date: 09/05/2017 CLINICAL DATA:  Right shoulder pain after a fall. EXAM: CT HEAD WITHOUT CONTRAST CT MAXILLOFACIAL WITHOUT CONTRAST CT CERVICAL SPINE WITHOUT CONTRAST TECHNIQUE: Multidetector CT imaging of the head, cervical spine, and maxillofacial structures were performed using the standard protocol without intravenous contrast. Multiplanar CT image reconstructions of the cervical spine and maxillofacial structures were also generated. COMPARISON:  Cervical spine radiographs 12/29/2016 FINDINGS: CT HEAD FINDINGS Brain: Diffuse cerebral atrophy. Mild ventricular dilatation consistent with central atrophy. Low-attenuation changes in the deep white matter consistent small vessel ischemia. No mass effect or midline shift. No abnormal extra-axial fluid collections. Gray-white matter junctions are distinct. Basal cisterns are not effaced. No acute intracranial hemorrhage. Vascular: Intracranial arterial vascular calcifications are present. Skull: Calvarium appears intact. No acute depressed skull fractures. Sinuses/Orbits: Paranasal sinuses and mastoid air cells are clear. Other: None. CT MAXILLOFACIAL FINDINGS Osseous: No fracture or mandibular dislocation. No destructive process. Multiple dental caries and tooth extractions. Orbits: Negative. No traumatic or inflammatory finding. Sinuses: Clear.  Concha bullosa of the right middle turbinate. Soft tissues: Negative. CT CERVICAL SPINE FINDINGS Alignment: Normal. Skull base and vertebrae: No acute fracture. No primary bone lesion or focal pathologic process. Soft tissues and spinal canal: No prevertebral fluid or swelling. No visible canal hematoma. Disc levels: Degenerative changes in the cervical spine with narrowed interspaces and endplate hypertrophic changes most prominent at C3-4, C5-6, C6-7,  and C7-T1 levels. Degenerative changes in the facet joints. Upper chest: Limited visualization of the lung apices. Vascular calcifications  in the cervical carotid arteries. Other: None. IMPRESSION: 1. No acute intracranial abnormalities. Chronic atrophy and small vessel ischemic changes. 2. No acute displaced orbital or facial fractures identified. 3. Normal alignment of the cervical spine. No acute displaced fractures. Degenerative changes. Electronically Signed   By: Burman Nieves M.D.   On: 09/05/2017 00:41     Labs:   Basic Metabolic Panel: Recent Labs  Lab 09/05/17 0111 09/06/17 0624  NA 136 139  K 3.9 4.0  CL 97* 98*  CO2 23 26  GLUCOSE 205* 99  BUN 148* 138*  CREATININE 2.53* 2.73*  CALCIUM 8.4* 8.0*   GFR Estimated Creatinine Clearance: 15.9 mL/min (A) (by C-G formula based on SCr of 2.73 mg/dL (H)). Liver Function Tests: No results for input(s): AST, ALT, ALKPHOS, BILITOT, PROT, ALBUMIN in the last 168 hours. No results for input(s): LIPASE, AMYLASE in the last 168 hours. No results for input(s): AMMONIA in the last 168 hours. Coagulation profile Recent Labs  Lab 09/05/17 0614  INR 1.05    CBC: Recent Labs  Lab 09/05/17 0111 09/06/17 0624  WBC 10.2 8.1  NEUTROABS 9.2*  --   HGB 11.5* 10.4*  HCT 36.2 33.2*  MCV 92.6 94.1  PLT 227 211   Cardiac Enzymes: Recent Labs  Lab 09/05/17 0111  CKTOTAL 327*   BNP: Invalid input(s): POCBNP CBG: Recent Labs  Lab 09/06/17 1222 09/06/17 1721 09/06/17 2123 09/07/17 0823 09/07/17 1222  GLUCAP 235* 186* 199* 160* 137*   D-Dimer No results for input(s): DDIMER in the last 72 hours. Hgb A1c No results for input(s): HGBA1C in the last 72 hours. Lipid Profile No results for input(s): CHOL, HDL, LDLCALC, TRIG, CHOLHDL, LDLDIRECT in the last 72 hours. Thyroid function studies No results for input(s): TSH, T4TOTAL, T3FREE, THYROIDAB in the last 72 hours.  Invalid input(s): FREET3 Anemia work up No results  for input(s): VITAMINB12, FOLATE, FERRITIN, TIBC, IRON, RETICCTPCT in the last 72 hours. Microbiology No results found for this or any previous visit (from the past 240 hour(s)).   Discharge Instructions:   Discharge Instructions    Diet - low sodium heart healthy   Complete by:  As directed    Diet Carb Modified   Complete by:  As directed    Discharge instructions   Complete by:  As directed    DNR Continue incentive spirometry non-operatively in sling with NWB. Will start early motion, should see Dr. Eulah Pont in office in about 2 weeks.   Increase activity slowly   Complete by:  As directed      Allergies as of 09/07/2017      Reactions   Penicillins Rash   Allergic to IV penicillin but tolerates the oral form Has patient had a PCN reaction causing immediate rash, facial/tongue/throat swelling, SOB or lightheadedness with hypotension: no Has patient had a PCN reaction causing severe rash involving mucus membranes or skin necrosis: no Has patient had a PCN reaction that required hospitalization already in the hospital for other procedure  Has patient had a PCN reaction occurring within the last 10 years: no If all of the above answers are "NO", then ma      Medication List    STOP taking these medications   JANUVIA 50 MG tablet Generic drug:  sitaGLIPtin Replaced by:  linagliptin 5 MG Tabs tablet     TAKE these medications   acetaminophen 325 MG tablet Commonly known as:  TYLENOL Take 2 tablets (650 mg total) by mouth daily as  needed for mild pain, fever or headache. What changed:    medication strength  how much to take  reasons to take this   albuterol (2.5 MG/3ML) 0.083% nebulizer solution Commonly known as:  PROVENTIL Take 3 mLs (2.5 mg total) by nebulization every 4 (four) hours as needed for wheezing or shortness of breath.   cetirizine 10 MG tablet Commonly known as:  ZYRTEC Take 10 mg by mouth daily as needed for allergies.   diclofenac sodium 1 %  Gel Commonly known as:  VOLTAREN APPLY 4 GRAMS TOPICALLY FOUR TIMES DAILY as needed What changed:    how much to take  how to take this  when to take this  reasons to take this  additional instructions   docusate sodium 100 MG capsule Commonly known as:  COLACE Take 1 capsule (100 mg total) by mouth 2 (two) times daily.   furosemide 80 MG tablet Commonly known as:  LASIX Take 1 tablet (80 mg total) by mouth 2 (two) times daily.   gabapentin 100 MG capsule Commonly known as:  NEURONTIN Take 2 capsules (200 mg total) by mouth at bedtime.   guaiFENesin 600 MG 12 hr tablet Commonly known as:  MUCINEX Take 1,200 mg by mouth daily.   HYDROcodone-acetaminophen 5-325 MG tablet Commonly known as:  NORCO/VICODIN Take 1 tablet by mouth every 6 (six) hours as needed for severe pain.   levothyroxine 88 MCG tablet Commonly known as:  SYNTHROID, LEVOTHROID Take 1 tablet (88 mcg total) by mouth daily.   linagliptin 5 MG Tabs tablet Commonly known as:  TRADJENTA Take 1 tablet (5 mg total) by mouth daily. Start taking on:  09/08/2017 Replaces:  JANUVIA 50 MG tablet   methocarbamol 500 MG tablet Commonly known as:  ROBAXIN Take 1 tablet (500 mg total) by mouth every 8 (eight) hours as needed for muscle spasms.   metoprolol succinate 25 MG 24 hr tablet Commonly known as:  TOPROL-XL Take 0.5 tablets (12.5 mg total) by mouth daily.   NASAL SPRAY NA Place 1 spray into the nose 2 (two) times daily as needed (allergies).   pantoprazole 40 MG tablet Commonly known as:  PROTONIX Take 1 tablet (40 mg total) by mouth 2 (two) times daily.   senna-docusate 8.6-50 MG tablet Commonly known as:  Senokot-S Take 1 tablet by mouth at bedtime as needed for mild constipation.         Time coordinating discharge: 35 min  Signed:  Joseph ArtJessica U Tucker Minter   [facility] 09/07/2017, 2:51 PM

## 2017-09-07 NOTE — Clinical Social Work Placement (Addendum)
   CLINICAL SOCIAL WORK PLACEMENT  NOTE 09/07/17 - [facility]  Date:  09/07/2017  Patient Details  Name: Michelle Morton MRN: [id number] Date of Birth: [birth year]  Clinical Social Work is seeking post-discharge placement for this patient at the Skilled  Nursing Facility level of care (*CSW will initial, date and re-position this form in  chart as items are completed):  Yes   Patient/family provided with [facility] Clinical Social Work Department's list of facilities offering this level of care within the geographic area requested by the patient (or if unable, by the patient's family).  Yes   Patient/family informed of their freedom to choose among providers that offer the needed level of care, that participate in Medicare, Medicaid or managed care program needed by the patient, have an available bed and are willing to accept the patient.  Yes   Patient/family informed of 's ownership interest in [facility] and [facility], as well as of the fact that they are under no obligation to receive care at these facilities.  PASRR submitted to EDS on       PASRR number received on       Existing PASRR number confirmed on 09/06/17     FL2 transmitted to all facilities in geographic area requested by pt/family on 09/07/17     FL2 transmitted to all facilities within larger geographic area on       Patient informed that his/her managed care company has contracts with or will negotiate with certain facilities, including the following:        Yes   Patient/family informed of bed offers received.  [facility]     Physician recommends and patient chooses bed at      [facility].  Patient to be transferred to facility by Ambulance     Patient family notified on 09/07/17 of transfer.  Name of family member notified:  Daughter Michelle Morton at the  bedside and daughter Michelle Morton by phone.  PHYSICIAN      Additional Comment:  09/07/17 Joetta Manners- [facility] admissions director received insurance authorization.   _______________________________________________ Cristobal Goldmannrawford, Takeisha Cianci Bradley, LCSW 09/07/2017, 5:05 PM

## 2017-09-07 NOTE — Telephone Encounter (Signed)
Verbal orders given  

## 2017-09-07 NOTE — Progress Notes (Signed)
[facility]ia[facility]: RN visit  Visited with patient and daughter, Lenetta QuakerDenise Glasco to complete paperwork for revocation of [facility] hospice services. Patient plans to pursue therapies outside the hospice POC.  Genelle BalVanessa Crawford, CSW notified.  Please call for any hospice related questions.  Thank you,  Elsie SaasMary Anne Robertson, RN, [facility] Liaison [phone]  [facility] hospital liaisons are on AMION.

## 2017-09-07 NOTE — Progress Notes (Signed)
[facility] Social Worker Visit 01:20pm.   LCSW visited with Pt in her room to discuss revocation and plan to go to [facility] for rehabilitation. Pt was eating when LCSW arrived and was having difficulty. LCSW discussed revocation and Pt noted that she would not sign without one of her daughters present. LCSW attempted call to Pt daughter, Michelle Morton, 2x and her other daughter, Michelle Morton, is working until 8:30pm. LCSW coordinated with CSW, Erie NoeVanessa, with update and she was able to speak with [facility] staff member, which is where Pt daughter was currently completing paperwork for Pt admission. LCSW spoke with Michelle Blonderenise via phone and she noted desire to speak with Pt to encourage her to sign the form for revocation. LCSW agreed to return her call once in Pt room but when LCSW returned, another hospital staff member was helping Pt to eat and Pt noted that she was having a "procedure" done soon. LCSW inquired about this and she would not discuss it, noting that she needed to use the rest room and LCSW could return afterwards. LCSW noted need to leave and stated that either LCSW or hospital [facility] RN, Michelle Morton, would follow up regarding revocation. LCSW made Pt daughter aware via phone Michelle Blonder(Denise) and discussion regarding options was held. LCSW agreed to provide revocation paperwork to Michelle Morton who agreed to follow up with Pt and daughters as needed to obtain signature on revocation forms. LCSW updated CSW.   Thank you,  Michelle MiyamotoKatie Powers, LCSW [facility] [phone] ext: [phone]

## 2017-09-07 NOTE — NC FL2 (Addendum)
[place] MEDICAID FL2 LEVEL OF CARE SCREENING TOOL     IDENTIFICATION  Patient Name: Michelle Morton Birthdate: [birth year] Sex: female Admission Date (Current Location): 09/04/2017  [facility] and [place] Number:  Producer, television/film/video and Address:  [facility], [address], [place], [place] [address]      Provider Number: [id number]  Attending Physician Name and Address:  Joseph Art, DO  Relative Name and Phone Number:  Michelle Morton - daughter - [phone] and Michelle Morton - daughter - [phone]    Current Level of Care: Hospital Recommended Level of Care: Skilled Nursing Facility Prior Approval Number:    Date Approved/Denied:   PASRR Number: [id number]  Discharge Plan: SNF    Current Diagnoses: Patient Active Problem List   Diagnosis Date Noted  . Fall 09/05/2017  . Closed comminuted fracture of right humerus-head and neck 09/05/2017  . Hospice care patient 09/05/2017  . Fracture 09/05/2017  . Eustachian tube dysfunction, left 08/22/2017  . Palliative care encounter   . Chronic diastolic CHF (congestive heart failure) (HCC) 06/04/2017  . DM (diabetes mellitus), type 2 with renal complications (HCC) 06/04/2017  . Weight gain 06/03/2017  . Acute diastolic CHF (congestive heart failure) (HCC) 05/07/2017  . Bilateral lower extremity edema 05/06/2017  . Osteoporosis 01/26/2017  . Degenerative disc disease, lumbar 12/29/2016  . Neck pain on left side 12/12/2016  . Leg cramps 06/05/2016  . General weakness 03/04/2016  . Hypoglycemia 12/17/2015  . RLS (restless legs syndrome) 12/17/2015  . Weight loss 12/17/2015  . Back pain 10/16/2015  . Right-sided chest pain 03/27/2015  . Diarrhea 03/27/2015  . Hoarseness 03/27/2015  . Loss of weight 03/27/2015  . Depression 01/07/2015  . Chronic kidney disease (CKD), stage IV (severe) (HCC) 01/07/2015  . AKI (acute kidney injury) (HCC) 10/09/2014  . Uterine prolaps 09/01/2013  . Peripheral  edema 02/14/2013  . Allergic rhinitis 02/14/2013  . Obesity, Class II, BMI 35-39.9 05/12/2011  . Preventative health care 05/12/2011  . TINNITUS 01/09/2009  . Essential hypertension 05/05/2007  . Hypothyroidism 02/28/2007  . Diabetes (HCC) 02/28/2007  . Hyperlipidemia 02/28/2007  . Osteoarthrosis, unspecified whether generalized or localized, involving lower leg 02/28/2007    Orientation RESPIRATION BLADDER Height & Weight     Self, Time, Situation, Place  Normal Incontinent, External catheter Weight: 181 lb 14.1 oz (82.5 kg) Height:  5\' 3"  (160 cm)  BEHAVIORAL SYMPTOMS/MOOD NEUROLOGICAL BOWEL NUTRITION STATUS      Continent Diet(Carb modified)  AMBULATORY STATUS COMMUNICATION OF NEEDS Skin   Total Care(Patient did not walked with PT during evaluation) Verbally Normal                       Personal Care Assistance Level of Assistance  Bathing, Feeding, Dressing Bathing Assistance: Maximum assistance Feeding assistance: Limited assistance(assistance with set-up) Dressing Assistance: Maximum assistance     Functional Limitations Info  Sight, Hearing, Speech Sight Info: Adequate Hearing Info: Impaired Speech Info: Adequate    SPECIAL CARE FACTORS FREQUENCY  PT (By licensed PT), OT (By licensed OT)     PT Frequency: Evaluated 4/8 OT Frequency: Evaluated 4/9            Contractures Contractures Info: Not present    Additional Factors Info  Code Status, Allergies, Insulin Sliding Scale Code Status Info: DNR Allergies Info: Penicillins   Insulin Sliding Scale Info: 0-5 Units daily at bedtime; 0-9 Units 3X a day with meals  Current Medications (09/07/2017):  This is the current hospital active medication list Current Facility-Administered Medications  Medication Dose Route Frequency Provider Last Rate Last Dose  . acetaminophen (TYLENOL) tablet 650 mg  650 mg Oral Daily PRN Lorretta HarpNiu, Xilin, MD   650 mg at 09/06/17 2123  . albuterol (PROVENTIL) (2.5 MG/3ML)  0.083% nebulizer solution 2.5 mg  2.5 mg Nebulization Q4H PRN Marlin CanaryVann, Jessica U, DO   2.5 mg at 09/06/17 1309  . diclofenac sodium (VOLTAREN) 1 % transdermal gel 4 g  4 g Topical QID PRN Lorretta HarpNiu, Xilin, MD      . docusate sodium (COLACE) capsule 100 mg  100 mg Oral BID Vann, Jessica U, DO   100 mg at 09/06/17 2122  . furosemide (LASIX) tablet 80 mg  80 mg Oral BID Lorretta HarpNiu, Xilin, MD   80 mg at 09/06/17 1740  . gabapentin (NEURONTIN) capsule 200 mg  200 mg Oral QHS Lorretta HarpNiu, Xilin, MD   200 mg at 09/06/17 2123  . guaiFENesin (MUCINEX) 12 hr tablet 1,200 mg  1,200 mg Oral BID Marlin CanaryVann, Jessica U, DO   1,200 mg at 09/06/17 2122  . heparin injection 5,000 Units  5,000 Units Subcutaneous Q8H Lorretta HarpNiu, Xilin, MD   5,000 Units at 09/07/17 [phone]  . hydrALAZINE (APRESOLINE) injection 5 mg  5 mg Intravenous Q2H PRN Lorretta HarpNiu, Xilin, MD      . HYDROcodone-acetaminophen (NORCO/VICODIN) 5-325 MG per tablet 1 tablet  1 tablet Oral Q6H PRN Lorretta HarpNiu, Xilin, MD   1 tablet at 09/06/17 1018  . insulin aspart (novoLOG) injection 0-5 Units  0-5 Units Subcutaneous QHS Lorretta HarpNiu, Xilin, MD   2 Units at 09/05/17 2241  . insulin aspart (novoLOG) injection 0-9 Units  0-9 Units Subcutaneous TID WC Vann, Jessica U, DO   2 Units at 09/06/17 1740  . levothyroxine (SYNTHROID, LEVOTHROID) tablet 88 mcg  88 mcg Oral QAC breakfast Lorretta HarpNiu, Xilin, MD   88 mcg at 09/06/17 0850  . linagliptin (TRADJENTA) tablet 5 mg  5 mg Oral Daily Vann, Jessica U, DO   5 mg at 09/06/17 1600  . loratadine (CLARITIN) tablet 10 mg  10 mg Oral Daily Lorretta HarpNiu, Xilin, MD   10 mg at 09/06/17 1019  . methocarbamol (ROBAXIN) tablet 500 mg  500 mg Oral Q8H PRN Lorretta HarpNiu, Xilin, MD      . metoprolol succinate (TOPROL-XL) 24 hr tablet 12.5 mg  12.5 mg Oral Daily Lorretta HarpNiu, Xilin, MD   12.5 mg at 09/05/17 1342  . morphine 4 MG/ML injection 1 mg  1 mg Intravenous Q3H PRN Lorretta HarpNiu, Xilin, MD      . senna-docusate (Senokot-S) tablet 1 tablet  1 tablet Oral QHS PRN Lorretta HarpNiu, Xilin, MD      . sodium chloride (OCEAN) 0.65 % nasal spray    Nasal BID PRN Lorretta HarpNiu, Xilin, MD      . zolpidem (AMBIEN) tablet 5 mg  5 mg Oral QHS PRN Lorretta HarpNiu, Xilin, MD         Discharge Medications: Please see discharge summary for a list of discharge medications.  Relevant Imaging Results:  Relevant Lab Results:   Additional Information ss#[id number]. **Patient and family revoking Hospice as patient needs rehab. Hospice is aware and has discussed with family and is in agreement with [facility] rehab for patient.  **[facility] - [id number]  Cristobal Goldmannrawford, Michelle Baggerly Bradley, LCSW

## 2017-09-08 ENCOUNTER — Telehealth: Payer: Self-pay | Admitting: *Deleted

## 2017-09-08 NOTE — Telephone Encounter (Signed)
Pt was on TCM report admitted 09/04/17 for fall and right shoulder pain dx w/ closed comminuted fracture of right humerus-head and neck. Pt D/C 09/07/17 SNF w/PT -non-operatively in sling with NWB. Will start early motion, should see Dr. Eulah PontMurphy in office in about 2 weeks...Raechel Chute/lmb

## 2017-09-23 ENCOUNTER — Ambulatory Visit: Payer: Medicare Other | Admitting: Internal Medicine

## 2017-10-10 ENCOUNTER — Telehealth: Payer: Self-pay | Admitting: Internal Medicine

## 2017-10-10 NOTE — Telephone Encounter (Signed)
Spoke with Amy to let her know that PCP will be the attending physician

## 2017-10-10 NOTE — Telephone Encounter (Signed)
Copied from CRM [id number]. Topic: Quick Communication - See Telephone Encounter >> Oct 10, 2017 10:41 AM Eston Mould B wrote: Amy from [facility] is asking if Dr Jonny Ruiz  will be the  attending for the pts Hospice care.  Amy's contact- [phone]

## 2017-10-11 ENCOUNTER — Telehealth: Payer: Self-pay | Admitting: Internal Medicine

## 2017-10-11 MED ORDER — ALBUTEROL SULFATE (2.5 MG/3ML) 0.083% IN NEBU: 2.5000 mg | INHALATION_SOLUTION | RESPIRATORY_TRACT | 12 refills | Status: DC | PRN

## 2017-10-11 NOTE — Telephone Encounter (Signed)
Copied from CRM [id number]. Topic: Quick Communication - See Telephone Encounter >> Oct 11, 2017  1:15 PM Clack, Princella Pellegrini wrote: CRM for notification. See Telephone encounter for: 10/11/17.  Pt daughter Tora Duck would like to know if PCP could call in a nebulizer for the pt.  [facility] [address] ([address]), [place] - [address] [phone] (Phone) [phone] (Fax)

## 2017-10-11 NOTE — Telephone Encounter (Signed)
Done erx 

## 2017-10-12 ENCOUNTER — Telehealth: Payer: Self-pay | Admitting: Internal Medicine

## 2017-10-12 DIAGNOSIS — J449 Chronic obstructive pulmonary disease, unspecified: Secondary | ICD-10-CM

## 2017-10-12 NOTE — Telephone Encounter (Signed)
Copied from CRM [id number]. Topic: Quick Communication - See Telephone Encounter >> Oct 11, 2017  1:15 PM Clack, Princella Pellegrini wrote: CRM for notification. See Telephone encounter for: 10/11/17.  Pt daughter Tora Duck would like to know if PCP could call in a nebulizer for the pt.  [facility] [address] ([address]), [place] - [address] [phone] (Phone) [phone] (Fax)

## 2017-10-13 NOTE — Telephone Encounter (Signed)
Faxed

## 2017-10-13 NOTE — Telephone Encounter (Signed)
Patient needs the machine as well

## 2017-10-13 NOTE — Telephone Encounter (Signed)
Pts daughter calling to check on the nebulizer being called in for this pt.

## 2017-10-13 NOTE — Telephone Encounter (Signed)
Done erx 

## 2017-10-17 ENCOUNTER — Telehealth: Payer: Self-pay | Admitting: Internal Medicine

## 2017-10-17 DIAGNOSIS — I5032 Chronic diastolic (congestive) heart failure: Secondary | ICD-10-CM

## 2017-10-17 NOTE — Telephone Encounter (Signed)
Copied from CRM [id number]. Topic: Quick Communication - See Telephone Encounter >> Oct 17, 2017  1:28 PM Landry Mellow wrote: CRM for notification. See Telephone encounter for: 10/17/17. Please call daughter Angelique Blonder to let her know what has been ordered in regard to home health and pt has request for hospital bed.  Cb is [phone].  Pt is now living with daughter and has not been kept in the loop by sister.

## 2017-10-17 NOTE — Telephone Encounter (Signed)
Unfortunately I would not know what else to say, unless she has specific questions.  Pt is under hospice care after her recent hospn  I can do a hardcopy for Hosp Bed - done erx

## 2017-10-18 ENCOUNTER — Ambulatory Visit: Payer: Self-pay | Admitting: *Deleted

## 2017-10-18 NOTE — Telephone Encounter (Signed)
Ok for verbal, and we'll see her at [facility] tomorrow

## 2017-10-18 NOTE — Telephone Encounter (Signed)
[facility] notified regarding pt assessment per protocol.  Reason for Disposition . Requesting regular office appointment  Protocols used: INFORMATION ONLY CALL-A-AH

## 2017-10-18 NOTE — Telephone Encounter (Signed)
Order faxed to Hospice

## 2017-10-18 NOTE — Telephone Encounter (Signed)
Please advise 

## 2017-10-18 NOTE — Telephone Encounter (Signed)
Mo, RN from [facility] called with clinical information regarding Ms. Michelle Morton. She is following up on the patient being d/c from [place]. She was d/c from there with a nebulizer which she is using. Pt has expiratory wheezes and congestion in her chest. The nurse did not think the nebulizer was that effective against the wheezes. This nurse is asking if she needs an inhaler also. She has a dry non productive cough. She takes 160 mg of lasix daily.  Her b/p is 118/78, HR 70 R 18 and O2 Sat is 99%. She has bilateral lower extremity edema but not pitting. She has an appointment scheduled for 12/05/17 for follow up. Appointment scheduled for her for tomorrow with her pcp. Nurse is requesting a verbal order for a nurse visit for 2 weeks, once a week.

## 2017-10-18 NOTE — Telephone Encounter (Signed)
Done erx 

## 2017-10-19 ENCOUNTER — Telehealth: Payer: Self-pay

## 2017-10-19 ENCOUNTER — Encounter: Payer: Self-pay | Admitting: Internal Medicine

## 2017-10-19 ENCOUNTER — Ambulatory Visit (INDEPENDENT_AMBULATORY_CARE_PROVIDER_SITE_OTHER): Payer: Medicare Other | Admitting: Internal Medicine

## 2017-10-19 ENCOUNTER — Telehealth: Payer: Self-pay | Admitting: Internal Medicine

## 2017-10-19 VITALS — BP 114/68 | HR 76 | Temp 97.8°F | Ht 63.0 in

## 2017-10-19 DIAGNOSIS — I5032 Chronic diastolic (congestive) heart failure: Secondary | ICD-10-CM

## 2017-10-19 DIAGNOSIS — I10 Essential (primary) hypertension: Secondary | ICD-10-CM

## 2017-10-19 DIAGNOSIS — N184 Chronic kidney disease, stage 4 (severe): Secondary | ICD-10-CM | POA: Diagnosis not present

## 2017-10-19 DIAGNOSIS — S42424A Nondisplaced comminuted supracondylar fracture without intercondylar fracture of right humerus, initial encounter for closed fracture: Secondary | ICD-10-CM | POA: Diagnosis not present

## 2017-10-19 MED ORDER — FUROSEMIDE 80 MG PO TABS: ORAL_TABLET | ORAL | 3 refills | Status: DC

## 2017-10-19 MED ORDER — SITAGLIPTIN PHOSPHATE 50 MG PO TABS: 50.0000 mg | ORAL_TABLET | Freq: Every day | ORAL | 3 refills | Status: AC

## 2017-10-19 NOTE — Assessment & Plan Note (Signed)
stable overall by history and exam, recent data reviewed with pt, and pt to continue medical treatment as before,  to f/u any worsening symptoms or concerns  

## 2017-10-19 NOTE — Progress Notes (Signed)
Subjective:    Patient ID: Michelle Morton, female    DOB: [birth year], 84 y.o.   MRN: [id number]  HPI  Here after recent hospn to 4/10 for right humerus fx, DNR status., now just home May 14 from rehab, has not yet seen Dr Eulah Pont ortho.  Prior to fall could stand and mobile, but still after PT inpatient and rehab still not standing well due to LE weakness.  Did have a fall at home last mon 20 but no injury. Has seen renal with slight increased lasix to 80am/120 pm.  Hospice stopped as pt at one point she was getting palliative care with [facility].  Denies urinary symptoms such as dysuria, frequency, urgency, flank pain, hematuria or n/v, fever, chills.  Denies worsening reflux, abd pain, dysphagia, n/v, bowel change or blood. Earlier today had OT and PT authorized per renal.  Needs hosp bed.   Past Medical History:  Diagnosis Date  . Allergic rhinitis, cause unspecified 02/14/2013  . Arthritis   . Arthus phenomenon   . Diabetes mellitus, type 2 (HCC)   . Hearing loss    right ear, no hearing aid  . History of blood transfusion    [name] - unsure number of units transfused  . Hyperlipidemia   . Hypertension   . Hypothyroidism   . Kidney stones   . Osteoarthritis, knee    knees - otc med prn  . SVD (spontaneous vaginal delivery)    x 4  . Varicose veins    lower legs   Past Surgical History:  Procedure Laterality Date  . ABDOMINAL HYSTERECTOMY    . ANTERIOR AND POSTERIOR REPAIR N/A 04/17/2014   Procedure: ANTERIOR (CYSTOCELE) AND POSTERIOR REPAIR (RECTOCELE);  Surgeon: Lavina Hamman, MD;  Location: [facility];  Service: Gynecology;  Laterality: N/A;  . APPENDECTOMY    . CATARACT EXTRACTION Bilateral   . COLONOSCOPY    . KIDNEY STONE SURGERY     removal of stone  . REPLACEMENT TOTAL KNEE Bilateral   . TONSILLECTOMY    . WISDOM TOOTH EXTRACTION      reports that she quit smoking about 25 years ago. Her smoking use included cigarettes. She has a 16.00 pack-year smoking history. She has  never used smokeless tobacco. She reports that she does not drink alcohol or use drugs. family history includes Breast cancer in her daughter; Diabetes in her brother, father, mother, and sister; Hypertension in her father. Allergies  Allergen Reactions  . Penicillins Rash    Allergic to IV penicillin but tolerates the oral form Has patient had a PCN reaction causing immediate rash, facial/tongue/throat swelling, SOB or lightheadedness with hypotension: no Has patient had a PCN reaction causing severe rash involving mucus membranes or skin necrosis: no Has patient had a PCN reaction that required hospitalization already in the hospital for other procedure  Has patient had a PCN reaction occurring within the last 10 years: no If all of the above answers are "NO", then ma   Current Outpatient Medications on File Prior to Visit  Medication Sig Dispense Refill  . acetaminophen (TYLENOL) 325 MG tablet Take 2 tablets (650 mg total) by mouth daily as needed for mild pain, fever or headache.    . albuterol (PROVENTIL) (2.5 MG/3ML) 0.083% nebulizer solution Take 3 mLs (2.5 mg total) by nebulization every 4 (four) hours as needed for wheezing or shortness of breath. 75 mL 12  . cetirizine (ZYRTEC) 10 MG tablet Take 10 mg by mouth daily as needed  for allergies.     Marland Kitchen diclofenac sodium (VOLTAREN) 1 % GEL APPLY 4 GRAMS TOPICALLY FOUR TIMES DAILY as needed (Patient taking differently: Apply 4 g topically 4 (four) times daily as needed (PAIN). ) 400 g 3  . docusate sodium (COLACE) 100 MG capsule Take 1 capsule (100 mg total) by mouth 2 (two) times daily. 10 capsule 0  . gabapentin (NEURONTIN) 100 MG capsule Take 2 capsules (200 mg total) by mouth at bedtime. 60 capsule 3  . guaiFENesin (MUCINEX) 600 MG 12 hr tablet Take 1,200 mg by mouth daily.    Marland Kitchen HYDROcodone-acetaminophen (NORCO/VICODIN) 5-325 MG tablet Take 1 tablet by mouth every 6 (six) hours as needed for severe pain. 10 tablet 0  . levothyroxine  (SYNTHROID, LEVOTHROID) 88 MCG tablet Take 1 tablet (88 mcg total) by mouth daily. 90 tablet 0  . methocarbamol (ROBAXIN) 500 MG tablet Take 1 tablet (500 mg total) by mouth every 8 (eight) hours as needed for muscle spasms. 10 tablet 0  . Oxymetazoline HCl (NASAL SPRAY NA) Place 1 spray into the nose 2 (two) times daily as needed (allergies).     . pantoprazole (PROTONIX) 40 MG tablet Take 1 tablet (40 mg total) by mouth 2 (two) times daily.    Marland Kitchen senna-docusate (SENOKOT-S) 8.6-50 MG tablet Take 1 tablet by mouth at bedtime as needed for mild constipation.     No current facility-administered medications on file prior to visit.    Review of Systems  Constitutional: Negative for other unusual diaphoresis or sweats HENT: Negative for ear discharge or swelling Eyes: Negative for other worsening visual disturbances Respiratory: Negative for stridor or other swelling  Gastrointestinal: Negative for worsening distension or other blood Genitourinary: Negative for retention or other urinary change Musculoskeletal: Negative for other MSK pain or swelling Skin: Negative for color change or other new lesions Neurological: Negative for worsening tremors and other numbness  Psychiatric/Behavioral: Negative for worsening agitation or other fatigue All other system neg per pt    Objective:   Physical Exam BP 114/68   Pulse 76   Temp 97.8 F (36.6 C) (Oral)   Ht  (1.6 m)   SpO2 97%   BMI 32.22 kg/m  VS noted,  Constitutional: Pt appears in NAD HENT: Head: NCAT.  Right Ear: External ear normal.  Left Ear: External ear normal.  Eyes: . Pupils are equal, round, and reactive to light. Conjunctivae and EOM are normal Nose: without d/c or deformity Neck: Neck supple. Gross normal ROM Cardiovascular: Normal rate and regular rhythm.   Pulmonary/Chest: Effort normal and breath sounds without rales or wheezing.  Abd:  Soft, NT, ND, + BS, no organomegaly Neurological: Pt is alert. At baseline  orientation, motor grossly intact Skin: Skin is warm. No rashes, other new lesions, no LE edema Psychiatric: Pt behavior is normal without agitation  No other exam findings Lab Results  Component Value Date   WBC 8.1 09/06/2017   HGB 10.4 (L) 09/06/2017   HCT 33.2 (L) 09/06/2017   PLT 211 09/06/2017   GLUCOSE 99 09/06/2017   CHOL 175 12/17/2015   TRIG 82.0 12/17/2015   HDL 61.10 12/17/2015   LDLCALC 97 12/17/2015   ALT 17 08/26/2017   AST 23 08/26/2017   NA 139 09/06/2017   K 4.0 09/06/2017   CL 98 (L) 09/06/2017   CREATININE 2.73 (H) 09/06/2017   BUN 138 (H) 09/06/2017   CO2 26 09/06/2017   TSH 3.076 06/04/2017   INR 1.05 09/05/2017  HGBA1C 6.1 12/10/2016   MICROALBUR 4.8 (H) 10/16/2015       Assessment & Plan:

## 2017-10-19 NOTE — Telephone Encounter (Signed)
Copied from CRM [id number]. Topic: General - Other >> Oct 19, 2017  2:46 PM Elliot Gault wrote: Jethro Bolus name: Barkley Bruns  Relation to pt: [facility]  Call back number: [phone]    Reason for call: PT verbal orders 2x 4    >> Oct 19, 2017  2:47 PM Elliot Gault wrote: Jethro Bolus name: Barkley Bruns  Relation to pt: [facility]  Call back number: [phone]    Reason for call: PT verbal orders 2x 4

## 2017-10-19 NOTE — Assessment & Plan Note (Signed)
With low BP and general weakness - to d/c toprol XL 12.5,  to f/u any worsening symptoms or concerns

## 2017-10-19 NOTE — Telephone Encounter (Signed)
Verbal orders given to Mo.

## 2017-10-19 NOTE — Telephone Encounter (Signed)
Ok for verbals 

## 2017-10-19 NOTE — Assessment & Plan Note (Signed)
Cont f/u with nephrology, pt has declined HD

## 2017-10-19 NOTE — Patient Instructions (Signed)
Please continue all other medications as before, and refills have been done if requested.  Please have the pharmacy call with any other refills you may need.  Please continue your efforts at being more active, low cholesterol diet, and weight control.  You are otherwise up to date with prevention measures today.  Please keep your appointments with your specialists as you may have planned  You will be contacted regarding the referral for: orthopedic  Your Hospital Bed order will be faxed to [facility]  No further lab work needed today  Please return in 1 month, or sooner if needed

## 2017-10-19 NOTE — Telephone Encounter (Signed)
Phone call placed to patient to offer to schedule a visit with Palliative Care. VM left 

## 2017-10-19 NOTE — Assessment & Plan Note (Signed)
Due for f/u with ortho, also for hosp bed, pain control/sling, no wt bearing until ortho eval

## 2017-10-20 NOTE — Telephone Encounter (Signed)
Verbal orders given via VM 

## 2017-10-21 ENCOUNTER — Encounter (HOSPITAL_BASED_OUTPATIENT_CLINIC_OR_DEPARTMENT_OTHER): Payer: Self-pay | Admitting: Adult Health

## 2017-10-21 ENCOUNTER — Other Ambulatory Visit: Payer: Medicare Other | Admitting: Internal Medicine

## 2017-10-21 ENCOUNTER — Emergency Department (HOSPITAL_BASED_OUTPATIENT_CLINIC_OR_DEPARTMENT_OTHER)
Admission: EM | Admit: 2017-10-21 | Discharge: 2017-10-22 | Disposition: A | Discharge status: Home / Self Care | Payer: Medicare Other | Attending: Emergency Medicine | Admitting: Emergency Medicine

## 2017-10-21 ENCOUNTER — Telehealth: Payer: Self-pay | Admitting: Internal Medicine

## 2017-10-21 ENCOUNTER — Other Ambulatory Visit: Payer: Self-pay

## 2017-10-21 ENCOUNTER — Emergency Department (HOSPITAL_BASED_OUTPATIENT_CLINIC_OR_DEPARTMENT_OTHER): Payer: Medicare Other

## 2017-10-21 DIAGNOSIS — R0602 Shortness of breath: Secondary | ICD-10-CM | POA: Diagnosis not present

## 2017-10-21 DIAGNOSIS — E039 Hypothyroidism, unspecified: Secondary | ICD-10-CM | POA: Diagnosis not present

## 2017-10-21 DIAGNOSIS — R531 Weakness: Secondary | ICD-10-CM

## 2017-10-21 DIAGNOSIS — Z7189 Other specified counseling: Secondary | ICD-10-CM

## 2017-10-21 DIAGNOSIS — I5032 Chronic diastolic (congestive) heart failure: Secondary | ICD-10-CM | POA: Diagnosis not present

## 2017-10-21 DIAGNOSIS — Z79899 Other long term (current) drug therapy: Secondary | ICD-10-CM | POA: Insufficient documentation

## 2017-10-21 DIAGNOSIS — R509 Fever, unspecified: Secondary | ICD-10-CM | POA: Insufficient documentation

## 2017-10-21 DIAGNOSIS — R410 Disorientation, unspecified: Secondary | ICD-10-CM | POA: Insufficient documentation

## 2017-10-21 DIAGNOSIS — R112 Nausea with vomiting, unspecified: Secondary | ICD-10-CM | POA: Diagnosis not present

## 2017-10-21 DIAGNOSIS — I130 Hypertensive heart and chronic kidney disease with heart failure and stage 1 through stage 4 chronic kidney disease, or unspecified chronic kidney disease: Secondary | ICD-10-CM | POA: Insufficient documentation

## 2017-10-21 DIAGNOSIS — N184 Chronic kidney disease, stage 4 (severe): Secondary | ICD-10-CM | POA: Insufficient documentation

## 2017-10-21 DIAGNOSIS — E1122 Type 2 diabetes mellitus with diabetic chronic kidney disease: Secondary | ICD-10-CM | POA: Insufficient documentation

## 2017-10-21 DIAGNOSIS — Z96653 Presence of artificial knee joint, bilateral: Secondary | ICD-10-CM | POA: Diagnosis not present

## 2017-10-21 DIAGNOSIS — Z87891 Personal history of nicotine dependence: Secondary | ICD-10-CM | POA: Diagnosis not present

## 2017-10-21 DIAGNOSIS — R109 Unspecified abdominal pain: Secondary | ICD-10-CM | POA: Insufficient documentation

## 2017-10-21 LAB — COMPREHENSIVE METABOLIC PANEL
ALBUMIN: 3.4 g/dL — AB (ref 3.5–5.0)
ALK PHOS: 64 U/L (ref 38–126)
ALT: 15 U/L (ref 14–54)
AST: 17 U/L (ref 15–41)
Anion gap: 12 (ref 5–15)
BUN: 120 mg/dL — AB (ref 6–20)
CO2: 23 mmol/L (ref 22–32)
Calcium: 7.1 mg/dL — ABNORMAL LOW (ref 8.9–10.3)
Chloride: 100 mmol/L — ABNORMAL LOW (ref 101–111)
Creatinine, Ser: 2.64 mg/dL — ABNORMAL HIGH (ref 0.44–1.00)
GFR calc Af Amer: 18 mL/min — ABNORMAL LOW (ref >60)
GFR calc non Af Amer: 16 mL/min — ABNORMAL LOW (ref >60)
GLUCOSE: 80 mg/dL (ref 65–99)
POTASSIUM: 4.1 mmol/L (ref 3.5–5.1)
SODIUM: 135 mmol/L (ref 135–145)
TOTAL PROTEIN: 7.2 g/dL (ref 6.5–8.1)
Total Bilirubin: 0.6 mg/dL (ref 0.3–1.2)

## 2017-10-21 LAB — BRAIN NATRIURETIC PEPTIDE: B Natriuretic Peptide: 128 pg/mL — ABNORMAL HIGH (ref 0.0–100.0)

## 2017-10-21 MED ORDER — FUROSEMIDE 40 MG PO TABS
80.0000 mg | ORAL_TABLET | Freq: Once | ORAL | Status: AC
  Administered 2017-10-21: 80 mg via ORAL
  Filled 2017-10-21: qty 2

## 2017-10-21 NOTE — ED Triage Notes (Signed)
Pt lives with daughter and is the care of palliative care. The NP came out at 11 AM and took her VS. Per the NP pt had wheezing, SOB and low BP. She has gained 11 pounds since her d/c on May 14. They were concerned and wanted her to be evaluated for CHF exacerbation.  Pt is speaking in full sentences. They would also like her tested for C-diff because she has had diarrhea for the past few days. She has a right humeral head fracture from three weeks ago.

## 2017-10-21 NOTE — Telephone Encounter (Signed)
Spoke to [facility] - Per PCP - Pt should go to ED. Talbert Forest stated understanding and would advise patient.

## 2017-10-21 NOTE — Telephone Encounter (Signed)
Talbert Forest from Palliative care has called.   States that patient has had explosive diarrhea for several days.  States is golden brown and has a fowl smell.  Believes patient may need to be checked for C Diff.   Patient has edema up to her mid lower leg. States can't get weight b/c patient can't stand up due to weakness.   States patient has audible wheezing that can be heard from across the room and can listen to in her bronchial area.   BP 90/52 HR 57 O2 98% States that patient does want to try therapy.  States has order for palliative care.  Would like to know how to address these issues and would like a call back as soon as possible today.

## 2017-10-21 NOTE — Discharge Instructions (Addendum)
As discussed, your evaluation today has been largely reassuring.  But, it is important that you monitor your condition carefully, and do not hesitate to return to the ED if you develop new, or concerning changes in your condition. ? ?Otherwise, please follow-up with your physician for appropriate ongoing care. ? ?

## 2017-10-21 NOTE — ED Provider Notes (Signed)
[facility] EMERGENCY DEPARTMENT Provider Note   CSN: [id number] Arrival date & time: 10/21/17  1804     History   Chief Complaint Chief Complaint  Patient presents with  . Shortness of Breath    HPI Michelle Morton is a 84 y.o. female.  HPI Patient presents with concern of dyspnea, fatigue. Patient has multiple medical issues, including recent fall, with fracture of her patient is coming from home, though she was at rehabilitation until a few days ago.  She was at rehabilitation following a fall with fracture of her right proximal arm. Patient has confusion, dissertation, fever, abdominal pain, nausea, vomiting. Patient has had some episodes of loose stools since returning home, without clear alleviating or exacerbating factors in addition to the after mentioned fatigue and dyspnea. No recent medication change, diet change.  Past Medical History:  Diagnosis Date  . Allergic rhinitis, cause unspecified 02/14/2013  . Arthritis   . Arthus phenomenon   . Diabetes mellitus, type 2 (HCC)   . Hearing loss    right ear, no hearing aid  . History of blood transfusion    [name] - unsure number of units transfused  . Hyperlipidemia   . Hypertension   . Hypothyroidism   . Kidney stones   . Osteoarthritis, knee    knees - otc med prn  . SVD (spontaneous vaginal delivery)    x 4  . Varicose veins    lower legs    Patient Active Problem List   Diagnosis Date Noted  . Fall 09/05/2017  . Closed comminuted fracture of right humerus-head and neck 09/05/2017  . Hospice care patient 09/05/2017  . Fracture 09/05/2017  . Eustachian tube dysfunction, left 08/22/2017  . Palliative care encounter   . Chronic diastolic CHF (congestive heart failure) (HCC) 06/04/2017  . DM (diabetes mellitus), type 2 with renal complications (HCC) 06/04/2017  . Weight gain 06/03/2017  . Acute diastolic CHF (congestive heart failure) (HCC) 05/07/2017  . Bilateral lower extremity edema  05/06/2017  . Osteoporosis 01/26/2017  . Degenerative disc disease, lumbar 12/29/2016  . Neck pain on left side 12/12/2016  . Leg cramps 06/05/2016  . General weakness 03/04/2016  . Hypoglycemia 12/17/2015  . RLS (restless legs syndrome) 12/17/2015  . Weight loss 12/17/2015  . Back pain 10/16/2015  . Right-sided chest pain 03/27/2015  . Diarrhea 03/27/2015  . Hoarseness 03/27/2015  . Loss of weight 03/27/2015  . Depression 01/07/2015  . Chronic kidney disease (CKD), stage IV (severe) (HCC) 01/07/2015  . AKI (acute kidney injury) (HCC) 10/09/2014  . Uterine prolaps 09/01/2013  . Peripheral edema 02/14/2013  . Allergic rhinitis 02/14/2013  . Obesity, Class II, BMI 35-39.9 05/12/2011  . Preventative health care 05/12/2011  . TINNITUS 01/09/2009  . Essential hypertension 05/05/2007  . Hypothyroidism 02/28/2007  . Diabetes (HCC) 02/28/2007  . Hyperlipidemia 02/28/2007  . Osteoarthrosis, unspecified whether generalized or localized, involving lower leg 02/28/2007    Past Surgical History:  Procedure Laterality Date  . ABDOMINAL HYSTERECTOMY    . ANTERIOR AND POSTERIOR REPAIR N/A 04/17/2014   Procedure: ANTERIOR (CYSTOCELE) AND POSTERIOR REPAIR (RECTOCELE);  Surgeon: Lavina Hamman, MD;  Location: [facility];  Service: Gynecology;  Laterality: N/A;  . APPENDECTOMY    . CATARACT EXTRACTION Bilateral   . COLONOSCOPY    . KIDNEY STONE SURGERY     removal of stone  . REPLACEMENT TOTAL KNEE Bilateral   . TONSILLECTOMY    . WISDOM TOOTH EXTRACTION  OB History   None      Home Medications    Prior to Admission medications   Medication Sig Start Date End Date Taking? Authorizing Provider  acetaminophen (TYLENOL) 325 MG tablet Take 2 tablets (650 mg total) by mouth daily as needed for mild pain, fever or headache. 09/07/17   Marlin Canary U, DO  albuterol (PROVENTIL) (2.5 MG/3ML) 0.083% nebulizer solution Take 3 mLs (2.5 mg total) by nebulization every 4 (four) hours as needed  for wheezing or shortness of breath. 10/11/17   [name], MD  cetirizine (ZYRTEC) 10 MG tablet Take 10 mg by mouth daily as needed for allergies.     [provider]  diclofenac sodium (VOLTAREN) 1 % GEL APPLY 4 GRAMS TOPICALLY FOUR TIMES DAILY as needed Patient taking differently: Apply 4 g topically 4 (four) times daily as needed (PAIN).  06/04/16   [name], MD  docusate sodium (COLACE) 100 MG capsule Take 1 capsule (100 mg total) by mouth 2 (two) times daily. 09/07/17   Joseph Art, DO  furosemide (LASIX) 80 MG tablet 1 tab by mouth in the AM and 1 1/2 tab in the PM per renal 10/19/17   [name], MD  gabapentin (NEURONTIN) 100 MG capsule Take 2 capsules (200 mg total) by mouth at bedtime. 01/26/17   Judi Saa, DO  guaiFENesin (MUCINEX) 600 MG 12 hr tablet Take 1,200 mg by mouth daily.    [provider]  HYDROcodone-acetaminophen (NORCO/VICODIN) 5-325 MG tablet Take 1 tablet by mouth every 6 (six) hours as needed for severe pain. 09/07/17   Joseph Art, DO  levothyroxine (SYNTHROID, LEVOTHROID) 88 MCG tablet Take 1 tablet (88 mcg total) by mouth daily. 08/22/17   [name], MD  methocarbamol (ROBAXIN) 500 MG tablet Take 1 tablet (500 mg total) by mouth every 8 (eight) hours as needed for muscle spasms. 09/07/17   Joseph Art, DO  Oxymetazoline HCl (NASAL SPRAY NA) Place 1 spray into the nose 2 (two) times daily as needed (allergies).     [provider]  pantoprazole (PROTONIX) 40 MG tablet Take 1 tablet (40 mg total) by mouth 2 (two) times daily. 09/07/17   Joseph Art, DO  senna-docusate (SENOKOT-S) 8.6-50 MG tablet Take 1 tablet by mouth at bedtime as needed for mild constipation. 09/07/17   Joseph Art, DO  sitaGLIPtin (JANUVIA) 50 MG tablet Take 1 tablet (50 mg total) by mouth daily. 10/19/17 10/19/18  [name], MD    Family History Family History  Problem Relation Age of Onset  . Diabetes Mother   . Diabetes Father   .  Hypertension Father   . Breast cancer Daughter   . Diabetes Brother        x 1  . Diabetes Sister        x 4  . Colon cancer Neg Hx   . Esophageal cancer Neg Hx   . Pancreatic cancer Neg Hx   . Liver disease Neg Hx   . Kidney disease Neg Hx     Social History Social History   Tobacco Use  . Smoking status: Former Smoker    Packs/day: 1.00    Years: 16.00    Pack years: 16.00    Types: Cigarettes    Last attempt to quit: 05/10/1992    Years since quitting: 25.4  . Smokeless tobacco: Never Used  Substance Use Topics  . Alcohol use: No  Alcohol/week: 0.0 oz  . Drug use: No     Allergies   Penicillins   Review of Systems Review of Systems  Constitutional:       Per HPI, otherwise negative  HENT:       Per HPI, otherwise negative  Respiratory:       Per HPI, otherwise negative  Cardiovascular:       Per HPI, otherwise negative  Gastrointestinal: Negative for vomiting.  Endocrine:       Negative aside from HPI  Genitourinary:       Neg aside from HPI   Musculoskeletal:       Per HPI, otherwise negative  Skin: Negative.   Neurological: Positive for weakness. Negative for syncope.     Physical Exam Updated Vital Signs BP (!) 114/48   Pulse 87   Resp (!) 21   Ht  (1.6 m)   Wt 87.5 kg (193 lb)   SpO2 100%   BMI 34.19 kg/m   Physical Exam  Constitutional: She is oriented to person, place, and time. She appears well-developed and well-nourished. No distress.  Obese, elderly F  HENT:  Head: Normocephalic and atraumatic.  Eyes: Conjunctivae and EOM are normal.  Cardiovascular: Normal rate and regular rhythm.  Pulmonary/Chest: She has decreased breath sounds.  Abdominal: She exhibits no distension.  Musculoskeletal: She exhibits no edema.       Arms: Neurological: She is alert and oriented to person, place, and time. No cranial nerve deficit.  Skin: Skin is warm and dry.  Psychiatric: She has a normal mood and affect.  Nursing note and vitals  reviewed.    ED Treatments / Results  Labs (all labs ordered are listed, but only abnormal results are displayed) Labs Reviewed  COMPREHENSIVE METABOLIC PANEL - Abnormal; Notable for the following components:      Result Value   Chloride 100 (*)    BUN 120 (*)    Creatinine, Ser 2.64 (*)    Calcium 7.1 (*)    Albumin 3.4 (*)    GFR calc non Af Amer 16 (*)    GFR calc Af Amer 18 (*)    All other components within normal limits  BRAIN NATRIURETIC PEPTIDE - Abnormal; Notable for the following components:   B Natriuretic Peptide 128.0 (*)    All other components within normal limits  C DIFFICILE QUICK SCREEN W PCR REFLEX  URINALYSIS, ROUTINE W REFLEX MICROSCOPIC    EKG EKG Interpretation  Date/Time:  Friday Oct 21 2017 18:28:44 EDT Ventricular Rate:  74 PR Interval:    QRS Duration: 85 QT Interval:  404 QTC Calculation: 449 R Axis:   22 Text Interpretation:  Sinus rhythm Atrial premature complexes Low voltage, precordial leads Artifact Abnormal ekg Confirmed by Gerhard Munch [phone]) on 10/21/2017 6:49:10 PM   Radiology Dg Chest 2 View  Result Date: 10/21/2017 CLINICAL DATA:  84 y/o  F; shortness of breath and wheezing. EXAM: CHEST - 2 VIEW COMPARISON:  08/26/2013 chest radiograph. 09/05/2017 right shoulder radiograph FINDINGS: Stable mild cardiomegaly given projection and technique. Aortic atherosclerosis with calcification. Interstitial pulmonary edema. No focal consolidation. No pleural effusion or pneumothorax. Proximal humerus 1 shaft's width displaced fracture again seen IMPRESSION: Mild cardiomegaly. Interstitial pulmonary edema. Aortic atherosclerosis. Electronically Signed   By: Mitzi Hansen M.D.   On: 10/21/2017 20:44    Procedures Procedures (including critical care time)  Medications Ordered in ED Medications  furosemide (LASIX) tablet 80 mg (has no administration in time range)  Initial Impression / Assessment and Plan / ED Course  I  have reviewed the triage vital signs and the nursing notes.  Pertinent labs & imaging results that were available during my care of the patient were reviewed by me and considered in my medical decision making (see chart for details).     11:15 PM Daughter. We discussed all findings including decreasing BNP, no pneumonia or pulmonary congestion on x-ray, no notable physical exam findings beyond patient's apparent baseline, and she has had no bowel movements here, and with no abdominal pain, no fever, no leukocytosis, low suspicion for C. difficile infection. No evidence for pneumonia, no evidence for worsening heart failure, ischemic changes. Patient discharged in stable condition with outpatient follow-up.  Final Clinical Impressions(s) / ED Diagnoses   Final diagnoses:  SOB (shortness of breath)     Gerhard Munch, MD 10/21/17 2316

## 2017-10-27 ENCOUNTER — Other Ambulatory Visit: Payer: Medicare Other | Admitting: Internal Medicine

## 2017-10-27 DIAGNOSIS — R531 Weakness: Secondary | ICD-10-CM

## 2017-10-27 DIAGNOSIS — Z7189 Other specified counseling: Secondary | ICD-10-CM

## 2017-10-27 DIAGNOSIS — R0602 Shortness of breath: Secondary | ICD-10-CM

## 2017-10-27 NOTE — Progress Notes (Signed)
PALLIATIVE CARE CONSULT VISIT   PATIENT NAME: Michelle Morton DOB: February 14, 1932 MRN: [id number]  PRIMARY CARE PROVIDER:   [name], MD  REFERRING PROVIDER:      [name], MD [address] [place], [place] [address]  RESPONSIBLE PARTY:  self        RECOMMENDATIONS and PLAN:  1.Shortness of breath R06.02:  Acute reoccurence with associated wheezing and 13# weight gain even with use of diuretic.  No nebulizer at home.  Not following a low sodium meal plan.  Sent to ER for evaluation per request of PCP.  Pt will need additional monitoring post hospital visit.  2. Weakness R53.1-  Deconditioned with associated CHF and CKD. She will require significant assistance .  Home physical /occupational therapy for strengthening, transfers and gait training.   Evaluate electrolytes and CBC related to several week occurrence of diarrhea.  Heart healthy meals.  3. Advanced care planning-DNR.  Previous Hospice patient who now desires therapies to improve functional decline.  Reviewed Palliative and Hospice services.  Pt. States that she will go to hospital for evaluation but she does not want to be hospitalized or be admitted to a "nursing home".   MOST form completed with selections of DNAR/DNI, comfort measures, No IVF, antibiotics or tube feeding. Palliative care will continue to follow.  Pt may resume Hospice services after completion of therapy.   I spent 120 minutes providing this consultation,  from 10:30am to 12:30pm at home. More than 50% of the time in this consultation was spent coordinating communication with patient, daughter Tora Duck and nurse Tammy at Dr. Fayrene Fearing [name]'s office.   HISTORY OF PRESENT ILLNESS:  Michelle Morton is a 84 y.o. year old female with multiple medical problems including CHF, CKD stage 4(GFR=18, BUN 120 CREA 2.64), Type 2 diabetes.  She was hospitalized from 4/7-4/10/19 after falling at home and sustaining a R humerus head fracture.  She was admitted to sn[facility] for rehab  from 4/10-5/14/19.  No surgical repair.  Patient and daughter report a great deal of functional decline since hospitalization.  Pt previously was ambulatory and required some assistance with ADLs.  She has great difficulty with ambulation and is total care.  Palliative Care was asked to help address goals of care.   CODE STATUS: DNR/DNI  PPS: 40%   Mobile via WC HOSPICE ELIGIBILITY/DIAGNOSIS: TBD.  Receiving home therapies  PAST MEDICAL HISTORY:  Past Medical History:  Diagnosis Date  . Allergic rhinitis, cause unspecified 02/14/2013  . Arthritis   . Arthus phenomenon   . Diabetes mellitus, type 2 (HCC)   . Hearing loss    right ear, no hearing aid  . History of blood transfusion    [name] - unsure number of units transfused  . Hyperlipidemia   . Hypertension   . Hypothyroidism   . Kidney stones   . Osteoarthritis, knee    knees - otc med prn  . SVD (spontaneous vaginal delivery)    x 4  . Varicose veins    lower legs    SOCIAL HX:  Social History   Tobacco Use  . Smoking status: Former Smoker    Packs/day: 1.00    Years: 16.00    Pack years: 16.00    Types: Cigarettes    Last attempt to quit: 05/10/1992    Years since quitting: 25.4  . Smokeless tobacco: Never Used  Substance Use Topics  . Alcohol use: No    Alcohol/week:  0.0 oz    ALLERGIES:  Allergies  Allergen Reactions  . Penicillins Rash    Allergic to IV penicillin but tolerates the oral form Has patient had a PCN reaction causing immediate rash, facial/tongue/throat swelling, SOB or lightheadedness with hypotension: no Has patient had a PCN reaction causing severe rash involving mucus membranes or skin necrosis: no Has patient had a PCN reaction that required hospitalization already in the hospital for other procedure  Has patient had a PCN reaction occurring within the last 10 years: no If all of the above answers are "NO", then ma     PERTINENT MEDICATIONS:  Outpatient Encounter Medications as  of 10/21/2017  Medication Sig  . acetaminophen (TYLENOL) 325 MG tablet Take 2 tablets (650 mg total) by mouth daily as needed for mild pain, fever or headache.  . albuterol (PROVENTIL) (2.5 MG/3ML) 0.083% nebulizer solution Take 3 mLs (2.5 mg total) by nebulization every 4 (four) hours as needed for wheezing or shortness of breath.  . cetirizine (ZYRTEC) 10 MG tablet Take 10 mg by mouth daily as needed for allergies.   Marland Kitchen diclofenac sodium (VOLTAREN) 1 % GEL APPLY 4 GRAMS TOPICALLY FOUR TIMES DAILY as needed (Patient taking differently: Apply 4 g topically 4 (four) times daily as needed (PAIN). )  . docusate sodium (COLACE) 100 MG capsule Take 1 capsule (100 mg total) by mouth 2 (two) times daily.  . furosemide (LASIX) 80 MG tablet 1 tab by mouth in the AM and 1 1/2 tab in the PM per renal  . gabapentin (NEURONTIN) 100 MG capsule Take 2 capsules (200 mg total) by mouth at bedtime.  Marland Kitchen guaiFENesin (MUCINEX) 600 MG 12 hr tablet Take 1,200 mg by mouth daily.  Marland Kitchen HYDROcodone-acetaminophen (NORCO/VICODIN) 5-325 MG tablet Take 1 tablet by mouth every 6 (six) hours as needed for severe pain.  Marland Kitchen levothyroxine (SYNTHROID, LEVOTHROID) 88 MCG tablet Take 1 tablet (88 mcg total) by mouth daily.  . methocarbamol (ROBAXIN) 500 MG tablet Take 1 tablet (500 mg total) by mouth every 8 (eight) hours as needed for muscle spasms.  . Oxymetazoline HCl (NASAL SPRAY NA) Place 1 spray into the nose 2 (two) times daily as needed (allergies).   . pantoprazole (PROTONIX) 40 MG tablet Take 1 tablet (40 mg total) by mouth 2 (two) times daily.  Marland Kitchen senna-docusate (SENOKOT-S) 8.6-50 MG tablet Take 1 tablet by mouth at bedtime as needed for mild constipation.  . sitaGLIPtin (JANUVIA) 50 MG tablet Take 1 tablet (50 mg total) by mouth daily.   No facility-administered encounter medications on file as of 10/21/2017.     PHYSICAL EXAM:   General:  Well nourished elderly female sitting in recline Cardiovascular: regular rate and  rhythm Pulmonary: audible wheezing across the room.  Mildly short of breath Abdomen: soft, nontender, + bowel sounds GU: no suprapubic tenderness Extremities: No ROM of RUE which is in a sling.  2+ edema of mid calf to feet.  Requirs 2 persons to attempt to stand Skin: Exposed skin is intact Neurological: Alert and oriented x 3. Speaks in sentences. Weakness   [name], NP

## 2017-10-31 ENCOUNTER — Telehealth: Payer: Self-pay | Admitting: Internal Medicine

## 2017-10-31 NOTE — Telephone Encounter (Signed)
Notified Moe w/MD response...Raechel Chute/lmb

## 2017-10-31 NOTE — Telephone Encounter (Signed)
Ok for verbals 

## 2017-10-31 NOTE — Telephone Encounter (Signed)
Elliot GaultBell, Tiffany M 10/31/2017 08:56 AM  Summary: [facility] - Verbal Orders / RN     Caller name: Drue StagerMoe  Relation to pt: RN from [facility]  Call back number: [phone]    Reason for call:  Verbal orders requesting skilled nursing 1x 3  Reassessment, please advise

## 2017-11-01 ENCOUNTER — Encounter: Payer: Self-pay | Admitting: Internal Medicine

## 2017-11-01 ENCOUNTER — Ambulatory Visit (INDEPENDENT_AMBULATORY_CARE_PROVIDER_SITE_OTHER): Payer: Medicare Other | Admitting: Internal Medicine

## 2017-11-01 VITALS — BP 112/72 | HR 77 | Temp 97.0°F | Ht 63.0 in | Wt 193.0 lb

## 2017-11-01 DIAGNOSIS — E1129 Type 2 diabetes mellitus with other diabetic kidney complication: Secondary | ICD-10-CM

## 2017-11-01 DIAGNOSIS — I5032 Chronic diastolic (congestive) heart failure: Secondary | ICD-10-CM | POA: Diagnosis not present

## 2017-11-01 DIAGNOSIS — I10 Essential (primary) hypertension: Secondary | ICD-10-CM

## 2017-11-01 NOTE — Assessment & Plan Note (Signed)
stable overall by history and exam, recent data reviewed with pt, and pt to continue medical treatment as before,  to f/u any worsening symptoms or concerns Lab Results  Component Value Date   HGBA1C 6.1 12/10/2016

## 2017-11-01 NOTE — Assessment & Plan Note (Signed)
Wt and exam stable since may 24, pt reassured and has no evidence now for need for admission for diuresis that she is seeking, cont same tx

## 2017-11-01 NOTE — Patient Instructions (Signed)
Please continue all other medications as before, and refills have been done if requested.  Please have the pharmacy call with any other refills you may need.  Please keep your appointments with your specialists as you may have planned  Please call if you decide that you will need hospice to start  OK for your daughter to drop off the FMLA to assist with your health needs such as working half days for 2 months  The form will be filled out to try to get more assistance in the home

## 2017-11-01 NOTE — Progress Notes (Signed)
Subjective:    Patient ID: Michelle Morton, female    DOB: 18-May-1932, 84 y.o.   MRN: [id number]  HPI   Here after recently seen in ED may 24 with dyspnea, fatigue, after completing rehab stay after fall and right arm fx, and being at home for a few days; exam and cxr c/w mild volume increase, tx with lasix 80 po x 1. Pt denies chest pain, increased sob at rest, wheezing, orthopnea, PND, increased LE swelling, palpitations, dizziness or syncope, but states she has worsening doe room to room at home.  C/o retained fluid to the upper legs and wts overall increased but stable since ED visit.  Wt Readings from Last 3 Encounters:  11/01/17 193 lb (87.5 kg)  10/21/17 193 lb (87.5 kg)  09/06/17 181 lb 14.1 oz (82.5 kg)  Daughter and pt agree that she has become near total care, they want to finish her right shoulder PT but are ready for hospice after that.  Also daughter asking for FMLA filled out so that she can be home half days for 2 months. Also asking for a form filled out to gain other assistance in the home.   Pt denies fever, wt loss, night sweats, loss of appetite, or other constitutional symptoms  Now with occasional bowel and bladder incontinence, has not yet tried immodium.  Denies worsening reflux, abd pain, dysphagia, n/v, or blood. Past Medical History:  Diagnosis Date  . Allergic rhinitis, cause unspecified 02/14/2013  . Arthritis   . Arthus phenomenon   . Diabetes mellitus, type 2 (HCC)   . Hearing loss    right ear, no hearing aid  . History of blood transfusion    [facility] - unsure number of units transfused  . Hyperlipidemia   . Hypertension   . Hypothyroidism   . Kidney stones   . Osteoarthritis, knee    knees - otc med prn  . SVD (spontaneous vaginal delivery)    x 4  . Varicose veins    lower legs   Past Surgical History:  Procedure Laterality Date  . ABDOMINAL HYSTERECTOMY    . ANTERIOR AND POSTERIOR REPAIR N/A 04/17/2014   Procedure: ANTERIOR (CYSTOCELE) AND  POSTERIOR REPAIR (RECTOCELE);  Surgeon: Lavina Hammanodd Meisinger, MD;  Location: [facility];  Service: Gynecology;  Laterality: N/A;  . APPENDECTOMY    . CATARACT EXTRACTION Bilateral   . COLONOSCOPY    . KIDNEY STONE SURGERY     removal of stone  . REPLACEMENT TOTAL KNEE Bilateral   . TONSILLECTOMY    . WISDOM TOOTH EXTRACTION      reports that she quit smoking about 25 years ago. Her smoking use included cigarettes. She has a 16.00 pack-year smoking history. She has never used smokeless tobacco. She reports that she does not drink alcohol or use drugs. family history includes Breast cancer in her daughter; Diabetes in her brother, father, mother, and sister; Hypertension in her father. Allergies  Allergen Reactions  . Penicillins Rash    Allergic to IV penicillin but tolerates the oral form Has patient had a PCN reaction causing immediate rash, facial/tongue/throat swelling, SOB or lightheadedness with hypotension: no Has patient had a PCN reaction causing severe rash involving mucus membranes or skin necrosis: no Has patient had a PCN reaction that required hospitalization already in the hospital for other procedure  Has patient had a PCN reaction occurring within the last 10 years: no If all of the above answers are "NO", then ma  Current Outpatient Medications on File Prior to Visit  Medication Sig Dispense Refill  . acetaminophen (TYLENOL) 325 MG tablet Take 2 tablets (650 mg total) by mouth daily as needed for mild pain, fever or headache.    . albuterol (PROVENTIL) (2.5 MG/3ML) 0.083% nebulizer solution Take 3 mLs (2.5 mg total) by nebulization every 4 (four) hours as needed for wheezing or shortness of breath. 75 mL 12  . cetirizine (ZYRTEC) 10 MG tablet Take 10 mg by mouth daily as needed for allergies.     Marland Kitchen diclofenac sodium (VOLTAREN) 1 % GEL APPLY 4 GRAMS TOPICALLY FOUR TIMES DAILY as needed (Patient taking differently: Apply 4 g topically 4 (four) times daily as needed (PAIN). ) 400 g 3    . docusate sodium (COLACE) 100 MG capsule Take 1 capsule (100 mg total) by mouth 2 (two) times daily. 10 capsule 0  . furosemide (LASIX) 80 MG tablet 1 tab by mouth in the AM and 1 1/2 tab in the PM per renal 90 tablet 3  . gabapentin (NEURONTIN) 100 MG capsule Take 2 capsules (200 mg total) by mouth at bedtime. 60 capsule 3  . guaiFENesin (MUCINEX) 600 MG 12 hr tablet Take 1,200 mg by mouth daily.    Marland Kitchen HYDROcodone-acetaminophen (NORCO/VICODIN) 5-325 MG tablet Take 1 tablet by mouth every 6 (six) hours as needed for severe pain. 10 tablet 0  . levothyroxine (SYNTHROID, LEVOTHROID) 88 MCG tablet Take 1 tablet (88 mcg total) by mouth daily. 90 tablet 0  . methocarbamol (ROBAXIN) 500 MG tablet Take 1 tablet (500 mg total) by mouth every 8 (eight) hours as needed for muscle spasms. 10 tablet 0  . Oxymetazoline HCl (NASAL SPRAY NA) Place 1 spray into the nose 2 (two) times daily as needed (allergies).     . pantoprazole (PROTONIX) 40 MG tablet Take 1 tablet (40 mg total) by mouth 2 (two) times daily.    Marland Kitchen senna-docusate (SENOKOT-S) 8.6-50 MG tablet Take 1 tablet by mouth at bedtime as needed for mild constipation.    . sitaGLIPtin (JANUVIA) 50 MG tablet Take 1 tablet (50 mg total) by mouth daily. 90 tablet 3   No current facility-administered medications on file prior to visit.    Review of Systems  Constitutional: Negative for other unusual diaphoresis or sweats HENT: Negative for ear discharge or swelling Eyes: Negative for other worsening visual disturbances Respiratory: Negative for stridor or other swelling  Gastrointestinal: Negative for worsening distension or other blood Genitourinary: Negative for retention or other urinary change Musculoskeletal: Negative for other MSK pain or swelling Skin: Negative for color change or other new lesions Neurological: Negative for worsening tremors and other numbness  Psychiatric/Behavioral: Negative for worsening agitation or other fatigue All other  system neg pre pt    Objective:   Physical Exam BP 112/72   Pulse 77   Temp (!) 97 F (36.1 C) (Oral)   Ht 5\' 3"  (1.6 m)   Wt 193 lb (87.5 kg)   SpO2 97%   BMI 34.19 kg/m  VS noted, not ill appearing but more slumped in wheelchair and not able to stand Constitutional: Pt appears in NAD HENT: Head: NCAT.  Right Ear: External ear normal.  Left Ear: External ear normal.  Eyes: . Pupils are equal, round, and reactive to light. Conjunctivae and EOM are normal Nose: without d/c or deformity Neck: Neck supple. Gross normal ROM Cardiovascular: Normal rate and regular rhythm.   Pulmonary/Chest: Effort normal and breath sounds without rales or  wheezing.  Abd:  Soft, NT, ND, + BS, no organomegaly Neurological: Pt is alert. At baseline orientation, motor grossly intact Skin: Skin is warm. No rashes, other new lesions, trace bilat LE edema Psychiatric: Pt behavior is normal without agitation  No other exam findings  Lab Results  Component Value Date   WBC 8.1 09/06/2017   HGB 10.4 (L) 09/06/2017   HCT 33.2 (L) 09/06/2017   PLT 211 09/06/2017   GLUCOSE 80 10/21/2017   CHOL 175 12/17/2015   TRIG 82.0 12/17/2015   HDL 61.10 12/17/2015   LDLCALC 97 12/17/2015   ALT 15 10/21/2017   AST 17 10/21/2017   NA 135 10/21/2017   K 4.1 10/21/2017   CL 100 (L) 10/21/2017   CREATININE 2.64 (H) 10/21/2017   BUN 120 (H) 10/21/2017   CO2 23 10/21/2017   TSH 3.076 06/04/2017   INR 1.05 09/05/2017   HGBA1C 6.1 12/10/2016   MICROALBUR 4.8 (H) 10/16/2015       Assessment & Plan:

## 2017-11-01 NOTE — Assessment & Plan Note (Signed)
stable overall by history and exam, recent data reviewed with pt, and pt to continue medical treatment as before,  to f/u any worsening symptoms or concerns BP Readings from Last 3 Encounters:  11/01/17 112/72  10/21/17 (!) 114/48  10/19/17 114/68

## 2017-11-02 ENCOUNTER — Telehealth: Payer: Self-pay | Admitting: Internal Medicine

## 2017-11-02 NOTE — Telephone Encounter (Signed)
Ok for verbal 

## 2017-11-02 NOTE — Telephone Encounter (Signed)
Copied from CRM [id number]. Topic: Quick Communication - See Telephone Encounter >> Nov 02, 2017  2:47 PM Arlyss Gandyichardson, Mylynn Dinh N, NT wrote: CRM for notification. See Telephone encounter for: 11/02/17. Sheria LangCameron with [facility] calling to get verbal orders for a home health aide twice a week to help with bathing and dressing. CB#: [phone]. May leave voicemail.

## 2017-11-03 ENCOUNTER — Telehealth: Payer: Self-pay

## 2017-11-03 ENCOUNTER — Telehealth: Payer: Self-pay | Admitting: Internal Medicine

## 2017-11-03 DIAGNOSIS — R0602 Shortness of breath: Secondary | ICD-10-CM | POA: Insufficient documentation

## 2017-11-03 MED ORDER — TRAMADOL HCL 50 MG PO TABS: 50.0000 mg | ORAL_TABLET | Freq: Four times a day (QID) | ORAL | 0 refills | Status: DC | PRN

## 2017-11-03 NOTE — Telephone Encounter (Signed)
Patient's daughter Mayra Reel(Sheryl Cantrelle) dropped off FMLA forms to be completed for her to care for her mother.  Sheryl can be reached at [phone] if there are any questions. She would like this form faxed when completed [phone]). Placed in Brittany's box for completion.

## 2017-11-03 NOTE — Telephone Encounter (Signed)
Verbal orders given  

## 2017-11-03 NOTE — Telephone Encounter (Signed)
Patient daughter is requesting to "Only work 4 hours a day someday." Please advise what you approve. Thank you.

## 2017-11-03 NOTE — Telephone Encounter (Signed)
Copied from CRM [phone]. Topic: General - Other >> Nov 03, 2017  4:18 PM Mcneil, Ja-Kwan wrote: Reason for CRM: Pt states she was told she could begin to put weight on her shoulder and during physical therapy she began new exercises. Pt states she is now experiencing pain in her right shoulder and would like a Rx called in to help relieve the pain. Cb# [phone]

## 2017-11-03 NOTE — Telephone Encounter (Signed)
Although unusual, I would be ok for 4 hour workdays mon - Friday for total 2 months

## 2017-11-03 NOTE — Telephone Encounter (Signed)
Ok for tramadol prn, done erx

## 2017-11-03 NOTE — Addendum Note (Signed)
Addended by: [name], Katlynne Mckercher W on: 11/03/2017 08:45 PM   Modules accepted: Orders

## 2017-11-08 DIAGNOSIS — M810 Age-related osteoporosis without current pathological fracture: Secondary | ICD-10-CM | POA: Diagnosis not present

## 2017-11-08 DIAGNOSIS — Z7984 Long term (current) use of oral hypoglycemic drugs: Secondary | ICD-10-CM

## 2017-11-08 DIAGNOSIS — I5032 Chronic diastolic (congestive) heart failure: Secondary | ICD-10-CM | POA: Diagnosis not present

## 2017-11-08 DIAGNOSIS — Z9181 History of falling: Secondary | ICD-10-CM

## 2017-11-08 DIAGNOSIS — R41841 Cognitive communication deficit: Secondary | ICD-10-CM | POA: Diagnosis not present

## 2017-11-08 DIAGNOSIS — E039 Hypothyroidism, unspecified: Secondary | ICD-10-CM | POA: Diagnosis not present

## 2017-11-08 DIAGNOSIS — I8390 Asymptomatic varicose veins of unspecified lower extremity: Secondary | ICD-10-CM | POA: Diagnosis not present

## 2017-11-08 DIAGNOSIS — M5136 Other intervertebral disc degeneration, lumbar region: Secondary | ICD-10-CM | POA: Diagnosis not present

## 2017-11-08 DIAGNOSIS — S42201D Unspecified fracture of upper end of right humerus, subsequent encounter for fracture with routine healing: Secondary | ICD-10-CM | POA: Diagnosis not present

## 2017-11-08 DIAGNOSIS — E1122 Type 2 diabetes mellitus with diabetic chronic kidney disease: Secondary | ICD-10-CM | POA: Diagnosis not present

## 2017-11-08 DIAGNOSIS — N184 Chronic kidney disease, stage 4 (severe): Secondary | ICD-10-CM | POA: Diagnosis not present

## 2017-11-08 DIAGNOSIS — E785 Hyperlipidemia, unspecified: Secondary | ICD-10-CM | POA: Diagnosis not present

## 2017-11-08 DIAGNOSIS — Z993 Dependence on wheelchair: Secondary | ICD-10-CM

## 2017-11-08 DIAGNOSIS — I130 Hypertensive heart and chronic kidney disease with heart failure and stage 1 through stage 4 chronic kidney disease, or unspecified chronic kidney disease: Secondary | ICD-10-CM | POA: Diagnosis not present

## 2017-11-08 DIAGNOSIS — G2581 Restless legs syndrome: Secondary | ICD-10-CM | POA: Diagnosis not present

## 2017-11-08 NOTE — Telephone Encounter (Signed)
Forms have been signed, & faxed to [phone], Copy sent to scan & charged for under daughter [facility](Sheryl) chart.  LVM for Sheryl to inform her the original is ready to be picked up.

## 2017-11-09 NOTE — Progress Notes (Signed)
PALLIATIVE CARE CONSULT VISIT   PATIENT NAME: Michelle Morton DOB: [birth year] MRN: [id number]  PRIMARY CARE PROVIDER:   [name], [name], MD  REFERRING PROVIDER:      [name], [name], MD [address] [place] [id number]  RESPONSIBLE PARTY:  self        RECOMMENDATIONS and PLAN:  1.Shortness of breath R06.02:  Chronic state.  Deconditioned, CHF and CKD. Fluid restrictions. Monitor for future needs for home O2.   2. Weakness R53.1-  Unchanged.  Continues to require significant assistance of ADLs.   PT and OT for R upper extremity rehab post fracture.  Air cabin crewGait/transfer training.  Consider trapeze bar for repositioning.  3. Advanced care planning-DNR and MOST form previously completed. She has resumed living with daughter [name] who has considered placement in a SNF but pt. Has no desire for same.  Palliative care will continue to follow.  Pt may resume Hospice services after completion of therapy.  I spent 20 minutes providing this consultation,  from 2:00pm to 2:20pm at home. More than 50% of the time in this consultation was spent coordinating communication with patient, daughter [name] and PT.  HISTORY OF PRESENT ILLNESS:  Followup with  Michelle Morton who is now living with a different daughter.  She continues to require full assistance from family and home health staff for ADLs and therapies.  She continues to have overall weakness and difficulty with ambulation.  Appetite is fair.  Hospital bed is being delivered today.  CODE STATUS: DNR/DNI  PPS: 40%   Mobile via WC HOSPICE ELIGIBILITY/DIAGNOSIS: TBD.  Receiving home therapies  PAST MEDICAL HISTORY:  Past Medical History:  Diagnosis Date  . Allergic rhinitis, cause unspecified 02/14/2013  . Arthritis   . Arthus phenomenon   . Diabetes mellitus, type 2 (HCC)   . Hearing loss    right ear, no hearing aid  . History of blood transfusion    [facility] - unsure number of units transfused  . Hyperlipidemia   . Hypertension    . Hypothyroidism   . Kidney stones   . Osteoarthritis, knee    knees - otc med prn  . SVD (spontaneous vaginal delivery)    x 4  . Varicose veins    lower legs    SOCIAL HX:  Social History   Tobacco Use  . Smoking status: Former Smoker    Packs/day: 1.00    Years: 16.00    Pack years: 16.00    Types: Cigarettes    Last attempt to quit: 05/10/1992    Years since quitting: 25.5  . Smokeless tobacco: Never Used  Substance Use Topics  . Alcohol use: No    Alcohol/week: 0.0 oz    ALLERGIES:  Allergies  Allergen Reactions  . Penicillins Rash    Allergic to IV penicillin but tolerates the oral form Has patient had a PCN reaction causing immediate rash, facial/tongue/throat swelling, SOB or lightheadedness with hypotension: no Has patient had a PCN reaction causing severe rash involving mucus membranes or skin necrosis: no Has patient had a PCN reaction that required hospitalization already in the hospital for other procedure  Has patient had a PCN reaction occurring within the last 10 years: no If all of the above answers are "NO", then ma     PERTINENT MEDICATIONS:  Outpatient Encounter Medications as of 10/27/2017  Medication Sig  . acetaminophen (TYLENOL) 325 MG tablet Take 2 tablets (650 mg total) by mouth daily  as needed for mild pain, fever or headache.  . albuterol (PROVENTIL) (2.5 MG/3ML) 0.083% nebulizer solution Take 3 mLs (2.5 mg total) by nebulization every 4 (four) hours as needed for wheezing or shortness of breath.  . cetirizine (ZYRTEC) 10 MG tablet Take 10 mg by mouth daily as needed for allergies.   Marland Kitchen. diclofenac sodium (VOLTAREN) 1 % GEL APPLY 4 GRAMS TOPICALLY FOUR TIMES DAILY as needed (Patient taking differently: Apply 4 g topically 4 (four) times daily as needed (PAIN). )  . docusate sodium (COLACE) 100 MG capsule Take 1 capsule (100 mg total) by mouth 2 (two) times daily.  . furosemide (LASIX) 80 MG tablet 1 tab by mouth in the AM and 1 1/2 tab in the PM  per renal  . gabapentin (NEURONTIN) 100 MG capsule Take 2 capsules (200 mg total) by mouth at bedtime.  Marland Kitchen. guaiFENesin (MUCINEX) 600 MG 12 hr tablet Take 1,200 mg by mouth daily.  Marland Kitchen. HYDROcodone-acetaminophen (NORCO/VICODIN) 5-325 MG tablet Take 1 tablet by mouth every 6 (six) hours as needed for severe pain.  Marland Kitchen. levothyroxine (SYNTHROID, LEVOTHROID) 88 MCG tablet Take 1 tablet (88 mcg total) by mouth daily.  . methocarbamol (ROBAXIN) 500 MG tablet Take 1 tablet (500 mg total) by mouth every 8 (eight) hours as needed for muscle spasms.  . Oxymetazoline HCl (NASAL SPRAY NA) Place 1 spray into the nose 2 (two) times daily as needed (allergies).   . pantoprazole (PROTONIX) 40 MG tablet Take 1 tablet (40 mg total) by mouth 2 (two) times daily.  Marland Kitchen. senna-docusate (SENOKOT-S) 8.6-50 MG tablet Take 1 tablet by mouth at bedtime as needed for mild constipation.  . sitaGLIPtin (JANUVIA) 50 MG tablet Take 1 tablet (50 mg total) by mouth daily.   No facility-administered encounter medications on file as of 10/27/2017.     PHYSICAL EXAM:   BP 120/62  P 74  R22  O2 sats 98% General:  Well nourished elderly female sitting in recliner Cardiovascular: regular rate and rhythm Pulmonary:Fine expiratory wheezing of upper fields.  Mildly short of breath Abdomen: soft, nontender, + bowel sounds GU: no suprapubic tenderness Extremities: No ROM of RUE. Sling in use  1+ edema of mid calf to feet. Compression socks in use.   Skin: Exposed skin is intact Neurological: Alert and oriented x 3. Speaks in sentences. Weakness   Margaretha SheffieldShirley Crystalee Ventress, NP-C

## 2017-11-11 ENCOUNTER — Other Ambulatory Visit: Payer: Medicare Other | Admitting: Internal Medicine

## 2017-11-18 ENCOUNTER — Telehealth: Payer: Self-pay | Admitting: Internal Medicine

## 2017-11-18 ENCOUNTER — Encounter: Payer: Self-pay | Admitting: Internal Medicine

## 2017-11-18 ENCOUNTER — Ambulatory Visit (INDEPENDENT_AMBULATORY_CARE_PROVIDER_SITE_OTHER): Payer: Medicare Other | Admitting: Internal Medicine

## 2017-11-18 ENCOUNTER — Other Ambulatory Visit (INDEPENDENT_AMBULATORY_CARE_PROVIDER_SITE_OTHER): Payer: Medicare Other

## 2017-11-18 DIAGNOSIS — M25511 Pain in right shoulder: Secondary | ICD-10-CM | POA: Insufficient documentation

## 2017-11-18 DIAGNOSIS — I10 Essential (primary) hypertension: Secondary | ICD-10-CM | POA: Diagnosis not present

## 2017-11-18 DIAGNOSIS — E083299 Diabetes mellitus due to underlying condition with mild nonproliferative diabetic retinopathy without macular edema, unspecified eye: Secondary | ICD-10-CM | POA: Diagnosis not present

## 2017-11-18 DIAGNOSIS — I5032 Chronic diastolic (congestive) heart failure: Secondary | ICD-10-CM

## 2017-11-18 DIAGNOSIS — G8929 Other chronic pain: Secondary | ICD-10-CM | POA: Diagnosis not present

## 2017-11-18 LAB — BASIC METABOLIC PANEL
BUN: 120 mg/dL — AB (ref 6–23)
CHLORIDE: 96 meq/L (ref 96–112)
CO2: 28 mEq/L (ref 19–32)
CREATININE: 2.53 mg/dL — AB (ref 0.40–1.20)
Calcium: 8.3 mg/dL — ABNORMAL LOW (ref 8.4–10.5)
GFR: 23.29 mL/min — AB (ref >60.00)
Glucose, Bld: 168 mg/dL — ABNORMAL HIGH (ref 70–99)
Potassium: 4.6 mEq/L (ref 3.5–5.1)
Sodium: 136 mEq/L (ref 135–145)

## 2017-11-18 LAB — BRAIN NATRIURETIC PEPTIDE: PRO B NATRI PEPTIDE: 98 pg/mL (ref 0.0–100.0)

## 2017-11-18 MED ORDER — HYDROCODONE-ACETAMINOPHEN 5-325 MG PO TABS: 1.0000 | ORAL_TABLET | Freq: Every day | ORAL | 0 refills | Status: DC | PRN

## 2017-11-18 NOTE — Assessment & Plan Note (Signed)
stable overall by history and exam, recent data reviewed with pt, and pt to continue medical treatment as before,  to f/u any worsening symptoms or concerns BP Readings from Last 3 Encounters:  11/18/17 124/78  11/01/17 112/72  10/21/17 (!) 114/48

## 2017-11-18 NOTE — Telephone Encounter (Signed)
Copied from CRM [phone]. Topic: General - Other >> Nov 18, 2017  2:29 PM Leafy Roobinson, Norma J wrote: Reason for CRM: [facility] is calling and would like to know what hydrocodone is use for  not dx code. Chronic pain is not sufficient

## 2017-11-18 NOTE — Assessment & Plan Note (Signed)
stable overall by history and exam, recent data reviewed with pt, and pt to continue medical treatment as before,  to f/u any worsening symptoms or concerns Lab Results  Component Value Date   HGBA1C 6.1 12/10/2016

## 2017-11-18 NOTE — Assessment & Plan Note (Signed)
To cont PT but also for hydrocodone prn

## 2017-11-18 NOTE — Telephone Encounter (Signed)
Spoke with pharmacist and informed her that pt has DJD and osteoarthritis taking hydrocodone for chronic pain

## 2017-11-18 NOTE — Telephone Encounter (Signed)
Can be discussed at OV today at 1.20pm

## 2017-11-18 NOTE — Assessment & Plan Note (Signed)
stable overall by history and exam, recent data reviewed with pt, and pt to continue medical treatment as before,  to f/u any worsening symptoms or concerns  

## 2017-11-18 NOTE — Telephone Encounter (Signed)
Copied from CRM [phone]. Topic: Quick Communication - See Telephone Encounter >> Nov 18, 2017 11:48 AM Arlyss Gandyichardson, Sulma Ruffino N, NT wrote: CRM for notification. See Telephone encounter for: 11/18/17. Pts daughter Asencion PartridgeCheryl Halley would like to see if [name] can write for her hours to be changed for 2 months to work from 10:00am-6:30pm to care for her mom. She would like this addressed to the [employer].

## 2017-11-18 NOTE — Progress Notes (Signed)
Subjective:    Patient ID: Michelle Morton, female    DOB: 10/02/1931, 84 y.o.   MRN: [id number]  HPI  Here to f/u; overall doing ok,  Pt denies chest pain, increasing sob or doe, wheezing, orthopnea, PND, increased LE swelling, palpitations, dizziness or syncope.  Pt denies new neurological symptoms such as new headache, or facial or extremity weakness or numbness.  Pt denies polydipsia, polyuria, or low sugar episode.  Pt states overall good compliance with meds, mostly trying to follow appropriate diet, with wt overall stable on current diuretics. Wt Readings from Last 3 Encounters:  11/01/17 193 lb (87.5 kg)  10/21/17 193 lb (87.5 kg)  09/06/17 181 lb 14.1 oz (82.5 kg)  States ok for hospice in 2 wks, wants to finish PT. Did have some diarrhea without pain or fever this am, better with immodium.  Has ongoing right shoulder pain but believes PT helping, though still cannot raise the arm above shoulder level due to pain.  Tramadol not enough for pain.  Past Medical History:  Diagnosis Date  . Allergic rhinitis, cause unspecified 02/14/2013  . Arthritis   . Arthus phenomenon   . Diabetes mellitus, type 2 (HCC)   . Hearing loss    right ear, no hearing aid  . History of blood transfusion    [name] - unsure number of units transfused  . Hyperlipidemia   . Hypertension   . Hypothyroidism   . Kidney stones   . Osteoarthritis, knee    knees - otc med prn  . SVD (spontaneous vaginal delivery)    x 4  . Varicose veins    lower legs   Past Surgical History:  Procedure Laterality Date  . ABDOMINAL HYSTERECTOMY    . ANTERIOR AND POSTERIOR REPAIR N/A 04/17/2014   Procedure: ANTERIOR (CYSTOCELE) AND POSTERIOR REPAIR (RECTOCELE);  Surgeon: Lavina Hamman, MD;  Location: [facility];  Service: Gynecology;  Laterality: N/A;  . APPENDECTOMY    . CATARACT EXTRACTION Bilateral   . COLONOSCOPY    . KIDNEY STONE SURGERY     removal of stone  . REPLACEMENT TOTAL KNEE Bilateral   . TONSILLECTOMY     . WISDOM TOOTH EXTRACTION      reports that she quit smoking about 25 years ago. Her smoking use included cigarettes. She has a 16.00 pack-year smoking history. She has never used smokeless tobacco. She reports that she does not drink alcohol or use drugs. family history includes Breast cancer in her daughter; Diabetes in her brother, father, mother, and sister; Hypertension in her father. Allergies  Allergen Reactions  . Penicillins Rash    Allergic to IV penicillin but tolerates the oral form Has patient had a PCN reaction causing immediate rash, facial/tongue/throat swelling, SOB or lightheadedness with hypotension: no Has patient had a PCN reaction causing severe rash involving mucus membranes or skin necrosis: no Has patient had a PCN reaction that required hospitalization already in the hospital for other procedure  Has patient had a PCN reaction occurring within the last 10 years: no If all of the above answers are "NO", then ma   Current Outpatient Medications on File Prior to Visit  Medication Sig Dispense Refill  . acetaminophen (TYLENOL) 325 MG tablet Take 2 tablets (650 mg total) by mouth daily as needed for mild pain, fever or headache.    . albuterol (PROVENTIL) (2.5 MG/3ML) 0.083% nebulizer solution Take 3 mLs (2.5 mg total) by nebulization every 4 (four) hours as needed for wheezing  or shortness of breath. 75 mL 12  . cetirizine (ZYRTEC) 10 MG tablet Take 10 mg by mouth daily as needed for allergies.     Marland Kitchen. diclofenac sodium (VOLTAREN) 1 % GEL APPLY 4 GRAMS TOPICALLY FOUR TIMES DAILY as needed (Patient taking differently: Apply 4 g topically 4 (four) times daily as needed (PAIN). ) 400 g 3  . docusate sodium (COLACE) 100 MG capsule Take 1 capsule (100 mg total) by mouth 2 (two) times daily. 10 capsule 0  . furosemide (LASIX) 80 MG tablet 1 tab by mouth in the AM and 1 1/2 tab in the PM per renal 90 tablet 3  . gabapentin (NEURONTIN) 100 MG capsule Take 2 capsules (200 mg total)  by mouth at bedtime. 60 capsule 3  . guaiFENesin (MUCINEX) 600 MG 12 hr tablet Take 1,200 mg by mouth daily.    Marland Kitchen. levothyroxine (SYNTHROID, LEVOTHROID) 88 MCG tablet Take 1 tablet (88 mcg total) by mouth daily. 90 tablet 0  . methocarbamol (ROBAXIN) 500 MG tablet Take 1 tablet (500 mg total) by mouth every 8 (eight) hours as needed for muscle spasms. 10 tablet 0  . Oxymetazoline HCl (NASAL SPRAY NA) Place 1 spray into the nose 2 (two) times daily as needed (allergies).     . pantoprazole (PROTONIX) 40 MG tablet Take 1 tablet (40 mg total) by mouth 2 (two) times daily.    Marland Kitchen. senna-docusate (SENOKOT-S) 8.6-50 MG tablet Take 1 tablet by mouth at bedtime as needed for mild constipation.    . sitaGLIPtin (JANUVIA) 50 MG tablet Take 1 tablet (50 mg total) by mouth daily. 90 tablet 3   No current facility-administered medications on file prior to visit.    Review of Systems  Constitutional: Negative for other unusual diaphoresis or sweats HENT: Negative for ear discharge or swelling Eyes: Negative for other worsening visual disturbances Respiratory: Negative for stridor or other swelling  Gastrointestinal: Negative for worsening distension or other blood Genitourinary: Negative for retention or other urinary change Musculoskeletal: Negative for other MSK pain or swelling Skin: Negative for color change or other new lesions Neurological: Negative for worsening tremors and other numbness  Psychiatric/Behavioral: Negative for worsening agitation or other fatigue All other system neg per pt    Objective:   Physical Exam BP 124/78   Pulse 73   Temp 97.9 F (36.6 C) (Oral)   Ht 5\' 3"  (1.6 m)   SpO2 98%   BMI 34.19 kg/m  VS noted,  Constitutional: Pt appears in NAD HENT: Head: NCAT.  Right Ear: External ear normal.  Left Ear: External ear normal.  Eyes: . Pupils are equal, round, and reactive to light. Conjunctivae and EOM are normal Nose: without d/c or deformity Neck: Neck supple. Gross  normal ROM Cardiovascular: Normal rate and regular rhythm.   Pulmonary/Chest: Effort normal and breath sounds without rales or wheezing.  Abd:  Soft, NT, ND, + BS, no organomegaly Neurological: Pt is alert. At baseline orientation, motor grossly intact Skin: Skin is warm. No rashes, other new lesions, trace bilat LE edema Psychiatric: Pt behavior is normal without agitation         Assessment & Plan:

## 2017-11-18 NOTE — Patient Instructions (Addendum)
Ok to stop the tramadol  Please take all new medication as prescribed - the hydrocodone for pain as needed ( and call if you need refills next month)  Please continue all other medications as before, and refills have been done if requested.  Please have the pharmacy call with any other refills you may need.  Please keep your appointments with your specialists as you may have planned  Please go to the LAB in the Basement (turn left off the elevator) for the tests to be done today  You will be contacted by phone if any changes need to be made immediately.  Otherwise, you will receive a letter about your results with an explanation, but please check with MyChart first.  Please remember to sign up for MyChart if you have not done so, as this will be important to you in the future with finding out test results, communicating by private email, and scheduling acute appointments online when needed.  Please return in 3 months, or sooner if needed

## 2017-11-23 NOTE — Telephone Encounter (Signed)
Patient's daughter called checking on this letter. She said that it was discussed in the office visit and that someone was going to call her when it was ready but she has not heard from anyone.  Please advise.

## 2017-11-24 ENCOUNTER — Telehealth: Payer: Self-pay

## 2017-11-24 ENCOUNTER — Encounter: Payer: Self-pay | Admitting: Internal Medicine

## 2017-11-24 NOTE — Telephone Encounter (Signed)
Elyse JarvisShery has been informed letter is completed and ready for pick up at the front desk.

## 2017-11-24 NOTE — Telephone Encounter (Signed)
Copied from CRM [id number]. Topic: General - Other >> Nov 24, 2017  9:19 AM Percival SpanishKennedy, Cheryl W wrote:  Pt daughter would like a call back about the letter she left on Monday . Cal lwhen ready    Sheryl pt daughter  [phone]

## 2017-11-24 NOTE — Telephone Encounter (Signed)
Probably will be able to done today, thanks

## 2017-11-30 ENCOUNTER — Telehealth: Payer: Self-pay | Admitting: Internal Medicine

## 2017-11-30 NOTE — Telephone Encounter (Signed)
Ok for verbals 

## 2017-11-30 NOTE — Telephone Encounter (Signed)
Copied from CRM [phone]. Topic: Quick Communication - See Telephone Encounter >> Nov 30, 2017  9:57 AM Herby AbrahamJohnson, Shiquita C wrote: CRM for notification. See Telephone encounter for: 11/30/17.  Mo with [facility] called in to request verbal orders for skilled nursing for pt.   Frequency: 1 visit for reassessment .   CB: [phone]

## 2017-11-30 NOTE — Telephone Encounter (Signed)
Notified Mo w/MD response../lmb 

## 2017-12-05 ENCOUNTER — Ambulatory Visit: Payer: Medicare Other | Admitting: Internal Medicine

## 2017-12-07 ENCOUNTER — Ambulatory Visit (INDEPENDENT_AMBULATORY_CARE_PROVIDER_SITE_OTHER): Payer: Medicare Other | Admitting: Allergy and Immunology

## 2017-12-07 ENCOUNTER — Encounter: Payer: Self-pay | Admitting: Allergy and Immunology

## 2017-12-07 VITALS — BP 132/64 | HR 78 | Temp 98.5°F | Resp 18 | Ht 61.0 in | Wt 192.0 lb

## 2017-12-07 DIAGNOSIS — J3089 Other allergic rhinitis: Secondary | ICD-10-CM | POA: Diagnosis not present

## 2017-12-07 DIAGNOSIS — J4489 Other specified chronic obstructive pulmonary disease: Secondary | ICD-10-CM

## 2017-12-07 DIAGNOSIS — J449 Chronic obstructive pulmonary disease, unspecified: Secondary | ICD-10-CM

## 2017-12-07 DIAGNOSIS — K219 Gastro-esophageal reflux disease without esophagitis: Secondary | ICD-10-CM | POA: Diagnosis not present

## 2017-12-07 MED ORDER — FLUTICASONE PROPIONATE 50 MCG/ACT NA SUSP: 1.0000 | Freq: Two times a day (BID) | NASAL | 5 refills | Status: AC

## 2017-12-07 MED ORDER — RANITIDINE HCL 150 MG PO CAPS: 150.0000 mg | ORAL_CAPSULE | Freq: Every evening | ORAL | 5 refills | Status: AC

## 2017-12-07 MED ORDER — LORATADINE 10 MG PO TABS: 10.0000 mg | ORAL_TABLET | Freq: Every day | ORAL | 5 refills | Status: AC | PRN

## 2017-12-07 MED ORDER — BUDESONIDE 0.5 MG/2ML IN SUSP: 0.5000 mg | Freq: Two times a day (BID) | RESPIRATORY_TRACT | 5 refills | Status: AC

## 2017-12-07 MED ORDER — MONTELUKAST SODIUM 10 MG PO TABS: 10.0000 mg | ORAL_TABLET | Freq: Every day | ORAL | 5 refills | Status: DC

## 2017-12-07 NOTE — Patient Instructions (Addendum)
  1.  Treat and prevent reflux:   A.  Continue Protonix 40 mg twice a day  B.  Start ranitidine 150 mg in evening  2.  Treat and prevent Inflammation:   A.  Budesonide 0.5 mg nebulized twice a day  B.  Flonase 1 spray each nostril twice a day  C.  Montelukast 10 mg tablet once a day  3.  If needed:   A.  Loratadine 10 mg 1 tablet 1 time per day  B.  DuoNeb nebulization every 4-6 hours  C. Mucinex   4.  Blood -alpha 1 antitrypsin level and phenotype, area two aero allergen profile, IgA/G/M  5. Obtain nocturnal oximetry  6.  Return to clinic in 3 weeks or earlier if problem

## 2017-12-07 NOTE — Progress Notes (Signed)
Dear [name],  Thank you for referring Michelle KnudsenMamie W Kimrey to [facility] on 12/07/2017.   Below is a summation of this patient's evaluation and recommendations.  Thank you for your referral. I will keep you informed about this patient's response to treatment.   If you have any questions please do not hesitate to contact me.   Sincerely,  Jessica PriestEric J. Nathian Stencil, MD Allergy / Immunology [facility]   ______________________________________________________________________    NEW PATIENT NOTE  Referring Provider: [name], [name], MD Primary Provider: [name], [name], MD Date of office visit: 12/07/2017    Subjective:   Chief Complaint:  Michelle Morton (DOB: 1932/03/31) is a 84 y.o. female who presents to the clinic on 12/07/2017 with a chief complaint of Asthma .     HPI: Lashon presents to this clinic in evaluation of respiratory tract problems.  She complains about coughing and wheezing and shortness of breath that appears to respond to a short acting bronchodilator over the course of the past 5 years.  Although this does appear to occur throughout the day this especially occurs after eating.  Apparently she was given a short acting bronchodilator via nebulization while she was at rehab recently and she did feel as though this helped her significantly but she does not have any form of short acting bronchodilator at this point in time.  She does not have any nocturnal bronchospastic symptoms or significant amounts of phlegm production or chest pain with this issue.  There is no obvious provoking factor giving rise to this issue.  She also has a history of nasal congestion and sneezing and occasionally runny eyes for which she uses Flonase twice a day which does help this issue.  This has been an issue of many years occurring on a perennial basis without any obvious trigger except for possibly exposure to  flowers.  She has been given antihistamines in the past but she does not use these agents at this point and relies on the use of Mucinex which does help some of her mucus production.  She also has an issue with constant throat clearing and drainage stuck in her throat.  Her daughter states that she is always clearing her throat.  She uses cough drops throughout the entire day to help calm down her throat.  She feels as though there is something stuck in her throat that she cannot clear out.  Apparently she has had a evaluation with ENT several years ago who performed rhinoscopy and the results of that evaluation are unknown.  She does have reflux for which she is using a proton pump inhibitor twice a day.  While utilizing this medication she still has lots of belching and she has a fullness in her abdomen that she needs to relieve with belching.  She does not consume any caffeine or eat any chocolate.  She also has a history of tobacco smoking of greater than 50 years duration which she discontinued around 1994.  As well, she has chronic renal failure and leg swelling presently treated with a diuretic.  Past Medical History:  Diagnosis Date  . Allergic rhinitis, cause unspecified 02/14/2013  . Arthritis   . Arthus phenomenon   . Asthma   . Diabetes mellitus, type 2 (HCC)   . Hearing loss    right ear, no hearing aid  . History of blood transfusion    [facility] - unsure number of units transfused  . Hyperlipidemia   . Hypertension   . Hypothyroidism   . Kidney stones   . Osteoarthritis, knee    knees - otc med prn  . SVD (spontaneous vaginal delivery)    x 4  . Varicose veins    lower legs    Past Surgical History:  Procedure Laterality Date  . ABDOMINAL HYSTERECTOMY    . ANTERIOR AND POSTERIOR REPAIR N/A 04/17/2014   Procedure: ANTERIOR (CYSTOCELE) AND POSTERIOR REPAIR (RECTOCELE);  Surgeon: Lavina Hamman, MD;  Location: [facility];  Service: Gynecology;  Laterality: N/A;  .  APPENDECTOMY    . CATARACT EXTRACTION Bilateral   . COLONOSCOPY    . KIDNEY STONE SURGERY     removal of stone  . REPLACEMENT TOTAL KNEE Bilateral   . TONSILLECTOMY    . WISDOM TOOTH EXTRACTION      Allergies as of 12/07/2017      Reactions   Penicillins Rash   Allergic to IV penicillin but tolerates the oral form Has patient had a PCN reaction causing immediate rash, facial/tongue/throat swelling, SOB or lightheadedness with hypotension: no Has patient had a PCN reaction causing severe rash involving mucus membranes or skin necrosis: no Has patient had a PCN reaction that required hospitalization already in the hospital for other procedure  Has patient had a PCN reaction occurring within the last 10 years: no If all of the above answers are "NO", then ma      Medication List      acetaminophen 325 MG tablet Commonly known as:  TYLENOL Take 2 tablets (650 mg total) by mouth daily as needed for mild pain, fever or headache.   albuterol (2.5 MG/3ML) 0.083% nebulizer solution Commonly known as:  PROVENTIL Take 3 mLs (2.5 mg total) by nebulization every 4 (four) hours as needed for wheezing or shortness of breath.   cetirizine 10 MG tablet Commonly known as:  ZYRTEC Take 10 mg by mouth daily as needed for allergies.   diclofenac sodium 1 % Gel Commonly known as:  VOLTAREN APPLY 4 GRAMS TOPICALLY FOUR TIMES DAILY as needed   docusate sodium 100 MG capsule Commonly known as:  COLACE Take 1 capsule (100 mg total) by mouth 2 (two) times daily.   furosemide 80 MG tablet Commonly known as:  LASIX 1 tab by mouth in the AM and 1 1/2 tab in the PM per renal   gabapentin 100 MG capsule Commonly known as:  NEURONTIN Take 2 capsules (200 mg total) by mouth at bedtime.   guaiFENesin 600 MG 12 hr tablet Commonly known as:  MUCINEX Take 1,200 mg by mouth daily.   HYDROcodone-acetaminophen 5-325 MG tablet Commonly known as:  NORCO/VICODIN Take 1 tablet by mouth daily as needed  for severe pain.   levothyroxine 88 MCG tablet Commonly known as:  SYNTHROID, LEVOTHROID Take 1 tablet (88 mcg total) by mouth daily.   methocarbamol 500 MG tablet Commonly known as:  ROBAXIN Take 1 tablet (500 mg total) by mouth every 8 (eight) hours as needed for muscle spasms.   NASAL SPRAY NA Place 1 spray into the nose 2 (two) times daily as needed (allergies).   pantoprazole 40 MG tablet Commonly known as:  PROTONIX Take 1 tablet (40 mg total) by mouth 2 (two) times daily.   senna-docusate 8.6-50 MG tablet Commonly known as:  Senokot-S Take 1 tablet by mouth at bedtime as needed for mild constipation.   sitaGLIPtin 50 MG tablet Commonly  known as:  JANUVIA Take 1 tablet (50 mg total) by mouth daily.       Review of systems negative except as noted in HPI / PMHx or noted below:  Review of Systems  Constitutional: Negative.   HENT: Negative.   Eyes: Negative.   Respiratory: Negative.   Cardiovascular: Negative.   Gastrointestinal: Negative.   Genitourinary: Negative.   Musculoskeletal: Negative.   Skin: Negative.   Neurological: Negative.   Endo/Heme/Allergies: Negative.   Psychiatric/Behavioral: Negative.     Family History  Problem Relation Age of Onset  . Diabetes Mother   . Diabetes Father   . Hypertension Father   . Breast cancer Daughter   . Diabetes Brother        x 1  . Diabetes Sister        x 4  . Colon cancer Neg Hx   . Esophageal cancer Neg Hx   . Pancreatic cancer Neg Hx   . Liver disease Neg Hx   . Kidney disease Neg Hx     Social History   Socioeconomic History  . Marital status: Married    Spouse name: Not on file  . Number of children: Not on file  . Years of education: Not on file  . Highest education level: Not on file  Occupational History  . Not on file  Social Needs  . Financial resource strain: Not on file  . Food insecurity:    Worry: Not on file    Inability: Not on file  . Transportation needs:    Medical: Not  on file    Non-medical: Not on file  Tobacco Use  . Smoking status: Former Smoker    Packs/day: 1.00    Years: 16.00    Pack years: 16.00    Types: Cigarettes    Last attempt to quit: 05/10/1992    Years since quitting: 25.5  . Smokeless tobacco: Never Used  Substance and Sexual Activity  . Alcohol use: No    Alcohol/week: 0.0 oz  . Drug use: No  . Sexual activity: Yes    Birth control/protection: Post-menopausal, Surgical    Comment: Hysterectomy  Lifestyle  . Physical activity:    Days per week: Not on file    Minutes per session: Not on file  . Stress: Not on file  Relationships  . Social connections:    Talks on phone: Not on file    Gets together: Not on file    Attends religious service: Not on file    Active member of club or organization: Not on file    Attends meetings of clubs or organizations: Not on file    Relationship status: Not on file  . Intimate partner violence:    Fear of current or ex partner: Not on file    Emotionally abused: Not on file    Physically abused: Not on file    Forced sexual activity: Not on file  Other Topics Concern  . 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    Environmental and Social history  Lives in a house with a dry environment, no animals located inside the household, carpet in the bedroom, plastic on the bed,  plastic on the pillow, and no smoking ongoing with inside the household.  Objective:   Vitals:   12/07/17 0911  BP: 132/64  Pulse: 78  Resp: 18  Temp: 98.5 F (36.9 C)  SpO2: 96%   Height: 5\' 1"  (154.9 cm) Weight: 192 lb (87.1 kg)  Physical Exam  Constitutional:  Raspy voice, throat clearing, cough drops use, wheelchair  HENT:  Head: Normocephalic.    Right Ear: Tympanic membrane, external ear and ear canal normal.  Left Ear: Tympanic membrane, external ear and ear canal normal.  Nose: Nose normal. No mucosal edema or rhinorrhea.  Mouth/Throat: Uvula is midline, oropharynx is clear and moist and mucous membranes are normal. No oropharyngeal exudate.  Eyes: Conjunctivae are normal.  Neck: Trachea normal. No tracheal tenderness present. No tracheal deviation present. No thyromegaly present.  Cardiovascular: Normal rate, regular rhythm, S1 normal, S2 normal and normal heart sounds.  No murmur heard. Pulmonary/Chest: Breath sounds normal. No stridor. No respiratory distress. She has no wheezes. She has no rales.  Musculoskeletal: She exhibits no edema.  Lymphadenopathy:       Head (right side): No tonsillar adenopathy present.       Head (left side): No tonsillar adenopathy present.    She has no cervical adenopathy.  Neurological: She is alert.  Skin: No rash noted. She is not diaphoretic. No erythema. Nails show no clubbing.    Diagnostics: Allergy skin tests were not performed secondary to the recent use of a antibiotic.   Spirometry was performed and demonstrated an FEV1 of 0.45 @ 29 % of predicted. FEV1/FVC = 0.69.  Results of blood tests obtained 21 Oct 2017 identified normal hepatic function with a creatinine of 2.64, elevated beta natruretic peptide at 128 PG/mL  Results of blood tests obtained 05 September 2017 identified WBC 10.2 with absolute eosinophils 0, absolute lymphocyte 500, hemoglobin 11.5, platelet 227  Results of a sinus CT scan obtained 05 September 2017 identified the following:  Osseous: No fracture or mandibular dislocation. No destructive process. Multiple dental caries and tooth extractions.  Orbits: Negative. No traumatic or inflammatory finding.  Sinuses: Clear.  Concha bullosa of the right middle turbinate.  Soft tissues: Negative.  Results of the chest x-ray obtained for January 2019 identified the  following:  Shallow inspiration. Mild cardiac enlargement and pulmonary vascular congestion. Developing fine interstitial pattern suggest early developing interstitial edema. Calcification of the aorta. No pneumothorax.  Results of an echocardiogram obtained to December 2018 identified the following:  - Left ventricle: The cavity size was normal. Wall thickness was   normal. Systolic function was normal. The estimated ejection   fraction was in the range of 60% to 65%. Doppler parameters are   consistent with abnormal left ventricular relaxation (grade 1   diastolic dysfunction). - Mitral valve: Calcified annulus. Mildly thickened leaflets . - Left atrium: The atrium was mildly dilated. - Tricuspid valve: There was mild-moderate regurgitation. - Pulmonary arteries: PA peak pressure: 38 mm Hg (S).  Assessment and Plan:    1. COPD with asthma (HCC)   2. Other allergic rhinitis   3. LPRD (laryngopharyngeal reflux disease)     1.  Treat and prevent reflux:   A.  Continue Protonix 40 mg twice a day  B.  Start ranitidine 150 mg in evening  2.  Treat and prevent Inflammation:   A.  Budesonide 0.5 mg nebulized twice a day  B.  Flonase 1 spray each nostril twice a day  C.  Montelukast 10  mg tablet once a day  3.  If needed:   A.  Loratadine 10 mg 1 tablet 1 time per day  B.  DuoNeb nebulization every 4-6 hours  C. Mucinex   4.  Blood -alpha 1 antitrypsin level and phenotype, area two aero allergen profile, IgA/G/M  5. Obtain nocturnal oximetry  6.  Return to clinic in 3 weeks or earlier if problem  Noor appears to have significant inflammation and altered architecture of her lungs most likely given a combination of therapy including her prolonged tobacco use when she was younger and possible eosinophilic driven inflammation for which we will have her consistently use anti-inflammatory agents for respiratory tract as noted above.  In addition her history is quite consistent  with LPR and I will have her be a little more aggressive about treating her reflux disease as noted above.  I will obtain a nocturnal oximetry given the level of airflow deficit she possesses and we will evaluate her atopic disease and the presence of possible alpha-1 antitrypsin deficiency with the blood tests noted above.  I will see her back in this clinic in 3 weeks or earlier if there is a problem.  Jessica Priest, MD Allergy / Immunology [facility]

## 2017-12-08 ENCOUNTER — Encounter: Payer: Self-pay | Admitting: Allergy and Immunology

## 2017-12-08 ENCOUNTER — Telehealth: Payer: Self-pay

## 2017-12-08 DIAGNOSIS — J449 Chronic obstructive pulmonary disease, unspecified: Secondary | ICD-10-CM

## 2017-12-08 LAB — ALPHA-1-ANTITRYPSIN DEFICIENCY

## 2017-12-08 NOTE — Telephone Encounter (Signed)
Labs are ordered and called [facility] to add they will call us to let us know if they do not have enough blood.

## 2017-12-08 NOTE — Telephone Encounter (Signed)
-----   Message from Jessica PriestEric J Kozlow, MD sent at 12/08/2017  7:44 AM EDT ----- Are we sure we ordered the correct lab test. We need alpha-1 antitrypsin level and phenotype.  It looks like we ordered a DNA test.  Please troubleshoot.

## 2017-12-08 NOTE — Progress Notes (Unsigned)
PALLIATIVE CARE CONSULT VISIT   PATIENT NAME: Michelle KnudsenMamie W Morton DOB: [birth year] MRN: [id number]  PRIMARY CARE PROVIDER:   [name], [name], MD  REFERRING PROVIDER:      [name], [name], MD [address] [place] [id number]  RESPONSIBLE PARTY:  self        RECOMMENDATIONS and PLAN:  1.Shortness of breath R06.02:  Chronic state.  Deconditioned, CHF and CKD. Fluid restrictions. Monitor for future needs for home O2.   2. Weakness R53.1-  Unchanged.  Continues to require significant assistance of ADLs.   PT and OT for R upper extremity rehab post fracture.  Air cabin crewGait/transfer training.  Consider trapeze bar for repositioning.  3. Advanced care planning-DNR and MOST form previously completed. She has resumed living with daughter Michelle Morton who has considered placement in a SNF but pt. Has no desire for same.  Palliative care will continue to follow.  Pt may resume Hospice services after completion of therapy.  I spent 20 minutes providing this consultation,  from 2:00pm to 2:20pm at home. More than 50% of the time in this consultation was spent coordinating communication with patient, daughter Michelle Morton and PT.  HISTORY OF PRESENT ILLNESS:  Followup with  Michelle Morton who is now living with a different daughter.  She continues to require full assistance from family and home health staff for ADLs and therapies.  She continues to have overall weakness and difficulty with ambulation.  Appetite is fair.  Hospital bed is being delivered today.  CODE STATUS: DNR/DNI  PPS: 40%   Mobile via WC HOSPICE ELIGIBILITY/DIAGNOSIS: TBD.  Receiving home therapies  PAST MEDICAL HISTORY:  Past Medical History:  Diagnosis Date  . Allergic rhinitis, cause unspecified 02/14/2013  . Arthritis   . Arthus phenomenon   . Diabetes mellitus, type 2 (HCC)   . Hearing loss    right ear, no hearing aid  . History of blood transfusion    Michelle Morton - unsure number of units transfused  . Hyperlipidemia   . Hypertension    . Hypothyroidism   . Kidney stones   . Osteoarthritis, knee    knees - otc med prn  . SVD (spontaneous vaginal delivery)    x 4  . Varicose veins    lower legs    SOCIAL HX:  Social History   Tobacco Use  . Smoking status: Former Smoker    Packs/day: 1.00    Years: 16.00    Pack years: 16.00    Types: Cigarettes    Last attempt to quit: 05/10/1992    Years since quitting: 25.5  . Smokeless tobacco: Never Used  Substance Use Topics  . Alcohol use: No    Alcohol/week: 0.0 oz    ALLERGIES:  Allergies  Allergen Reactions  . Penicillins Rash    Allergic to IV penicillin but tolerates the oral form Has patient had a PCN reaction causing immediate rash, facial/tongue/throat swelling, SOB or lightheadedness with hypotension: no Has patient had a PCN reaction causing severe rash involving mucus membranes or skin necrosis: no Has patient had a PCN reaction that required hospitalization already in the hospital for other procedure  Has patient had a PCN reaction occurring within the last 10 years: no If all of the above answers are "NO", then ma     PERTINENT MEDICATIONS:  Outpatient Encounter Medications as of 10/27/2017  Medication Sig  . acetaminophen (TYLENOL) 325 MG tablet Take 2 tablets (650 mg total) by mouth daily  as needed for mild pain, fever or headache.  . albuterol (PROVENTIL) (2.5 MG/3ML) 0.083% nebulizer solution Take 3 mLs (2.5 mg total) by nebulization every 4 (four) hours as needed for wheezing or shortness of breath.  . cetirizine (ZYRTEC) 10 MG tablet Take 10 mg by mouth daily as needed for allergies.   Marland Kitchen diclofenac sodium (VOLTAREN) 1 % GEL APPLY 4 GRAMS TOPICALLY FOUR TIMES DAILY as needed (Patient taking differently: Apply 4 g topically 4 (four) times daily as needed (PAIN). )  . docusate sodium (COLACE) 100 MG capsule Take 1 capsule (100 mg total) by mouth 2 (two) times daily.  . furosemide (LASIX) 80 MG tablet 1 tab by mouth in the AM and 1 1/2 tab in the PM  per renal  . gabapentin (NEURONTIN) 100 MG capsule Take 2 capsules (200 mg total) by mouth at bedtime.  Marland Kitchen guaiFENesin (MUCINEX) 600 MG 12 hr tablet Take 1,200 mg by mouth daily.  Marland Kitchen HYDROcodone-acetaminophen (NORCO/VICODIN) 5-325 MG tablet Take 1 tablet by mouth every 6 (six) hours as needed for severe pain.  Marland Kitchen levothyroxine (SYNTHROID, LEVOTHROID) 88 MCG tablet Take 1 tablet (88 mcg total) by mouth daily.  . methocarbamol (ROBAXIN) 500 MG tablet Take 1 tablet (500 mg total) by mouth every 8 (eight) hours as needed for muscle spasms.  . Oxymetazoline HCl (NASAL SPRAY NA) Place 1 spray into the nose 2 (two) times daily as needed (allergies).   . pantoprazole (PROTONIX) 40 MG tablet Take 1 tablet (40 mg total) by mouth 2 (two) times daily.  Marland Kitchen senna-docusate (SENOKOT-S) 8.6-50 MG tablet Take 1 tablet by mouth at bedtime as needed for mild constipation.  . sitaGLIPtin (JANUVIA) 50 MG tablet Take 1 tablet (50 mg total) by mouth daily.   No facility-administered encounter medications on file as of 10/27/2017.     PHYSICAL EXAM:   BP 122/62  P 75  R22  O2 sats 98% General:  Obese elderly female sitting in recliner Cardiovascular: regular rate and rhythm Pulmonary:Fine expiratory wheezing of upper fields.  Increased work of breathing Abdomen: soft, nontender, + bowel sounds GU: no suprapubic tenderness Extremities: No ROM of RUE.   1+ edema of mid calf to feet.    Skin: Exposed skin is intact Neurological: Alert and oriented x 3.Weakness   Michelle Sheffield, NP-C

## 2017-12-09 ENCOUNTER — Other Ambulatory Visit: Payer: Self-pay | Admitting: *Deleted

## 2017-12-09 ENCOUNTER — Telehealth: Payer: Self-pay | Admitting: *Deleted

## 2017-12-09 LAB — ALPHA-1 ANTITRYPSIN PHENOTYPE: A-1 Antitrypsin: 142 mg/dL (ref 90–200)

## 2017-12-09 LAB — SPECIMEN STATUS REPORT

## 2017-12-09 LAB — REQUEST PROBLEM

## 2017-12-09 LAB — ALLERGENS W/TOTAL IGE AREA 2
Alternaria Alternata IgE: <0.1 kU/L
Aspergillus Fumigatus IgE: <0.1 kU/L
Bermuda Grass IgE: 0.39 kU/L — AB
Cladosporium Herbarum IgE: <0.1 kU/L
Cockroach, German IgE: 0.18 kU/L — AB
Cottonwood IgE: 0.48 kU/L — AB
D Farinae IgE: <0.1 kU/L
IGE (IMMUNOGLOBULIN E), SERUM: 115 [IU]/mL (ref 6–495)
Johnson Grass IgE: 0.37 kU/L — AB
Maple/Box Elder IgE: 0.53 kU/L — AB
Mouse Urine IgE: <0.1 kU/L
Oak, White IgE: 0.75 kU/L — AB
Pecan, Hickory IgE: 0.19 kU/L — AB
Penicillium Chrysogen IgE: <0.1 kU/L
Pigweed, Rough IgE: 0.32 kU/L — AB
Ragweed, Short IgE: 0.34 kU/L — AB
SHEEP SORREL IGE QN: 0.37 kU/L — AB
T003-IGE COMMON SILVER BIRCH: 0.54 kU/L — AB
T006-IGE CEDAR, MOUNTAIN: 0.37 kU/L — AB
T008-IGE ELM, AMERICAN: 0.47 kU/L — AB
Timothy Grass IgE: 0.33 kU/L — AB
White Mulberry IgE: 0.14 kU/L — AB

## 2017-12-09 LAB — IGG, IGA, IGM
IGA/IMMUNOGLOBULIN A, SERUM: 244 mg/dL (ref 64–422)
IGG (IMMUNOGLOBIN G), SERUM: 1572 mg/dL (ref 700–1600)
IgM (Immunoglobulin M), Srm: 52 mg/dL (ref 26–217)

## 2017-12-09 MED ORDER — IPRATROPIUM-ALBUTEROL 0.5-2.5 (3) MG/3ML IN SOLN: 3.0000 mL | RESPIRATORY_TRACT | 5 refills | Status: DC | PRN

## 2017-12-09 NOTE — Telephone Encounter (Signed)
I spoke with Kenney Housemananya the hospice nurse and she inquired about [facility]. I explained to her the tx plan and sent in a prescription for DuoNeb which was outlined in Dr. Kathyrn LassKozlow's office note from 12/07/17.

## 2017-12-13 ENCOUNTER — Telehealth: Payer: Self-pay

## 2017-12-13 NOTE — Telephone Encounter (Signed)
[facility] called and advised due to patient having Hospice Care that they would not be able to perform the nocturnal oximetry because she has Hospice Care. Please advise and thank you.

## 2017-12-13 NOTE — Telephone Encounter (Signed)
Please ask [facility] to perform the study even if they loss money doing so. Or we will try and find another provider.

## 2017-12-14 NOTE — Telephone Encounter (Signed)
I contacted the patient's hospice nurse, Archie Pattenonya. I left a voicemail requesting a call back to find out how we can get the study performed.

## 2017-12-15 NOTE — Telephone Encounter (Signed)
Michelle Morton called back and advised that Hospice does not perform overnight oximetry studies. She did give us the number for her weekly field nurse Michelle Shiveronya Chapman [phone]. I am going to send referral to [employer] and see if they will handle this.

## 2017-12-15 NOTE — Telephone Encounter (Signed)
I have faxed order to [employer] and will await further information from them. If nothing is heard before end of day tomorrow then I will follow up on Monday.

## 2017-12-19 NOTE — Telephone Encounter (Signed)
I left a detailed message with Jeanella FlatteryMatt Lewis ([employer] rep for that specific test) @ [phone].

## 2017-12-21 NOTE — Telephone Encounter (Signed)
[facility] He states that the computer system was down for a bit. He verified receiving the order. There has been a message left for the patient's daughter to call back to set up delivery of the device.

## 2017-12-23 ENCOUNTER — Emergency Department (HOSPITAL_COMMUNITY): Payer: Medicare Other

## 2017-12-23 ENCOUNTER — Emergency Department (HOSPITAL_COMMUNITY)
Admission: EM | Admit: 2017-12-23 | Discharge: 2017-12-23 | Disposition: A | Discharge status: Home / Self Care | Payer: Medicare Other | Attending: Emergency Medicine | Admitting: Emergency Medicine

## 2017-12-23 ENCOUNTER — Encounter (HOSPITAL_COMMUNITY): Payer: Self-pay | Admitting: Emergency Medicine

## 2017-12-23 ENCOUNTER — Other Ambulatory Visit: Payer: Self-pay

## 2017-12-23 DIAGNOSIS — Z87891 Personal history of nicotine dependence: Secondary | ICD-10-CM | POA: Insufficient documentation

## 2017-12-23 DIAGNOSIS — I5032 Chronic diastolic (congestive) heart failure: Secondary | ICD-10-CM | POA: Diagnosis not present

## 2017-12-23 DIAGNOSIS — R0602 Shortness of breath: Secondary | ICD-10-CM | POA: Diagnosis present

## 2017-12-23 DIAGNOSIS — E1122 Type 2 diabetes mellitus with diabetic chronic kidney disease: Secondary | ICD-10-CM | POA: Diagnosis not present

## 2017-12-23 DIAGNOSIS — E039 Hypothyroidism, unspecified: Secondary | ICD-10-CM | POA: Diagnosis not present

## 2017-12-23 DIAGNOSIS — N184 Chronic kidney disease, stage 4 (severe): Secondary | ICD-10-CM | POA: Insufficient documentation

## 2017-12-23 DIAGNOSIS — Z7984 Long term (current) use of oral hypoglycemic drugs: Secondary | ICD-10-CM | POA: Insufficient documentation

## 2017-12-23 DIAGNOSIS — Z79899 Other long term (current) drug therapy: Secondary | ICD-10-CM | POA: Insufficient documentation

## 2017-12-23 DIAGNOSIS — I130 Hypertensive heart and chronic kidney disease with heart failure and stage 1 through stage 4 chronic kidney disease, or unspecified chronic kidney disease: Secondary | ICD-10-CM | POA: Diagnosis not present

## 2017-12-23 DIAGNOSIS — J189 Pneumonia, unspecified organism: Secondary | ICD-10-CM | POA: Insufficient documentation

## 2017-12-23 DIAGNOSIS — Z96653 Presence of artificial knee joint, bilateral: Secondary | ICD-10-CM | POA: Insufficient documentation

## 2017-12-23 LAB — CBC WITH DIFFERENTIAL/PLATELET
Abs Immature Granulocytes: 0 10*3/uL (ref 0.0–0.1)
BASOS ABS: 0 10*3/uL (ref 0.0–0.1)
BASOS PCT: 0 %
Eosinophils Absolute: 0.3 10*3/uL (ref 0.0–0.7)
Eosinophils Relative: 5 %
HEMATOCRIT: 37.3 % (ref 36.0–46.0)
Hemoglobin: 11.1 g/dL — ABNORMAL LOW (ref 12.0–15.0)
IMMATURE GRANULOCYTES: 1 %
Lymphocytes Relative: 17 %
Lymphs Abs: 1.1 10*3/uL (ref 0.7–4.0)
MCH: 29.4 pg (ref 26.0–34.0)
MCHC: 29.8 g/dL — AB (ref 30.0–36.0)
MCV: 98.7 fL (ref 78.0–100.0)
MONOS PCT: 8 %
Monocytes Absolute: 0.5 10*3/uL (ref 0.1–1.0)
NEUTROS ABS: 4.8 10*3/uL (ref 1.7–7.7)
NEUTROS PCT: 69 %
Platelets: 239 10*3/uL (ref 150–400)
RBC: 3.78 MIL/uL — AB (ref 3.87–5.11)
RDW: 14.6 % (ref 11.5–15.5)
WBC: 6.9 10*3/uL (ref 4.0–10.5)

## 2017-12-23 LAB — I-STAT CHEM 8, ED
BUN: >140 mg/dL — ABNORMAL HIGH (ref 8–23)
CALCIUM ION: 1 mmol/L — AB (ref 1.15–1.40)
CREATININE: 2.8 mg/dL — AB (ref 0.44–1.00)
Chloride: 100 mmol/L (ref 98–111)
GLUCOSE: 297 mg/dL — AB (ref 70–99)
HCT: 37 % (ref 36.0–46.0)
HEMOGLOBIN: 12.6 g/dL (ref 12.0–15.0)
POTASSIUM: 4.3 mmol/L (ref 3.5–5.1)
Sodium: 136 mmol/L (ref 135–145)
TCO2: 25 mmol/L (ref 22–32)

## 2017-12-23 LAB — BRAIN NATRIURETIC PEPTIDE: B Natriuretic Peptide: 129.2 pg/mL — ABNORMAL HIGH (ref 0.0–100.0)

## 2017-12-23 LAB — TROPONIN I: Troponin I: <0.03 ng/mL (ref <0.03)

## 2017-12-23 MED ORDER — LEVOFLOXACIN 500 MG PO TABS: 500.0000 mg | ORAL_TABLET | ORAL | 0 refills | Status: AC

## 2017-12-23 MED ORDER — ALBUTEROL (5 MG/ML) CONTINUOUS INHALATION SOLN
10.0000 mg/h | INHALATION_SOLUTION | RESPIRATORY_TRACT | Status: AC
  Administered 2017-12-23: 10 mg/h via RESPIRATORY_TRACT
  Filled 2017-12-23: qty 20

## 2017-12-23 MED ORDER — LEVOFLOXACIN 500 MG PO TABS
500.0000 mg | ORAL_TABLET | Freq: Once | ORAL | Status: AC
  Administered 2017-12-23: 500 mg via ORAL
  Filled 2017-12-23: qty 1

## 2017-12-23 MED ORDER — ACETAMINOPHEN 500 MG PO TABS
1000.0000 mg | ORAL_TABLET | Freq: Once | ORAL | Status: AC
  Administered 2017-12-23: 1000 mg via ORAL
  Filled 2017-12-23: qty 2

## 2017-12-23 MED ORDER — DICLOFENAC SODIUM 1 % TD GEL: 4.0000 g | Freq: Four times a day (QID) | TRANSDERMAL | 0 refills | Status: AC

## 2017-12-23 NOTE — ED Triage Notes (Signed)
Pt to ED via [facility] from home with c/o shortness of breath-- started this am, arrived on C-pap-- removed on arrival, placed on 3 L/m/[place]. Pt has moist sounding cough- wheezes audible- pt states that she has stage 4 kidney disease, COPD, and wears O2 as needed when at home.

## 2017-12-23 NOTE — Discharge Instructions (Addendum)
Your testing today shows that you have chronic kidney failure, your chest x-ray shows that you have a very early pneumonia.  We have given you a dose of levofloxacin, this medication should be taken every other day for 3 doses, your next dose will be on Sunday.  Please pick the medication up today.  You may use topical Voltaren gel for the neck pain on the left side.  You may return to the emergency department for any severe or worsening symptoms.  Please contact your family doctor or hospice services to let them know what has been going on and to arrange close follow-up within 48 hours.

## 2017-12-23 NOTE — ED Notes (Signed)
Pt dressed and awaiting discharge paperwork.  

## 2017-12-23 NOTE — Progress Notes (Signed)
Pt arrived on EMS CPAP. RT assessed and placed pt on 3L [place] per MD. VS stable and pt looks comfortable. Audible wheezes throughout. Awaiting new orders.

## 2017-12-23 NOTE — ED Provider Notes (Signed)
[facility] EMERGENCY DEPARTMENT Provider Note   CSN: [id number] Arrival date & time: 12/23/17  1019     History   Chief Complaint Chief Complaint  Patient presents with  . Shortness of Breath    HPI Michelle Morton is a 84 y.o. female.  HPI  84 year old female, presents with a complaint of shortness of breath, she does have known history of congestive heart failure, COPD, diabetes and has end-stage renal disease but has refused dialysis.  She is under the care of hospice.  This morning the patient woke up with some discomfort in her chest, she was having chest pain which was sharp and worse with breathing with associated shortness of breath and was found to be dyspneic and hypoxic on exam.  She was given albuterol nebulized treatments which helped with the wheezing after which the paramedics felt as though they heard rales and started BiPAP.  On arrival the patient states that she feels much better, she no longer has her severe shortness of breath, she is still feeling short of breath but not nearly as bad.  She denies fevers, denies increases in leg swelling.  Denies any recent coughing or fevers.  Past Medical History:  Diagnosis Date  . Allergic rhinitis, cause unspecified 02/14/2013  . Arthritis   . Arthus phenomenon   . Asthma   . Diabetes mellitus, type 2 (HCC)   . Hearing loss    right ear, no hearing aid  . History of blood transfusion    [name] - unsure number of units transfused  . Hyperlipidemia   . Hypertension   . Hypothyroidism   . Kidney stones   . Osteoarthritis, knee    knees - otc med prn  . SVD (spontaneous vaginal delivery)    x 4  . Varicose veins    lower legs    Patient Active Problem List   Diagnosis Date Noted  . Right shoulder pain 11/18/2017  . Shortness of breath 11/03/2017  . Fall 09/05/2017  . Closed comminuted fracture of right humerus-head and neck 09/05/2017  . Advanced care planning/counseling discussion  09/05/2017  . Fracture 09/05/2017  . Eustachian tube dysfunction, left 08/22/2017  . Palliative care encounter   . Chronic diastolic CHF (congestive heart failure) (HCC) 06/04/2017  . DM (diabetes mellitus), type 2 with renal complications (HCC) 06/04/2017  . Weight gain 06/03/2017  . Acute diastolic CHF (congestive heart failure) (HCC) 05/07/2017  . Bilateral lower extremity edema 05/06/2017  . Osteoporosis 01/26/2017  . Degenerative disc disease, lumbar 12/29/2016  . Neck pain on left side 12/12/2016  . Leg cramps 06/05/2016  . Weakness generalized 03/04/2016  . Hypoglycemia 12/17/2015  . RLS (restless legs syndrome) 12/17/2015  . Weight loss 12/17/2015  . Back pain 10/16/2015  . Right-sided chest pain 03/27/2015  . Diarrhea 03/27/2015  . Hoarseness 03/27/2015  . Loss of weight 03/27/2015  . Depression 01/07/2015  . Chronic kidney disease (CKD), stage IV (severe) (HCC) 01/07/2015  . AKI (acute kidney injury) (HCC) 10/09/2014  . Uterine prolaps 09/01/2013  . Peripheral edema 02/14/2013  . Allergic rhinitis 02/14/2013  . Obesity, Class II, BMI 35-39.9 05/12/2011  . Preventative health care 05/12/2011  . TINNITUS 01/09/2009  . Essential hypertension 05/05/2007  . Hypothyroidism 02/28/2007  . Diabetes (HCC) 02/28/2007  . Hyperlipidemia 02/28/2007  . Osteoarthrosis, unspecified whether generalized or localized, involving lower leg 02/28/2007    Past Surgical History:  Procedure Laterality Date  . ABDOMINAL HYSTERECTOMY    .  ANTERIOR AND POSTERIOR REPAIR N/A 04/17/2014   Procedure: ANTERIOR (CYSTOCELE) AND POSTERIOR REPAIR (RECTOCELE);  Surgeon: Lavina Hamman, MD;  Location: [facility];  Service: Gynecology;  Laterality: N/A;  . APPENDECTOMY    . CATARACT EXTRACTION Bilateral   . COLONOSCOPY    . KIDNEY STONE SURGERY     removal of stone  . REPLACEMENT TOTAL KNEE Bilateral   . TONSILLECTOMY    . WISDOM TOOTH EXTRACTION       OB History   None      Home Medications     Prior to Admission medications   Medication Sig Start Date End Date Taking? Authorizing Provider  acetaminophen (TYLENOL) 325 MG tablet Take 2 tablets (650 mg total) by mouth daily as needed for mild pain, fever or headache. 09/07/17   Marlin Canary U, DO  albuterol (PROVENTIL) (2.5 MG/3ML) 0.083% nebulizer solution Take 3 mLs (2.5 mg total) by nebulization every 4 (four) hours as needed for wheezing or shortness of breath. 10/11/17   [name], MD  budesonide (PULMICORT) 0.5 MG/2ML nebulizer solution Take 2 mLs (0.5 mg total) by nebulization 2 (two) times daily. 12/07/17   Kozlow, Alvira Philips, MD  cetirizine (ZYRTEC) 10 MG tablet Take 10 mg by mouth daily as needed for allergies.     [provider]  diclofenac sodium (VOLTAREN) 1 % GEL Apply 4 g topically 4 (four) times daily. 12/23/17   Eber Hong, MD  docusate sodium (COLACE) 100 MG capsule Take 1 capsule (100 mg total) by mouth 2 (two) times daily. 09/07/17   Joseph Art, DO  fluticasone (FLONASE) 50 MCG/ACT nasal spray Place 1 spray into both nostrils 2 (two) times daily. 12/07/17   Kozlow, Alvira Philips, MD  furosemide (LASIX) 80 MG tablet 1 tab by mouth in the AM and 1 1/2 tab in the PM per renal 10/19/17   [name], MD  gabapentin (NEURONTIN) 100 MG capsule Take 2 capsules (200 mg total) by mouth at bedtime. 01/26/17   Judi Saa, DO  guaiFENesin (MUCINEX) 600 MG 12 hr tablet Take 1,200 mg by mouth daily.    [provider]  HYDROcodone-acetaminophen (NORCO/VICODIN) 5-325 MG tablet Take 1 tablet by mouth daily as needed for severe pain. 11/18/17   [name], MD  ipratropium-albuterol (DUONEB) 0.5-2.5 (3) MG/3ML SOLN Take 3 mLs by nebulization every 4 (four) hours as needed. 12/09/17   Kozlow, Alvira Philips, MD  levofloxacin (LEVAQUIN) 500 MG tablet Take 1 tablet (500 mg total) by mouth every other day for 3 doses. 12/25/17 12/30/17  Eber Hong, MD  levothyroxine (SYNTHROID, LEVOTHROID) 88 MCG tablet Take 1 tablet (88 mcg  total) by mouth daily. 08/22/17   [name], MD  loratadine (CLARITIN) 10 MG tablet Take 1 tablet (10 mg total) by mouth daily as needed for allergies. 12/07/17   Kozlow, Alvira Philips, MD  methocarbamol (ROBAXIN) 500 MG tablet Take 1 tablet (500 mg total) by mouth every 8 (eight) hours as needed for muscle spasms. 09/07/17   Joseph Art, DO  montelukast (SINGULAIR) 10 MG tablet Take 1 tablet (10 mg total) by mouth at bedtime. 12/07/17   Kozlow, Alvira Philips, MD  Oxymetazoline HCl (NASAL SPRAY NA) Place 1 spray into the nose 2 (two) times daily as needed (allergies).     [provider]  pantoprazole (PROTONIX) 40 MG tablet Take 1 tablet (40 mg total) by mouth 2 (two) times daily. 09/07/17   Joseph Art, DO  ranitidine (  ZANTAC) 150 MG capsule Take 1 capsule (150 mg total) by mouth every evening. 12/07/17   Kozlow, Alvira PhilipsEric J, MD  senna-docusate (SENOKOT-S) 8.6-50 MG tablet Take 1 tablet by mouth at bedtime as needed for mild constipation. 09/07/17   Joseph ArtVann, Jessica U, DO  sitaGLIPtin (JANUVIA) 50 MG tablet Take 1 tablet (50 mg total) by mouth daily. 10/19/17 10/19/18  [name], [name], MD    Family History Family History  Problem Relation Age of Onset  . Diabetes Mother   . Diabetes Father   . Hypertension Father   . Breast cancer Daughter   . Diabetes Brother        x 1  . Diabetes Sister        x 4  . Colon cancer Neg Hx   . Esophageal cancer Neg Hx   . Pancreatic cancer Neg Hx   . Liver disease Neg Hx   . Kidney disease Neg Hx     Social History Social History   Tobacco Use  . Smoking status: Former Smoker    Packs/day: 1.00    Years: 16.00    Pack years: 16.00    Types: Cigarettes    Last attempt to quit: 05/10/1992    Years since quitting: 25.6  . Smokeless tobacco: Never Used  Substance Use Topics  . Alcohol use: No    Alcohol/week: 0.0 oz  . Drug use: No     Allergies   Penicillins   Review of Systems Review of Systems  All other systems reviewed and are  negative.    Physical Exam Updated Vital Signs BP (!) 116/47   Pulse 78   Temp (!) 97.5 F (36.4 C) (Oral)   Resp 17   Ht 5\' 3"  (1.6 m)   Wt 86.2 kg (190 lb)   SpO2 100%   BMI 33.66 kg/m   Physical Exam  Constitutional: She appears well-developed and well-nourished. No distress.  HENT:  Head: Normocephalic and atraumatic.  Mouth/Throat: Oropharynx is clear and moist. No oropharyngeal exudate.  Eyes: Pupils are equal, round, and reactive to light. Conjunctivae and EOM are normal. Right eye exhibits no discharge. Left eye exhibits no discharge. No scleral icterus.  Neck: Normal range of motion. Neck supple. No JVD present. No thyromegaly present.  Cardiovascular: Normal rate, regular rhythm, normal heart sounds and intact distal pulses. Exam reveals no gallop and no friction rub.  No murmur heard. Pulmonary/Chest: Effort normal. No respiratory distress. She has wheezes. She has no rales.  Diffuse wheezing on expiration, no rales, speaks in full sentences  Abdominal: Soft. Bowel sounds are normal. She exhibits no distension and no mass. There is no tenderness.  Musculoskeletal: Normal range of motion. She exhibits no edema or tenderness.  Lymphadenopathy:    She has no cervical adenopathy.  Neurological: She is alert. Coordination normal.  Skin: Skin is warm and dry. No rash noted. No erythema.  Psychiatric: She has a normal mood and affect. Her behavior is normal.  Nursing note and vitals reviewed.    ED Treatments / Results  Labs (all labs ordered are listed, but only abnormal results are displayed) Labs Reviewed  CBC WITH DIFFERENTIAL/PLATELET - Abnormal; Notable for the following components:      Result Value   RBC 3.78 (*)    Hemoglobin 11.1 (*)    MCHC 29.8 (*)    All other components within normal limits  BRAIN NATRIURETIC PEPTIDE - Abnormal; Notable for the following components:   B Natriuretic Peptide 129.2 (*)  All other components within normal limits   I-STAT CHEM 8, ED - Abnormal; Notable for the following components:   BUN >140 (*)    Creatinine, Ser 2.80 (*)    Glucose, Bld 297 (*)    Calcium, Ion 1.00 (*)    All other components within normal limits  TROPONIN I    EKG None  Radiology Dg Chest Port 1 View  Result Date: 12/23/2017 CLINICAL DATA:  Shortness of Breath EXAM: PORTABLE CHEST 1 VIEW COMPARISON:  Oct 21, 2017 FINDINGS: There is patchy infiltrate in the left upper lobe, likely pneumonia. There are scattered areas of scarring elsewhere. Heart size is upper normal with pulmonary vascularity normal. No adenopathy. There is aortic atherosclerosis. Bones are osteoporotic. There is an old fracture of the proximal right humerus with medial displacement of the right humeral shaft compared to the humeral head. There is subluxation in the right shoulder joint in this area, stable. IMPRESSION: Subtle patchy infiltrate felt to represent small focus of pneumonia left upper lobe. Areas of mild scarring elsewhere stable. Stable cardiac silhouette. There is aortic atherosclerosis. Old fracture proximal humerus with subluxation of the right shoulder joint and displacement of fracture fragments, stable. Electronically Signed   By: Bretta Bang III M.D.   On: 12/23/2017 10:52    Procedures Procedures (including critical care time)  Medications Ordered in ED Medications  albuterol (PROVENTIL,VENTOLIN) solution continuous neb (0 mg/hr Nebulization Stopped 12/23/17 1148)  levofloxacin (LEVAQUIN) tablet 500 mg (has no administration in time range)     Initial Impression / Assessment and Plan / ED Course  I have reviewed the triage vital signs and the nursing notes.  Pertinent labs & imaging results that were available during my care of the patient were reviewed by me and considered in my medical decision making (see chart for details).    The patient's exam is remarkable for her pulmonary dysfunction with diffuse wheezing, she will need  further nebulized treatments, chest x-ray to rule out increasing pulmonary edema however given her underlying lung disease I suspect this is related to a combination of CHF and COPD.  She has underlying renal disease and refuses dialysis that she may also have dysfunction secondary to retained fluid etc.  Lab work shows chronically elevated creatinine at 2.8, chronically elevated BUN well over 100, CBC shows no leukocytosis, chronic mild anemia, no abnormalities of the platelets.  Possible pneumonia on the CXR.    The patient is well-appearing, after nebulized treatment she has improved significantly, she will be given Levaquin after discussion with the pharmacist about best treatment for pneumonia in this patient who has a penicillin allergy.  The patient also complains of having some left-sided neck pain, this is when she turns her head to the side, it appears to be the strap muscles of the neck, her tympanic membranes are clear bilaterally.  Topical Voltaren would be a good choice as it would not involve renal impairment   Final Clinical Impressions(s) / ED Diagnoses   Final diagnoses:  Community acquired pneumonia, unspecified laterality    ED Discharge Orders        Ordered    levofloxacin (LEVAQUIN) 500 MG tablet  Every 48 hours     12/23/17 1230    diclofenac sodium (VOLTAREN) 1 % GEL  4 times daily     12/23/17 1230       Eber Hong, MD 12/23/17 1231

## 2017-12-23 NOTE — ED Notes (Signed)
Pt c/o severe left ear pain, into left jaw- tearful

## 2017-12-27 NOTE — Telephone Encounter (Addendum)
I spoke with patient's daughter Elnita MaxwellCheryl and advised on lab results. I also gave her the number to contact [employer] to set up the overnight oximetry at their convenience. She denied receiving the messages that were left, and stated she would call today. She also let me know Marella has recently been diagnosed with pneumonia. Just FYI

## 2018-01-03 ENCOUNTER — Encounter: Payer: Self-pay | Admitting: Allergy and Immunology

## 2018-01-03 ENCOUNTER — Ambulatory Visit (INDEPENDENT_AMBULATORY_CARE_PROVIDER_SITE_OTHER): Admitting: Allergy and Immunology

## 2018-01-03 VITALS — BP 140/72 | HR 76 | Resp 20

## 2018-01-03 DIAGNOSIS — J181 Lobar pneumonia, unspecified organism: Secondary | ICD-10-CM | POA: Diagnosis not present

## 2018-01-03 DIAGNOSIS — J3089 Other allergic rhinitis: Secondary | ICD-10-CM

## 2018-01-03 DIAGNOSIS — K219 Gastro-esophageal reflux disease without esophagitis: Secondary | ICD-10-CM | POA: Diagnosis not present

## 2018-01-03 DIAGNOSIS — J189 Pneumonia, unspecified organism: Secondary | ICD-10-CM

## 2018-01-03 DIAGNOSIS — J449 Chronic obstructive pulmonary disease, unspecified: Secondary | ICD-10-CM

## 2018-01-03 MED ORDER — PANTOPRAZOLE SODIUM 40 MG PO TBEC: 40.0000 mg | DELAYED_RELEASE_TABLET | Freq: Two times a day (BID) | ORAL | Status: AC

## 2018-01-03 MED ORDER — MONTELUKAST SODIUM 10 MG PO TABS: 10.0000 mg | ORAL_TABLET | Freq: Every day | ORAL | 5 refills | Status: AC

## 2018-01-03 NOTE — Progress Notes (Signed)
Follow-up Note  Referring Provider: [name], [name], MD Primary Provider: [name], [name], MD Date of Office Visit: 01/03/2018  Subjective:   Michelle Morton (DOB: 03-17-1932) is a 84 y.o. female who [facility] in re-evaluation of the following:  HPI: Michelle Morton presents to this clinic in evaluation of respiratory tract symptoms assessed during her initial evaluation of 07 December 2017 at which point in time she appeared to have a component of respiratory tract inflammation and LPR.  Apparently at the tail end of July she ended up in the emergency room on 23 December 2017 with a community-acquired pneumonia and after receiving therapy to address this issue she is much better at this point in time.  She apparently was hypoxic with that evaluation and now she has oxygen at home.  Unfortunately, she does not have any portable oxygen so she only uses oxygen at home during the day and nighttime.  Overall she thinks she is doing much better regarding her cough and her throat clearing and the drainage stuck in her throat on her current plan which includes nebulized bronchodilator and steroid as well as therapy directed against reflux.  Allergies as of 01/03/2018      Reactions   Penicillins Rash   Allergic to IV penicillin but tolerates the oral form Has patient had a PCN reaction causing immediate rash, facial/tongue/throat swelling, SOB or lightheadedness with hypotension: no Has patient had a PCN reaction causing severe rash involving mucus membranes or skin necrosis: no Has patient had a PCN reaction that required hospitalization already in the hospital for other procedure  Has patient had a PCN reaction occurring within the last 10 years: no If all of the above answers are "NO", then ma      Medication List      acetaminophen 325 MG tablet Commonly known as:  TYLENOL Take 2 tablets (650 mg total) by mouth daily as needed for mild pain, fever or headache.     albuterol (2.5 MG/3ML) 0.083% nebulizer solution Commonly known as:  PROVENTIL Take 3 mLs (2.5 mg total) by nebulization every 4 (four) hours as needed for wheezing or shortness of breath.   budesonide 0.5 MG/2ML nebulizer solution Commonly known as:  PULMICORT Take 2 mLs (0.5 mg total) by nebulization 2 (two) times daily.   cetirizine 10 MG tablet Commonly known as:  ZYRTEC Take 10 mg by mouth daily as needed for allergies.   diclofenac sodium 1 % Gel Commonly known as:  VOLTAREN Apply 4 g topically 4 (four) times daily.   docusate sodium 100 MG capsule Commonly known as:  COLACE Take 1 capsule (100 mg total) by mouth 2 (two) times daily.   fluticasone 50 MCG/ACT nasal spray Commonly known as:  FLONASE Place 1 spray into both nostrils 2 (two) times daily.   furosemide 80 MG tablet Commonly known as:  LASIX 1 tab by mouth in the AM and 1 1/2 tab in the PM per renal   gabapentin 100 MG capsule Commonly known as:  NEURONTIN Take 2 capsules (200 mg total) by mouth at bedtime.   guaiFENesin 600 MG 12 hr tablet Commonly known as:  MUCINEX Take 1,200 mg by mouth daily.   HYDROcodone-acetaminophen 5-325 MG tablet Commonly known as:  NORCO/VICODIN Take 1 tablet by mouth daily as needed for severe pain.   ipratropium-albuterol 0.5-2.5 (3) MG/3ML Soln Commonly known as:  DUONEB Take 3 mLs by nebulization every 4 (four) hours  as needed.   levothyroxine 88 MCG tablet Commonly known as:  SYNTHROID, LEVOTHROID Take 1 tablet (88 mcg total) by mouth daily.   loratadine 10 MG tablet Commonly known as:  CLARITIN Take 1 tablet (10 mg total) by mouth daily as needed for allergies.   methocarbamol 500 MG tablet Commonly known as:  ROBAXIN Take 1 tablet (500 mg total) by mouth every 8 (eight) hours as needed for muscle spasms.   montelukast 10 MG tablet Commonly known as:  SINGULAIR Take 1 tablet (10 mg total) by mouth at bedtime.   NASAL SPRAY NA Place 1 spray into the nose  2 (two) times daily as needed (allergies).   pantoprazole 40 MG tablet Commonly known as:  PROTONIX Take 1 tablet (40 mg total) by mouth 2 (two) times daily.   ranitidine 150 MG capsule Commonly known as:  ZANTAC Take 1 capsule (150 mg total) by mouth every evening.   senna-docusate 8.6-50 MG tablet Commonly known as:  Senokot-S Take 1 tablet by mouth at bedtime as needed for mild constipation.   sitaGLIPtin 50 MG tablet Commonly known as:  JANUVIA Take 1 tablet (50 mg total) by mouth daily.       Past Medical History:  Diagnosis Date  . Allergic rhinitis, cause unspecified 02/14/2013  . Arthritis   . Arthus phenomenon   . Asthma   . Diabetes mellitus, type 2 (HCC)   . Hearing loss    right ear, no hearing aid  . History of blood transfusion    [name] - unsure number of units transfused  . Hyperlipidemia   . Hypertension   . Hypothyroidism   . Kidney stones   . Osteoarthritis, knee    knees - otc med prn  . SVD (spontaneous vaginal delivery)    x 4  . Varicose veins    lower legs    Past Surgical History:  Procedure Laterality Date  . ABDOMINAL HYSTERECTOMY    . ANTERIOR AND POSTERIOR REPAIR N/A 04/17/2014   Procedure: ANTERIOR (CYSTOCELE) AND POSTERIOR REPAIR (RECTOCELE);  Surgeon: Lavina Hamman, MD;  Location: [facility];  Service: Gynecology;  Laterality: N/A;  . APPENDECTOMY    . CATARACT EXTRACTION Bilateral   . COLONOSCOPY    . KIDNEY STONE SURGERY     removal of stone  . REPLACEMENT TOTAL KNEE Bilateral   . TONSILLECTOMY    . WISDOM TOOTH EXTRACTION      Review of systems negative except as noted in HPI / PMHx or noted below:  Review of Systems  Constitutional: Negative.   HENT: Negative.   Eyes: Negative.   Respiratory: Negative.   Cardiovascular: Negative.   Gastrointestinal: Negative.   Genitourinary: Negative.   Musculoskeletal: Negative.   Skin: Negative.   Neurological: Negative.   Endo/Heme/Allergies: Negative.    Psychiatric/Behavioral: Negative.      Objective:   Vitals:   01/03/18 1045  BP: 140/72  Pulse: 76  Resp: 20  SpO2: 96%          Physical Exam  HENT:  Head: Normocephalic.  Right Ear: Tympanic membrane, external ear and ear canal normal.  Left Ear: Tympanic membrane, external ear and ear canal normal.  Nose: Nose normal. No mucosal edema or rhinorrhea.  Mouth/Throat: Uvula is midline, oropharynx is clear and moist and mucous membranes are normal. No oropharyngeal exudate.  Eyes: Conjunctivae are normal.  Neck: Trachea normal. No tracheal tenderness present. No tracheal deviation present. No thyromegaly present.  Cardiovascular: Normal rate, regular rhythm,  S1 normal, S2 normal and normal heart sounds.  No murmur heard. Pulmonary/Chest: No stridor. No respiratory distress. She has wheezes (Scattered expiratory wheeze left lung field posteriorly). She has no rales.  Musculoskeletal: She exhibits no edema.  Lymphadenopathy:       Head (right side): No tonsillar adenopathy present.       Head (left side): No tonsillar adenopathy present.    She has no cervical adenopathy.  Neurological: She is alert.  Skin: No rash noted. She is not diaphoretic. No erythema. Nails show no clubbing.    Diagnostics:    Spirometry was performed and demonstrated an FEV1 of 0.68 at 44 % of predicted.  The patient had an Asthma Control Test with the following results: ACT Total Score: 11.    Results of the chest x-ray obtained 23 December 2017 identified the following:  There is patchy infiltrate in the left upper lobe, likely pneumonia. There are scattered areas of scarring elsewhere. Heart size is upper normal with pulmonary vascularity normal. No adenopathy.   Blood tests obtained 07 December 2017 identified serum IgG 1572 mg/DL, IgM 52 mg/DL, IgA 409 mg/DL, IgE 811 IU/mL, increased IgE antibodies directed against various pollens prevalent in [place], alpha-1 antitrypsin phenotype MM with  level 142 mg/DL.  Assessment and Plan:   1. COPD with asthma (HCC)   2. Other allergic rhinitis   3. LPRD (laryngopharyngeal reflux disease)   4. Community acquired pneumonia of left upper lobe of lung (HCC)     1.  Continue to Treat and prevent reflux:   A.  Protonix 40 mg twice a day  B.  ranitidine 150 mg in evening  2.  Continue to Treat and prevent Inflammation:   A.  Budesonide 0.5 mg nebulized twice a day  B.  Flonase 1 spray each nostril twice a day  C.  Montelukast 10 mg tablet once a day  3.  If needed:   A.  Loratadine 10 mg 1 tablet 1 time per day  B.  DuoNeb nebulization every 4-6 hours  C. Mucinex   4.  Continue 2 L [place] oxygen all day and night  5. Obtain nocturnal oximetry off oxygen  6.  Return to clinic in 4 weeks or earlier if problem  Jameshia appears to be doing very well now that she has been treated appropriately for community-acquired pneumonia with Levaquin.  She does have some inflammation in her airway but this is probably postinfectious and were just going to have her continue to utilize a collection of anti-inflammatory agents for respiratory tract and therapy directed against reflux as noted above and see what happens over the course of the next 4 weeks.  Her oxygen saturation today was quite good at 96% on room air and we will see whether or not there is a requirement for her to use oxygen at nighttime by obtaining nocturnal oximetry.  Laurette Schimke, MD Allergy / Immunology [facility]

## 2018-01-03 NOTE — Patient Instructions (Addendum)
  1.  Continue to Treat and prevent reflux:   A.  Protonix 40 mg twice a day  B.  ranitidine 150 mg in evening  2.  Continue to Treat and prevent Inflammation:   A.  Budesonide 0.5 mg nebulized twice a day  B.  Flonase 1 spray each nostril twice a day  C.  Montelukast 10 mg tablet once a day  3.  If needed:   A.  Loratadine 10 mg 1 tablet 1 time per day  B.  DuoNeb nebulization every 4-6 hours  C. Mucinex   4.  Continue 2 L [place] oxygen all day and night  5. Obtain nocturnal oximetry off oxygen  6.  Return to clinic in 4 weeks or earlier if problem

## 2018-01-04 ENCOUNTER — Telehealth: Payer: Self-pay | Admitting: Internal Medicine

## 2018-01-04 ENCOUNTER — Encounter: Payer: Self-pay | Admitting: Allergy and Immunology

## 2018-01-04 NOTE — Telephone Encounter (Signed)
Sure, ok to drop off and I can sign, thanks

## 2018-01-04 NOTE — Telephone Encounter (Signed)
Copied from CRM [phone]. Topic: General - Other >> Jan 04, 2018 10:58 AM Leafy Roobinson, Norma J wrote: Reason for CRM: pt daughter has a DNR form and would like to know if she can  fill out form and drop it off for  dr Jonny Ruizjohn  to sign, are does her mother needs an appointment to complete form. Pt has an appt in sept and daughter does not want to wait until sept appt  Please advise - LOV 11/18/17

## 2018-01-04 NOTE — Telephone Encounter (Signed)
Unfortunately I am confused as pt is not on metoprolol prior to 7/26 visit, and was tx with antibiotic and voltaren gel at the ED visit  I am not sure if this needs to be refilled, unless we can clarify with family and/or pharmacy

## 2018-01-04 NOTE — Telephone Encounter (Signed)
Copied from CRM [phone]. Topic: General - Other >> Jan 04, 2018 10:48 AM [name], Michelle J wrote: Reason for CRM:pt daughter [name] is calling he mother ran out of metoprolol 25 mg and she would like to know if she should have medication refill . This medication was [facility] to be [facility]ill 01-25-18.

## 2018-01-05 NOTE — Telephone Encounter (Signed)
Daughter has been informed, she will bring the paper work by today.

## 2018-01-05 NOTE — Telephone Encounter (Signed)
Patient has been informed.

## 2018-01-09 ENCOUNTER — Telehealth: Payer: Self-pay | Admitting: Internal Medicine

## 2018-01-09 NOTE — Telephone Encounter (Signed)
Pt's Dtr, Tora DuckSheryl, dropped off MOST form for completion & a DNR.  Please call Sheryl once forms are ready for pick up at [phone] (home) or at [phone] (cell).  Forms put in Dr. Raphael GibneyJohn's box for pick up & completion.

## 2018-01-09 NOTE — Telephone Encounter (Signed)
Forms given to PCP

## 2018-01-10 ENCOUNTER — Telehealth: Payer: Self-pay | Admitting: Internal Medicine

## 2018-01-10 NOTE — Telephone Encounter (Signed)
Called Shavonne, LVM with side b fax number to fax orders over directly to me.

## 2018-01-10 NOTE — Telephone Encounter (Signed)
Copied from CRM [phone]. Topic: Inquiry >> Jan 10, 2018  3:50 PM Terisa Starraylor, Brittany L wrote: Reason for CRM: Vito Berger[facility]avonne [facility] [place] called and said that has faxed an order over to Dr Jonny RuizJohn on 6/5, 7/24 and today. She said she is not getting a response back. She is needing these back asap.

## 2018-01-10 NOTE — Telephone Encounter (Signed)
Forms have been completed. Called and left message informing patient's daughter they are ready to be picked up. Given to front desk to scan into chart.

## 2018-01-11 DIAGNOSIS — E8770 Fluid overload, unspecified: Secondary | ICD-10-CM

## 2018-01-11 DIAGNOSIS — I5030 Unspecified diastolic (congestive) heart failure: Secondary | ICD-10-CM | POA: Diagnosis not present

## 2018-01-11 DIAGNOSIS — K219 Gastro-esophageal reflux disease without esophagitis: Secondary | ICD-10-CM

## 2018-01-11 DIAGNOSIS — E1159 Type 2 diabetes mellitus with other circulatory complications: Secondary | ICD-10-CM

## 2018-01-11 DIAGNOSIS — E785 Hyperlipidemia, unspecified: Secondary | ICD-10-CM

## 2018-01-11 DIAGNOSIS — J449 Chronic obstructive pulmonary disease, unspecified: Secondary | ICD-10-CM

## 2018-01-11 DIAGNOSIS — N184 Chronic kidney disease, stage 4 (severe): Secondary | ICD-10-CM

## 2018-01-11 DIAGNOSIS — I10 Essential (primary) hypertension: Secondary | ICD-10-CM

## 2018-01-11 DIAGNOSIS — J301 Allergic rhinitis due to pollen: Secondary | ICD-10-CM

## 2018-01-11 DIAGNOSIS — D631 Anemia in chronic kidney disease: Secondary | ICD-10-CM | POA: Diagnosis not present

## 2018-01-23 ENCOUNTER — Ambulatory Visit: Payer: Self-pay | Admitting: *Deleted

## 2018-01-23 NOTE — Telephone Encounter (Signed)
Pt's daughter Elnita Maxwell'Cheryl' calling to report pt with increased weight gain and edema; #199 Friday, down to #192 presently. States saw hospice MD Friday and increased "Fluid pill." Reports fluid in legs and fett better, puffy from waist up."  Daughter states pt c/o chest tightness this am. Daughter is not with pt presently. NT called pt for further assessment. Pt states "Chest tightness with coughing only." States coughing up yellowish phlegm in mornings. Temp. 98.9  Audible wheezing present; pt states she just put her O2 on and " The wheezing will go away soon." States "A little weaker this morning." Pt has aide coming in at 1400 today. Pt declined appt today; requesting appt tomorrow am with [name] only. First available at 1300 secured. I called daughter back with information; made aware pt declined appt today. Instructed to go to ED if SOB, chest tightness, fever, edema worsen. Daughter states she will call EMS if needed.  Reason for Disposition . [1] Chest pain lasting <= 5 minutes AND [2] NO chest pain or cardiac symptoms now(Exceptions: pains lasting a few seconds)    "Tightness" with coughing  Answer Assessment - Initial Assessment Questions 1. LOCATION: "Where does it hurt?"       Middle, between breasts 2. RADIATION: "Does the pain go anywhere else?" (e.g., into neck, jaw, arms, back)     no 3. ONSET: "When did the chest pain begin?" (Minutes, hours or days)      This am 4. PATTERN "Does the pain come and go, or has it been constant since it started?"  "Does it get worse with exertion?"      With coughing only 5. DURATION: "How long does it last" (e.g., seconds, minutes, hours)     With coughing 6. SEVERITY: "How bad is the pain?"  (e.g., Scale 1-10; mild, moderate, or severe)    - MILD (1-3): doesn't interfere with normal activities     - MODERATE (4-7): interferes with normal activities or awakens from sleep    - SEVERE (8-10): excruciating pain, unable to do any normal activities    "Tightness" 7. CARDIAC RISK FACTORS: "Do you have any history of heart problems or risk factors for heart disease?" (e.g., prior heart attack, angina; high blood pressure, diabetes, being overweight, high cholesterol, smoking, or strong family history of heart disease)     CHF, HTN 8. PULMONARY RISK FACTORS: "Do you have any history of lung disease?"  (e.g., blood clots in lung, asthma, emphysema, birth control pills)     COPD 9. CAUSE: "What do you think is causing the chest pain?"     cough 10. OTHER SYMPTOMS: "Do you have any other symptoms?" (e.g., dizziness, nausea, vomiting, sweating, fever, difficulty breathing, cough)       Wheezing, temp 98.9, SOB, home O2, increased edema  Protocols used: CHEST PAIN-A-AH

## 2018-01-23 NOTE — Telephone Encounter (Signed)
Patient phoned. Stated she wanted to send back Pulse Oximetry b/c insurance nor Hospice would pay for it. Encouraged her to keep it and discuss with [name] at her appointment tomorrow. Reminded her to call 911 if her symptoms worsen.

## 2018-01-24 ENCOUNTER — Encounter: Payer: Self-pay | Admitting: Internal Medicine

## 2018-01-24 ENCOUNTER — Ambulatory Visit (INDEPENDENT_AMBULATORY_CARE_PROVIDER_SITE_OTHER): Payer: Medicare Other | Admitting: Internal Medicine

## 2018-01-24 VITALS — BP 134/78 | HR 77 | Ht 63.0 in | Wt 194.0 lb

## 2018-01-24 DIAGNOSIS — E083299 Diabetes mellitus due to underlying condition with mild nonproliferative diabetic retinopathy without macular edema, unspecified eye: Secondary | ICD-10-CM | POA: Diagnosis not present

## 2018-01-24 DIAGNOSIS — I5032 Chronic diastolic (congestive) heart failure: Secondary | ICD-10-CM

## 2018-01-24 DIAGNOSIS — I10 Essential (primary) hypertension: Secondary | ICD-10-CM

## 2018-01-24 DIAGNOSIS — M25561 Pain in right knee: Secondary | ICD-10-CM | POA: Insufficient documentation

## 2018-01-24 DIAGNOSIS — N184 Chronic kidney disease, stage 4 (severe): Secondary | ICD-10-CM | POA: Diagnosis not present

## 2018-01-24 LAB — POCT GLYCOSYLATED HEMOGLOBIN (HGB A1C)
HbA1c POC (<> result, manual entry): 0 % (ref 4.0–5.6)
HbA1c, POC (controlled diabetic range): 0 % (ref 0.0–7.0)
HbA1c, POC (prediabetic range): 0 % — AB (ref 5.7–6.4)
Hemoglobin A1C: 7.4 % — AB (ref 4.0–5.6)

## 2018-01-24 MED ORDER — HYDROCODONE-ACETAMINOPHEN 5-325 MG PO TABS: 1.0000 | ORAL_TABLET | Freq: Every day | ORAL | 0 refills | Status: AC | PRN

## 2018-01-24 MED ORDER — METHOCARBAMOL 500 MG PO TABS: 500.0000 mg | ORAL_TABLET | Freq: Three times a day (TID) | ORAL | 1 refills | Status: AC | PRN

## 2018-01-24 MED ORDER — IPRATROPIUM-ALBUTEROL 0.5-2.5 (3) MG/3ML IN SOLN: 3.0000 mL | RESPIRATORY_TRACT | 5 refills | Status: AC | PRN

## 2018-01-24 MED ORDER — FUROSEMIDE 80 MG PO TABS: ORAL_TABLET | ORAL | 3 refills | Status: AC

## 2018-01-24 NOTE — Assessment & Plan Note (Signed)
Worsening volume with ESRD, encouraged compliance with lasix as prescribed and should not reduce dosing, for daily wts, call for increased wt by 3-5 lbs

## 2018-01-24 NOTE — Progress Notes (Signed)
Subjective:    Patient ID: Michelle Morton, female    DOB: 1932-02-04, 84 y.o.   MRN: [id number]  HPI  C/o volume increase with legs swelling to the hips, assoc with more difficulty walking, and sob.  Has had less than prescribed lasix per daughter but with more complaints she has been changed back to the prescription dose .  Wt is overall increased.   Pt denies fever, night sweats, loss of appetite, or other constitutional symptoms.  Pt denies chest pain except for mild with cough, but no increased sob or doe, wheezing, orthopnea, PND, increased LE swelling, palpitations, dizziness or syncope.   Pt denies polydipsia, polyuria,  Wt Readings from Last 3 Encounters:  01/24/18 194 lb (88 kg)  12/23/17 190 lb (86.2 kg)  12/07/17 192 lb (87.1 kg)   Past Medical History:  Diagnosis Date  . Allergic rhinitis, cause unspecified 02/14/2013  . Arthritis   . Arthus phenomenon   . Asthma   . Diabetes mellitus, type 2 (HCC)   . Hearing loss    right ear, no hearing aid  . History of blood transfusion    [name] - unsure number of units transfused  . Hyperlipidemia   . Hypertension   . Hypothyroidism   . Kidney stones   . Osteoarthritis, knee    knees - otc med prn  . SVD (spontaneous vaginal delivery)    x 4  . Varicose veins    lower legs   Past Surgical History:  Procedure Laterality Date  . ABDOMINAL HYSTERECTOMY    . ANTERIOR AND POSTERIOR REPAIR N/A 04/17/2014   Procedure: ANTERIOR (CYSTOCELE) AND POSTERIOR REPAIR (RECTOCELE);  Surgeon: Lavina Hamman, MD;  Location: [facility];  Service: Gynecology;  Laterality: N/A;  . APPENDECTOMY    . CATARACT EXTRACTION Bilateral   . COLONOSCOPY    . KIDNEY STONE SURGERY     removal of stone  . REPLACEMENT TOTAL KNEE Bilateral   . TONSILLECTOMY    . WISDOM TOOTH EXTRACTION      reports that she quit smoking about 25 years ago. Her smoking use included cigarettes. She has a 16.00 pack-year smoking history. She has never used smokeless tobacco.  She reports that she does not drink alcohol or use drugs. family history includes Breast cancer in her daughter; Diabetes in her brother, father, mother, and sister; Hypertension in her father. Allergies  Allergen Reactions  . Penicillins Rash    Allergic to IV penicillin but tolerates the oral form Has patient had a PCN reaction causing immediate rash, facial/tongue/throat swelling, SOB or lightheadedness with hypotension: no Has patient had a PCN reaction causing severe rash involving mucus membranes or skin necrosis: no Has patient had a PCN reaction that required hospitalization already in the hospital for other procedure  Has patient had a PCN reaction occurring within the last 10 years: no If all of the above answers are "NO", then ma   Current Outpatient Medications on File Prior to Visit  Medication Sig Dispense Refill  . acetaminophen (TYLENOL) 325 MG tablet Take 2 tablets (650 mg total) by mouth daily as needed for mild pain, fever or headache.    . budesonide (PULMICORT) 0.5 MG/2ML nebulizer solution Take 2 mLs (0.5 mg total) by nebulization 2 (two) times daily. 150 mL 5  . cetirizine (ZYRTEC) 10 MG tablet Take 10 mg by mouth daily as needed for allergies.     Marland Kitchen diclofenac sodium (VOLTAREN) 1 % GEL Apply 4 g topically 4 (  four) times daily. 100 g 0  . docusate sodium (COLACE) 100 MG capsule Take 1 capsule (100 mg total) by mouth 2 (two) times daily. 10 capsule 0  . fluticasone (FLONASE) 50 MCG/ACT nasal spray Place 1 spray into both nostrils 2 (two) times daily. 16 g 5  . gabapentin (NEURONTIN) 100 MG capsule Take 2 capsules (200 mg total) by mouth at bedtime. 60 capsule 3  . guaiFENesin (MUCINEX) 600 MG 12 hr tablet Take 1,200 mg by mouth daily.    Marland Kitchen. levothyroxine (SYNTHROID, LEVOTHROID) 88 MCG tablet Take 1 tablet (88 mcg total) by mouth daily. 90 tablet 0  . loratadine (CLARITIN) 10 MG tablet Take 1 tablet (10 mg total) by mouth daily as needed for allergies. 30 tablet 5  .  montelukast (SINGULAIR) 10 MG tablet Take 1 tablet (10 mg total) by mouth at bedtime. 30 tablet 5  . Oxymetazoline HCl (NASAL SPRAY NA) Place 1 spray into the nose 2 (two) times daily as needed (allergies).     . pantoprazole (PROTONIX) 40 MG tablet Take 1 tablet (40 mg total) by mouth 2 (two) times daily.    . ranitidine (ZANTAC) 150 MG capsule Take 1 capsule (150 mg total) by mouth every evening. 30 capsule 5  . senna-docusate (SENOKOT-S) 8.6-50 MG tablet Take 1 tablet by mouth at bedtime as needed for mild constipation.    . sitaGLIPtin (JANUVIA) 50 MG tablet Take 1 tablet (50 mg total) by mouth daily. 90 tablet 3   No current facility-administered medications on file prior to visit.    Review of Systems  Constitutional: Negative for other unusual diaphoresis or sweats HENT: Negative for ear discharge or swelling Eyes: Negative for other worsening visual disturbances Respiratory: Negative for stridor or other swelling  Gastrointestinal: Negative for worsening distension or other blood Genitourinary: Negative for retention or other urinary change Musculoskeletal: Negative for other MSK pain or swelling Skin: Negative for color change or other new lesions Neurological: Negative for worsening tremors and other numbness  Psychiatric/Behavioral: Negative for worsening agitation or other fatigue All other system neg per pt    Objective:   Physical Exam BP 134/78   Pulse 77   Ht 5\' 3"  (1.6 m)   Wt 194 lb (88 kg)   SpO2 94%   BMI 34.37 kg/m  VS noted,  Constitutional: Pt appears in NAD HENT: Head: NCAT.  Right Ear: External ear normal.  Left Ear: External ear normal.  Eyes: . Pupils are equal, round, and reactive to light. Conjunctivae and EOM are normal Nose: without d/c or deformity Neck: Neck supple. Gross normal ROM Cardiovascular: Normal rate and regular rhythm.   Pulmonary/Chest: Effort normal and breath sounds without rales or wheezing.  Abd:  Soft, NT, ND, + BS, no  organomegaly Neurological: Pt is alert. At baseline orientation, motor grossly intact Skin: Skin is warm. No rashes, other new lesions, diffuse 1-2+ UE and LE edema Psychiatric: Pt behavior is normal without agitation  No other exam findings Lab Results  Component Value Date   WBC 6.9 12/23/2017   HGB 12.6 12/23/2017   HCT 37.0 12/23/2017   PLT 239 12/23/2017   GLUCOSE 297 (H) 12/23/2017   CHOL 175 12/17/2015   TRIG 82.0 12/17/2015   HDL 61.10 12/17/2015   LDLCALC 97 12/17/2015   ALT 15 10/21/2017   AST 17 10/21/2017   NA 136 12/23/2017   K 4.3 12/23/2017   CL 100 12/23/2017   CREATININE 2.80 (H) 12/23/2017   BUN >140 (  H) 12/23/2017   CO2 28 11/18/2017   TSH 3.076 06/04/2017   INR 1.05 09/05/2017   HGBA1C 6.1 12/10/2016   MICROALBUR 4.8 (H) 10/16/2015   POCT glycosylated hemoglobin (Hb A1C)  Order: [id number]  Status:  Final result Visible to patient:  No (Not Released) Dx:  Diabetes mellitus due to underlying c...   Ref Range & Units 13:37 (01/24/18) 36yr ago (12/10/16) 57yr ago (05/07/16) 25yr ago (12/17/15)  Hemoglobin A1C 4.0 - 5.6 % 7.4Abnormal   6.1 R 5.7             Assessment & Plan:

## 2018-01-24 NOTE — Assessment & Plan Note (Signed)
stable overall by history and exam, recent data reviewed with pt, and pt to continue medical treatment as before,  to f/u any worsening symptoms or concerns BP Readings from Last 3 Encounters:  01/24/18 134/78  01/03/18 140/72  12/23/17 125/63

## 2018-01-24 NOTE — Patient Instructions (Addendum)
Ok to continue the lasix at total of 120 mg twice per day  Please take all new medication as prescribed - the robaxin for leg cramps, and hydrocodone as needed for pain  Your A1c was OK today  Please continue all other medications as before, and refills have been done if requested.  Please have the pharmacy call with any other refills you may need.  Please keep your appointments with your specialists as you may have planned  Please return in 3 months, or sooner if needed

## 2018-01-24 NOTE — Assessment & Plan Note (Signed)
To cont f/u with renal, pt now with hospice as declines other HD

## 2018-01-25 ENCOUNTER — Telehealth: Payer: Self-pay | Admitting: Internal Medicine

## 2018-01-25 NOTE — Telephone Encounter (Signed)
FMLA forms to extend for 3 more months for daughter Michelle Morton have been dropped off and discussed during 01/24/18 visit.   Forms have been completed & placed providers box to review and sign.

## 2018-01-25 NOTE — Telephone Encounter (Signed)
Forms have been signed, Faxed, copy sent to scan.   LVM for Sheryl to inform & that the original is ready to be picked up.

## 2018-02-03 ENCOUNTER — Other Ambulatory Visit: Payer: Self-pay | Admitting: Internal Medicine

## 2018-02-07 ENCOUNTER — Ambulatory Visit: Payer: Medicare Other | Admitting: Allergy and Immunology

## 2018-02-08 ENCOUNTER — Telehealth: Payer: Self-pay | Admitting: Internal Medicine

## 2018-02-08 NOTE — Telephone Encounter (Signed)
Insurance has been submitted and verified for Prolia. Patient is responsible for a $0 copay. Due on or after 03/05/2018. Left message for patient to call back to schedule.  Okay to schedule... Visit Note: Prolia ($0 copay - okay to give per [place]) Visit Type: Nurse Provider: Nurse

## 2018-02-08 NOTE — Telephone Encounter (Signed)
Pt's daughter called to let Michelle Morton know that she will speak w/ hospice nurse first and then call office back

## 2018-02-08 NOTE — Telephone Encounter (Signed)
Patient has sent a letter requesting to work a reduced schedule of 6 hours a day Mon to Fri - 10a to 4p & off Sat/Sun.   Dr. Jonny Ruiz has approved this request and forms have been updated and faxed.   Copy sent to patient's daughter Zalina Ruddick.

## 2018-02-13 ENCOUNTER — Encounter: Payer: Self-pay | Admitting: Internal Medicine

## 2018-02-13 NOTE — Telephone Encounter (Incomplete)
Michelle Morton states: She needs Dr Jonny RuizJohn to write a letter directly to her job ([facility]) stating that she needs a permeant change in hours to the M-F 10am to 4pm with Sat and Sunday off schedule, for the duration of her mother's illness, to better care for her mother.   Michelle Morton will pick up when available.  Please call her: [phone].

## 2018-02-13 NOTE — Telephone Encounter (Signed)
Letter has been completed. Michelle Morton has been informed it is ready to be picked up.

## 2018-02-20 ENCOUNTER — Ambulatory Visit: Payer: Medicare Other | Admitting: Internal Medicine

## 2018-02-24 ENCOUNTER — Other Ambulatory Visit: Payer: Self-pay | Admitting: Internal Medicine

## 2018-04-11 ENCOUNTER — Telehealth: Payer: Self-pay

## 2018-04-11 NOTE — Telephone Encounter (Signed)
On 04/11/18 I [facility] a [facility] The d/c is for cremation. The patient is a patient of Doctor Jonny RuizJohn...  The d/c will be taken to Primary care for signature.  On 04/12/18 I received the d/c back from Doctor Jonny RuizJohn. I got the d/c ready and called the funeral home to let them know the d/c is ready for pickup.

## 2018-04-26 ENCOUNTER — Ambulatory Visit: Payer: Medicare Other | Admitting: Internal Medicine

## 2018-04-30 DEATH — deceased

## 2019-02-14 IMAGING — DX DG HUMERUS 2V *R*
2 series · 2 of 2 positions shown · non-contrast
Comparison: None.

CLINICAL DATA: Right shoulder pain after fall today.

EXAM:
RIGHT HUMERUS - 2+ VIEW

[humerus ap]
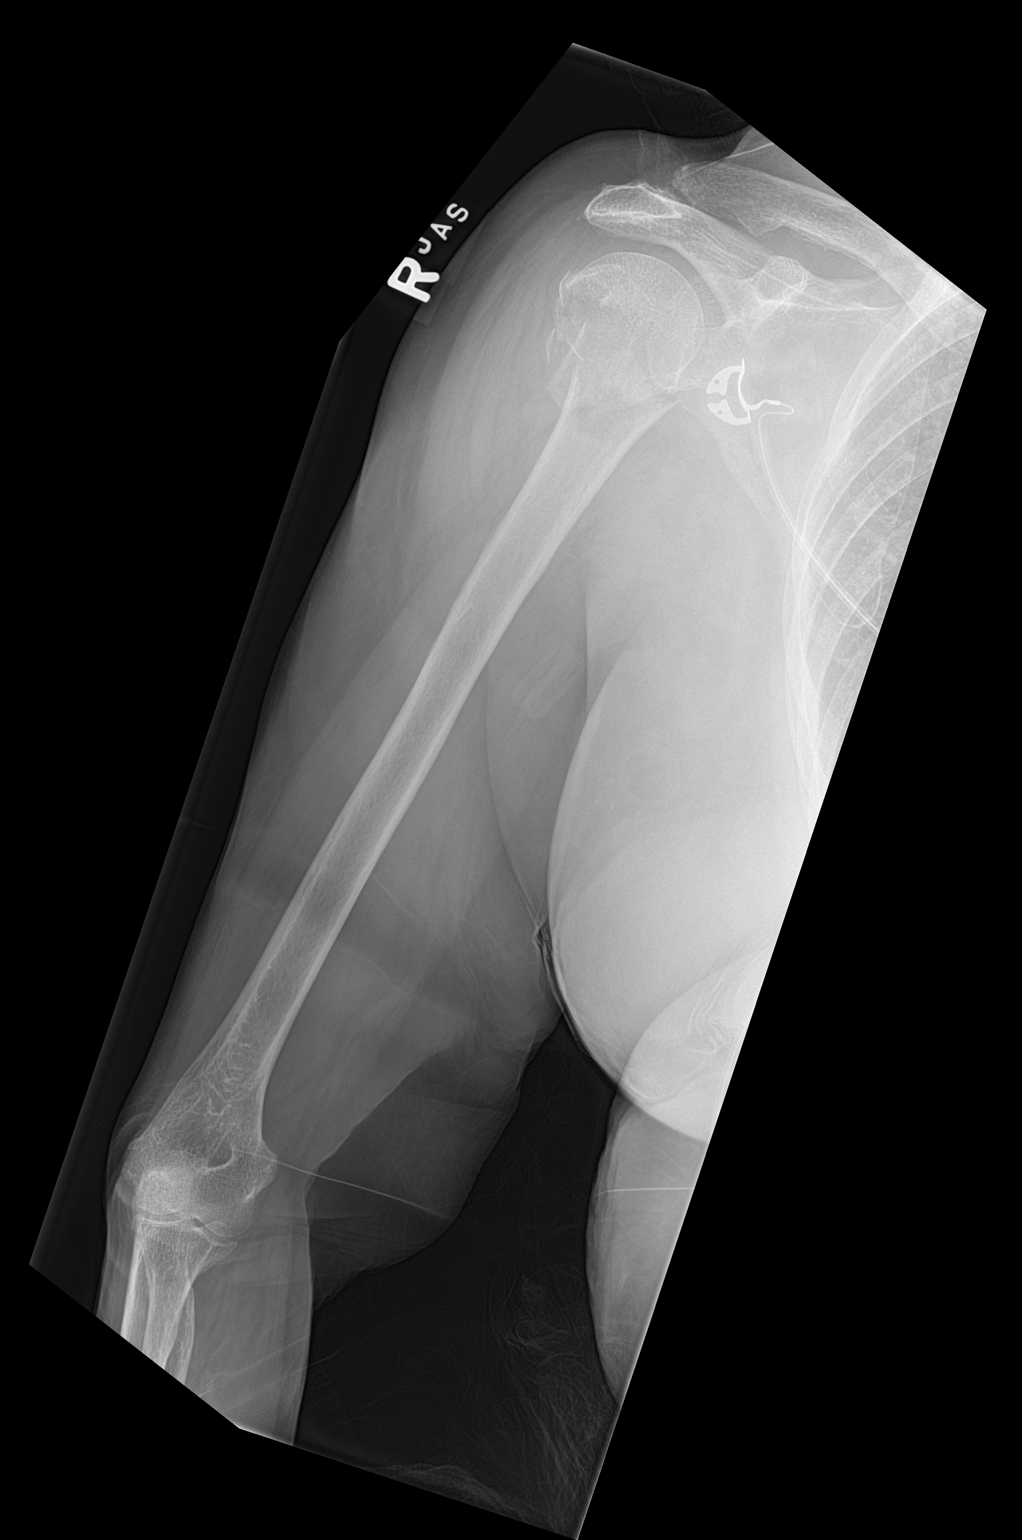

[humerus lat]
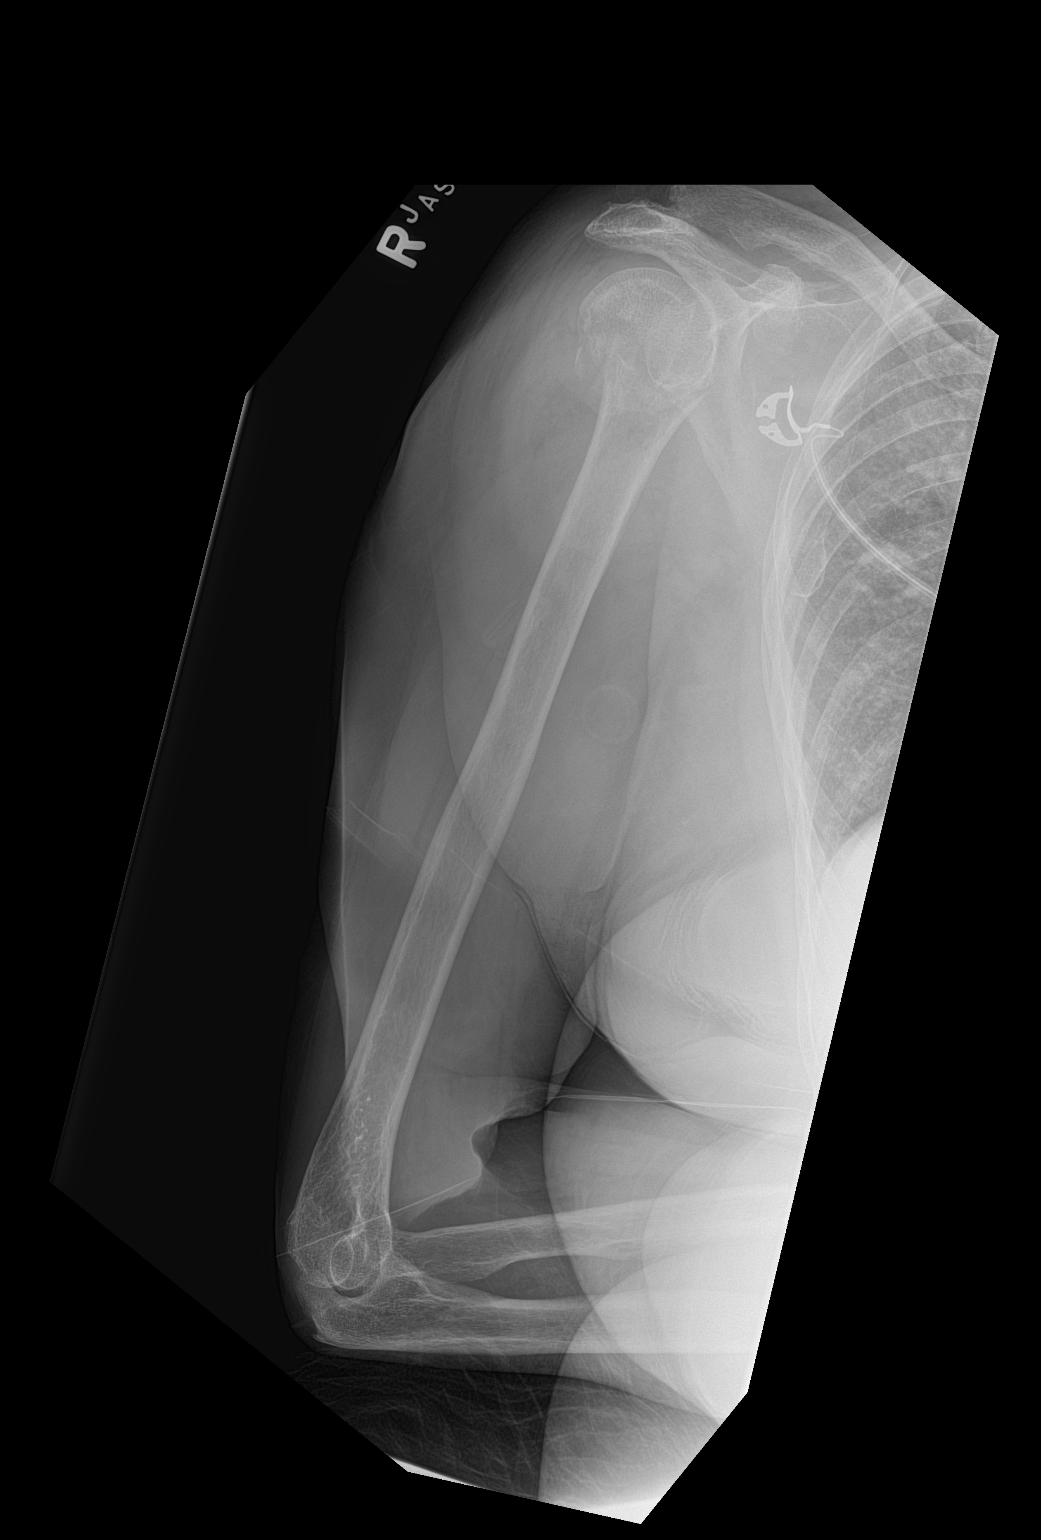

[2 of 2 positions shown; findings below may reference images not displayed]

FINDINGS: Acute, comminuted closed fracture of the right surgical neck of the
humerus and humeral head in the region of the biceps tubercles. No
angulation greater than 45 degrees or displacement greater than 1 cm
identified. No joint dislocation.
IMPRESSION: Comminuted proximal humerus fracture involving the surgical neck and
humeral head without significant angulation or displacement.

## 2019-02-14 IMAGING — DX DG FOREARM 2V*R*
4 series · 4 of 4 positions shown · non-contrast
Comparison: None.

CLINICAL DATA: Right arm pain after slip fall today.

EXAM:
RIGHT FOREARM - 2 VIEW

[forearm ap (1 of 2)]
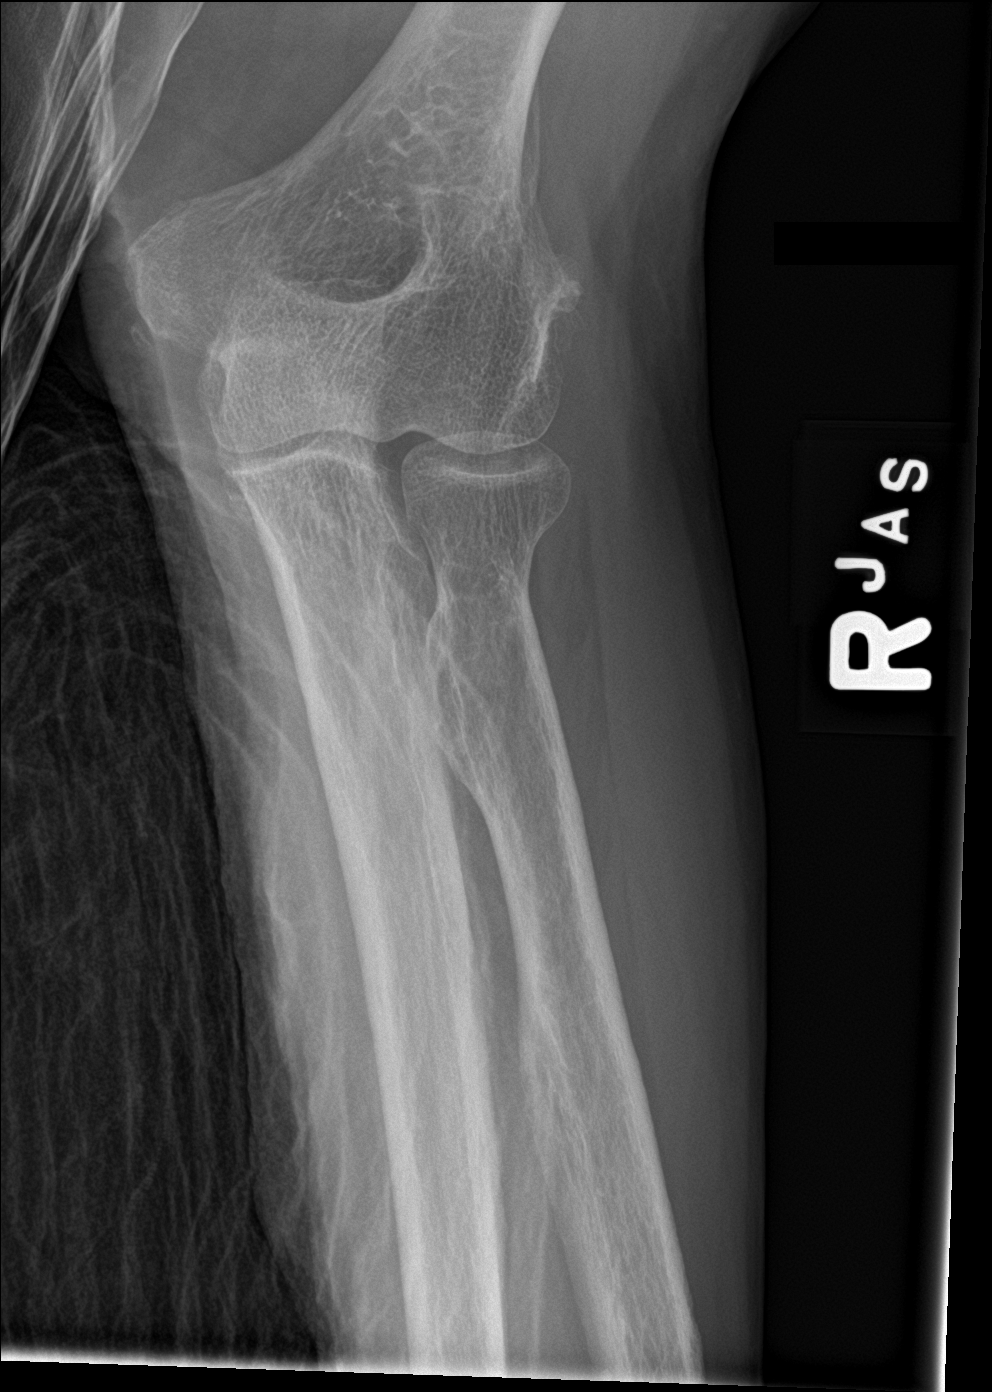

[forearm lat (1 of 2)]
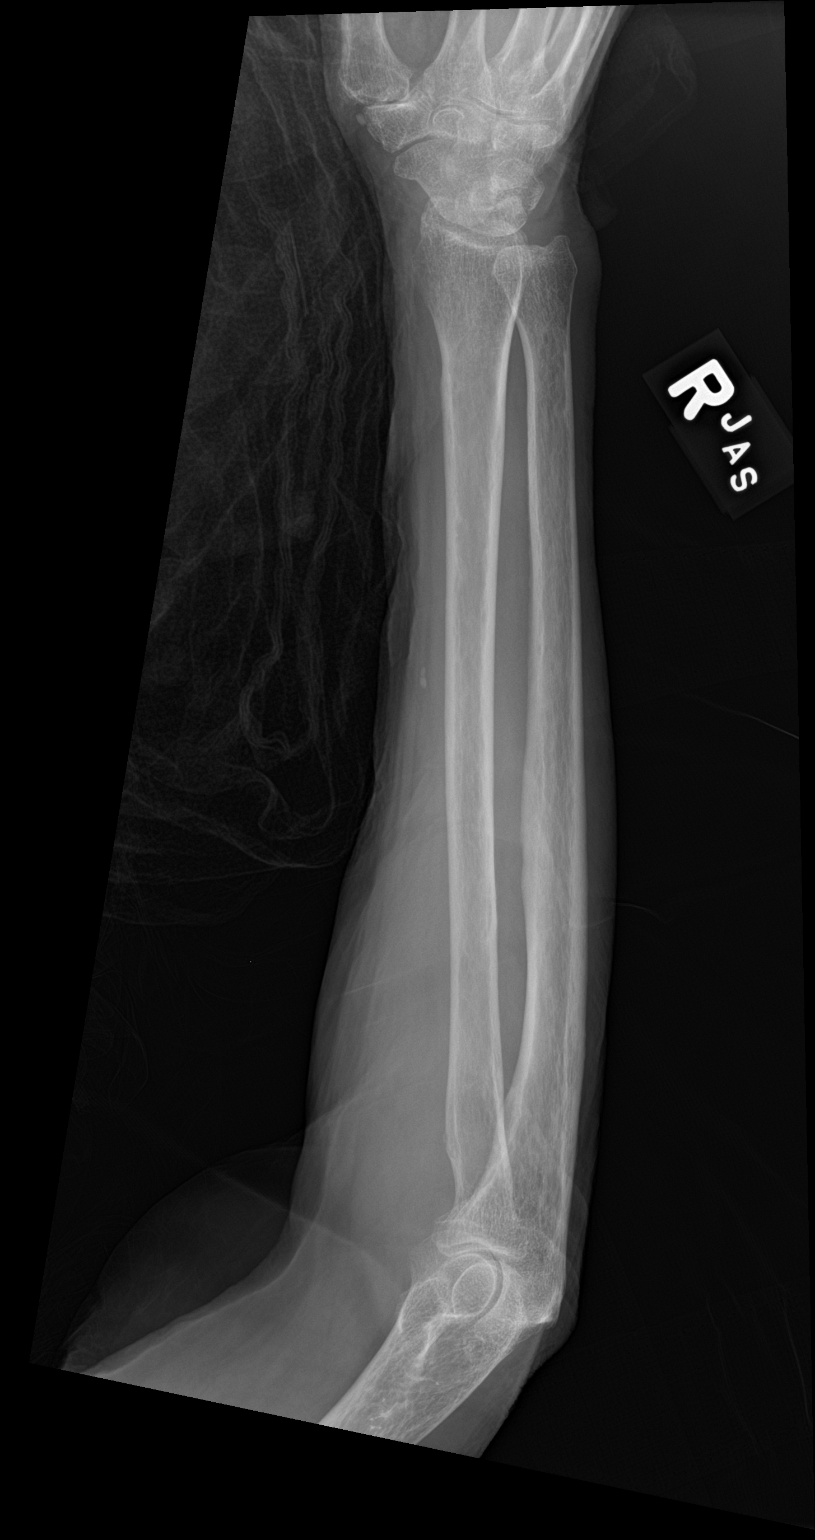

[forearm ap (2 of 2)]
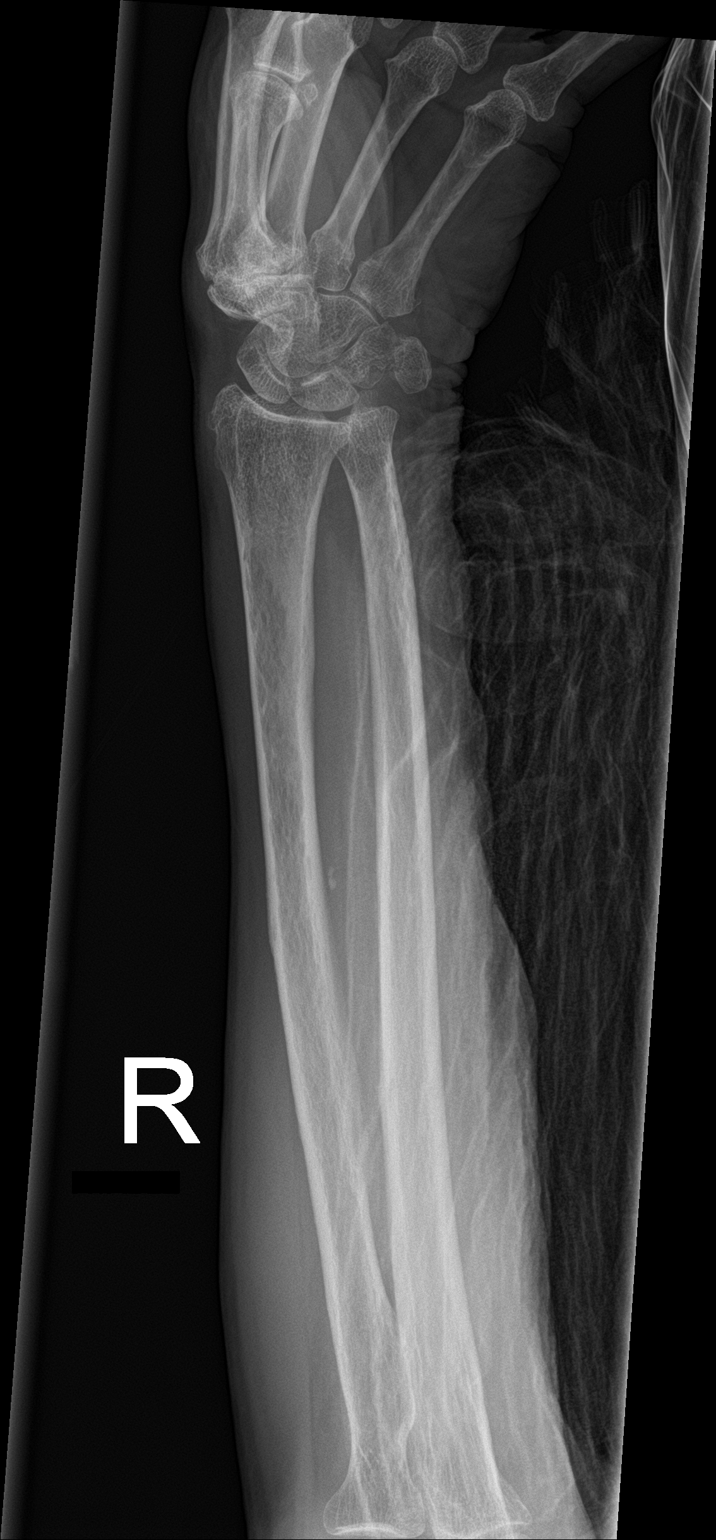

[forearm lat (2 of 2)]
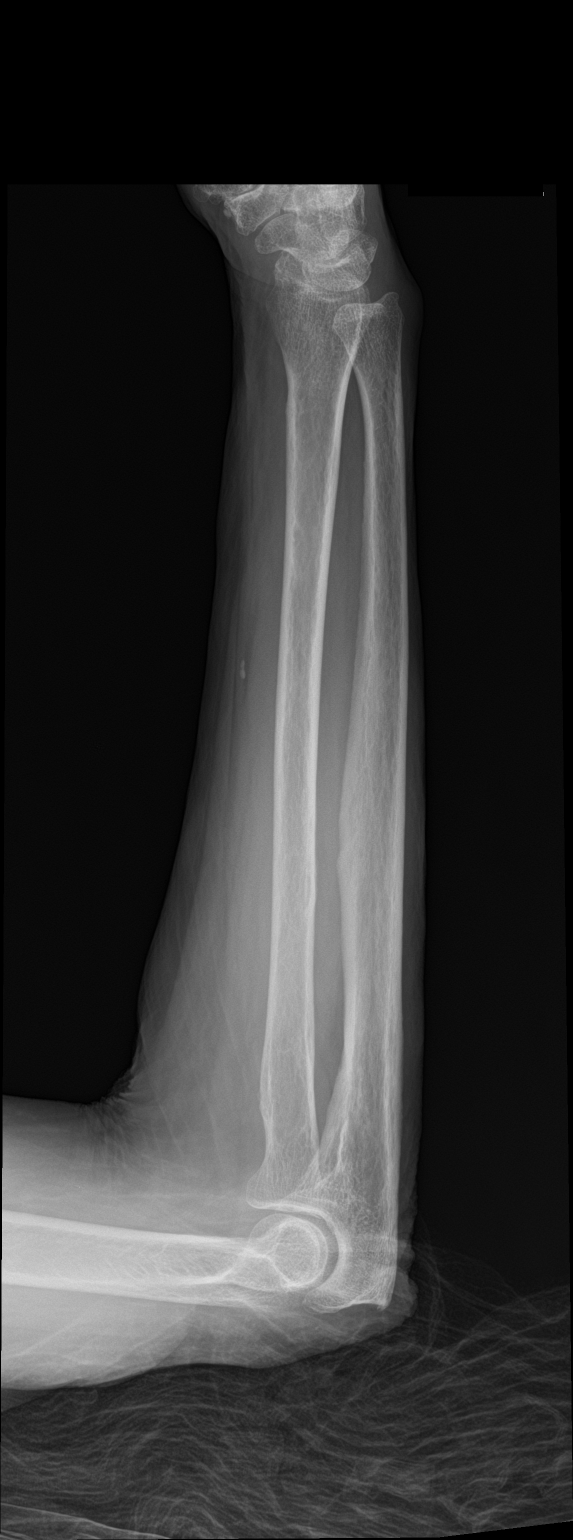

[4 of 4 positions shown; findings below may reference images not displayed]

FINDINGS: No acute fracture of the forearm. Osteoarthritis at the base of the
thumb metacarpal. Carpal rows are maintained. Enthesopathy at the
triceps insertion on the olecranon. No elbow joint effusion. No
radial head fracture.
IMPRESSION: No acute fracture or malalignment of the right forearm and included
wrist. Osteoarthritis at the base of the thumb metacarpal. Spurring
at the triceps insertion on the olecranon.
